# Patient Record
Sex: Female | Born: 1965 | Race: Black or African American | Hispanic: No | Marital: Married | Smoking: Current every day smoker
Health system: Southern US, Community
[De-identification: ages and names within clinical notes are randomized; demographics above are authoritative.]

## PROBLEM LIST (undated history)

## (undated) DIAGNOSIS — K760 Fatty (change of) liver, not elsewhere classified: Secondary | ICD-10-CM

## (undated) DIAGNOSIS — K429 Umbilical hernia without obstruction or gangrene: Secondary | ICD-10-CM

## (undated) DIAGNOSIS — J449 Chronic obstructive pulmonary disease, unspecified: Secondary | ICD-10-CM

## (undated) DIAGNOSIS — M1990 Unspecified osteoarthritis, unspecified site: Secondary | ICD-10-CM

## (undated) DIAGNOSIS — N179 Acute kidney failure, unspecified: Secondary | ICD-10-CM

## (undated) DIAGNOSIS — K219 Gastro-esophageal reflux disease without esophagitis: Secondary | ICD-10-CM

## (undated) DIAGNOSIS — E66813 Obesity, class 3: Secondary | ICD-10-CM

## (undated) DIAGNOSIS — K8050 Calculus of bile duct without cholangitis or cholecystitis without obstruction: Secondary | ICD-10-CM

## (undated) DIAGNOSIS — K5281 Eosinophilic gastritis or gastroenteritis: Secondary | ICD-10-CM

## (undated) DIAGNOSIS — D721 Eosinophilia: Principal | ICD-10-CM

## (undated) DIAGNOSIS — Z87440 Personal history of urinary (tract) infections: Secondary | ICD-10-CM

## (undated) DIAGNOSIS — D3500 Benign neoplasm of unspecified adrenal gland: Secondary | ICD-10-CM

## (undated) DIAGNOSIS — K2970 Gastritis, unspecified, without bleeding: Secondary | ICD-10-CM

## (undated) DIAGNOSIS — K5282 Eosinophilic colitis: Secondary | ICD-10-CM

## (undated) DIAGNOSIS — K269 Duodenal ulcer, unspecified as acute or chronic, without hemorrhage or perforation: Secondary | ICD-10-CM

## (undated) DIAGNOSIS — I10 Essential (primary) hypertension: Secondary | ICD-10-CM

## (undated) HISTORY — DX: Benign neoplasm of unspecified adrenal gland: D35.00

## (undated) HISTORY — DX: Duodenal ulcer, unspecified as acute or chronic, without hemorrhage or perforation: K26.9

## (undated) HISTORY — DX: Fatty (change of) liver, not elsewhere classified: K76.0

## (undated) HISTORY — DX: Gastro-esophageal reflux disease without esophagitis: K21.9

## (undated) HISTORY — DX: Eosinophilia: D72.1

## (undated) HISTORY — DX: Acute kidney failure, unspecified: N17.9

## (undated) HISTORY — DX: Gastritis, unspecified, without bleeding: K29.70

## (undated) HISTORY — DX: Personal history of urinary (tract) infections: Z87.440

## (undated) HISTORY — DX: Calculus of bile duct without cholangitis or cholecystitis without obstruction: K80.50

## (undated) HISTORY — DX: Eosinophilic colitis: K52.82

## (undated) HISTORY — DX: Eosinophilic gastritis or gastroenteritis: K52.81

## (undated) HISTORY — DX: Umbilical hernia without obstruction or gangrene: K42.9

## (undated) HISTORY — PX: ABDOMINAL HYSTERECTOMY: SHX81

---

## 1998-07-27 ENCOUNTER — Ambulatory Visit (HOSPITAL_COMMUNITY): Admission: RE | Admit: 1998-07-27 | Discharge: 1998-07-27 | Payer: Self-pay | Admitting: General Surgery

## 1998-07-27 ENCOUNTER — Encounter (HOSPITAL_BASED_OUTPATIENT_CLINIC_OR_DEPARTMENT_OTHER): Payer: Self-pay | Admitting: General Surgery

## 1998-08-24 ENCOUNTER — Other Ambulatory Visit: Admission: RE | Admit: 1998-08-24 | Discharge: 1998-08-24 | Payer: Self-pay | Admitting: Family Medicine

## 1998-09-01 ENCOUNTER — Ambulatory Visit (HOSPITAL_COMMUNITY): Admission: RE | Admit: 1998-09-01 | Discharge: 1998-09-01 | Payer: Self-pay | Admitting: Family Medicine

## 1998-09-02 ENCOUNTER — Encounter: Payer: Self-pay | Admitting: Family Medicine

## 1999-01-09 HISTORY — PX: OTHER SURGICAL HISTORY: SHX169

## 1999-01-09 HISTORY — PX: CHOLECYSTECTOMY: SHX55

## 1999-07-18 ENCOUNTER — Encounter: Admission: RE | Admit: 1999-07-18 | Discharge: 1999-07-18 | Payer: Self-pay | Admitting: Family Medicine

## 1999-07-18 ENCOUNTER — Encounter: Payer: Self-pay | Admitting: Family Medicine

## 1999-08-28 ENCOUNTER — Ambulatory Visit (HOSPITAL_COMMUNITY): Admission: RE | Admit: 1999-08-28 | Discharge: 1999-08-28 | Payer: Self-pay | Admitting: Family Medicine

## 1999-08-28 ENCOUNTER — Encounter: Payer: Self-pay | Admitting: Family Medicine

## 1999-09-05 ENCOUNTER — Observation Stay (HOSPITAL_COMMUNITY): Admission: RE | Admit: 1999-09-05 | Discharge: 1999-09-07 | Payer: Self-pay | Admitting: General Surgery

## 1999-09-05 ENCOUNTER — Encounter (INDEPENDENT_AMBULATORY_CARE_PROVIDER_SITE_OTHER): Payer: Self-pay

## 1999-09-05 ENCOUNTER — Encounter (HOSPITAL_BASED_OUTPATIENT_CLINIC_OR_DEPARTMENT_OTHER): Payer: Self-pay | Admitting: General Surgery

## 1999-09-06 ENCOUNTER — Encounter: Payer: Self-pay | Admitting: Gastroenterology

## 1999-09-06 ENCOUNTER — Encounter (HOSPITAL_BASED_OUTPATIENT_CLINIC_OR_DEPARTMENT_OTHER): Payer: Self-pay | Admitting: General Surgery

## 2000-05-10 ENCOUNTER — Emergency Department (HOSPITAL_COMMUNITY): Admission: EM | Admit: 2000-05-10 | Discharge: 2000-05-10 | Payer: Self-pay | Admitting: Emergency Medicine

## 2000-05-10 ENCOUNTER — Encounter: Payer: Self-pay | Admitting: Emergency Medicine

## 2003-05-16 ENCOUNTER — Emergency Department (HOSPITAL_COMMUNITY): Admission: EM | Admit: 2003-05-16 | Discharge: 2003-05-16 | Payer: Self-pay | Admitting: Emergency Medicine

## 2006-11-29 ENCOUNTER — Emergency Department (HOSPITAL_COMMUNITY): Admission: EM | Admit: 2006-11-29 | Discharge: 2006-11-29 | Payer: Self-pay | Admitting: Emergency Medicine

## 2009-01-04 ENCOUNTER — Encounter: Admission: RE | Admit: 2009-01-04 | Discharge: 2009-01-04 | Payer: Self-pay | Admitting: Internal Medicine

## 2009-01-06 ENCOUNTER — Encounter: Payer: Self-pay | Admitting: Emergency Medicine

## 2009-04-22 ENCOUNTER — Encounter: Payer: Self-pay | Admitting: Emergency Medicine

## 2009-05-08 HISTORY — PX: OTHER SURGICAL HISTORY: SHX169

## 2009-05-12 ENCOUNTER — Encounter: Payer: Self-pay | Admitting: Emergency Medicine

## 2009-05-18 ENCOUNTER — Ambulatory Visit: Payer: Self-pay | Admitting: Emergency Medicine

## 2009-05-18 DIAGNOSIS — I10 Essential (primary) hypertension: Secondary | ICD-10-CM | POA: Insufficient documentation

## 2009-05-18 DIAGNOSIS — R930 Abnormal findings on diagnostic imaging of skull and head, not elsewhere classified: Secondary | ICD-10-CM | POA: Insufficient documentation

## 2009-05-18 DIAGNOSIS — J45901 Unspecified asthma with (acute) exacerbation: Secondary | ICD-10-CM | POA: Insufficient documentation

## 2009-05-18 DIAGNOSIS — J45909 Unspecified asthma, uncomplicated: Secondary | ICD-10-CM | POA: Insufficient documentation

## 2009-05-18 HISTORY — DX: Unspecified asthma with (acute) exacerbation: J45.901

## 2009-05-20 ENCOUNTER — Ambulatory Visit: Payer: Self-pay | Admitting: Emergency Medicine

## 2009-05-20 ENCOUNTER — Ambulatory Visit: Admission: RE | Admit: 2009-05-20 | Discharge: 2009-05-20 | Payer: Self-pay | Admitting: Emergency Medicine

## 2009-05-26 ENCOUNTER — Ambulatory Visit
Admission: RE | Admit: 2009-05-26 | Discharge: 2009-05-26 | Payer: Self-pay | Source: Home / Self Care | Admitting: Emergency Medicine

## 2009-06-02 ENCOUNTER — Ambulatory Visit: Payer: Self-pay | Admitting: Emergency Medicine

## 2009-06-09 ENCOUNTER — Encounter: Payer: Self-pay | Admitting: Emergency Medicine

## 2009-06-20 ENCOUNTER — Telehealth: Payer: Self-pay | Admitting: Emergency Medicine

## 2009-07-04 ENCOUNTER — Ambulatory Visit: Payer: Self-pay | Admitting: Emergency Medicine

## 2009-07-14 ENCOUNTER — Ambulatory Visit: Payer: Self-pay | Admitting: Internal Medicine

## 2009-07-26 ENCOUNTER — Telehealth: Payer: Self-pay | Admitting: Emergency Medicine

## 2009-08-11 ENCOUNTER — Ambulatory Visit: Payer: Self-pay | Admitting: Emergency Medicine

## 2009-10-20 ENCOUNTER — Telehealth: Payer: Self-pay | Admitting: Emergency Medicine

## 2010-02-07 NOTE — Letter (Signed)
[facility]   Imported By: Sherian Rein 05/23/2009 13:54:50  _____________________________________________________________________  External Attachment:    Type:   Image     Comment:   External Document

## 2010-02-07 NOTE — Progress Notes (Signed)
Summary: returned call  Phone Note Call from Patient Call back at Home Phone [phone]   Caller: Patient Call For: rhonda Summary of Call: pt is returning call from rhonda. says that dr Lassie Demorest "should already have CT" at his office.  Initial call taken by: Tivis Ringer, CNA,  June 20, 2009 3:12 PM  Follow-up for Phone Call        pt states she brought CT Cd-rom to her consult appt on 05-18-09, RB does state he reviewed CT from 05-12-09. Pt states she never got the CD back. I ATC Iris in med records but did not get an answer unable to leave a message. WCB. If Iris does not have copy will have to request another copy of CT from [facility].   Still unable to get anybody in Med records to answer the phone so I called [facility] and requested a new copy be made of CD-ROm. Pt advised to go by and pick up copy before her CT appt on 07-14-09. Carron Curie CMA  June 20, 2009 3:51 PM

## 2010-02-07 NOTE — Progress Notes (Signed)
Summary: pt returniong call  Phone Note Outgoing Call Call back at Home Phone [phone]   Call placed by: Leslye Peer MD,  July 26, 2009 9:35 AM Call placed to: Patient Summary of Call: Called pt at home to discuss CT scan and plans for further eval, left message. Called at work - she was busy, will attempt to call her back. Leslye Peer MD  July 26, 2009 10:04 AM   call home number anytime but tomorrow if you call her at work, just let whoever answers know you are calling from doctors office and they will get her to the telephone. Initial call taken by: Eugene Gavia,  July 26, 2009 3:49 PM  Follow-up for Phone Call        Please insure that pt has f/u appt w me to discuss her CT and next steps in her care. Thanks. RSB Follow-up by: Leslye Peer MD,  August 05, 2009 12:04 PM  Additional Follow-up for Phone Call Additional follow up Details #1::        The patient is sch for f/u with you on Thurs., 08/11/09. Additional Follow-up by: Michel Bickers CMA,  August 05, 2009 12:29 PM

## 2010-02-07 NOTE — Assessment & Plan Note (Signed)
Summary: abnormal CT, asthma   Visit Type:  Initial Consult Copy to:  Dr. Fleet Contras Primary Provider/Referring Provider:  Dr. Fleet Contras  CC:  Pulmonary Consult for possible Sarcoidosis.Marland Kitchen  History of Present Illness: 45 yo woman, former smoker, Hx HTN, referred by Dr Concepcion Elk for dyspnea, frequent bronchitis and an abnormal CT scan of the chest (05/12/09).  Dx with asthma 2009 and treated with ProAir, also samples of Advair - not on this currently. Triggers have included fumes, sometimes activity.   She describes episodes of coughing, usually followed by dyspnea, wheezing.    CT scan from 5/5 reviewed: shows areas of scattered ground glass, mediastinal LAD, no discrete mass.   Preventive Screening-Counseling & Management  Alcohol-Tobacco     Smoking Status: quit  Current Medications (verified): 1)  Proair Hfa 108 (90 Base) Mcg/act Aers (Albuterol Sulfate) .... As Needed 2)  Naproxen 500 Mg Tabs (Naproxen) .Marland Kitchen.. 1 By Mouth Two Times A Day As Needed 3)  Diovan Hct 160-12.5 Mg Tabs (Valsartan-Hydrochlorothiazide) .... Once Daily 4)  Acetaminophen 650 Mg Supp (Acetaminophen) .... As Needed 5)  Vitamin B-12 500 Mcg Tabs (Cyanocobalamin) .... On Ocassion  Allergies (verified): No Known Drug Allergies  Past History:  Past Medical History: ASTHMA (ICD-493.90) HYPERTENSION (ICD-401.9)  Past Surgical History: Cholecystectomy Sept 2001 partial hysterectomy March 1997  Family History: asthma-mother  Social History: Married Patient states former smoker.  Quit March 2011.  1/2 -1 ppd x 53yrs 2 children Works in [employer]  Smoking Status:  quit  Review of Systems       The patient complains of shortness of breath with activity, shortness of breath at rest, productive cough, irregular heartbeats, sore throat, nasal congestion/difficulty breathing through nose, hand/feet swelling, joint stiffness or pain, and change in color of mucus.  The patient denies non-productive cough,  coughing up blood, chest pain, acid heartburn, indigestion, loss of appetite, weight change, abdominal pain, difficulty swallowing, tooth/dental problems, headaches, sneezing, itching, ear ache, anxiety, depression, rash, and fever.    Vital Signs:  Patient profile:   45 year old female Height:      66 inches Weight:      276 pounds BMI:     44.71 O2 Sat:      95 % on Room air Temp:     97.2 degrees F oral Pulse rate:   80 / minute BP sitting:   140 / 72  (right arm) Cuff size:   large  Vitals Entered By: Gweneth Dimitri RN (May 18, 2009 11:45 AM)  O2 Flow:  Room air CC: Pulmonary Consult for possible Sarcoidosis. Comments Medications reviewed with patient Daytime contact number verified with patient. Gweneth Dimitri RN  May 18, 2009 11:52 AM    Physical Exam  General:  Pleasant obese woman, NAD Head:  normocephalic and atraumatic Eyes:  conjunctiva and sclera clear Nose:  no deformity, discharge, inflammation, or lesions Mouth:  no deformity or lesions Neck:  no masses, thyromegaly, or abnormal cervical nodes Lungs:  clear bilaterally to auscultation and percussion Heart:  regular rhythm.  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  Ct scan appearance suggestive, as is recurrent asthma symptoms.  - discussed risks/benefits of FOB, elected to proceed on Friday 5/13. For TBBx's  Orders: Consultation Level IV (16109)  Problem # 2:  ASTHMA (ICD-493.90) - full PFTs  - continue ProAir for now, may decide to expand regimen depending on PFT's, Bx  results - ROV 2 weeks  Medications Added to Medication List This Visit: 1)  Proair Hfa 108 (90 Base) Mcg/act Aers (Albuterol sulfate) .... As needed 2)  Naproxen 500 Mg Tabs (Naproxen) .Marland Kitchen.. 1 by mouth two times a day as needed 3)  Diovan Hct 160-12.5 Mg Tabs (Valsartan-hydrochlorothiazide) .... Once daily 4)  Acetaminophen 650 Mg Supp (Acetaminophen) .... As needed 5)  Vitamin B-12 500 Mcg Tabs (Cyanocobalamin) .... On ocassion  Patient Instructions: 1)  We will perform full PFT's  2)  We will perform bronchoscopy on Friday May 13. Arrive at 11:45 to outpatient admitting at [facility]. Do not eat anything after a light breakfast at 6:00am 3)  Continue your ProAir 2 puffs as needed  4)  Follow up with Dr Delton Coombes in 2 weeks to review your studies.

## 2010-02-07 NOTE — Letter (Signed)
[facility]: [facility]   Imported By: Sherian Rein 05/23/2009 13:53:34  _____________________________________________________________________  External Attachment:    Type:   Image     Comment:   External Document

## 2010-02-07 NOTE — Progress Notes (Signed)
Summary: no show for CT Chest  Phone Note From Other Clinic   Caller: Rose with CT Dept [facility] Call For: Dr. Delton Coombes Summary of Call: [phone] Rose with our CT Dept called to inform you that pt was scheduled for repeat CT Chest today (10/20/09) and was a no show for this appt. Pt was given appt card with date, time and location at the time of her last ov. Rose also calls the day before to remind pt. Just wanted to let you know. Alfonso Ramus  October 20, 2009 12:14 PM   Follow-up for Phone Call        Needs to call her to try to reschedule, thanks Leslye Peer MD  October 21, 2009 10:45 AM  Follow-up by: Leslye Peer MD,  October 21, 2009 10:45 AM  Additional Follow-up for Phone Call Additional follow up Details #1::        lmtcb 10/31/09-11/01/09 and 11/02/09 never reached pt  Additional Follow-up by: Oneita Jolly,  November 01, 2009 12:42 PM

## 2010-02-07 NOTE — Assessment & Plan Note (Signed)
Summary: COPD, abnormal CT, ? sarcoidosis   Visit Type:  Follow-up Copy to:  Dr. Fleet Contras Primary Provider/Referring Provider:  Dr. Fleet Contras  CC:  Patient here for follow-up on bronchoscopy.  BP is elevated today at 180/70. Patient says she has not taken her BP med this morning.Alexandra Bell  History of Present Illness: 45 yo woman, former smoker, Hx HTN, referred by Dr Concepcion Elk for dyspnea, frequent bronchitis and an abnormal CT scan of the chest (05/12/09).  Dx with asthma 2009 and treated with ProAir, also samples of Advair - not on this currently. Triggers have included fumes, sometimes activity.   She describes episodes of coughing, usually followed by dyspnea, wheezing.    CT scan from 5/5 reviewed: shows areas of scattered ground glass, mediastinal LAD, no discrete mass.   ROV 06/02/09 -- returns for eval of asthma symptoms, abnormal CT scan chest. She is s/p FOB with wang needle and TBBx; shows normal epithelium, no sarcoidosis, no malignancy. Cx negative so far. had PFT's done at [facility] last week. She had some cough last week that responded to nebs, mucinex.   Preventive Screening-Counseling & Management  Alcohol-Tobacco     Smoking Status: current     Packs/Day: <0.25  Current Medications (verified): 1)  Proair Hfa 108 (90 Base) Mcg/act Aers (Albuterol Sulfate) .... As Needed 2)  Naproxen 500 Mg Tabs (Naproxen) .Alexandra Bell.. 1 By Mouth Two Times A Day As Needed 3)  Diovan Hct 160-12.5 Mg Tabs (Valsartan-Hydrochlorothiazide) .... Once Daily 4)  Acetaminophen 650 Mg Supp (Acetaminophen) .... As Needed 5)  Vitamin B-12 500 Mcg Tabs (Cyanocobalamin) .... On Ocassion  Allergies (verified): No Known Drug Allergies  Social History: Smoking Status:  current Packs/Day:  <0.25  Vital Signs:  Patient profile:   45 year old female Height:      66 inches (167.64 cm) Weight:      273 pounds (124.09 kg) BMI:     44.22 O2 Sat:      94 % on Room air Temp:     98.6 degrees F (37.00 degrees  C) oral Pulse rate:   88 / minute BP sitting:   180 / 70  (left arm) Cuff size:   large  Vitals Entered By: Michel Bickers CMA (Jun 02, 2009 11:11 AM)  O2 Sat at Rest %:  94 O2 Flow:  Room air CC: Patient here for follow-up on bronchoscopy.  BP is elevated today at 180/70. Patient says she has not taken her BP med this morning. Comments Medications reviewed. Daytime phone verified. Michel Bickers CMA  Jun 02, 2009 11:12 AM   Physical Exam  General:  Pleasant obese woman, NAD Head:  normocephalic and atraumatic Eyes:  conjunctiva and sclera clear Nose:  no deformity, discharge, inflammation, or lesions Mouth:  no deformity or lesions Neck:  no masses, thyromegaly, or abnormal cervical nodes Lungs:  clear bilaterally to auscultation and percussion Heart:  regular rhythm.  Early systolic M 3/6, intact S2 Abdomen:  not examined Msk:  no deformity or scoliosis noted with normal posture Extremities:  trace to 1+ ankle edema Neurologic:  non-focal Skin:  single dark, rasied papule on R hand Psych:  alert and cooperative; normal mood and affect; normal attention span and concentration   Impression & Recommendations:  Problem # 1:  COMPUTERIZED TOMOGRAPHY, CHEST, ABNORMAL (ICD-793.1) Still question sarcoidosis, even with negative TBBx. She has a R hand lesion and L facial lesions that could be sarcoid, may be appropriate to try to bx these  before going straight to surgical bx.  - referral to derm for eval of hand and face lesions.  - trial Symbicort; will need financial assist if we decide to order.  - as needed SABA - repeat Ct scan of the chest in July  - rov 3 weeks  Orders: Est. Patient Level IV (57846) Dermatology Referral [facility]) Radiology Referral (Radiology)  Problem # 2:  ASTHMA (ICD-493.90) PFT's done at [facility] show AFL without BD response, most consistent with COPD + restriction. - trial Symbicort  Patient Instructions: 1)  We will arrange for dermatology evaluation by  Dr Hoyle Sauer of your hand and facial lesions.  2)  Start a trial of Symbicort 2 puffs two times a day. We will order this medication if you find that it helps.  3)  Repeat your CT scan of the chest in the beginning of July 2011.  4)  Follow up with Dr Delton Coombes in 3 weeks to review.  5)  We will give you the pneumovax next visit.   Appended Document: COPD, abnormal CT, ? sarcoidosis Office note faxed to Dr. Hoyle Sauer at [phone].

## 2010-02-07 NOTE — Assessment & Plan Note (Signed)
Summary: abnormal CT   Visit Type:  Follow-up Copy to:  Dr. Fleet Contras Primary Provider/Referring Provider:  Dr. Fleet Contras  CC:  Patient here to discuss CT chest results and treatment plan. Alexandra Bell  History of Present Illness: 45 yo woman, former smoker, Hx HTN, referred by Dr Concepcion Elk for dyspnea, frequent bronchitis and an abnormal CT scan of the chest (05/12/09).  Dx with asthma 2009 and treated with ProAir, also samples of Advair - not on this currently. Triggers have included fumes, sometimes activity.   She describes episodes of coughing, usually followed by dyspnea, wheezing.    CT scan from 5/5 reviewed: shows areas of scattered ground glass, mediastinal LAD, no discrete mass.   ROV 06/02/09 -- returns for eval of asthma symptoms, abnormal CT scan chest. She is s/p FOB with wang needle and TBBx; shows normal epithelium, no sarcoidosis, no malignancy. Cx negative so far. had PFT's done at [facility] last week. She had some cough last week that responded to nebs, mucinex.   ROV 07/04/09 -- returns for eval of her AFL, abnormal CT scan. She underwent skin bx, didn't show sarcoidosis. She did a trial of Symbicort but it didn't seem to help her in any way. Still has ProAir available. Scheduled for repeat CT scan next week.   ROV 08/11/09 -- here to review CT scan from 07/14/09, as below. Still with some GGI, slightly different distribution. Feeling well, no complaints. Cx's from FOB all final and negative.   Current Medications (verified): 1)  Proair Hfa 108 (90 Base) Mcg/act Aers (Albuterol Sulfate) .... As Needed 2)  Tramadol Hcl 50 Mg Tabs (Tramadol Hcl) .Alexandra Bell.. 1 By Mouth Two Times A Day As Needed 3)  Acetaminophen 650 Mg Supp (Acetaminophen) .... As Needed 4)  Vitamin B-12 500 Mcg Tabs (Cyanocobalamin) .... On Ocassion  Allergies (verified): No Known Drug Allergies  Vital Signs:  Patient profile:   45 year old female Height:      66 inches (167.64 cm) Weight:      264 pounds (120.00  kg) BMI:     42.76 O2 Sat:      96 % on Room air Temp:     98.5 degrees F (36.94 degrees C) oral Pulse rate:   87 / minute BP sitting:   142 / 60  (left arm) Cuff size:   large  Vitals Entered By: Michel Bickers CMA (August 11, 2009 1:54 PM)  O2 Flow:  Room air CC: Patient here to discuss CT chest results and treatment plan.  Comments Medications reviewed. Daytime phone verified. Michel Bickers CMA  August 11, 2009 2:00 PM   Physical Exam  General:  Pleasant obese woman, NAD Head:  normocephalic and atraumatic Eyes:  conjunctiva and sclera clear Nose:  no deformity, discharge, inflammation, or lesions Mouth:  no deformity or lesions Neck:  no masses, thyromegaly, or abnormal cervical nodes Lungs:  clear bilaterally to auscultation and percussion Heart:  regular rhythm.  Early systolic M 3/6, intact S2 Abdomen:  not examined Msk:  no deformity or scoliosis noted with normal posture Extremities:  trace to 1+ ankle edema Neurologic:  non-focal Skin:  single dark, rasied papule on R hand Psych:  alert and cooperative; normal mood and affect; normal attention span and concentration   CT of Chest  Procedure date:  07/14/2009  Findings:      Comparison: Chest x-ray dated 05/20/2009   Findings: Diffuse ground glass opacities are seen throughout the lungs most confluent in  the right middle lobe.  No other airspace opacities or pulmonary nodules are seen. There are no pleural effusions. The heart is normal size.  A prominent 10 mm precarinal lymph node is seen.  No other enlarged lymph nodes are present. The visualized portion of thyroid is normal. The upper abdomen is within normal limits.  The patient has had a cholecystectomy. The osseous structures are unremarkable.   IMPRESSION: Multifocal ground-glass opacities throughout the lungs without other associated findings.  This is nonspecific but may relate to , atypical infection such as mycoplasma, hemorrhage,  hypersensitivity or other interstitial pneumonitis.  Impression & Recommendations:  Problem # 1:  COMPUTERIZED TOMOGRAPHY, CHEST, ABNORMAL (ICD-793.1) Ground glass remains present on 7/11 CT scan, different distribution. DDx still includes inflammatory processes esp sarcoidosis, ? infxn but no other symptoms.  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 Still raises question of sarcoidosis. No clinical response to Symbicort.  - continue ProAir as needed   Other Orders: Est. Patient Level IV (04540) Radiology Referral (Radiology)  Patient Instructions: 1)  We will repeat your CT scan of the chest in October 2011 2)  Continue to have your ProAir available to use as needed    Immunization History:  Influenza Immunization History:    Influenza:  historical (10/17/2008)

## 2010-02-07 NOTE — Assessment & Plan Note (Signed)
Summary: abnormal CT scan, AFL   Visit Type:  Follow-up Copy to:  Dr. Fleet Contras Primary Provider/Referring Provider:  Dr. Fleet Contras  CC:  Asthma.  The patient says her breathing has improved. she has been off Symbicort for 1 week and can tell no change.Marland Kitchen  History of Present Illness: 45 yo woman, former smoker, Hx HTN, referred by Dr Concepcion Elk for dyspnea, frequent bronchitis and an abnormal CT scan of the chest (05/12/09).  Dx with asthma 2009 and treated with ProAir, also samples of Advair - not on this currently. Triggers have included fumes, sometimes activity.   She describes episodes of coughing, usually followed by dyspnea, wheezing.    CT scan from 5/5 reviewed: shows areas of scattered ground glass, mediastinal LAD, no discrete mass.   ROV 06/02/09 -- returns for eval of asthma symptoms, abnormal CT scan chest. She is s/p FOB with wang needle and TBBx; shows normal epithelium, no sarcoidosis, no malignancy. Cx negative so far. had PFT's done at [facility] last week. She had some cough last week that responded to nebs, mucinex.   ROV 07/04/09 -- returns for eval of her AFL, abnormal CT scan. She underwent skin bx, didn't show sarcoidosis. She did a trial of Symbicort but it didn't seem to help her in any way. Still has ProAir available. Scheduled for repeat CT scan next week.   Current Medications (verified): 1)  Proair Hfa 108 (90 Base) Mcg/act Aers (Albuterol Sulfate) .... As Needed 2)  Tramadol Hcl 50 Mg Tabs (Tramadol Hcl) .Marland Kitchen.. 1 By Mouth Two Times A Day As Needed 3)  Diovan Hct 160-12.5 Mg Tabs (Valsartan-Hydrochlorothiazide) .... Once Daily 4)  Acetaminophen 650 Mg Supp (Acetaminophen) .... As Needed 5)  Vitamin B-12 500 Mcg Tabs (Cyanocobalamin) .... On Ocassion  Allergies (verified): No Known Drug Allergies  Past History:  Past Medical History: ASTHMA (ICD-493.90) HYPERTENSION (ICD-401.9) Abnormal CT scan of the chest 05/12/09:   - s/p FOB with TBBx's =  unrevealing  - s/p skin bx rash on R hand = unrevealing  Vital Signs:  Patient profile:   45 year old female Height:      66 inches (167.64 cm) Weight:      243 pounds (110.45 kg) BMI:     39.36 O2 Sat:      98 % on Room air Temp:     97.7 degrees F (36.50 degrees C) oral Pulse rate:   84 / minute BP sitting:   130 / 76  (right arm) Cuff size:   large  Vitals Entered By: Michel Bickers CMA (July 04, 2009 4:39 PM)  O2 Sat at Rest %:  98 O2 Flow:  Room air CC: Asthma.  The patient says her breathing has improved. she has been off Symbicort for 1 week and can tell no change. Comments Medications reviewed. Daytime phone verified. Michel Bickers CMA  July 04, 2009 4:46 PM   Physical Exam  General:  Pleasant obese woman, NAD Head:  normocephalic and atraumatic Eyes:  conjunctiva and sclera clear Nose:  no deformity, discharge, inflammation, or lesions Mouth:  no deformity or lesions Neck:  no masses, thyromegaly, or abnormal cervical nodes Lungs:  clear bilaterally to auscultation and percussion Heart:  regular rhythm.  Early systolic M 3/6, intact S2 Abdomen:  not examined Msk:  no deformity or scoliosis noted with normal posture Extremities:  trace to 1+ ankle edema Neurologic:  non-focal Psych:  alert and cooperative; normal mood and affect; normal attention span and concentration  Medications Added to Medication List This Visit: 1)  Tramadol Hcl 50 Mg Tabs (Tramadol hcl) .Marland Kitchen.. 1 by mouth two times a day as needed  Other Orders: Est. Patient Level IV (63149) Pneumococcal Vaccine (70263) Admin 1st Vaccine (78588) Admin 1st Vaccine [facility]) [phone])  Patient Instructions: 1)  Get your CT scan next week as planned.  2)  Dr Delton Coombes will call you with the results of your CT scan to decide our next steps. 3)  Stay off the Symbicort 4)  Keep ProAir available to use as needed  5)  Pneumovax today.    Pneumovax Vaccine    Vaccine Type: Pneumovax    Site: right deltoid    Mfr:  [employer]    Dose: 0.5 ml    Route: IM    Given by: Michel Bickers CMA    Exp. Date: 08/20/2010    Lot #: [id number]    VIS given: 08/06/95 version given July 04, 2009.

## 2010-02-07 NOTE — Consult Note (Signed)
Summary: Hoyle Sauer MD  Hoyle Sauer MD   Imported By: Sherian Rein 06/20/2009 10:21:52  _____________________________________________________________________  External Attachment:    Type:   Image     Comment:   External Document

## 2010-03-28 LAB — FUNGUS CULTURE W SMEAR: Fungal Smear: NONE SEEN

## 2010-03-28 LAB — CULTURE, RESPIRATORY W GRAM STAIN

## 2010-03-28 LAB — AFB CULTURE WITH SMEAR (NOT AT ARMC): Acid Fast Smear: NONE SEEN

## 2010-05-26 NOTE — Procedures (Signed)
[facility]  Patient:    Alexandra Bell, Alexandra Bell                     MRN: [id number] Proc. Date: 09/06/99 Adm. Date:  [id number] Disc. Date: [id number] Attending:  Sonda Primes                           Procedure Report  PROCEDURE:  Endoscopic retrograde cholangiopancreatography with endoscopic sphincterotomy and common bile duct stone removal.  ENDOSCOPIST:  Verlin Grills, M.D.  REFERRING PHYSICIAN:  Luisa Hart L. Lurene Shadow, M.D.  INDICATIONS FOR PROCEDURE:  The patient is a 45 year old female who underwent a laparoscopic cholecystectomy on September 05, 1999, to treat cholecystitis associated with gallstones.  The operative cholangiogram reveals a large common bile duct stone and slight intrahepatic ductal dilation.  I discussed ERCP, endoscopic sphincterotomy, and stone extraction risks with the patient and family including a 5.4% risk of pancreatitis, 2% risk of bleeding, 1% risk of infection, and 0.3% risk of intestinal rupture which would require emergency surgery.  The patient has signed the operative permit.  PREMEDICATION:  Fentanyl 100 mcg, Versed 14 mg, and Glucagon 0.5 mg.  ENDOSCOPE:  Olympus duodenoscope.  DESCRIPTION OF PROCEDURE:  After obtaining informed consent, the patient was placed in the left lateral decubitus position on the fluoroscopy table.  I administered intravenous fentanyl and intravenous Versed to attempt to achieve conscious sedation for the procedure.  Despite receiving 14 mg of Versed and 100 mcg of fentanyl, the patient was somewhat combative throughout the procedure.  The Olympus duodenoscope was passed through the posterior hypopharynx, down the esophagus into the proximal stomach without difficulty.  The pylorus was intubated and the endoscope advanced to the second portion of the duodenum.  Major papilla:  Endoscopically, the major papilla appears normal.  Pancreatic duct:  The pancreatic duct was  injected twice with intravenous contrast, but never completely filled.  The mid-distal pancreatic duct appeared normal.  Cholangiogram:  The sphincterotome was used to selectively cannulate the distal common bile duct.  The intrahepatic ducts and extrahepatic ducts were filled with contrast revealing multiple filling defects in the common bile duct compatible with stones.  An endoscopic sphincterotomy was performed. Multiple stones were removed using the 8 mm balloon catheter and the retrieval of basket.  The procedure was terminated before obtaining a final occlusion cholangiogram due to the patients combativeness and actually biting on the endoscope.  ASSESSMENT:  Multiple common bile duct stones removed after performing an endoscopic sphincterotomy.  The procedure was terminated before a final occlusion cholangiogram could be performed.  I cannot confirm clearance of the biliary tract of stones at this point.  RECOMMENDATIONS:  I will plan to repeat a cholangiogram tomorrow if necessary. I will need to observe the patient carefully for signs of pancreatitis and duodenal perforation secondary to the sphincterotomy. DD:  09/06/99 TD:  09/07/99 Job: [id number]

## 2010-05-26 NOTE — Op Note (Signed)
[facility]  Patient:    Alexandra Bell, Alexandra Bell                     MRN: [id number] Proc. Date: 09/05/99 Adm. Date:  [id number] Attending:  Sonda Primes                           Operative Report  PREOPERATIVE DIAGNOSES:  Chronic calculus cholecystitis.  POSTOPERATIVE DIAGNOSES:  Chronic calculus cholecystitis with choledocholithiasis.  PROCEDURE:  Laparoscopic cholecystectomy with intraoperative cholangiogram.  SURGEON:  Dr. Lurene Shadow.  ASSISTANT:  Dr. Wiliam Ke.  ANESTHESIA:  General.  HISTORY OF PRESENT ILLNESS:  This patient is a 45 year old woman who presented with persistent right upper quadrant pain involving fatty foods associated nausea and with vomiting. She comes to the operating room for laparoscopic cholecystectomy. It is noted that her bilirubin is within normal limits, so is her alkaline phosphatase. Her ALT and AST are moderately elevated. No leukocytosis or fever.  DESCRIPTION OF PROCEDURE:  Following the induction of satisfactory anesthesia with the patient positioned supinely, the abdomen was routinely prepped and draped to be included in the sterile operative field. Open laparoscopy was created at the umbilicus with insufflation of the peritoneal cavity to 14 mmHg pressure using carbon dioxide. Camera inserted and visual exploration of the abdomen carried out. The gallbladder was somewhat elongated and deeply intrahepatic with few adhesions to the gallbladder wall. The edge of the liver was smooth. The edge of the liver was sharp, the liver surface was smooth. The anterior gastric wall and duodenum appeared to be normal. There were multiple adhesions in the pelvis from previous surgery. There was no abnormality seen in any of the small or large intestine viewed. The pelvic organs were not visualized. Under direct vision, epigastric and lateral ports were placed and the gallbladder was grasped and retracted cephalad. Dissection  was carried down to the region of the ampulla of the gallbladder with isolation of a very large cystic duct and this was traced down to the cystic duct common duct junction and back up to the gallbladder cystic duct junction. We elected to take the gallbladder down from above so as to be sure that there was no unusual abnormality in the anatomy. This was carried out by then dissecting along the gallbladder and dissecting the gallbladder free from the liver bed, carrying the dissection down to the region of the cystic artery which was doubly clipped and transected. Again a very large cystic duct was noted. The cystic duct was opened and multiple large stones were mixed out of the cystic duct and removed from the operative field. We then inserted a Reddick catheter into the cystic duct through a 14 gauge Angiocath injecting 1/2 strength Hypaque into the biliary system. The system filled promptly with contrast. The right and left hepatic ducts appeared normal. The common bile duct appeared to be quite dilated and there were multiple filling defects in the common bile duct consistent with stones. We elected to not do a common duct exploration but to approach this through ERCP tomorrow.  The cystic duct catheter was then removed. The cystic duct was first closed with an endoloop and then with a clip. The cystic duct was then transected. The right upper quadrant was thoroughly irrigated with normal saline, all areas of dissection checked for hemostasis and noted to be dry. Sponge, instrument and sharp counts were verified and pneumoperitoneum allowed to deflate. The wounds were closed  in layers as follows: The umbilical wound closed in 2 layers with #0 Dexon and 4-0 Dexon, epigastric and lateral layers closed with 4-0 Dexon and all other wounds reinforced with Steri-Strips. A sterile dressing was applied. Anesthetic reversed, patient removed from the operating room to the recovery room in stable  condition. The patient tolerated the procedure well. DD:  09/05/99 TD:  09/06/99 Job: [id number]

## 2010-11-09 DIAGNOSIS — K5281 Eosinophilic gastritis or gastroenteritis: Secondary | ICD-10-CM

## 2010-11-09 DIAGNOSIS — K760 Fatty (change of) liver, not elsewhere classified: Secondary | ICD-10-CM

## 2010-11-09 HISTORY — DX: Eosinophilic gastritis or gastroenteritis: K52.81

## 2010-11-09 HISTORY — DX: Fatty (change of) liver, not elsewhere classified: K76.0

## 2010-11-27 ENCOUNTER — Inpatient Hospital Stay (HOSPITAL_COMMUNITY)
Admission: EM | Admit: 2010-11-27 | Discharge: 2010-12-01 | DRG: 683 | Disposition: A | Discharge status: Home / Self Care | Payer: PRIVATE HEALTH INSURANCE | Attending: Internal Medicine | Admitting: Internal Medicine

## 2010-11-27 ENCOUNTER — Emergency Department (HOSPITAL_COMMUNITY): Payer: PRIVATE HEALTH INSURANCE

## 2010-11-27 DIAGNOSIS — K859 Acute pancreatitis without necrosis or infection, unspecified: Secondary | ICD-10-CM

## 2010-11-27 DIAGNOSIS — I959 Hypotension, unspecified: Secondary | ICD-10-CM | POA: Diagnosis present

## 2010-11-27 DIAGNOSIS — J45901 Unspecified asthma with (acute) exacerbation: Secondary | ICD-10-CM | POA: Diagnosis not present

## 2010-11-27 DIAGNOSIS — E871 Hypo-osmolality and hyponatremia: Secondary | ICD-10-CM | POA: Diagnosis not present

## 2010-11-27 DIAGNOSIS — I10 Essential (primary) hypertension: Secondary | ICD-10-CM | POA: Diagnosis present

## 2010-11-27 DIAGNOSIS — R6510 Systemic inflammatory response syndrome (SIRS) of non-infectious origin without acute organ dysfunction: Secondary | ICD-10-CM | POA: Diagnosis present

## 2010-11-27 DIAGNOSIS — N390 Urinary tract infection, site not specified: Secondary | ICD-10-CM | POA: Diagnosis present

## 2010-11-27 DIAGNOSIS — N179 Acute kidney failure, unspecified: Principal | ICD-10-CM | POA: Diagnosis present

## 2010-11-27 DIAGNOSIS — D72829 Elevated white blood cell count, unspecified: Secondary | ICD-10-CM | POA: Diagnosis present

## 2010-11-27 DIAGNOSIS — K5281 Eosinophilic gastritis or gastroenteritis: Secondary | ICD-10-CM | POA: Diagnosis present

## 2010-11-27 DIAGNOSIS — R112 Nausea with vomiting, unspecified: Secondary | ICD-10-CM | POA: Diagnosis present

## 2010-11-27 DIAGNOSIS — N289 Disorder of kidney and ureter, unspecified: Secondary | ICD-10-CM

## 2010-11-27 DIAGNOSIS — E860 Dehydration: Secondary | ICD-10-CM | POA: Diagnosis present

## 2010-11-27 DIAGNOSIS — R197 Diarrhea, unspecified: Secondary | ICD-10-CM

## 2010-11-27 DIAGNOSIS — Z7982 Long term (current) use of aspirin: Secondary | ICD-10-CM

## 2010-11-27 DIAGNOSIS — E861 Hypovolemia: Secondary | ICD-10-CM

## 2010-11-27 DIAGNOSIS — J45909 Unspecified asthma, uncomplicated: Secondary | ICD-10-CM | POA: Diagnosis present

## 2010-11-27 DIAGNOSIS — E876 Hypokalemia: Secondary | ICD-10-CM | POA: Diagnosis not present

## 2010-11-27 HISTORY — DX: Essential (primary) hypertension: I10

## 2010-11-27 HISTORY — DX: Acute kidney failure, unspecified: N17.9

## 2010-11-27 HISTORY — DX: Morbid (severe) obesity due to excess calories: E66.01

## 2010-11-27 HISTORY — DX: Obesity, class 3: E66.813

## 2010-11-27 LAB — DIFFERENTIAL
Basophils Absolute: 0 10*3/uL (ref 0.0–0.1)
Basophils Relative: 0 % (ref 0–1)
Eosinophils Absolute: 1 10*3/uL — ABNORMAL HIGH (ref 0.0–0.7)
Eosinophils Relative: 5 % (ref 0–5)
Lymphocytes Relative: 9 % — ABNORMAL LOW (ref 12–46)
Lymphs Abs: 1.8 10*3/uL (ref 0.7–4.0)
Monocytes Absolute: 1.2 10*3/uL — ABNORMAL HIGH (ref 0.1–1.0)
Monocytes Relative: 6 % (ref 3–12)
Neutro Abs: 16.2 10*3/uL — ABNORMAL HIGH (ref 1.7–7.7)
Neutrophils Relative %: 80 % — ABNORMAL HIGH (ref 43–77)

## 2010-11-27 LAB — CBC
HCT: 48.4 % — ABNORMAL HIGH (ref 36.0–46.0)
HCT: 46.3 % — ABNORMAL HIGH (ref 36.0–46.0)
Hemoglobin: 17.1 g/dL — ABNORMAL HIGH (ref 12.0–15.0)
Hemoglobin: 16.7 g/dL — ABNORMAL HIGH (ref 12.0–15.0)
MCH: 31.1 pg (ref 26.0–34.0)
MCH: 31.3 pg (ref 26.0–34.0)
MCHC: 35.3 g/dL (ref 30.0–36.0)
MCHC: 36.1 g/dL — ABNORMAL HIGH (ref 30.0–36.0)
MCV: 88.2 fL (ref 78.0–100.0)
MCV: 86.7 fL (ref 78.0–100.0)
Platelets: 286 10*3/uL (ref 150–400)
Platelets: 325 10*3/uL (ref 150–400)
RBC: 5.49 MIL/uL — ABNORMAL HIGH (ref 3.87–5.11)
RBC: 5.34 MIL/uL — ABNORMAL HIGH (ref 3.87–5.11)
RDW: 13 % (ref 11.5–15.5)
RDW: 13.1 % (ref 11.5–15.5)
WBC: 20.2 10*3/uL — ABNORMAL HIGH (ref 4.0–10.5)
WBC: 21.4 10*3/uL — ABNORMAL HIGH (ref 4.0–10.5)

## 2010-11-27 LAB — COMPREHENSIVE METABOLIC PANEL
ALT: 27 U/L (ref 0–35)
AST: 23 U/L (ref 0–37)
Albumin: 3.6 g/dL (ref 3.5–5.2)
Alkaline Phosphatase: 97 U/L (ref 39–117)
BUN: 34 mg/dL — ABNORMAL HIGH (ref 6–23)
CO2: 21 mEq/L (ref 19–32)
Calcium: 10.1 mg/dL (ref 8.4–10.5)
Chloride: 95 mEq/L — ABNORMAL LOW (ref 96–112)
Creatinine, Ser: 2.92 mg/dL — ABNORMAL HIGH (ref 0.50–1.10)
GFR calc Af Amer: 21 mL/min — ABNORMAL LOW (ref >90)
GFR calc non Af Amer: 18 mL/min — ABNORMAL LOW (ref >90)
Glucose, Bld: 97 mg/dL (ref 70–99)
Potassium: 3.6 mEq/L (ref 3.5–5.1)
Sodium: 136 mEq/L (ref 135–145)
Total Bilirubin: 0.4 mg/dL (ref 0.3–1.2)
Total Protein: 8.5 g/dL — ABNORMAL HIGH (ref 6.0–8.3)

## 2010-11-27 LAB — CLOSTRIDIUM DIFFICILE BY PCR: Toxigenic C. Difficile by PCR: NEGATIVE

## 2010-11-27 LAB — LIPASE, BLOOD: Lipase: 81 U/L — ABNORMAL HIGH (ref 11–59)

## 2010-11-27 LAB — CREATININE, SERUM
Creatinine, Ser: 3.2 mg/dL — ABNORMAL HIGH (ref 0.50–1.10)
GFR calc Af Amer: 19 mL/min — ABNORMAL LOW (ref >90)
GFR calc non Af Amer: 16 mL/min — ABNORMAL LOW (ref >90)

## 2010-11-27 MED ORDER — FENTANYL CITRATE 0.05 MG/ML IJ SOLN
50.0000 ug | Freq: Once | INTRAMUSCULAR | Status: AC
  Administered 2010-11-27: 50 ug via INTRAVENOUS
  Filled 2010-11-27: qty 2

## 2010-11-27 MED ORDER — SODIUM CHLORIDE 0.9 % IV BOLUS (SEPSIS)
1000.0000 mL | Freq: Once | INTRAVENOUS | Status: AC
  Administered 2010-11-27: 1000 mL via INTRAVENOUS

## 2010-11-27 MED ORDER — ENOXAPARIN SODIUM 40 MG/0.4ML ~~LOC~~ SOLN
40.0000 mg | SUBCUTANEOUS | Status: DC
  Administered 2010-11-27 – 2010-11-30 (×4): 40 mg via SUBCUTANEOUS
  Filled 2010-11-27 (×5): qty 0.4

## 2010-11-27 MED ORDER — ONDANSETRON HCL 4 MG PO TABS: 4.0000 mg | ORAL_TABLET | Freq: Four times a day (QID) | ORAL | Status: DC | PRN

## 2010-11-27 MED ORDER — CIPROFLOXACIN HCL 500 MG PO TABS
500.0000 mg | ORAL_TABLET | Freq: Two times a day (BID) | ORAL | Status: DC
  Administered 2010-11-27: 500 mg via ORAL
  Filled 2010-11-27 (×5): qty 1

## 2010-11-27 MED ORDER — SODIUM CHLORIDE 0.9 % IV SOLN
INTRAVENOUS | Status: DC
  Administered 2010-11-27: 17:00:00 via INTRAVENOUS

## 2010-11-27 MED ORDER — ACETAMINOPHEN 325 MG PO TABS
325.0000 mg | ORAL_TABLET | Freq: Four times a day (QID) | ORAL | Status: DC | PRN
  Administered 2010-11-27 – 2010-11-29 (×4): 325 mg via ORAL
  Filled 2010-11-27 (×2): qty 1
  Filled 2010-11-27: qty 2
  Filled 2010-11-27: qty 1

## 2010-11-27 MED ORDER — SODIUM CHLORIDE 0.9 % IV SOLN
INTRAVENOUS | Status: DC
  Administered 2010-11-28 (×3): via INTRAVENOUS

## 2010-11-27 MED ORDER — ONDANSETRON HCL 4 MG/2ML IJ SOLN: 4.0000 mg | Freq: Four times a day (QID) | INTRAMUSCULAR | Status: DC | PRN

## 2010-11-27 MED ORDER — ONDANSETRON HCL 4 MG/2ML IJ SOLN
4.0000 mg | Freq: Once | INTRAMUSCULAR | Status: AC
  Administered 2010-11-27: 4 mg via INTRAVENOUS
  Filled 2010-11-27: qty 2

## 2010-11-27 MED ORDER — METRONIDAZOLE IN NACL 5-0.79 MG/ML-% IV SOLN
500.0000 mg | Freq: Three times a day (TID) | INTRAVENOUS | Status: DC
  Administered 2010-11-27: 500 mg via INTRAVENOUS
  Filled 2010-11-27 (×2): qty 100

## 2010-11-27 MED ORDER — ALBUTEROL SULFATE HFA 108 (90 BASE) MCG/ACT IN AERS
2.0000 | INHALATION_SPRAY | Freq: Four times a day (QID) | RESPIRATORY_TRACT | Status: DC | PRN
  Administered 2010-11-29: 2 via RESPIRATORY_TRACT
  Filled 2010-11-27: qty 6.7

## 2010-11-27 NOTE — ED Notes (Signed)
This patient is unable to urinate at this time.

## 2010-11-27 NOTE — ED Provider Notes (Signed)
History     CSN: [id number] Arrival date & time: 11/27/2010 10:08 AM   First MD Initiated Contact with Patient 11/27/10 1012      Chief Complaint  Patient presents with  . Nausea  . Emesis  . Diarrhea    (Consider location/radiation/quality/duration/timing/severity/associated sxs/prior treatment) Patient is a 45 y.o. female presenting with vomiting and diarrhea. The history is provided by the patient.  Emesis  Associated symptoms include abdominal pain, chills and diarrhea. Pertinent negatives include no headaches.  Diarrhea The primary symptoms include fatigue, abdominal pain, nausea, vomiting and diarrhea. Primary symptoms do not include rash.  The illness is also significant for chills. The illness does not include back pain.   patient has had nausea vomiting Friday. She saw her primary care doctor who started her on Cipro for urinary tract infection and Lomotil for the diarrhea. She states it has not gotten any better. She now has diarrhea also. She states that she's been a little dizzy when she tried to stand. She has diffuse abdominal pain. No fevers, but she has had some chills. She states that she does not have pain with urination. She's been having green diarrhea. She's not been on antibiotics recently before the Cipro. No recent travel.  Past Medical History  Diagnosis Date  . Hypertension   . Asthma     No past surgical history on file.  No family history on file.  History  Substance Use Topics  . Smoking status: Not on file  . Smokeless tobacco: Not on file  . Alcohol Use:     OB History    Grav Para Term Preterm Abortions TAB SAB Ect Mult Living                  Review of Systems  Constitutional: Positive for chills, appetite change and fatigue. Negative for activity change.  HENT: Negative for neck stiffness.   Eyes: Negative for pain.  Respiratory: Negative for chest tightness and shortness of breath.   Cardiovascular: Negative for chest pain and  leg swelling.  Gastrointestinal: Positive for nausea, vomiting, abdominal pain and diarrhea. Negative for blood in stool.  Genitourinary: Negative for urgency and flank pain.  Musculoskeletal: Negative for back pain.  Skin: Negative for rash.  Neurological: Positive for light-headedness. Negative for weakness, numbness and headaches.  Psychiatric/Behavioral: Negative for behavioral problems.    Allergies  Review of patient's allergies indicates no known allergies.  Home Medications   Current Outpatient Rx  Name Route Sig Dispense Refill  . ACETAMINOPHEN 500 MG PO TABS Oral Take 1,500 mg by mouth every 6 (six) hours as needed. For pain     . ALBUTEROL SULFATE HFA 108 (90 BASE) MCG/ACT IN AERS Inhalation Inhale 2 puffs into the lungs every 6 (six) hours as needed. For asthma     . ASPIRIN EC 81 MG PO TBEC Oral Take 81 mg by mouth daily as needed. For pain     . CIPROFLOXACIN HCL 250 MG PO TABS Oral Take 250 mg by mouth 2 (two) times daily.      Marland Kitchen DIPHENOXYLATE-ATROPINE 2.5-0.025 MG PO TABS Oral Take 1 tablet by mouth 4 (four) times daily as needed. For diarrhea    . OMEGA-3 FATTY ACIDS 1000 MG PO CAPS Oral Take 2 g by mouth daily.      Marland Kitchen LISINOPRIL-HYDROCHLOROTHIAZIDE 20-25 MG PO TABS Oral Take 1 tablet by mouth daily.      Marland Kitchen POTASSIUM PO Oral Take 1 tablet by mouth every evening.  BP 99/60  Pulse 78  Temp(Src) 97.5 F (36.4 C) (Oral)  Resp 18  SpO2 96%  Physical Exam  Nursing note and vitals reviewed. Constitutional: She is oriented to person, place, and time. She appears well-developed and well-nourished.       Patient is obese  HENT:  Head: Normocephalic and atraumatic.  Eyes: EOM are normal. Pupils are equal, round, and reactive to light.  Neck: Normal range of motion. Neck supple.  Cardiovascular: Normal rate, regular rhythm and normal heart sounds.   No murmur heard. Pulmonary/Chest: Effort normal and breath sounds normal. No respiratory distress. She has no  wheezes. She has no rales.  Abdominal: Soft. Bowel sounds are normal. She exhibits no distension. There is no tenderness. There is no rebound and no guarding.  Musculoskeletal: Normal range of motion.  Neurological: She is alert and oriented to person, place, and time. No cranial nerve deficit.  Skin: Skin is warm and dry.  Psychiatric: She has a normal mood and affect. Her speech is normal.    ED Course  Procedures (including critical care time)  Labs Reviewed  CBC - Abnormal; Notable for the following:    WBC 20.2 (*)    RBC 5.49 (*)    Hemoglobin 17.1 (*)    HCT 48.4 (*)    All other components within normal limits  DIFFERENTIAL - Abnormal; Notable for the following:    Neutrophils Relative 80 (*)    Lymphocytes Relative 9 (*)    Neutro Abs 16.2 (*)    Monocytes Absolute 1.2 (*)    Eosinophils Absolute 1.0 (*)    All other components within normal limits  COMPREHENSIVE METABOLIC PANEL - Abnormal; Notable for the following:    Chloride 95 (*)    BUN 34 (*)    Creatinine, Ser 2.92 (*)    Total Protein 8.5 (*)    GFR calc non Af Amer 18 (*)    GFR calc Af Amer 21 (*)    All other components within normal limits  LIPASE, BLOOD - Abnormal; Notable for the following:    Lipase 81 (*)    All other components within normal limits  URINALYSIS, ROUTINE W REFLEX MICROSCOPIC  PREGNANCY, URINE   Dg Abd Acute W/chest  11/27/2010  *RADIOLOGY REPORT*  Clinical Data: Abdominal pain with nausea and vomiting.  ACUTE ABDOMEN SERIES (ABDOMEN 2 VIEW & CHEST 1 VIEW)  Comparison: Chest x-ray dated 05/20/2009  Findings: Heart and lungs are normal.  No free air or free fluid in the abdomen.  Air scattered throughout nondistended loops of large and small bowel with fluid levels in the large and small bowel suggesting gastroenteritis.  Evidence of previous cholecystectomy.  No acute osseous abnormality.  IMPRESSION: There are fluid levels scattered throughout nondistended large and small bowel  suggestive of gastroenteritis.  Original Report Authenticated By: Gwynn Burly, M.D.     1. Nausea vomiting and diarrhea   2. Renal insufficiency   3. Pancreatitis       MDM  Nausea vomiting diarrhea the last few days. Patient has elevated lipase. Her white count is elevated and she has renal insufficiency. She was given antibiotics by her primary care doctor. She now has renal insufficiency so I will not get a contrast CT at this time. She'll be admitted to [facility].        Juliet Rude. Rubin Payor, MD 11/27/10 1406

## 2010-11-27 NOTE — Progress Notes (Signed)
Pt's blood pressure 76/42 and heart 93 on telemetry, MD aware, orders received, pt complaining of "not feeling well" and short of breath.  Await evaluation of critical care.  Laney Potash Terral 11/27/2010

## 2010-11-27 NOTE — ED Notes (Signed)
Received patient from [facility] with c/o N/D, received with 20g to RAC,Hep lock, pt in stable condition, pending admission

## 2010-11-27 NOTE — Consult Note (Signed)
Name: Alexandra Bell MRN: [id number] DOB: [birth year]  DOS:   11/27/2010    CRITICAL CARE MEDICINE ADMISSION / CONSULTATION NOTE  Referring Physician  Lama ([facility])  Reason For Consult  Hypotension    Patient Description  45 y/o female presented to the ED with 4-5 days of nearly continuous watery, non bloody diarrhea, subjective fever and leukocytosis.  We were consulted for hypotension on the floor.    Location Start Stop        Culture Date Result  Blood cx x2 11/19    Antibiotic Indication Start Stop  cipro Infectious diarrhea/uti 11/19    GI Prophylaxis DVT Prophylaxis  Not indicated none   Protocols     Consultants       Date Imaging Studies  11/19 KUB: dilated loops of bowel consistent with gastroenteritis   Date Events        History Of Present Illness   45 y/o female with hypertension and asthma presented to the [facility] ED on 11/19 with 4-5 days of diarrhea.  The diarrhea was watery, non bloody, and nearly continuous for 4-5 days.  She noted nausea and vomiting today.  She had subjective fevers at home.  She saw her pcp on 11/16 and was given Cipro for a possible UTI.  She denies urinary complaints now.   In the ED she was found to be hypotensive and in acute renal failure so she was admitted to [facility].  Throughout the day she has only received about 2 liters of fluid documented.  She was receiving a third liter upon my arrival.  She was comfortable, laughing with her family and starting to make some urine. She did note a diffuse cramping abdominal pain.  Past Medical History  Diagnosis Date  . Hypertension   . Asthma     Past Surgical History  Procedure Date  . Abdominal hysterectomy     Prior to Admission medications   Medication Sig Start Date End Date Taking? Authorizing Provider  acetaminophen (TYLENOL) 500 MG tablet Take 1,500 mg by mouth every 6 (six) hours as needed. For pain   Yes Historical Provider, MD  albuterol (PROVENTIL HFA;VENTOLIN HFA) 108  (90 BASE) MCG/ACT inhaler Inhale 2 puffs into the lungs every 6 (six) hours as needed. For asthma    Yes Historical Provider, MD  aspirin EC 81 MG tablet Take 81 mg by mouth daily as needed. For pain    Yes Historical Provider, MD  ciprofloxacin (CIPRO) 250 MG tablet Take 250 mg by mouth 2 (two) times daily.     Yes Historical Provider, MD  diphenoxylate-atropine (LOMOTIL) 2.5-0.025 MG per tablet Take 1 tablet by mouth 4 (four) times daily as needed. For diarrhea   Yes Historical Provider, MD  fish oil-omega-3 fatty acids 1000 MG capsule Take 2 g by mouth daily.     Yes Historical Provider, MD  lisinopril-hydrochlorothiazide (PRINZIDE,ZESTORETIC) 20-25 MG per tablet Take 1 tablet by mouth daily.     Yes Historical Provider, MD  POTASSIUM PO Take 1 tablet by mouth every evening.     Yes Historical Provider, MD    No Known Allergies  No family history on file.  Social History  reports that she has been smoking Cigarettes.  She has a 45 pack-year smoking history. She does not have any smokeless tobacco history on file. She reports that she does not drink alcohol or use illicit drugs.  Review Of Systems    Gen: She notes subjective fevers and chills, denies weight  change, fatigue, night sweats HEENT: Denies blurred vision, double vision, hearing loss, tinnitus, sinus congestion, rhinorrhea, sore throat, neck stiffness, dysphagia PULM: Denies shortness of breath, cough, sputum production, hemoptysis, wheezing CV: Denies chest pain, edema, orthopnea, paroxysmal nocturnal dyspnea, palpitations GI: She notes diffuse cramping abdominal pain, nausea, vomiting, diarrhea, but denies hematochezia, melena, constipation, or other change in bowel habits GU: Denies dysuria, hematuria, polyuria, oliguria, urethral discharge Endocrine: Denies hot or cold intolerance, polyuria, polyphagia or appetite change Derm: Denies rash, dry skin, scaling or peeling skin change Heme: Denies easy bruising, bleeding,  bleeding gums Neuro: Denies headache, numbness, weakness, slurred speech, loss of memory or conciousness  Physical Examination  BP 90/43  Pulse 95  Temp(Src) 98.3 F (36.8 C) (Oral)  Resp 18  Ht 5\' 6"  (1.676 m)  Wt 116.8 kg (257 lb 8 oz)  BMI 41.56 kg/m2  SpO2 97%  Gen: well appearing, no acute distress HEENT: NCAT, PERRL, EOMi, OP clear, neck supple without masses, MM dry PULM: CTA B CV: RRR, no mgr, no JVD AB: BS hyperactive, soft, mildly tender throughout, no guarding or rebound, no hsm Ext: warm, no edema, no clubbing, no cyanosis Derm: no rash or skin breakdown Neuro: A&Ox4, CN II-XII intact, strength 5/5 in all 4 extremeties  Labs  Results for orders placed during the hospital encounter of 11/27/10 (from the past 24 hour(s))  CBC     Status: Abnormal   Collection Time   11/27/10 11:44 AM      Component Value Range   WBC 20.2 (*) 4.0 - 10.5 (K/uL)   RBC 5.49 (*) 3.87 - 5.11 (MIL/uL)   Hemoglobin 17.1 (*) 12.0 - 15.0 (g/dL)   HCT 56.2 (*) 13.0 - 46.0 (%)   MCV 88.2  78.0 - 100.0 (fL)   MCH 31.1  26.0 - 34.0 (pg)   MCHC 35.3  30.0 - 36.0 (g/dL)   RDW 86.5  78.4 - 69.6 (%)   Platelets 286  150 - 400 (K/uL)  DIFFERENTIAL     Status: Abnormal   Collection Time   11/27/10 11:44 AM      Component Value Range   Neutrophils Relative 80 (*) 43 - 77 (%)   Lymphocytes Relative 9 (*) 12 - 46 (%)   Monocytes Relative 6  3 - 12 (%)   Eosinophils Relative 5  0 - 5 (%)   Basophils Relative 0  0 - 1 (%)   Neutro Abs 16.2 (*) 1.7 - 7.7 (K/uL)   Lymphs Abs 1.8  0.7 - 4.0 (K/uL)   Monocytes Absolute 1.2 (*) 0.1 - 1.0 (K/uL)   Eosinophils Absolute 1.0 (*) 0.0 - 0.7 (K/uL)   Basophils Absolute 0.0  0.0 - 0.1 (K/uL)  COMPREHENSIVE METABOLIC PANEL     Status: Abnormal   Collection Time   11/27/10 11:44 AM      Component Value Range   Sodium 136  135 - 145 (mEq/L)   Potassium 3.6  3.5 - 5.1 (mEq/L)   Chloride 95 (*) 96 - 112 (mEq/L)   CO2 21  19 - 32 (mEq/L)   Glucose, Bld 97   70 - 99 (mg/dL)   BUN 34 (*) 6 - 23 (mg/dL)   Creatinine, Ser 2.95 (*) 0.50 - 1.10 (mg/dL)   Calcium 28.4  8.4 - 10.5 (mg/dL)   Total Protein 8.5 (*) 6.0 - 8.3 (g/dL)   Albumin 3.6  3.5 - 5.2 (g/dL)   AST 23  0 - 37 (U/L)   ALT 27  0 - 35 (U/L)   Alkaline Phosphatase 97  39 - 117 (U/L)   Total Bilirubin 0.4  0.3 - 1.2 (mg/dL)   GFR calc non Af Amer 18 (*) >90 (mL/min)   GFR calc Af Amer 21 (*) >90 (mL/min)  LIPASE, BLOOD     Status: Abnormal   Collection Time   11/27/10 11:44 AM      Component Value Range   Lipase 81 (*) 11 - 59 (U/L)  CBC     Status: Abnormal   Collection Time   11/27/10  4:54 PM      Component Value Range   WBC 21.4 (*) 4.0 - 10.5 (K/uL)   RBC 5.34 (*) 3.87 - 5.11 (MIL/uL)   Hemoglobin 16.7 (*) 12.0 - 15.0 (g/dL)   HCT 13.0 (*) 86.5 - 46.0 (%)   MCV 86.7  78.0 - 100.0 (fL)   MCH 31.3  26.0 - 34.0 (pg)   MCHC 36.1 (*) 30.0 - 36.0 (g/dL)   RDW 78.4  69.6 - 29.5 (%)   Platelets 325  150 - 400 (K/uL)  CREATININE, SERUM     Status: Abnormal   Collection Time   11/27/10  4:54 PM      Component Value Range   Creatinine, Ser 3.20 (*) 0.50 - 1.10 (mg/dL)   GFR calc non Af Amer 16 (*) >90 (mL/min)   GFR calc Af Amer 19 (*) >90 (mL/min)  CLOSTRIDIUM DIFFICILE BY PCR     Status: Normal   Collection Time   11/27/10  5:26 PM      Component Value Range   C difficile by pcr NEGATIVE  NEGATIVE     Imaging  Dg Abd Acute W/chest  11/27/2010  *RADIOLOGY REPORT*  Clinical Data: Abdominal pain with nausea and vomiting.  ACUTE ABDOMEN SERIES (ABDOMEN 2 VIEW & CHEST 1 VIEW)  Comparison: Chest x-ray dated 05/20/2009  Findings: Heart and lungs are normal.  No free air or free fluid in the abdomen.  Air scattered throughout nondistended loops of large and small bowel with fluid levels in the large and small bowel suggesting gastroenteritis.  Evidence of previous cholecystectomy.  No acute osseous abnormality.  IMPRESSION: There are fluid levels scattered throughout  nondistended large and small bowel suggestive of gastroenteritis.  Original Report Authenticated By: Gwynn Burly, M.D.    Assessment Patient Active Hospital Problem List: Hypotension (11/27/2010)   Assessment: Due to hypovolemia from days of diarrhea, nausea, vomiting, and poor po intake.  I do not think that she is septic as she is not febrile, and not tachycardic or tachypnic.  Her hypotension and elevated WBC are consistent either with viral gastroenteritis or infectious diarrhea.   Plan:  -continue aggressive fluid resuscitation, bolusing normal saline one liter at a time -Does not need pressors considering good mentation, starting to make urine -step down unit appropriate  Acute renal failure (11/27/2010)   Assessment: due to hypotension/volume depletion   Plan:  -follow uop -IVF resuscitation  Diarrhea (11/27/2010)   Assessment: likely viral in origin, but considering duration, severity would treat for infectious diarrhea with cipro   Plan:  -stop flagyl (c.diff negative) -start cipro 500 mg po bid  UTI (lower urinary tract infection) (11/27/2010)   Assessment: questionable, currently asymptomatic   Plan:  -monitor for symptoms of UTI   Best Practice Feeding: npo Analgesia: n/a Sedation: n/a  Thromboprophylaxis: lovenox HOB >30 degrees:  Ulcer prophylaxis: n/a Glucose control: Cari Caraway, MD 11/27/2010, 8:59 PM

## 2010-11-27 NOTE — ED Notes (Signed)
Patient presents with n/v/d since this past Wednesday, saw PCP given antibiotics.  Patient now has ST elevation in V1 lead.  Denies chest pain and shortness of breath. Patient also reporting LLQ abdominal pain.

## 2010-11-27 NOTE — ED Notes (Signed)
Patient states headache throbbing Doctor notified.

## 2010-11-27 NOTE — ED Notes (Signed)
Onset 5 days ago diarrhea and today nausea and emesis.  Abdomen distended soft with general abdominal pain 1/10 achy.  Airway intact bilateral equal chest rise and fall.  Patient had bowel movement with bedpan liquid green no odor.

## 2010-11-27 NOTE — ED Notes (Signed)
Upon arrival to ED stated need to have a bowel movement denies chest pain or shortness of breath. EKG shown to Doctor able to use bathroom in facility.  Patient notified and added vision is blurry is that normal? Asked if she normally has blurry vision stated no.  Notified Doctor and placed patient on bedpan with nurse tech. Family member at bedside

## 2010-11-27 NOTE — Progress Notes (Signed)
Pt. Transferred to [address] [facility] 11/27/2010

## 2010-11-27 NOTE — H&P (Addendum)
PCP:  Dr Ginette Otto  Chief Complaint:  Diarrhea  HPI: 45 y/o female who came to the hospital with worsening diarrhea, she started having diarrhea last week on Wednesday, patient saw her pcp on Friday, she was prescribed cipro for the UTI. Patient says she continued to have diarrhea, and also started having nausea, vomiting this morning. She also had shortness of breath , which improved after taking albuterol inhaler. Patient denies any chest pain she admits to having abdominal pain and and she has a history of cholecystectomy in the past. She denies any blood in the vomitus denies blood in the stool.  Allergies:  No Known Allergies    Past Medical History  Diagnosis Date  . Hypertension   . Asthma     No past surgical history on file.  Prior to Admission medications   Medication Sig Start Date End Date Taking? Authorizing Provider  acetaminophen (TYLENOL) 500 MG tablet Take 1,500 mg by mouth every 6 (six) hours as needed. For pain    Yes Historical Provider, MD  albuterol (PROVENTIL HFA;VENTOLIN HFA) 108 (90 BASE) MCG/ACT inhaler Inhale 2 puffs into the lungs every 6 (six) hours as needed. For asthma    Yes Historical Provider, MD  aspirin EC 81 MG tablet Take 81 mg by mouth daily as needed. For pain    Yes Historical Provider, MD  ciprofloxacin (CIPRO) 250 MG tablet Take 250 mg by mouth 2 (two) times daily.     Yes Historical Provider, MD  diphenoxylate-atropine (LOMOTIL) 2.5-0.025 MG per tablet Take 1 tablet by mouth 4 (four) times daily as needed. For diarrhea   Yes Historical Provider, MD  fish oil-omega-3 fatty acids 1000 MG capsule Take 2 g by mouth daily.     Yes Historical Provider, MD  lisinopril-hydrochlorothiazide (PRINZIDE,ZESTORETIC) 20-25 MG per tablet Take 1 tablet by mouth daily.     Yes Historical Provider, MD  POTASSIUM PO Take 1 tablet by mouth every evening.     Yes Historical Provider, MD    Social History: She smokes half pack per day She does not drink  alcohol No h/o illicit drug abuse   Review of Systems:  Constitutional: Denies fever, chills, diaphoresis, appetite change and fatigue.  HEENT: Denies photophobia, eye pain, redness, hearing loss, ear pain, congestion,  Respiratory:as in HPI   Cardiovascular: Denies chest pain, palpitations and leg swelling.  Gastrointestinal: See HPI Genitourinary: Denies dysuria, urgency, frequency,  hematuria, flank pain and difficulty urinating.  Musculoskeletal: Denies myalgias, back pain, joint swelling, arthralgias and gait problem.  Neurological: Denies dizziness, seizures, syncope, weakness, light-headedness, numbness and headaches.     Physical Exam: Blood pressure 102/43, pulse 92, temperature 97.5 F (36.4 C), temperature source Oral, resp. rate 18, SpO2 97.00%. Constitutional: Vital signs reviewed.  Patient is a well-developed and well-nourished in no acute distress and cooperative with exam. Alert and oriented x3.  Head: Normocephalic and atraumatic Mouth: no erythema or exudates, MM dry Eyes: PERRL, EOMI, conjunctivae normal, No scleral icterus.  Neck: Supple, Trachea midline normal ROM Cardiovascular: RRR, S1 normal, S2 normal, no MRG, pulses symmetric and intact bilaterally Pulmonary/Chest: CTAB, no wheezes, rales, or rhonchi Abdominal: Soft, generalized tenderness , no rigidity, no guarding Musculoskeletal: No joint deformities, erythema, or stiffness, ROM full and no nontender  Neurological: A&O x3, Strenght is normal and symmetric bilaterally, cranial nerve II-XII are grossly intact, no focal motor deficit, sensory intact to light touch bilaterally.  Skin: Warm, dry and intact. No rash, cyanosis, or clubbing.  Ext: No  cyanosis, no clubbing, no edema   Labs on Admission:  Results for orders placed during the hospital encounter of 11/27/10 (from the past 48 hour(s))  CBC     Status: Abnormal   Collection Time   11/27/10 11:44 AM      Component Value Range Comment   WBC 20.2  (*) 4.0 - 10.5 (K/uL)    RBC 5.49 (*) 3.87 - 5.11 (MIL/uL)    Hemoglobin 17.1 (*) 12.0 - 15.0 (g/dL)    HCT 40.9 (*) 81.1 - 46.0 (%)    MCV 88.2  78.0 - 100.0 (fL)    MCH 31.1  26.0 - 34.0 (pg)    MCHC 35.3  30.0 - 36.0 (g/dL)    RDW 91.4  78.2 - 95.6 (%)    Platelets 286  150 - 400 (K/uL)   DIFFERENTIAL     Status: Abnormal   Collection Time   11/27/10 11:44 AM      Component Value Range Comment   Neutrophils Relative 80 (*) 43 - 77 (%)    Lymphocytes Relative 9 (*) 12 - 46 (%)    Monocytes Relative 6  3 - 12 (%)    Eosinophils Relative 5  0 - 5 (%)    Basophils Relative 0  0 - 1 (%)    Neutro Abs 16.2 (*) 1.7 - 7.7 (K/uL)    Lymphs Abs 1.8  0.7 - 4.0 (K/uL)    Monocytes Absolute 1.2 (*) 0.1 - 1.0 (K/uL)    Eosinophils Absolute 1.0 (*) 0.0 - 0.7 (K/uL)    Basophils Absolute 0.0  0.0 - 0.1 (K/uL)   COMPREHENSIVE METABOLIC PANEL     Status: Abnormal   Collection Time   11/27/10 11:44 AM      Component Value Range Comment   Sodium 136  135 - 145 (mEq/L)    Potassium 3.6  3.5 - 5.1 (mEq/L)    Chloride 95 (*) 96 - 112 (mEq/L)    CO2 21  19 - 32 (mEq/L)    Glucose, Bld 97  70 - 99 (mg/dL)    BUN 34 (*) 6 - 23 (mg/dL)    Creatinine, Ser 2.13 (*) 0.50 - 1.10 (mg/dL)    Calcium 08.6  8.4 - 10.5 (mg/dL)    Total Protein 8.5 (*) 6.0 - 8.3 (g/dL)    Albumin 3.6  3.5 - 5.2 (g/dL)    AST 23  0 - 37 (U/L)    ALT 27  0 - 35 (U/L)    Alkaline Phosphatase 97  39 - 117 (U/L)    Total Bilirubin 0.4  0.3 - 1.2 (mg/dL)    GFR calc non Af Amer 18 (*) >90 (mL/min)    GFR calc Af Amer 21 (*) >90 (mL/min)   LIPASE, BLOOD     Status: Abnormal   Collection Time   11/27/10 11:44 AM      Component Value Range Comment   Lipase 81 (*) 11 - 59 (U/L)     Radiological Exams on Admission: .    *RADIOLOGY REPORT*  Clinical Data: Abdominal pain with nausea and vomiting.  ACUTE ABDOMEN SERIES (ABDOMEN 2 VIEW & CHEST 1 VIEW)  Comparison: Chest x-ray dated 05/20/2009  Findings: Heart and lungs  are normal. No free air or free fluid in the abdomen. Air scattered throughout nondistended loops of large and small bowel with fluid levels in the large and small bowel suggesting gastroenteritis.  Evidence of previous cholecystectomy. No acute osseous abnormality.  IMPRESSION:  There are fluid levels scattered throughout nondistended large and small bowel suggestive of gastroenteritis.  Original Report Authenticated By: Gwynn Burly, M.D.        Assessment/Plan Active Problems:  Diarrhea Patient has been taking Lomotil for diarrhea which has not helped. The patient's diarrhea started on Wednesday and she has been on Cipro I will hold the Cipro at this time. We'll obtain stool for C. difficile PCR, also will start the patient empirically on Flagyl. Acute Renal failure Patient has been taking hydrochlorothiazide, lisinopril and along with diarrhea which could have contributed to the patient's acute renal failure. Will continue on IV fluids. Will hold the patient's hydrochlorothiazide and lisinopril at this time. Mild Acute pancreatitis: Patient has mild elevation of lipase, will start on clear liquid diet and repeat lipase level in the morning. Leukocytosis Patient is like a white count of 20,000, will obtain urine culture, blood cultures, and start patient empirically on IV Flagyl and we'll wait for the stool studies. We'll also obtain stool culture and stool for C. difficile PCR. ? UTI Patient was told by the PCP that she has UTI and has received antibiotics since Friday for total 4 days. We'll obtain the urine culture and treat if culture grows any bacteria. DVT prophylaxis  Lovenox    Time Spent on Admission: 65 min  Laquanna Veazey S 11/27/2010, 3:39 PM    8:10 PM Called for Patient's BP now in low SBP in 80's. Started IV Fluid Normal saline bolus x1, Alsp paged CCM for transfering the patient to ICU. CCM will come and assess the patient. Patient is alert oriented x 3.  Will continue with fluid bolus.

## 2010-11-28 ENCOUNTER — Inpatient Hospital Stay (HOSPITAL_COMMUNITY): Payer: PRIVATE HEALTH INSURANCE

## 2010-11-28 ENCOUNTER — Encounter (HOSPITAL_COMMUNITY): Payer: Self-pay | Admitting: Physician Assistant

## 2010-11-28 DIAGNOSIS — K625 Hemorrhage of anus and rectum: Secondary | ICD-10-CM

## 2010-11-28 DIAGNOSIS — I959 Hypotension, unspecified: Secondary | ICD-10-CM

## 2010-11-28 DIAGNOSIS — D72829 Elevated white blood cell count, unspecified: Secondary | ICD-10-CM | POA: Diagnosis present

## 2010-11-28 DIAGNOSIS — R197 Diarrhea, unspecified: Secondary | ICD-10-CM

## 2010-11-28 DIAGNOSIS — N179 Acute kidney failure, unspecified: Secondary | ICD-10-CM

## 2010-11-28 DIAGNOSIS — E876 Hypokalemia: Secondary | ICD-10-CM | POA: Diagnosis not present

## 2010-11-28 DIAGNOSIS — R6510 Systemic inflammatory response syndrome (SIRS) of non-infectious origin without acute organ dysfunction: Secondary | ICD-10-CM | POA: Diagnosis present

## 2010-11-28 DIAGNOSIS — A09 Infectious gastroenteritis and colitis, unspecified: Secondary | ICD-10-CM

## 2010-11-28 DIAGNOSIS — E860 Dehydration: Secondary | ICD-10-CM | POA: Diagnosis present

## 2010-11-28 LAB — URINE MICROSCOPIC-ADD ON

## 2010-11-28 LAB — CBC
HCT: 42.4 % (ref 36.0–46.0)
Hemoglobin: 14.6 g/dL (ref 12.0–15.0)
MCH: 30.5 pg (ref 26.0–34.0)
MCHC: 34.4 g/dL (ref 30.0–36.0)
MCV: 88.5 fL (ref 78.0–100.0)
Platelets: 253 10*3/uL (ref 150–400)
RBC: 4.79 MIL/uL (ref 3.87–5.11)
RDW: 13.2 % (ref 11.5–15.5)
WBC: 17 10*3/uL — ABNORMAL HIGH (ref 4.0–10.5)

## 2010-11-28 LAB — URINALYSIS, ROUTINE W REFLEX MICROSCOPIC
Bilirubin Urine: NEGATIVE
Glucose, UA: NEGATIVE mg/dL
Ketones, ur: NEGATIVE mg/dL
Leukocytes, UA: NEGATIVE
Nitrite: NEGATIVE
Protein, ur: 30 mg/dL — AB
Specific Gravity, Urine: 1.021 (ref 1.005–1.030)
Urobilinogen, UA: 0.2 mg/dL (ref 0.0–1.0)
pH: 5.5 (ref 5.0–8.0)

## 2010-11-28 LAB — LIPASE, BLOOD: Lipase: 20 U/L (ref 11–59)

## 2010-11-28 LAB — BASIC METABOLIC PANEL
BUN: 34 mg/dL — ABNORMAL HIGH (ref 6–23)
CO2: 23 mEq/L (ref 19–32)
Calcium: 8.2 mg/dL — ABNORMAL LOW (ref 8.4–10.5)
Chloride: 98 mEq/L (ref 96–112)
Creatinine, Ser: 1.87 mg/dL — ABNORMAL HIGH (ref 0.50–1.10)
GFR calc Af Amer: 36 mL/min — ABNORMAL LOW (ref >90)
GFR calc non Af Amer: 31 mL/min — ABNORMAL LOW (ref >90)
Glucose, Bld: 139 mg/dL — ABNORMAL HIGH (ref 70–99)
Potassium: 3.1 mEq/L — ABNORMAL LOW (ref 3.5–5.1)
Sodium: 133 mEq/L — ABNORMAL LOW (ref 135–145)

## 2010-11-28 LAB — PREGNANCY, URINE: Preg Test, Ur: NEGATIVE

## 2010-11-28 LAB — LACTIC ACID, PLASMA: Lactic Acid, Venous: 1.8 mmol/L (ref 0.5–2.2)

## 2010-11-28 LAB — MRSA PCR SCREENING: MRSA by PCR: NEGATIVE

## 2010-11-28 LAB — PROCALCITONIN: Procalcitonin: 0.4 ng/mL

## 2010-11-28 MED ORDER — SODIUM CHLORIDE 0.9 % IV BOLUS (SEPSIS)
500.0000 mL | Freq: Once | INTRAVENOUS | Status: AC
  Administered 2010-11-28: 09:00:00 via INTRAVENOUS

## 2010-11-28 MED ORDER — LOPERAMIDE HCL 2 MG PO CAPS
2.0000 mg | ORAL_CAPSULE | Freq: Once | ORAL | Status: AC
  Administered 2010-11-28: 2 mg via ORAL
  Filled 2010-11-28 (×2): qty 1

## 2010-11-28 MED ORDER — DEXTROSE 5 % IV SOLN
1.0000 g | INTRAVENOUS | Status: DC
  Filled 2010-11-28: qty 10

## 2010-11-28 MED ORDER — LOPERAMIDE HCL 2 MG PO CAPS
2.0000 mg | ORAL_CAPSULE | Freq: Once | ORAL | Status: AC
  Administered 2010-11-28: 2 mg via ORAL
  Filled 2010-11-28: qty 1

## 2010-11-28 MED ORDER — POTASSIUM CHLORIDE IN NACL 20-0.9 MEQ/L-% IV SOLN
INTRAVENOUS | Status: DC
  Administered 2010-11-28 – 2010-11-29 (×5): via INTRAVENOUS
  Filled 2010-11-28 (×9): qty 1000

## 2010-11-28 MED ORDER — POTASSIUM CHLORIDE 10 MEQ/100ML IV SOLN
10.0000 meq | INTRAVENOUS | Status: AC
  Administered 2010-11-28 (×3): 10 meq via INTRAVENOUS
  Filled 2010-11-28: qty 300

## 2010-11-28 MED ORDER — CIPROFLOXACIN IN D5W 400 MG/200ML IV SOLN
400.0000 mg | INTRAVENOUS | Status: DC
  Administered 2010-11-28 – 2010-11-30 (×3): 400 mg via INTRAVENOUS
  Filled 2010-11-28 (×4): qty 200

## 2010-11-28 MED ORDER — METRONIDAZOLE IN NACL 5-0.79 MG/ML-% IV SOLN
500.0000 mg | Freq: Three times a day (TID) | INTRAVENOUS | Status: DC
  Administered 2010-11-28 – 2010-12-01 (×9): 500 mg via INTRAVENOUS
  Filled 2010-11-28 (×12): qty 100

## 2010-11-28 NOTE — Progress Notes (Signed)
Name: Alexandra Bell  MRN: [id number]  DOB: 1965/02/06  DOS: 11/27/2010  CRITICAL CARE MEDICINE ADMISSION / CONSULTATION NOTE  Referring Physician Lama ([facility])  Reason For Consult Hypotension  Patient Description   45 y/o female presented to the ED with 4-5 days of nearly continuous watery, non bloody diarrhea, subjective fever and leukocytosis. We were consulted for hypotension on the floor.     Location  Start  Stop         Culture  Date  Result   Blood cx x2  11/19     Antibiotic  Indication  Start  Stop   cipro  Infectious diarrhea/uti  11/19     GI Prophylaxis  DVT Prophylaxis   Not indicated  none    Protocols  Consultants       Date  Imaging Studies   11/19  KUB: dilated loops of bowel consistent with gastroenteritis    Date  Events      History Of Present Illness 45 y/o female with hypertension and asthma presented to the [facility] ED on 11/19 with 4-5 days of diarrhea. The diarrhea was watery, non bloody, and nearly continuous for 4-5 days. She noted nausea and vomiting today. She had subjective fevers at home. She saw her pcp on 11/16 and was given Cipro for a possible UTI. She denies urinary complaints now.  In the ED she was found to be hypotensive and in acute renal failure so she was admitted to [facility].   11/20 denies abd pain, watery diarrhea continues, making urine, no dizziness/ Physical Examination  BP 90/43  Pulse 95  Temp(Src) 98.3 F (36.8 C) (Oral)  Resp 18  Ht 5\' 6"  (1.676 m)  Wt 116.8 kg (257 lb 8 oz)  BMI 41.56 kg/m2  SpO2 97%  Gen: well appearing, no acute distress  HEENT: NCAT, PERRL, EOMi, OP clear, neck supple without masses, MM dry  PULM: CTA B  CV: RRR, no mgr, no JVD  AB: BS hyperactive, soft, non- tender , no guarding or rebound, no hsm  Ext: warm, no edema, no clubbing, no cyanosis  Derm: no rash or skin breakdown  Neuro: A&Ox4, CN II-XII intact, strength 5/5 in all 4 extremeties  Labs CBC    Component Value Date/Time   WBC 17.0*  11/28/2010 0900   RBC 4.79 11/28/2010 0900   HGB 14.6 11/28/2010 0900   HCT 42.4 11/28/2010 0900   PLT 253 11/28/2010 0900   MCV 88.5 11/28/2010 0900   MCH 30.5 11/28/2010 0900   MCHC 34.4 11/28/2010 0900   RDW 13.2 11/28/2010 0900   LYMPHSABS 1.8 11/27/2010 1144   MONOABS 1.2* 11/27/2010 1144   EOSABS 1.0* 11/27/2010 1144   BASOSABS 0.0 11/27/2010 1144    BMET    Component Value Date/Time   NA 133* 11/28/2010 0900   K 3.1* 11/28/2010 0900   CL 98 11/28/2010 0900   CO2 23 11/28/2010 0900   GLUCOSE 139* 11/28/2010 0900   BUN 34* 11/28/2010 0900   CREATININE 1.87* 11/28/2010 0900   CALCIUM 8.2* 11/28/2010 0900   GFRNONAA 31* 11/28/2010 0900   GFRAA 36* 11/28/2010 0900      Assessment  Patient Active Hospital Problem List: Hypotension (11/27/2010) Assessment: Due to hypovolemia from days of diarrhea, nausea, vomiting, and poor po intake. Doubt septis as she is not febrile, and not tachycardic or tachypnic. Her hypotension and elevated WBC are consistent either with viral gastroenteritis or infectious diarrhea.  Plan:  -continue aggressive fluid resuscitation PO &  IV -Does not need pressors considering good mentation, starting to make urine    Acute renal failure (11/27/2010) Assessment: due to hypotension/volume depletion , improving  Plan:  -follow uop  -IVF resuscitation  -replete K  Diarrhea (11/27/2010) Assessment: likely viral in origin, but considering duration, severity would treat for infectious diarrhea with cipro Plan:  -stop flagyl (c.diff negative -On cipro 500 mg po bid  -CT abdomen   UTI (lower urinary tract infection) (11/27/2010) Assessment: questionable, currently asymptomatic Plan:  -monitor for symptoms of UTI   Best Practice   Thromboprophylaxis: lovenox   Ulcer prophylaxis: n/a   PCCM to sign off, pl call as needed  Melina Mosteller V.

## 2010-11-28 NOTE — Progress Notes (Signed)
UTILIZATION REVIEW COMPLETE Jerelyn Trimarco Jones 11/28/2010 [phone] OR [phone]  

## 2010-11-28 NOTE — Progress Notes (Signed)
Subjective: Endorses continued painless diarrhea onset last Wednesday. Saw MD Friday and was started on Cipro for ? Of UTI. Today states having large volume of bilious thin diarrhea, no blood. Voids are mixed in with the stools so i/o inaccurate.  Objective: Vital signs in last 24 hours: Temp:  [97.5 F (36.4 C)-98.5 F (36.9 C)] 97.6 F (36.4 C) 12/18/2022 0811) Pulse Rate:  [78-99] 99  12/18/22 0725) Resp:  [16-24] 19  2022-12-18 0725) BP: (62-116)/(29-61) 95/52 mmHg 2022/12/18 0725) SpO2:  [94 %-100 %] 96 % 18-Dec-2022 0725) Weight:  [116.8 kg (257 lb 8 oz)-119.9 kg (264 lb 5.3 oz)] 264 lb 5.3 oz (119.9 kg) 2022/12/18 0500) Weight change:  Last BM Date: 18-Dec-2010  Intake/Output from previous day: 11/19 0701 - 12-18-2022 0700 In: 1200 [I.V.:1200] Out: 510 [Urine:500; Stool:10] Intake/Output this shift: Total I/O In: 240 [P.O.:240] Out: 302 [Urine:300; Stool:2]  General appearance: alert, cooperative, appears stated age and no distress Resp: clear to auscultation bilaterally, room air, no tachypnea Cardio: regular rate and rhythm, S1, S2 normal, no murmur, click, rub or gallop, IVF at 150/hr. NSR GI: soft, non-tender; bowel sounds normal; no masses,  no organomegaly, large volume bilious diarrhea persists Extremities: extremities normal, atraumatic, no cyanosis or edema but hands and feet are cool to the touch. Neurologic: Alert and oriented X 3, normal strength and tone. Normal symmetric reflexes. Normal coordination and gait  Lab Results:  Basename 11/27/10 1654 11/27/10 1144  WBC 21.4* 20.2*  HGB 16.7* 17.1*  HCT 46.3* 48.4*  PLT 325 286   BMET  Basename 11/27/10 1654 11/27/10 1144  NA -- 136  K -- 3.6  CL -- 95*  CO2 -- 21  GLUCOSE -- 97  BUN -- 34*  CREATININE 3.20* 2.92*  CALCIUM -- 10.1    Studies/Results: Dg Abd 2 Views  Dec 18, 2010  *RADIOLOGY REPORT*  Clinical Data: Diarrhea, abdominal pain.  ABDOMEN - 2 VIEW  Comparison: 11/27/2010  Findings: Clear lung bases.  Nonobstructive  bowel gas pattern.  No free intraperitoneal air identified.  Surgical clips right upper quadrant.  Organ outlines normal where seen.  No acute osseous abnormality.  IMPRESSION: Nonobstructive bowel gas pattern.  Original Report Authenticated By: Waneta Martins, M.D.   Dg Abd Acute W/chest  11/27/2010  *RADIOLOGY REPORT*  Clinical Data: Abdominal pain with nausea and vomiting.  ACUTE ABDOMEN SERIES (ABDOMEN 2 VIEW & CHEST 1 VIEW)  Comparison: Chest x-ray dated 05/20/2009  Findings: Heart and lungs are normal.  No free air or free fluid in the abdomen.  Air scattered throughout nondistended loops of large and small bowel with fluid levels in the large and small bowel suggesting gastroenteritis.  Evidence of previous cholecystectomy.  No acute osseous abnormality.  IMPRESSION: There are fluid levels scattered throughout nondistended large and small bowel suggestive of gastroenteritis.  Original Report Authenticated By: Gwynn Burly, M.D.    Medications: I have reviewed the patient's current medications.  Assessment/Plan:  Active Problems:  Diarrhea Remains persistent. C. Difficile PCR negative so will give 2 doses of imodium to try to slow diarrhea volume. Possible having AGE made worse by PO Cipro. In the event she has non-specific colitis will change Cipro to IV route and add Flagyl. May need GI consult if not improved in the next 24 hours. No blood so not c/w IBD.  1155am- received call from RN- patient now experiencing bloody diarrhea. Will ask GI to see in consultation. Also note Flagyl ordered earlier has been dc'd by PCCM since  c.diff is negative. Will d/w GI and Dr. Isidoro Donning to see if other indicators are present to warrant resumption of this medicine especially in the setting of bloody diarrhea.   Hypotension/SIRS BP still soft but >90 systolic. Likely due to dehydration. Will continue to treat underlying causes and provide supportive care. Appreciate PCCM assistance. Will check PCT and  lactic acid as a precaution. Blood cultures have been ordered but do not look like they have been obtained. Will give 500cc NS bolus now.   Acute renal failure Multi-factorial due to use of ACE I with HCTZ pre admit, dehydration and associated hypotension. Unable to obtain labs so far this am due to patient still peripherally vasoconstricted. Creatinine had actually risen several hours after admit. Hopefully trending downward as hydration improves.  1230 PM: Creatinine now down to 1.87  Hypokalemia 1230 pm-New problem- will add KCL to maintenance IVF  Leukocytosis Did trend upward after admit. Likely due to dehydration and low perfusion. Labs pending this am. As noted will change anti-microbial coverage and follow.  1230 pm- WBC has trended down to 17,000. UA negative but this obtained on antibiotics so will continue antibiotics until cultures are back.   Dehydration, severe Still peripherally vasoconstricted so will increase IVF from 150 to 200/hr and follow response. Will place foley to follow accurate UOP.  1230 pm- less hemo concentrated. Hgb down from 17.1 to 14.6.  Hypertension Now with relative hypotension   UTI (lower urinary tract infection) Reportedly treated as this pre admit. Unable to locate UA this admit despite order so will make sure has been obtained-can certainly obtain after foley placed   ASTHMA Currently stable without wheezing or SOB.  Disposition Remain in SDU.   LOS: 1 day   ELLIS,ALLISON L. 11/28/2010, 8:39 AM  I have seen and examined the patient, database reviewed. Agree with the note and plan as outlined by Ms. Rennis Harding, NP. Ordered CT abdomen and pelvis to rule out any colitis. Patient will likely need a GI consultation for possible flexible sigmoidoscopy or colonoscopy. Continue ciprofloxacin for now C. difficile negative. Continue to hydrate, appreciate critical care recommendations.   Jeremie Giangrande 11/28/2010 12:39 PM

## 2010-11-28 NOTE — Consult Note (Signed)
I have reviewed the above note, examined the patient and agree with plan of treatment Acute gastroenteritis, with high volume diarrhea and hematochezia, r/o bacterial etiology. CT scan is essentially normal , KUB- non obstructive pattern. Will await stool studies and continue antibiotic coverage. Consider flex sigmoidoscopy for definite diagnosis if no improvement in next 24-48 hrs.

## 2010-11-28 NOTE — Consult Note (Signed)
[facility] Consult: 1:56 PM 11/28/2010   Referring Provider: Isidoro Donning  Primary Care Physician:  Dorrene German, MD Primary Gastroenterologist: None,    Reason for Consultation:  Diarrhea, now bloody  HPI: Alexandra Bell is a 45 y.o. female.  Last Wed, 11/14, developed diarrhea and crampy mid abdominal pain.  Claims stools "every 5 minutes", worse after POs.  Nausea at first which subsided and recurred with vomitting yesterday. Some cold sweats.  Primary MD treated her with Cipro beginning the 16th apparently for a UTI.  Treated with Lomotil,  GI sxs did not improve.  Admitted with dehdration,  Hypotension, ARF,  WBCs of 20K, left shift.  Lipase incidentally 81.  Empiric treatment with Flagyl PO added, but now dcd as CDiff toxin is negative.  She continue to have multiple small stools "330" today.  Stools turned bloody mid AM today, returned to nonbloody in past 5 minutes.     Past Medical History  Diagnosis Date  . Hypertension   . Asthma   . Obesity, Class III, BMI 40-49.9 (morbid obesity)     Past Surgical History  Procedure Date  . Abdominal hysterectomy   . Cholecystectomy 2001  . Ercp/sphincterotomy/stone extraction 2001  . Bronchoscopy, lymph node bx MAY 2011    Prior to Admission medications   Medication Sig Start Date End Date Taking? Authorizing Provider  acetaminophen (TYLENOL) 500 MG tablet Take 1,500 mg by mouth every 6 (six) hours as needed. For pain   Yes Historical Provider, MD  albuterol (PROVENTIL HFA;VENTOLIN HFA) 108 (90 BASE) MCG/ACT inhaler Inhale 2 puffs into the lungs every 6 (six) hours as needed. For asthma    Yes Historical Provider, MD  aspirin EC 81 MG tablet Take 81 mg by mouth daily as needed. For pain    Yes Historical Provider, MD  ciprofloxacin (CIPRO) 250 MG tablet Take 250 mg by mouth 2 (two) times daily.     Yes Historical Provider, MD  diphenoxylate-atropine (LOMOTIL) 2.5-0.025 MG per tablet Take 1 tablet  by mouth 4 (four) times daily as needed. For diarrhea   Yes Historical Provider, MD  fish oil-omega-3 fatty acids 1000 MG capsule Take 2 g by mouth daily.     Yes Historical Provider, MD  lisinopril-hydrochlorothiazide (PRINZIDE,ZESTORETIC) 20-25 MG per tablet Take 1 tablet by mouth daily.     Yes Historical Provider, MD  POTASSIUM PO Take 1 tablet by mouth every evening.     Yes Historical Provider, MD    Scheduled Meds:    . ciprofloxacin  400 mg Intravenous Q24H  . enoxaparin  40 mg Subcutaneous Q24H  . loperamide  2 mg Oral Once  . loperamide  2 mg Oral Once  . metronidazole  500 mg Intravenous Q8H  . potassium chloride  10 mEq Intravenous Q1 Hr x 3  . sodium chloride  1,000 mL Intravenous Once  . sodium chloride  500 mL Intravenous Once  . DISCONTD: cefTRIAXone (ROCEPHIN) IV  1 g Intravenous Q24H  . DISCONTD: ciprofloxacin  500 mg Oral BID  . DISCONTD: metronidazole  500 mg Intravenous Q8H   Infusions:    . 0.9 % NaCl with KCl 20 mEq / L    . DISCONTD: sodium chloride 100 mL/hr at 11/27/10 1659  . DISCONTD: sodium chloride 200 mL/hr at 11/28/10 1222   PRN Meds: acetaminophen, albuterol, ondansetron (ZOFRAN) IV, ondansetron   Allergies as of 11/27/2010  . (No Known Allergies)    No family history on file.  History  Social History  . Marital Status: Married    Spouse Name: N/A    Number of Children: N/A  . Years of Education: N/A   Occupational History  . Food prep, server [employer]   Social History Main Topics  . Smoking status: Current Everyday Smoker -- 1.5 packs/day for 30 years    Types: Cigarettes  . Smokeless tobacco: Not on file  . Alcohol Use: No  . Drug Use: No  . Sexually Active:    Other Topics Concern  . Not on file   Social History Narrative  . No narrative on file    REVIEW OF SYSTEMS: Constitutional:  No weight loss, feels weak.  City water supply.  No sick contacts. ENT:  No oral ulcers, no nose bleeds Pulm:  Chronic asthma CV:   No CP, no palpitations, LE edema when she stands for a long time GU:  No dysuria, no hematuria GI:  Above.  No heart burn, no dysphagia Heme:  No unusual bleeding except .    Transfusions:  none Neuro:  No HA, no seizures Derm:  Itching on palms but no rash Endocrine:  No increased thirst or urination Immunization:  Flu shot in October 2012 Travel:  No non domestic travel   PHYSICAL EXAM: Vital signs in last 24 hours: Temp:  [97.5 F (36.4 C)-98.5 F (36.9 C)] 98.5 F (36.9 C) (11/20 1221) Pulse Rate:  [84-99] 99  (11/20 0725) Resp:  [16-24] 19  (11/20 0725) BP: (62-106)/(29-61) 95/52 mmHg (11/20 0725) SpO2:  [94 %-100 %] 96 % (11/20 0725) Weight:  [116.8 kg (257 lb 8 oz)-119.9 kg (264 lb 5.3 oz)] 264 lb 5.3 oz (119.9 kg) (11/20 0500)  Last BM Date: 11/28/10  General: Morbidly obese, seems frustrated with interview questions.  Does not look ikll  Head:  No trauma  Eyes:  No pallor Ears:  No hearing deficits  Nose:  No diascharge Mouth:  Clear, moist Neck:  Obese, no JVD, no mass Lungs:  Clear B but BS reduced Heart: RRR, 1/6 SEM Abdomen:  Soft, obese, active BS, NT, no mass.   Rectal: Deferred   Musc/Skeltl: no joint deformity Extremities:  No edema  Neurologic:  A & O x three.  No tremor.  Moves all 4s Skin:  No visible rash. Tattoos:  None seen Nodes:  No cervical adenopathy   Psych:  Not depressed.  Intake/Output from previous day: 11/19 0701 - 11/20 0700 In: 1200 [I.V.:1200] Out: 510 [Urine:500; Stool:10] Intake/Output this shift: Total I/O In: 1645 [P.O.:1645] Out: 407 [Urine:400; Stool:7]  LAB RESULTS:  Basename 11/28/10 0900 11/27/10 1654 11/27/10 1144  WBC 17.0* 21.4* 20.2*  HGB 14.6 16.7* 17.1*  HCT 42.4 46.3* 48.4*  PLT 253 325 286   BMET Lab Results  Component Value Date   NA 133* 11/28/2010   NA 136 11/27/2010   K 3.1* 11/28/2010   K 3.6 11/27/2010   CL 98 11/28/2010   CL 95* 11/27/2010   CO2 23 11/28/2010   CO2 21 11/27/2010    GLUCOSE 139* 11/28/2010   GLUCOSE 97 11/27/2010   BUN 34* 11/28/2010   BUN 34* 11/27/2010   CREATININE 1.87* 11/28/2010   CREATININE 3.20* 11/27/2010   CREATININE 2.92* 11/27/2010   CALCIUM 8.2* 11/28/2010   CALCIUM 10.1 11/27/2010   LFT Lab Results  Component Value Date   PROT 8.5* 11/27/2010   ALBUMIN 3.6 11/27/2010   AST 23 11/27/2010   ALT 27 11/27/2010   ALKPHOS 97 11/27/2010  BILITOT 0.4 11/27/2010      Ref. Range 11/28/2010 10:33  Color, Urine Latest Range: YELLOW  YELLOW  Appearance Latest Range: CLEAR  HAZY (A)  Specific Gravity, Urine Latest Range: 1.005-1.030  1.021  pH Latest Range: 5.0-8.0  5.5  Glucose, UA Latest Range: NEGATIVE mg/dL NEGATIVE  Bilirubin Urine Latest Range: NEGATIVE  NEGATIVE  Ketones, ur Latest Range: NEGATIVE mg/dL NEGATIVE  Protein Latest Range: NEGATIVE mg/dL 30 (A)  Urobilinogen, UA Latest Range: 0.0-1.0 mg/dL 0.2  Nitrite Latest Range: NEGATIVE  NEGATIVE  Leukocytes, UA Latest Range: NEGATIVE  NEGATIVE  WBC, UA Latest Range: <3 WBC/hpf 0-2  RBC / HPF Latest Range: <3 RBC/hpf 7-10  Squamous Epithelial / LPF Latest Range: RARE  FEW (A)  Bacteria, UA Latest Range: RARE  FEW (A)   C-Diff Negative on 11/27/2010  Drugs of Abuse  No results found for this basename: labopia,  cocainscrnur,  labbenz,  amphetmu,  thcu,  labbarb    RADIOLOGY STUDIES: Dg Abd 2 Views  11/28/2010  *RADIOLOGY REPORT*  Clinical Data: Diarrhea, abdominal pain.  ABDOMEN - 2 VIEW  Comparison: 11/27/2010  Findings: Clear lung bases.  Nonobstructive bowel gas pattern.  No free intraperitoneal air identified.  Surgical clips right upper quadrant.  Organ outlines normal where seen.  No acute osseous abnormality.  IMPRESSION: Nonobstructive bowel gas pattern.  Original Report Authenticated By: Waneta Martins, M.D.   Dg Abd Acute W/chest  11/27/2010  *RADIOLOGY REPORT*  Clinical Data: Abdominal pain with nausea and vomiting.  ACUTE ABDOMEN SERIES (ABDOMEN 2 VIEW &  CHEST 1 VIEW)  Comparison: Chest x-ray dated 05/20/2009  Findings: Heart and lungs are normal.  No free air or free fluid in the abdomen.  Air scattered throughout nondistended loops of large and small bowel with fluid levels in the large and small bowel suggesting gastroenteritis.  Evidence of previous cholecystectomy.  No acute osseous abnormality.  IMPRESSION: There are fluid levels scattered throughout nondistended large and small bowel suggestive of gastroenteritis.  Original Report Authenticated By: Gwynn Burly, M.D.   CT Scan ABD/Pelvis ordered:  Not yet completed.  ENDOSCOPIC STUDIES:  September 06, 1999 PROCEDURE:  Endoscopic retrograde cholangiopancreatography with endoscopic sphincterotomy and common bile duct stone removal. By: Charolett Bumpers III, M.D.  EGD more than 20 years ago in [place].  IMPRESSION: 1.  Acute diarrheal/GI illness, now with bloody stools. sxs most c/w gastroenteritis. C Diff negative, r/o other infectious cause, r/o ischemic colitis.  Given acute onset of sxs, doubt IBD. 2. Dx of UTI treated with Cipro last week and continued on this through inpt stay. UA of today does not confirm UTI. 3.  Asthma 4. Morbid Obesity. 5. ARF, creatinine improving.  PLAN: 1.  Await CT scan abdomen.  2.  May need flex sig.    LOS: 1 day   Jennye Moccasin  11/28/2010, 1:56 PM

## 2010-11-29 ENCOUNTER — Other Ambulatory Visit: Payer: Self-pay | Admitting: Internal Medicine

## 2010-11-29 ENCOUNTER — Encounter (HOSPITAL_COMMUNITY)
Admission: EM | Disposition: A | Discharge status: Home / Self Care | Payer: Self-pay | Source: Home / Self Care | Attending: Internal Medicine

## 2010-11-29 ENCOUNTER — Encounter (HOSPITAL_COMMUNITY): Payer: Self-pay | Admitting: *Deleted

## 2010-11-29 DIAGNOSIS — K5289 Other specified noninfective gastroenteritis and colitis: Secondary | ICD-10-CM

## 2010-11-29 HISTORY — PX: FLEXIBLE SIGMOIDOSCOPY: SHX5431

## 2010-11-29 LAB — BASIC METABOLIC PANEL
BUN: 17 mg/dL (ref 6–23)
CO2: 21 mEq/L (ref 19–32)
Calcium: 8.3 mg/dL — ABNORMAL LOW (ref 8.4–10.5)
Chloride: 98 mEq/L (ref 96–112)
Creatinine, Ser: 0.82 mg/dL (ref 0.50–1.10)
GFR calc Af Amer: >90 mL/min (ref >90)
GFR calc non Af Amer: 85 mL/min — ABNORMAL LOW (ref >90)
Glucose, Bld: 108 mg/dL — ABNORMAL HIGH (ref 70–99)
Potassium: 4.1 mEq/L (ref 3.5–5.1)
Sodium: 131 mEq/L — ABNORMAL LOW (ref 135–145)

## 2010-11-29 LAB — FECAL LACTOFERRIN, QUANT: Fecal Lactoferrin: POSITIVE

## 2010-11-29 LAB — URINE CULTURE
Colony Count: NO GROWTH
Culture  Setup Time: 201211201120
Culture: NO GROWTH

## 2010-11-29 LAB — CBC
HCT: 42.1 % (ref 36.0–46.0)
Hemoglobin: 14.8 g/dL (ref 12.0–15.0)
MCH: 31 pg (ref 26.0–34.0)
MCHC: 35.2 g/dL (ref 30.0–36.0)
MCV: 88.1 fL (ref 78.0–100.0)
Platelets: 207 10*3/uL (ref 150–400)
RBC: 4.78 MIL/uL (ref 3.87–5.11)
RDW: 13 % (ref 11.5–15.5)
WBC: 20.6 10*3/uL — ABNORMAL HIGH (ref 4.0–10.5)

## 2010-11-29 LAB — DIFFERENTIAL
Basophils Absolute: 0 10*3/uL (ref 0.0–0.1)
Basophils Relative: 0 % (ref 0–1)
Eosinophils Absolute: 1.2 10*3/uL — ABNORMAL HIGH (ref 0.0–0.7)
Eosinophils Relative: 6 % — ABNORMAL HIGH (ref 0–5)
Lymphocytes Relative: 7 % — ABNORMAL LOW (ref 12–46)
Lymphs Abs: 1.4 10*3/uL (ref 0.7–4.0)
Monocytes Absolute: 0.8 10*3/uL (ref 0.1–1.0)
Monocytes Relative: 4 % (ref 3–12)
Neutro Abs: 17.2 10*3/uL — ABNORMAL HIGH (ref 1.7–7.7)
Neutrophils Relative %: 83 % — ABNORMAL HIGH (ref 43–77)
WBC Morphology: INCREASED

## 2010-11-29 SURGERY — SIGMOIDOSCOPY, FLEXIBLE
Anesthesia: Moderate Sedation

## 2010-11-29 MED ORDER — LOPERAMIDE HCL 2 MG PO CAPS
4.0000 mg | ORAL_CAPSULE | Freq: Three times a day (TID) | ORAL | Status: AC
  Administered 2010-11-29 (×3): 4 mg via ORAL
  Filled 2010-11-29 (×4): qty 2

## 2010-11-29 MED ORDER — MORPHINE SULFATE 2 MG/ML IJ SOLN
1.0000 mg | INTRAMUSCULAR | Status: DC | PRN
  Administered 2010-11-29 – 2010-11-30 (×4): 2 mg via INTRAVENOUS
  Filled 2010-11-29 (×4): qty 1

## 2010-11-29 MED ORDER — FENTANYL CITRATE 0.05 MG/ML IJ SOLN
INTRAMUSCULAR | Status: AC
  Filled 2010-11-29: qty 4

## 2010-11-29 MED ORDER — ALBUTEROL SULFATE HFA 108 (90 BASE) MCG/ACT IN AERS
2.0000 | INHALATION_SPRAY | RESPIRATORY_TRACT | Status: DC | PRN
  Administered 2010-11-30 (×2): 2 via RESPIRATORY_TRACT
  Filled 2010-11-29: qty 6.7

## 2010-11-29 MED ORDER — OCTREOTIDE ACETATE 100 MCG/ML IJ SOLN
100.0000 ug | Freq: Three times a day (TID) | INTRAMUSCULAR | Status: DC
  Administered 2010-11-29 – 2010-11-30 (×5): 100 ug via SUBCUTANEOUS
  Filled 2010-11-29 (×11): qty 1

## 2010-11-29 MED ORDER — MIDAZOLAM HCL 10 MG/2ML IJ SOLN
INTRAMUSCULAR | Status: AC
  Filled 2010-11-29: qty 4

## 2010-11-29 MED ORDER — MIDAZOLAM HCL 10 MG/2ML IJ SOLN
INTRAMUSCULAR | Status: DC | PRN
  Administered 2010-11-29 (×2): 2 mg via INTRAVENOUS

## 2010-11-29 MED ORDER — FENTANYL NICU IV SYRINGE 50 MCG/ML
INJECTION | INTRAMUSCULAR | Status: DC | PRN
  Administered 2010-11-29 (×2): 25 ug via INTRAVENOUS

## 2010-11-29 MED ORDER — LOPERAMIDE HCL 2 MG PO CAPS
2.0000 mg | ORAL_CAPSULE | Freq: Three times a day (TID) | ORAL | Status: DC | PRN
  Administered 2010-11-30 – 2010-12-01 (×2): 2 mg via ORAL
  Filled 2010-11-29 (×3): qty 1

## 2010-11-29 NOTE — Progress Notes (Signed)
[facility] Daily Rounding Note 11/29/2010, 8:35 AM  SUBJECTIVE: Diarrhea unchecked.  Stil small frequent stools.  No gross blood today.  Abd cramps continue.  No n/v.  On clears. OBJECTIVE: Looks tired, not toxic or acutely ill.  Vital signs in last 24 hours: Temp:  [97.4 F (36.3 C)-98.5 F (36.9 C)] 98.1 F (36.7 C) (11/21 0817) Pulse Rate:  [89-109] 109  (11/21 0500) Resp:  [15-28] 15  (11/21 0500) BP: (111-129)/(52-93) 123/63 mmHg (11/21 0500) SpO2:  [96 %-100 %] 99 % (11/21 0500) Weight:  [120.1 kg (264 lb 12.4 oz)] 264 lb 12.4 oz (120.1 kg) (11/21 0033) Last BM Date: 11/28/10  Heart: Slightly tachy, regular. Chest: Clear Abdomen: Active BS, minor tenderness, ND  Extremities: No edema Neuro/Psych:  Cooperative, alert.  Intake/Output from previous day: 11/20 0701 - 11/21 0700 In: 7465 [P.O.:2485; I.V.:4280; IV Piggyback:700] Out: 9000 [Urine:1300; Stool:7700]  Intake/Output this shift: Total I/O In: -  Out: 650 [Urine:200; Stool:450]  Lab Results:  [facility] 11/29/10 0520 11/28/10 0900 11/27/10 1654  WBC 20.6* 17.0* 21.4*  HGB 14.8 14.6 16.7*  HCT 42.1 42.4 46.3*  PLT 207 253 325   BMET  Basename 11/29/10 0520 11/28/10 0900 11/27/10 1654 11/27/10 1144  NA 131* 133* -- 136  K 4.1 3.1* -- 3.6  CL 98 98 -- 95*  CO2 21 23 -- 21  GLUCOSE 108* 139* -- 97  BUN 17 34* -- 34*  CREATININE 0.82 1.87* 3.20* --  CALCIUM 8.3* 8.2* -- 10.1   LFT  Basename 11/27/10 1144  PROT 8.5*  ALBUMIN 3.6  AST 23  ALT 27  ALKPHOS 97  BILITOT 0.4  BILIDIR --  IBILI --    Studies/Results: Ct Abdomen Pelvis Wo Contrast  11/28/2010  *RADIOLOGY REPORT*  Clinical Data: Abdominal pain.  Diarrhea and distension.  CT ABDOMEN AND PELVIS WITHOUT CONTRAST  Technique:  Multidetector CT imaging of the abdomen and pelvis was performed following the standard protocol without intravenous contrast.  Comparison: No comparison exams available for direct review. Report from 09/06/1999  exam is available.  Findings: Within the limitation of unenhanced imaging, no focal hepatic, splenic, pancreatic, renal or right adrenal lesion is noted.  Hyperplasia of the left adrenal gland may be related to adenomatous changes.  The mild lobulated contour of the anterior aspect of the liver is of questionable significance as there are no secondary findings to otherwise suggest cirrhosis.  Clinical and laboratory correlation recommended.  Mild fatty infiltration liver. Post cholecystectomy.  No extraluminal bowel inflammatory process or free air.  Post hysterectomy.  Decompressed urinary bladder with Foley catheter in place.  Fatty containing right inguinal and periumbilical hernia.  Advanced atherosclerotic type changes for the patient's age with calcification of the aorta and branch vessels.  No abdominal aortic aneurysm.  Increased number of normal size to slightly enlarged retroperitoneal lymph nodes.  Slight haziness lung bases may represent chronic changes versus mild pneumonitis type changes without focal nodularity.  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 Degenerative change L4-5 facet joint.  IMPRESSION: No extraluminal bowel inflammatory process noted.  Enlarged adrenal gland suggestive of hyperplasia.  Mild fatty infiltration of the liver with slightly lobulated anterior contour without other findings of cirrhosis.  Clinical and laboratory correlation recommended.  Increased number of normal size to slightly enlarged retroperitoneal lymph nodes.  Etiology indeterminate.  Degenerative changes L4-5 and L5-S1 as detailed above.  Advanced atherosclerotic type changes aorta and iliac arteries.  Original Report  Authenticated By: Fuller Canada, M.D.     ASSESMENT: 1.  Prolonged acute diarrheal illness.  CT and plain xrays non diagnostic.  Stool studies unrevealing.  Now back on Flagyl IV along with ongoing IV Cipro.  WBCs rising 2.  Tachycardia and rising BUN c/w  dehydration 3. Hyponatremia   PLAN: 1. Will give Immodium, see if it helps and she is asking for it. 2.  Flex Sig?  Will d/w Dr. Juanda Chance.   LOS: 2 days   Alexandra Bell  11/29/2010, 8:35 AM

## 2010-11-29 NOTE — Progress Notes (Signed)
11:42 PM [facility] "X" Cover:-  Reviewed pt's note/h/o and POC  Called by RN re: patient who has been experincing some SOB-states that she has had high-output diarrhoea since Monday.  Has a h/o asthma, adult onset, last time she has had SOB was a"while" ago.  No cp/sob/n/v  States she juts "cannot breathe"  NOted pt on !VF 200 cc/hr for copious diarrhoea  O/E Alert, looks SOb, RR about 20 x per min Wheeze bilat, R>>L no TVR,TVF, Egophony  Percussion note sonrous   DDx-Fluid overload vs Ashtma exacerb--favour Asthma exacerb as she felt better earlier today with Neb  P-Get stat CXR, Neb, and peak flow  If any worse, would consider ABG and reassesment     [name], MD [facility] [phone]

## 2010-11-29 NOTE — Progress Notes (Signed)
Patient returned from endo, resting in bed O2 sat reading  50%, patient is alert and oriented no s/o respiratory distress, O2 applied at 2L patient repositioned to high fowlers and instructed to take deep  Breaths. O2 sat back up to 96%. Patient is resting with no s/o distress. Will continue to monitor patient.

## 2010-11-29 NOTE — Op Note (Signed)
Flexible sigmoidoscopy without prep shows high volume watery diarrhea, diffuse mild inflammation of the colon mucosa with scattered superficial erosions which could be secondary to diarrhea. Bx's taken. Suspect small bowl secretory diarrhea. Consider Octreotide 100 mg sq q 8 hrs, await stool studies, may need a small bowl biopsy if no improvement, obtain Celiac profile.

## 2010-11-29 NOTE — Progress Notes (Addendum)
Subjective: Continues with large volume diarrheal stools and now endorses midline crampy abdominal pain. Still having streaks of blood with stools and perirectal area quite raw.  Objective: Vital signs in last 24 hours: Temp:  [97.4 F (36.3 C)-98.5 F (36.9 C)] 98.1 F (36.7 C) (11/21 0817) Pulse Rate:  [89-109] 109  (11/21 0500) Resp:  [15-28] 15  (11/21 0500) BP: (111-129)/(52-93) 123/63 mmHg (11/21 0500) SpO2:  [96 %-100 %] 99 % (11/21 0500) Weight:  [120.1 kg (264 lb 12.4 oz)] 264 lb 12.4 oz (120.1 kg) (11/21 0033) Weight change: 3.3 kg (7 lb 4.4 oz) Last BM Date: 11/28/10  Intake/Output from previous day: 11/20 0701 - 11/21 0700 In: 7465 [P.O.:2485; I.V.:4280; IV Piggyback:700] Out: 9000 [Urine:1300; Stool:7700] Intake/Output this shift: Total I/O In: -  Out: 650 [Urine:200; Stool:450]  General appearance: alert, cooperative, appears stated age and no distress Resp: clear to auscultation bilaterally, room air, no tachypnea Cardio: regular rate and rhythm, S1, S2 normal, no murmur, click, rub or gallop, IVF at 150/hr. NSR GI: soft, non-tender; bowel sounds normal; no masses,  no organomegaly, large volume bilious diarrhea persists Extremities: extremities normal, atraumatic, no cyanosis or edema but hands and feet are cool to the touch. Neurologic: Alert and oriented X 3, normal strength and tone. Normal symmetric reflexes. Normal coordination and gait  Lab Results:  Basename 11/29/10 0520 11/28/10 0900  WBC 20.6* 17.0*  HGB 14.8 14.6  HCT 42.1 42.4  PLT 207 253   BMET  Basename 11/29/10 0520 11/28/10 0900  NA 131* 133*  K 4.1 3.1*  CL 98 98  CO2 21 23  GLUCOSE 108* 139*  BUN 17 34*  CREATININE 0.82 1.87*  CALCIUM 8.3* 8.2*    Studies/Results: Ct Abdomen Pelvis Wo Contrast  11/28/2010  *RADIOLOGY REPORT*  Clinical Data: Abdominal pain.  Diarrhea and distension.  CT ABDOMEN AND PELVIS WITHOUT CONTRAST  Technique:  Multidetector CT imaging of the abdomen  and pelvis was performed following the standard protocol without intravenous contrast.  Comparison: No comparison exams available for direct review. Report from 09/06/1999 exam is available.  Findings: Within the limitation of unenhanced imaging, no focal hepatic, splenic, pancreatic, renal or right adrenal lesion is noted.  Hyperplasia of the left adrenal gland may be related to adenomatous changes.  The mild lobulated contour of the anterior aspect of the liver is of questionable significance as there are no secondary findings to otherwise suggest cirrhosis.  Clinical and laboratory correlation recommended.  Mild fatty infiltration liver. Post cholecystectomy.  No extraluminal bowel inflammatory process or free air.  Post hysterectomy.  Decompressed urinary bladder with Foley catheter in place.  Fatty containing right inguinal and periumbilical hernia.  Advanced atherosclerotic type changes for the patient's age with calcification of the aorta and branch vessels.  No abdominal aortic aneurysm.  Increased number of normal size to slightly enlarged retroperitoneal lymph nodes.  Slight haziness lung bases may represent chronic changes versus mild pneumonitis type changes without focal nodularity.  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 Degenerative change L4-5 facet joint.  IMPRESSION: No extraluminal bowel inflammatory process noted.  Enlarged adrenal gland suggestive of hyperplasia.  Mild fatty infiltration of the liver with slightly lobulated anterior contour without other findings of cirrhosis.  Clinical and laboratory correlation recommended.  Increased number of normal size to slightly enlarged retroperitoneal lymph nodes.  Etiology indeterminate.  Degenerative changes L4-5 and L5-S1 as detailed above.  Advanced atherosclerotic type changes aorta and iliac arteries.  Original  Report Authenticated By: Fuller Canada, M.D.   Dg Abd 2 Views  11/28/2010  *RADIOLOGY REPORT*   Clinical Data: Diarrhea, abdominal pain.  ABDOMEN - 2 VIEW  Comparison: 11/27/2010  Findings: Clear lung bases.  Nonobstructive bowel gas pattern.  No free intraperitoneal air identified.  Surgical clips right upper quadrant.  Organ outlines normal where seen.  No acute osseous abnormality.  IMPRESSION: Nonobstructive bowel gas pattern.  Original Report Authenticated By: Waneta Martins, M.D.   Dg Abd Acute W/chest  11/27/2010  *RADIOLOGY REPORT*  Clinical Data: Abdominal pain with nausea and vomiting.  ACUTE ABDOMEN SERIES (ABDOMEN 2 VIEW & CHEST 1 VIEW)  Comparison: Chest x-ray dated 05/20/2009  Findings: Heart and lungs are normal.  No free air or free fluid in the abdomen.  Air scattered throughout nondistended loops of large and small bowel with fluid levels in the large and small bowel suggesting gastroenteritis.  Evidence of previous cholecystectomy.  No acute osseous abnormality.  IMPRESSION: There are fluid levels scattered throughout nondistended large and small bowel suggestive of gastroenteritis.  Original Report Authenticated By: Gwynn Burly, M.D.    Medications: I have reviewed the patient's current medications.  Assessment/Plan:  Active Problems:  Diarrhea Remains persistent- 7700 cc out yesterday despite 2 doses of Imodium which pt endorses slowed but did not stop the diarrhea. C. Difficile PCR negative and stool culture pending. Fecal lactoferrin is positive. Possible AGE made worse by PO Cipro. In the event she has non-specific colitis -continue Cipro to IV route and IV Flagyl. Appreciate GI assistance. Still with some blood in stools since admission. Will give 3 doses of Imodium today. ? add probiotic med- defer to GI. CT abdomen/pelvis negative and unrevealing.   Hypotension/SIRS BP still soft and >110 systolic. Likely due to dehydration. Will continue to treat underlying causes and provide supportive care.Since still with high volume diarrhea will continue IVF at  200cc/hr. Appreciate PCCM assistance.  PCT and lactic acid are normal so doubtful this has a bacterial origin. Blood cultures pending.   Acute renal failure Multi-factorial due to use of ACE I with HCTZ pre admit, dehydration and associated hypotension. With hold of offending meds, restoration of blood volume and BP creatinine now down to 0.89.   Hypokalemia 1230 pm-New problem- will add KCL to maintenance IVF. RESOLVED but watch with continued diarrhea/GI losses  Leukocytosis Trend up slightly- likely reactive but will follow- no fever. UA negative except for microscopic hematuria.   Dehydration, severe Will place foley to follow accurate UOP.  Hypertension Now with relative hypotension- continue to hold home medications.   UTI (lower urinary tract infection) Reportedly treated as this pre admit. UA unrevealing but was on antibiotics pre admit so need to follow up on culture.    ASTHMA Currently stable without wheezing or SOB.  Disposition Remain in SDU.   LOS: 2 days   ELLIS,ALLISON L. 11/29/2010, 9:15 AM    I have personally examined the patient and reviewed the entire database. Agree with the above note and plan as outlined by Ms. Rennis Harding, NP.   Thad Ranger 11/29/2010 11:55 AM  1420 Clarification: IVF is at 200 cc/hr not 150 cc/hr  Ahlijah Raia 11/29/2010, 3:28 PM

## 2010-11-30 ENCOUNTER — Inpatient Hospital Stay (HOSPITAL_COMMUNITY): Payer: PRIVATE HEALTH INSURANCE

## 2010-11-30 DIAGNOSIS — D869 Sarcoidosis, unspecified: Secondary | ICD-10-CM

## 2010-11-30 DIAGNOSIS — J449 Chronic obstructive pulmonary disease, unspecified: Secondary | ICD-10-CM

## 2010-11-30 DIAGNOSIS — J45901 Unspecified asthma with (acute) exacerbation: Secondary | ICD-10-CM

## 2010-11-30 LAB — CBC
HCT: 41.2 % (ref 36.0–46.0)
Hemoglobin: 15.1 g/dL — ABNORMAL HIGH (ref 12.0–15.0)
MCH: 32 pg (ref 26.0–34.0)
MCHC: 36.7 g/dL — ABNORMAL HIGH (ref 30.0–36.0)
MCV: 87.3 fL (ref 78.0–100.0)
Platelets: 193 10*3/uL (ref 150–400)
RBC: 4.72 MIL/uL (ref 3.87–5.11)
RDW: 12.9 % (ref 11.5–15.5)
WBC: 22.4 10*3/uL — ABNORMAL HIGH (ref 4.0–10.5)

## 2010-11-30 LAB — DIFFERENTIAL
Basophils Absolute: 0 10*3/uL (ref 0.0–0.1)
Basophils Relative: 0 % (ref 0–1)
Eosinophils Absolute: 1.8 10*3/uL — ABNORMAL HIGH (ref 0.0–0.7)
Eosinophils Relative: 8 % — ABNORMAL HIGH (ref 0–5)
Lymphocytes Relative: 7 % — ABNORMAL LOW (ref 12–46)
Lymphs Abs: 1.6 10*3/uL (ref 0.7–4.0)
Monocytes Absolute: 0.7 10*3/uL (ref 0.1–1.0)
Monocytes Relative: 3 % (ref 3–12)
Neutro Abs: 18.3 10*3/uL — ABNORMAL HIGH (ref 1.7–7.7)
Neutrophils Relative %: 82 % — ABNORMAL HIGH (ref 43–77)

## 2010-11-30 LAB — PRO B NATRIURETIC PEPTIDE: Pro B Natriuretic peptide (BNP): 7275 pg/mL — ABNORMAL HIGH (ref 0–125)

## 2010-11-30 LAB — BASIC METABOLIC PANEL
BUN: 12 mg/dL (ref 6–23)
CO2: 22 mEq/L (ref 19–32)
Calcium: 8.3 mg/dL — ABNORMAL LOW (ref 8.4–10.5)
Chloride: 100 mEq/L (ref 96–112)
Creatinine, Ser: 0.76 mg/dL (ref 0.50–1.10)
GFR calc Af Amer: >90 mL/min (ref >90)
GFR calc non Af Amer: >90 mL/min (ref >90)
Glucose, Bld: 163 mg/dL — ABNORMAL HIGH (ref 70–99)
Potassium: 3.8 mEq/L (ref 3.5–5.1)
Sodium: 133 mEq/L — ABNORMAL LOW (ref 135–145)

## 2010-11-30 LAB — STOOL CULTURE

## 2010-11-30 MED ORDER — BECLOMETHASONE DIPROPIONATE 80 MCG/ACT IN AERS
2.0000 | INHALATION_SPRAY | Freq: Two times a day (BID) | RESPIRATORY_TRACT | Status: DC
  Filled 2010-11-30: qty 8.7

## 2010-11-30 MED ORDER — PANTOPRAZOLE SODIUM 40 MG PO TBEC
40.0000 mg | DELAYED_RELEASE_TABLET | Freq: Every day | ORAL | Status: DC
  Administered 2010-12-01: 40 mg via ORAL
  Filled 2010-11-30: qty 1

## 2010-11-30 MED ORDER — ALBUTEROL (5 MG/ML) CONTINUOUS INHALATION SOLN
7.5000 mg/h | INHALATION_SOLUTION | Freq: Once | RESPIRATORY_TRACT | Status: AC
  Administered 2010-11-30: 7.5 mg/h via RESPIRATORY_TRACT
  Filled 2010-11-30: qty 20

## 2010-11-30 MED ORDER — FUROSEMIDE 40 MG PO TABS: 40.0000 mg | ORAL_TABLET | Freq: Once | ORAL | Status: DC

## 2010-11-30 MED ORDER — ALBUTEROL (5 MG/ML) CONTINUOUS INHALATION SOLN
7.5000 mg/h | INHALATION_SOLUTION | RESPIRATORY_TRACT | Status: DC
  Filled 2010-11-30: qty 20

## 2010-11-30 MED ORDER — METHYLPREDNISOLONE SODIUM SUCC 125 MG IJ SOLR: 80.0000 mg | Freq: Four times a day (QID) | INTRAMUSCULAR | Status: DC

## 2010-11-30 MED ORDER — IPRATROPIUM BROMIDE 0.02 % IN SOLN
0.5000 mg | RESPIRATORY_TRACT | Status: DC
  Administered 2010-11-30 (×2): 0.5 mg via RESPIRATORY_TRACT
  Filled 2010-11-30 (×2): qty 2.5

## 2010-11-30 MED ORDER — FUROSEMIDE 10 MG/ML IJ SOLN
INTRAMUSCULAR | Status: AC
  Administered 2010-11-30: 40 mg via INTRAMUSCULAR
  Filled 2010-11-30: qty 4

## 2010-11-30 MED ORDER — FUROSEMIDE 10 MG/ML IJ SOLN
40.0000 mg | Freq: Once | INTRAMUSCULAR | Status: AC
  Administered 2010-11-30: 40 mg via INTRAMUSCULAR

## 2010-11-30 MED ORDER — ALBUTEROL SULFATE (5 MG/ML) 0.5% IN NEBU
2.5000 mg | INHALATION_SOLUTION | RESPIRATORY_TRACT | Status: DC
  Administered 2010-11-30: 2.5 mg via RESPIRATORY_TRACT
  Filled 2010-11-30: qty 0.5
  Filled 2010-11-30: qty 1.5

## 2010-11-30 MED ORDER — METHYLPREDNISOLONE SODIUM SUCC 125 MG IJ SOLR
60.0000 mg | Freq: Four times a day (QID) | INTRAMUSCULAR | Status: DC
  Administered 2010-11-30: 60 mg via INTRAVENOUS
  Administered 2010-11-30: 17:00:00 via INTRAVENOUS
  Administered 2010-12-01 (×2): 60 mg via INTRAVENOUS
  Filled 2010-11-30 (×5): qty 0.96

## 2010-11-30 MED ORDER — IPRATROPIUM BROMIDE 0.02 % IN SOLN
0.5000 mg | Freq: Four times a day (QID) | RESPIRATORY_TRACT | Status: DC
  Administered 2010-11-30 – 2010-12-01 (×3): 0.5 mg via RESPIRATORY_TRACT
  Filled 2010-11-30 (×4): qty 2.5

## 2010-11-30 MED ORDER — LEVALBUTEROL HCL 0.63 MG/3ML IN NEBU
0.6300 mg | INHALATION_SOLUTION | Freq: Four times a day (QID) | RESPIRATORY_TRACT | Status: DC
  Administered 2010-11-30 – 2010-12-01 (×3): 0.63 mg via RESPIRATORY_TRACT
  Filled 2010-11-30 (×10): qty 3

## 2010-11-30 MED ORDER — DICYCLOMINE HCL 20 MG PO TABS
20.0000 mg | ORAL_TABLET | Freq: Three times a day (TID) | ORAL | Status: DC
  Administered 2010-11-30 – 2010-12-01 (×3): 20 mg via ORAL
  Filled 2010-11-30 (×6): qty 1

## 2010-11-30 MED ORDER — ALBUTEROL (5 MG/ML) CONTINUOUS INHALATION SOLN: 7.5000 mg/h | INHALATION_SOLUTION | RESPIRATORY_TRACT | Status: DC

## 2010-11-30 MED ORDER — METHYLPREDNISOLONE SODIUM SUCC 125 MG IJ SOLR
INTRAMUSCULAR | Status: AC
  Administered 2010-11-30: 80 mg via INTRAVENOUS
  Filled 2010-11-30: qty 2

## 2010-11-30 MED ORDER — METHYLPREDNISOLONE SODIUM SUCC 125 MG IJ SOLR
80.0000 mg | Freq: Four times a day (QID) | INTRAMUSCULAR | Status: DC
  Administered 2010-11-30 (×2): 80 mg via INTRAVENOUS
  Filled 2010-11-30 (×5): qty 1.28

## 2010-11-30 NOTE — Progress Notes (Signed)
Subjective: We have been asked to come see the patient again today because of a questionable issue with "asthma".  The patient tells me she was diagnosed with asthma approximately 5 years ago, but has only been on as needed beta agonist.  She has never been on maintenance inhaled corticosteroids.  She had an episode overnight where there was audible wheezing, and the patient felt that she was more short of breath than usual.  It should be noted the patient was on an ACE inhibitor prior to admission.  Currently she is in no respiratory distress, has excellent sats on room air, and feels that she is back to her usual baseline.  Objective: Vital signs in last 24 hours: Blood pressure 114/53, pulse 100, temperature 97.8 F (36.6 C), temperature source Oral, resp. rate 23, height 5\' 6"  (1.676 m), weight 120.5 kg (265 lb 10.5 oz), SpO2 95.00%.  Intake/Output from previous day: 11/21 0701 - 11/22 0700 In: 2740 [P.O.:840; I.V.:1400; IV Piggyback:500] Out: 1610 [RUEAV:4098; Stool:4400]   Physical Exam:   morbidly obese female in no acute distress Chest is totally clear to auscultation, no wheezes or rhonchi Cardiac exam with regular rate and rhythm Lower extremities with minimal edema, no cyanosis noted. Alert and oriented, moves all 4 extremities.   Lab Results:  Basename 11/30/10 0314 11/29/10 0520 11/28/10 0900  WBC 22.4* 20.6* 17.0*  HGB 15.1* 14.8 14.6  HCT 41.2 42.1 42.4  PLT 193 207 253   BMET  Basename 11/30/10 0314 11/29/10 0520 11/28/10 0900  NA 133* 131* 133*  K 3.8 4.1 3.1*  CL 100 98 98  CO2 22 21 23   GLUCOSE 163* 108* 139*  BUN 12 17 34*  CREATININE 0.76 0.82 1.87*  CALCIUM 8.3* 8.3* 8.2*    Studies/Results: Ct Abdomen Pelvis Wo Contrast  11/28/2010  *RADIOLOGY REPORT*  Clinical Data: Abdominal pain.  Diarrhea and distension.  CT ABDOMEN AND PELVIS WITHOUT CONTRAST  Technique:  Multidetector CT imaging of the abdomen and pelvis was performed following the standard  protocol without intravenous contrast.  Comparison: No comparison exams available for direct review. Report from 09/06/1999 exam is available.  Findings: Within the limitation of unenhanced imaging, no focal hepatic, splenic, pancreatic, renal or right adrenal lesion is noted.  Hyperplasia of the left adrenal gland may be related to adenomatous changes.  The mild lobulated contour of the anterior aspect of the liver is of questionable significance as there are no secondary findings to otherwise suggest cirrhosis.  Clinical and laboratory correlation recommended.  Mild fatty infiltration liver. Post cholecystectomy.  No extraluminal bowel inflammatory process or free air.  Post hysterectomy.  Decompressed urinary bladder with Foley catheter in place.  Fatty containing right inguinal and periumbilical hernia.  Advanced atherosclerotic type changes for the patient's age with calcification of the aorta and branch vessels.  No abdominal aortic aneurysm.  Increased number of normal size to slightly enlarged retroperitoneal lymph nodes.  Slight haziness lung bases may represent chronic changes versus mild pneumonitis type changes without focal nodularity.  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 Degenerative change L4-5 facet joint.  IMPRESSION: No extraluminal bowel inflammatory process noted.  Enlarged adrenal gland suggestive of hyperplasia.  Mild fatty infiltration of the liver with slightly lobulated anterior contour without other findings of cirrhosis.  Clinical and laboratory correlation recommended.  Increased number of normal size to slightly enlarged retroperitoneal lymph nodes.  Etiology indeterminate.  Degenerative changes L4-5 and L5-S1 as detailed above.  Advanced atherosclerotic type  changes aorta and iliac arteries.  Original Report Authenticated By: Fuller Canada, M.D.   Dg Chest Port 1 View  11/30/2010  *RADIOLOGY REPORT*  Clinical Data: Shortness of breath, swelling.   PORTABLE CHEST - 1 VIEW  Comparison: 07/14/2009 CT  Findings: Hazy bibasilar opacities.  Heart size within the upper normal limits.  Central vascular congestion.  No pleural effusion or pneumothorax.  No acute osseous abnormality.  IMPRESSION: Hazy bibasilar opacities are nonspecific, may represent atelectasis, edema, or infiltrate.  Original Report Authenticated By: Waneta Martins, M.D.    Assessment/Plan: Patient Active Hospital Problem List:  ASTHMA (05/18/2009)    Assessment: the patient has a history of COPD, and has been seen by Dr. Delton Coombes in the pulmonary clinic.  Her description of her event overnight, this sounds much more upper airway and the origin with pseudo-wheezing than true bronchospasm.  The patient has been on an ACE inhibitor that takes up to 8 weeks to get out of her system, and is at high risk for reflux disease as well.  She also has a history that is suggestive of sarcoidosis.  At this point, I would treat her for the upper airway causes of pseudo wheezing, and also get her off IV steroids within 24 hours and start inhaled corticosteroids.    Plan:  Would recommend getting the patient completely off the IV steroids within 24 hours given that her lungs were totally clear today with no distress. Would start her on Qvar 80 mcg 2 puffs b.i.d. While in the hospital and send her home on this medication. Would not send her home on an ACE inhibitor at this point Consider empirically treating with a proton pump inhibitor for possible LPR. She will need followup with Dr. Delton Coombes as an outpatient.  Please call if we can be of further assistance.     Barbaraann Share, M.D. 11/30/2010, 12:03 PM

## 2010-11-30 NOTE — Progress Notes (Signed)
Subjective: Diarrhea improving, x3 last night, x1 this morning per patient, still watery. No abdominal pain, fevers, hematochezia or melena. Appetite returning. Overnight issues noted with asthma exacerbation.  Objective: Vital signs in last 24 hours: Temp:  [97.8 F (36.6 C)-99.2 F (37.3 C)] 97.8 F (36.6 C) (11/22 0820) Pulse Rate:  [96-111] 100  (11/22 0600) Resp:  [14-40] 23  (11/22 0600) BP: (93-171)/(28-72) 114/53 mmHg (11/22 0820) SpO2:  [91 %-100 %] 95 % (11/22 0600) Weight:  [120.5 kg (265 lb 10.5 oz)-120.9 kg (266 lb 8.6 oz)] 265 lb 10.5 oz (120.5 kg) (11/22 0500) Weight change: 0.8 kg (1 lb 12.2 oz) Last BM Date: 11/29/10  Intake/Output from previous day: 11/21 0701 - 11/22 0700 In: 2740 [P.O.:840; I.V.:1400; IV Piggyback:500] Out: 6375 [Urine:1975; Stool:4400] Intake/Output this shift: Total I/O In: -  Out: 450 [Urine:450]  General appearance: alert, cooperative, appears stated age and no distress Resp: Decreased breath sound at the bases, scattered wheezing Cardio: regular rate and rhythm, S1, S2 normal, no murmur, click, rub or gallop, IVF at 150/hr. NSR GI: soft, non-tender; bowel sounds normal; no masses,  no organomegaly, large volume bilious diarrhea persists Extremities: extremities normal, atraumatic, no cyanosis or edema but hands and feet are cool to the touch. Neurologic: Alert and oriented X 3, normal strength and tone. Normal symmetric reflexes. Normal coordination and gait  Lab Results:  [facility] 11/30/10 0314 11/29/10 0520  WBC 22.4* 20.6*  HGB 15.1* 14.8  HCT 41.2 42.1  PLT 193 207   BMET  Basename 11/30/10 0314 11/29/10 0520  NA 133* 131*  K 3.8 4.1  CL 100 98  CO2 22 21  GLUCOSE 163* 108*  BUN 12 17  CREATININE 0.76 0.82  CALCIUM 8.3* 8.3*    Studies/Results: Ct Abdomen Pelvis Wo Contrast  11/28/2010  *RADIOLOGY REPORT*  Clinical Data: Abdominal pain.  Diarrhea and distension.  CT ABDOMEN AND PELVIS WITHOUT CONTRAST  Technique:   Multidetector CT imaging of the abdomen and pelvis was performed following the standard protocol without intravenous contrast.  Comparison: No comparison exams available for direct review. Report from 09/06/1999 exam is available.  Findings: Within the limitation of unenhanced imaging, no focal hepatic, splenic, pancreatic, renal or right adrenal lesion is noted.  Hyperplasia of the left adrenal gland may be related to adenomatous changes.  The mild lobulated contour of the anterior aspect of the liver is of questionable significance as there are no secondary findings to otherwise suggest cirrhosis.  Clinical and laboratory correlation recommended.  Mild fatty infiltration liver. Post cholecystectomy.  No extraluminal bowel inflammatory process or free air.  Post hysterectomy.  Decompressed urinary bladder with Foley catheter in place.  Fatty containing right inguinal and periumbilical hernia.  Advanced atherosclerotic type changes for the patient's age with calcification of the aorta and branch vessels.  No abdominal aortic aneurysm.  Increased number of normal size to slightly enlarged retroperitoneal lymph nodes.  Slight haziness lung bases may represent chronic changes versus mild pneumonitis type changes without focal nodularity.  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 Degenerative change L4-5 facet joint.  IMPRESSION: No extraluminal bowel inflammatory process noted.  Enlarged adrenal gland suggestive of hyperplasia.  Mild fatty infiltration of the liver with slightly lobulated anterior contour without other findings of cirrhosis.  Clinical and laboratory correlation recommended.  Increased number of normal size to slightly enlarged retroperitoneal lymph nodes.  Etiology indeterminate.  Degenerative changes L4-5 and L5-S1 as detailed above.  Advanced atherosclerotic type changes  aorta and iliac arteries.  Original Report Authenticated By: Fuller Canada, M.D.   Dg Chest Port 1  View  11/30/2010  *RADIOLOGY REPORT*  Clinical Data: Shortness of breath, swelling.  PORTABLE CHEST - 1 VIEW  Comparison: 07/14/2009 CT  Findings: Hazy bibasilar opacities.  Heart size within the upper normal limits.  Central vascular congestion.  No pleural effusion or pneumothorax.  No acute osseous abnormality.  IMPRESSION: Hazy bibasilar opacities are nonspecific, may represent atelectasis, edema, or infiltrate.  Original Report Authenticated By: Waneta Martins, M.D.    Medications: I have reviewed the patient's current medications.  Flexible sigmoidoscopy 11/29/2010: (Dr. Juanda Chance) Flexible sigmoidoscopy without prep shows high volume watery diarrhea, diffuse mild inflammation of the colon mucosa with scattered superficial erosions which could be secondary to diarrhea. Bx's taken. Suspect small bowl secretory diarrhea   Assessment/Plan:  Active Problems:  Diarrhea: Improving - Flexible sigmoidoscopy on 11/29/2010 showed diffuse mild inflammation of colon mucosa with scattered superficial erosions, on ciprofloxacin and Flagyl. GI following., will need recommendation regarding Sandostatin for disposition. - C. difficile PCR negative, fecal lactoferrin is positive. - Symptoms improved with Sandostatin and Imodium when necessary. - Advance diet to lactose-free diet today   Hypotension/SIRS/ dehydration: Improved - DC'd IV fluids due to fluid overload, asthma exacerbation   Acute renal failure: Resolved Multi-factorial due to use of ACE I with HCTZ pre admit, dehydration and associated hypotension.   Hypokalemia: Resolved  Leukocytosis Trend up slightly- likely reactive and now on Solu-Medrol due to acute asthma exacerbation.   Presumed UTI (lower urinary tract infection) Reportedly treated as this pre admit. UA unrevealing but was on antibiotics pre admit, urine culture showed no growth    Acute ASTHMA exacerbation and volume overload - Continue Xopenex and Atrovent nebulizers,  Solu-Medrol tapered, symptoms significantly improved after one dose of Lasix and discontinuing the IV fluids.  Disposition Transfer to telemetry monitored floor, hopefully DC in a.m. if remains stable.   LOS: 3 days   Alexandra Bell 11/30/2010, 10:28 AM

## 2010-11-30 NOTE — Progress Notes (Signed)
Peak flow on average 150 at this time. Peak flow done times 4. Pt was still slightly sleepy when peak flow was done. RT will continue to monitor.

## 2010-11-30 NOTE — Progress Notes (Signed)
Patient exhibited wheezing bilaterally in upper and lower lobes at beginning of shift.  PRN albuterol inhaler treatment given and patient reported relief.  At 2320 MD notified of increase in dyspnea and wheezing. Stat PCXR ordered and fluids discontinued.  Lasix and additional albuterol treatment given per MD order. Patient closely monitored. Continued to have a respiratory rate in the upper 20's-30. Solu-Medrol ordered. The first dose was given at 0247.  Peak flow measured.  MD ordered continuous Albuterol/Atrovent nebulizer treatment.   RT and nursing staff will continue to monitor patient.

## 2010-11-30 NOTE — Progress Notes (Signed)
6:21 AM [facility] "X" Cover:- (Chart reviewed + today's progress notes reviewed)    Patient significantly better with continuous nebulization.  Although BNP elevated, Specificity is very poor for this test. Clinically had no JVD initially Spoke earlier with Dr. Bard Herbert, CCM, and asked for some recommendations-patient likely will need scheduled steroids Iv for today and review with CCM to streamline her care She is much much better now, her RR has dropped to the mid 20's and she appears to be fine Will report off to the Am team concerns.  [name], MD [facility] [phone]

## 2010-11-30 NOTE — Progress Notes (Addendum)
1:23 AM [facility] "X" Cover:- (Chart reviewed + today's progress notes reviewed)  Patient reviewed at bedside She feels "much better" and feels that she can sleep now after an inhaler Rx, however while asleep, still breathing about 28-30 times @ rest CXR reviewed shows possible atelectasis, edema, or infiltrate.  Will give Iv lasix 1 dose and get Peak FLow Will review  [name], MD [facility] [phone]

## 2010-11-30 NOTE — Progress Notes (Signed)
     [facility] Daily Rounding Note 11/30/2010, 9:07 AM  SUBJECTIVE: Feeling better.  Much less diarrhea.  On Sandostatin SQ and immodium PRN.  Appetite returning. OBJECTIVE: Much better spirits.  Looks well. Vital signs in last 24 hours: Temp:  [97.8 F (36.6 C)-99.2 F (37.3 C)] 97.8 F (36.6 C) (11/22 0820) Pulse Rate:  [96-111] 100  (11/22 0600) Resp:  [14-40] 23  (11/22 0600) BP: (93-171)/(28-72) 114/53 mmHg (11/22 0820) SpO2:  [91 %-100 %] 95 % (11/22 0600) Weight:  [120.5 kg (265 lb 10.5 oz)-120.9 kg (266 lb 8.6 oz)] 265 lb 10.5 oz (120.5 kg) (11/22 0500) Last BM Date: 11/29/10  Heart: RRR 1/6 SEM.   Chest: Clear B Abdomen: Soft, NT, ND.  Active BS  Extremities: No edema Neuro/Psych:  Pleasant, relaxed.  Not confused.  Intake/Output from previous day: 11/21 0701 - 11/22 0700 In: 2740 [P.O.:840; I.V.:1400; IV Piggyback:500] Out: 0454 [UJWJX:9147; Stool:4400]  Intake/Output this shift: Total I/O In: -  Out: 450 [Urine:450]  Lab Results:  [facility] 11/30/10 0314 11/29/10 0520 11/28/10 0900  WBC 22.4* 20.6* 17.0*  HGB 15.1* 14.8 14.6  HCT 41.2 42.1 42.4  PLT 193 207 253   BMET  Basename 11/30/10 0314 11/29/10 0520 11/28/10 0900  NA 133* 131* 133*  K 3.8 4.1 3.1*  CL 100 98 98  CO2 22 21 23   GLUCOSE 163* 108* 139*  BUN 12 17 34*  CREATININE 0.76 0.82 1.87*  CALCIUM 8.3* 8.3* 8.2*   Dg Chest Port 1 View  11/30/2010  *RADIOLOGY REPORT*  Clinical Data: Shortness of breath, swelling.  PORTABLE CHEST - 1 VIEW  Comparison: 07/14/2009 CT  Findings: Hazy bibasilar opacities.  Heart size within the upper normal limits.  Central vascular congestion.  No pleural effusion or pneumothorax.  No acute osseous abnormality.  IMPRESSION: Hazy bibasilar opacities are nonspecific, may represent atelectasis, edema, or infiltrate.  Original Report Authenticated By: Waneta Martins, M.D.    ASSESMENT: 1.   Diarrhea, etiology unclear but suspect SB source/enteritis.  Mild  colonic inflammation felt to be secondary to the diarrhea and not the primary cause.  Bxs pending ( may be late Monday before we have Path) . Sxs improved with Sandostatin and Immodium prn.  Celiac profile Pending2.   2. Glucose intolerance. 3.  Hyponatremia, mild. 4.  Asthma, started Solumedrol yesterday and it is likely causing rise in sugars. No problems breathing.  PLAN: 1.  Lactose free diet.  2.  When to wean the Octreotide? 3.  When to D/C the Cipro and Flagyl and Solumedrol.  I am inclined to D/C all but defer to MDs.   LOS: 3 days   Jennye Moccasin  11/30/2010, 9:07 AM

## 2010-11-30 NOTE — Progress Notes (Signed)
I have personally reviewed the above note and agree with the plan of care. Iyona Pehrson, MD 

## 2010-12-01 ENCOUNTER — Encounter: Payer: Self-pay | Admitting: Internal Medicine

## 2010-12-01 LAB — BASIC METABOLIC PANEL
BUN: 36 mg/dL — ABNORMAL HIGH (ref 6–23)
CO2: 25 mEq/L (ref 19–32)
Calcium: 9.2 mg/dL (ref 8.4–10.5)
Chloride: 97 mEq/L (ref 96–112)
Creatinine, Ser: 1.32 mg/dL — ABNORMAL HIGH (ref 0.50–1.10)
GFR calc Af Amer: 55 mL/min — ABNORMAL LOW (ref >90)
GFR calc non Af Amer: 48 mL/min — ABNORMAL LOW (ref >90)
Glucose, Bld: 149 mg/dL — ABNORMAL HIGH (ref 70–99)
Potassium: 3.5 mEq/L (ref 3.5–5.1)
Sodium: 135 mEq/L (ref 135–145)

## 2010-12-01 LAB — CBC
HCT: 42.7 % (ref 36.0–46.0)
Hemoglobin: 14.6 g/dL (ref 12.0–15.0)
MCH: 30.1 pg (ref 26.0–34.0)
MCHC: 34.2 g/dL (ref 30.0–36.0)
MCV: 88 fL (ref 78.0–100.0)
Platelets: 201 10*3/uL (ref 150–400)
RBC: 4.85 MIL/uL (ref 3.87–5.11)
RDW: 13 % (ref 11.5–15.5)
WBC: 25.5 10*3/uL — ABNORMAL HIGH (ref 4.0–10.5)

## 2010-12-01 LAB — GLUCOSE, CAPILLARY: Glucose-Capillary: 158 mg/dL — ABNORMAL HIGH (ref 70–99)

## 2010-12-01 MED ORDER — FLUTICASONE PROPIONATE HFA 44 MCG/ACT IN AERO
2.0000 | INHALATION_SPRAY | Freq: Two times a day (BID) | RESPIRATORY_TRACT | Status: DC
  Filled 2010-12-01: qty 10.6

## 2010-12-01 MED ORDER — PANTOPRAZOLE SODIUM 40 MG PO TBEC: 40.0000 mg | DELAYED_RELEASE_TABLET | Freq: Every day | ORAL | Status: DC

## 2010-12-01 MED ORDER — FLUTICASONE PROPIONATE HFA 44 MCG/ACT IN AERO: 2.0000 | INHALATION_SPRAY | Freq: Two times a day (BID) | RESPIRATORY_TRACT | Status: DC

## 2010-12-01 MED ORDER — PREDNISONE 5 MG PO TABS: 5.0000 mg | ORAL_TABLET | Freq: Every day | ORAL | Status: AC

## 2010-12-01 MED ORDER — ALBUTEROL SULFATE HFA 108 (90 BASE) MCG/ACT IN AERS: 2.0000 | INHALATION_SPRAY | Freq: Four times a day (QID) | RESPIRATORY_TRACT | Status: DC | PRN

## 2010-12-01 NOTE — Progress Notes (Signed)
Acute secretory diarrhea much improved, 1 watery stool today. 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 eosinophilic enteritis, parasitic infection ( unlikely) or allergic reaction.She is clearly better on IV steroids which were given for asthmatic bronchitis. I have discussed it with the pt and Dr Isidoro Donning and suggest to switch her to oral steroids Prednisone 30mg /day, tapered by 5 mg every 3-5 days. Follow up with [facility] [phone] Dr Juanda Chance. Pt may need a small bowl biopsy to assess small bowl eosinophilia. Her peripheral eos count has been elevated.

## 2010-12-01 NOTE — Progress Notes (Signed)
     [facility] Daily Rounding Note 12/01/2010, 9:20 AM  SUBJECTIVE: 3 or so Loose , small BMs yesterday.  None between 10 PM and 7 AM.  One this AM.  Feels better.  Wondering about when she might go home.  Eating solids. Occasional mid abdominal cramps. Preliminary pathology showing eosinophillic infiltrates in colon bx.  OBJECTIVE: Looks well.  Currently receiving Nebulizer. Vital signs in last 24 hours: Temp:  [97.4 F (36.3 C)-98.2 F (36.8 C)] 97.5 F (36.4 C) (11/23 0504) Pulse Rate:  [96-103] 96  (11/23 0504) Resp:  [18-22] 18  (11/23 0504) BP: (99-109)/(47-68) 99/64 mmHg (11/23 0504) SpO2:  [92 %-96 %] 96 % (11/23 0904) Weight:  [117.4 kg (258 lb 13.1 oz)] 258 lb 13.1 oz (117.4 kg) (11/23 0510) Last BM Date: 12/01/10  Heart: RRR Chest: Clear in front Abdomen: Soft, NT, ND, Active BS  Extremities: No edema Neuro/Psych:  Pleasant, alert, sooperativ.  No gross deficits  Intake/Output from previous day: 11/22 0701 - 11/23 0700 In: 780 [P.O.:480; IV Piggyback:300] Out: 800 [Urine:800]  Intake/Output this shift:    Lab Results:  Basename 12/01/10 0600 11/30/10 0314 11/29/10 0520  WBC 25.5* 22.4* 20.6*  HGB 14.6 15.1* 14.8  HCT 42.7 41.2 42.1  PLT 201 193 207    Basename 12/01/10 0600 11/30/10 0314 11/29/10 0520  NA 135 133* 131*  K 3.5 3.8 4.1  CL 97 100 98  CO2 25 22 21   GLUCOSE 149* 163* 108*  BUN 36* 12 17  CREATININE 1.32* 0.76 0.82  CALCIUM 9.2 8.3* 8.3*    Studies/Results: Dg Chest Port 1 View  11/30/2010  *RADIOLOGY REPORT*  Clinical Data: Shortness of breath, swelling.  PORTABLE CHEST - 1 VIEW  Comparison: 07/14/2009 CT  Findings: Hazy bibasilar opacities.  Heart size within the upper normal limits.  Central vascular congestion.  No pleural effusion or pneumothorax.  No acute osseous abnormality.  IMPRESSION: Hazy bibasilar opacities are nonspecific, may represent atelectasis, edema, or infiltrate.  Original Report Authenticated By: Waneta Martins, M.D.    ASSESMENT: 1. Diarrhea. Bxs reveal possible allergic enteritis. Sxs improved on empiric Sandostatin, solumedrol (started for asthma) and prn Immodium. Celiac profile Pending.  2. Glucose intolerance.  3. Hyponatremia, mild.  4. Asthma, started Solumedrol yesterday and it is likely causing rise in sugars    PLAN: 1. Stop abx and sandostatin.  Continue steroids but transition to oral prednisone and begin taper.  Ok to use PRN Immodium.  Could d/c home.  Should fup with Dr. Juanda Chance in 10 to 20 days.   LOS: 4 days   Jennye Moccasin  12/01/2010, 9:20 AM

## 2010-12-01 NOTE — Discharge Summary (Signed)
Physician Discharge Summary  Patient ID: Alexandra Bell MRN: [id number] DOB/AGE: 1965/02/21 45 y.o.  Admit date: 11/27/2010 Discharge date: 12/01/2010  Primary Care Physician:  Dorrene German, MD  Discharge Diagnoses:    .Diarrhea/ acute eosinophilic gastroenteritis: Improving  .Hypotension: Resolved  .Acute renal failure: Creatinine 1.87 at the time of admission, improved to 1.3 at the time of discharge,Improving  . acute asthma exacerbation: Resolved  .Dehydration, severe: Resolved  .Leukocytosis: Secondary to acute eosinophilic gastroenteritis  .SIRS (systemic inflammatory response syndrome): Resolved  Consults: Gastroenterology (Dr. Lina Sar)                   Pulmonology (Dr. Marcelyn Bruins)   Discharge Medications: Discharge Medication List as of 12/01/2010 11:52 AM    START taking these medications   Details  fluticasone (FLOVENT HFA) 44 MCG/ACT inhaler Inhale 2 puffs into the lungs 2 (two) times daily., Starting 12/01/2010, Until Sat 12/01/11, Print    pantoprazole (PROTONIX) 40 MG tablet Take 1 tablet (40 mg total) by mouth daily at 6 (six) AM., Starting 12/01/2010, Until Sat 12/01/11, Print    predniSONE (DELTASONE) 5 MG tablet Take 1 tablet (5 mg total) by mouth daily., Starting 12/01/2010, Until Mon 12/11/10, Print      CONTINUE these medications which have CHANGED   Details  albuterol (PROVENTIL HFA;VENTOLIN HFA) 108 (90 BASE) MCG/ACT inhaler Inhale 2 puffs into the lungs every 6 (six) hours as needed. For asthma, Starting 12/01/2010, Until Discontinued, Print      CONTINUE these medications which have NOT CHANGED   Details  acetaminophen (TYLENOL) 500 MG tablet Take 1,500 mg by mouth every 6 (six) hours as needed. For pain, Until Discontinued, Historical Med    aspirin EC 81 MG tablet Take 81 mg by mouth daily as needed. For pain , Until Discontinued, Historical Med    diphenoxylate-atropine (LOMOTIL) 2.5-0.025 MG per tablet Take 1 tablet by mouth 4  (four) times daily as needed. For diarrhea, Until Discontinued, Historical Med      STOP taking these medications     ciprofloxacin (CIPRO) 250 MG tablet      fish oil-omega-3 fatty acids 1000 MG capsule      lisinopril-hydrochlorothiazide (PRINZIDE,ZESTORETIC) 20-25 MG per tablet      POTASSIUM PO          Significant Diagnostic Studies:  Dg Abd 2 Views  11/28/2010  *RADIOLOGY REPORT*  Clinical Data: Diarrhea, abdominal pain.  ABDOMEN - 2 VIEW  Comparison: 11/27/2010  Findings: Clear lung bases.  Nonobstructive bowel gas pattern.  No free intraperitoneal air identified.  Surgical clips right upper quadrant.  Organ outlines normal where seen.  No acute osseous abnormality.  IMPRESSION: Nonobstructive bowel gas pattern.  Original Report Authenticated By: Waneta Martins, M.D.   Dg Abd Acute W/chest  11/27/2010  *RADIOLOGY REPORT*  Clinical Data: Abdominal pain with nausea and vomiting.  ACUTE ABDOMEN SERIES (ABDOMEN 2 VIEW & CHEST 1 VIEW)  Comparison: Chest x-ray dated 05/20/2009  Findings: Heart and lungs are normal.  No free air or free fluid in the abdomen.  Air scattered throughout nondistended loops of large and small bowel with fluid levels in the large and small bowel suggesting gastroenteritis.  Evidence of previous cholecystectomy.  No acute osseous abnormality.  IMPRESSION: There are fluid levels scattered throughout nondistended large and small bowel suggestive of gastroenteritis.  Original Report Authenticated By: Gwynn Burly, M.D.    Brief H and P: For complete details please refer to admission H and  P, but in brief patient is 44 year old female who presented to the hospital with worsening diarrhea for a week. Patient went to see her primary care physician who prescribed ciprofloxacin for presumed UTI. Patient stated that she continued to have diarrhea, nausea vomiting that started on the morning of admission. She also had shortness of breath which improved after  taking albuterol inhaler. She otherwise denied any chest pain but had abdominal pain. Patient was admitted for further workup.   Hospital Course:   Diarrhea: Significantly improved do to acute eosinophilic gastroenteritis. - Patient was admitted to the step down unit secondary to dehydration and associated hypotension, acute renal failure. Patient was aggressively hydrated with IV fluids. Stool studies were sent, fecal lactoferrin was positive however C. difficile was negative. Patient was started on Imodium with no significant improvement. Patient was also continued on ciprofloxacin and Flagyl which have been discontinued at the time of discharge per GI recommendations. Gastroenterology was consulted and patient underwent flexible sigmoidoscopy on 11/29/2010. Patient was started on octreotide and when necessary Imodium. Flexible sigmoidoscopy showed    diffuse mild inflammation of colon mucosa with scattered superficial erosions. Diet was advanced to lactose-free diet. At the time of discharge patient's diarrhea had improved and she was tolerating diet. Colon biopsies showed marked eosinophilic infiltration of the colon mucosa consistent with eosinophilic enteritis, question parasitic infection (which appears to be unlikely) or allergic reaction. Patient during the interim was placed on IV solumedrol for acute asthma exacerbation which also improved her symptoms of diarrhea. Per GI recommendation, continue patient on by mouth prednisone with taper which should help with the acute eosinophilic enteritis as well. She will followup with Dr. Lina Sar in 2 weeks. Patient may need a small bowel biopsy to assess for small bowel eosinophilia.  Hypotension/SIRS/ dehydration: Resolved patient was placed on aggressive IV fluid hydration   Acute renal failure: Resolved, multi-factorial due to use of ACE I with HCTZ pre admit, dehydration and associated hypotension.  Creatinine 1.87 at the time of admission which  has improved to 1.3 at the time of discharge. Patient was instructed to hold lisinopril and hydrochlorothiazide at the time of discharge for next few days until she has seen her PCP.  Acute asthma exacerbation: Resolved, on the night of 11/30/2010 patient had triggered an asthma exacerbation which required albuterol, Atrovent nebulizers, IV Solu-Medrol. Pulmonology was consulted, IV fluids were discontinued given the elevated BNP (likely scheduled to fluid overload). Patient was started on beclomethasone inhaled steroids per pulmonology recommendation and patient will follow up with Dr. Corliss Blacker in 2 weeks out-pt.   Hypokalemia: Resolved, secondary to diarrhea   Presumed UTI (lower urinary tract infection)  Reportedly treated as this pre admit. UA unrevealing but was on antibiotics pre admit, urine culture showed no growth     Day of Discharge BP 99/64  Pulse 96  Temp(Src) 97.5 F (36.4 C) (Oral)  Resp 18  Ht 5\' 6"  (1.676 m)  Wt 117.4 kg (258 lb 13.1 oz)  BMI 41.77 kg/m2  SpO2 96%  LAB RESULTS: Basic Metabolic Panel:  Lab 12/01/10 2956 11/30/10 0314  NA 135 133*  K 3.5 3.8  CL 97 100  CO2 25 22  GLUCOSE 149* 163*  BUN 36* 12  CREATININE 1.32* 0.76  CALCIUM 9.2 8.3*  MG -- --  PHOS -- --   Liver Function Tests:  Lab 11/27/10 1144  AST 23  ALT 27  ALKPHOS 97  BILITOT 0.4  PROT 8.5*  ALBUMIN 3.6  Lab 11/28/10 0900 11/27/10 1144  LIPASE 20 81*  AMYLASE -- --   CBC:  Lab 12/01/10 0600 11/30/10 0314  WBC 25.5* 22.4*  NEUTROABS -- 18.3*  HGB 14.6 15.1*  HCT 42.7 41.2  MCV 88.0 --  PLT 201 193   CBG:  Lab 12/01/10 0611  GLUCAP 158*     Physical Exam: General: Alert and awake oriented x3 not in any acute distress. HEENT: anicteric sclera, pupils reactive to light and accommodation CVS: S1-S2 clear no murmur rubs or gallops Chest: clear to auscultation bilaterally, no wheezing rales or rhonchi Abdomen: soft nontender, nondistended, normal bowel sounds,  no organomegaly Extremities: no cyanosis, clubbing or edema noted bilaterally Neuro: Cranial nerves II-XII intact, no focal neurological deficits   Disposition and Follow-up: Discharge Orders    Future Orders Please Complete By Expires   Diet - low sodium heart healthy      Increase activity slowly      Discharge instructions      Comments:   -Please HOLD BP meds (lisinopril- HCTZ) for 5 days, then f/u with PCP to resume -STOP taking OMEGA-3 fish oil caps -LACTOSE FREE DIET       DISPOSITION: Home  DIET: Lactose-free  ACTIVITY: As tolerated  DISCHARGE FOLLOW-UP Follow-up Information    Follow up with AVBUERE,EDWIN A. Make an appointment in 7 days.   Contact information:   [address] [place] [address] [phone]       Follow up with Lina Sar, MD. Make an appointment in 2 weeks. (  If symptoms worsen, please call for earlier appt)    Contact information:   [address] [facility] [address] [place] [address] [place] [address] [phone]       Follow up with Leslye Peer., MD. Make an appointment in 2 weeks.   Contact information:   [address] [place] [address] [phone]          Time spent on Discharge: 45 minutes  Signed: RAI,RIPUDEEP 12/01/2010, 12:59 PM

## 2010-12-04 ENCOUNTER — Encounter: Payer: Self-pay | Admitting: Internal Medicine

## 2010-12-04 LAB — CULTURE, BLOOD (ROUTINE X 2)
Culture  Setup Time: 201211200014
Culture  Setup Time: 201211200014
Culture: NO GROWTH
Culture: NO GROWTH

## 2010-12-04 LAB — TISSUE TRANSGLUTAMINASE, IGA: Tissue Transglutaminase Ab, IgA: 7.7 U/mL (ref <20)

## 2010-12-04 LAB — GLIADIN ANTIBODIES, SERUM
Gliadin IgA: 7 U/mL (ref <20)
Gliadin IgG: 6.6 U/mL (ref <20)

## 2010-12-04 NOTE — Progress Notes (Signed)
I have spoken with patient and have scheduled her for an office visit on 12/13/10 @ 8:45 am for follow up of colitis. Patient verbalizes understanding to appointment time, date and location.

## 2010-12-05 ENCOUNTER — Encounter (HOSPITAL_COMMUNITY): Payer: Self-pay | Admitting: Internal Medicine

## 2010-12-05 LAB — RETICULIN ANTIBODIES, IGA W TITER: Reticulin Ab, IgA: NEGATIVE

## 2010-12-13 ENCOUNTER — Ambulatory Visit (INDEPENDENT_AMBULATORY_CARE_PROVIDER_SITE_OTHER): Payer: PRIVATE HEALTH INSURANCE | Admitting: Internal Medicine

## 2010-12-13 ENCOUNTER — Other Ambulatory Visit (INDEPENDENT_AMBULATORY_CARE_PROVIDER_SITE_OTHER): Payer: PRIVATE HEALTH INSURANCE

## 2010-12-13 ENCOUNTER — Encounter: Payer: Self-pay | Admitting: Internal Medicine

## 2010-12-13 ENCOUNTER — Ambulatory Visit: Payer: PRIVATE HEALTH INSURANCE

## 2010-12-13 VITALS — BP 138/72 | HR 80 | Ht 66.0 in | Wt 273.0 lb

## 2010-12-13 DIAGNOSIS — K5282 Eosinophilic colitis: Secondary | ICD-10-CM

## 2010-12-13 DIAGNOSIS — K5289 Other specified noninfective gastroenteritis and colitis: Secondary | ICD-10-CM

## 2010-12-13 DIAGNOSIS — K529 Noninfective gastroenteritis and colitis, unspecified: Secondary | ICD-10-CM

## 2010-12-13 LAB — COMPREHENSIVE METABOLIC PANEL
ALT: 20 U/L (ref 0–35)
AST: 21 U/L (ref 0–37)
Albumin: 2.8 g/dL — ABNORMAL LOW (ref 3.5–5.2)
Alkaline Phosphatase: 116 U/L (ref 39–117)
BUN: 14 mg/dL (ref 6–23)
CO2: 27 mEq/L (ref 19–32)
Calcium: 9.2 mg/dL (ref 8.4–10.5)
Chloride: 105 mEq/L (ref 96–112)
Creatinine, Ser: 0.9 mg/dL (ref 0.4–1.2)
GFR: 91 mL/min (ref >60.00)
Glucose, Bld: 76 mg/dL (ref 70–99)
Potassium: 4.8 mEq/L (ref 3.5–5.1)
Sodium: 142 mEq/L (ref 135–145)
Total Bilirubin: 0.3 mg/dL (ref 0.3–1.2)
Total Protein: 7.5 g/dL (ref 6.0–8.3)

## 2010-12-13 LAB — CBC WITH DIFFERENTIAL/PLATELET
Basophils Absolute: 0.1 10*3/uL (ref 0.0–0.1)
Basophils Relative: 0.3 % (ref 0.0–3.0)
Eosinophils Absolute: 12.8 10*3/uL — ABNORMAL HIGH (ref 0.0–0.7)
Eosinophils Relative: 51 % — ABNORMAL HIGH (ref 0.0–5.0)
HCT: 35.2 % — ABNORMAL LOW (ref 36.0–46.0)
Hemoglobin: 11.6 g/dL — ABNORMAL LOW (ref 12.0–15.0)
Lymphocytes Relative: 14 % (ref 12.0–46.0)
Lymphs Abs: 3.5 10*3/uL (ref 0.7–4.0)
MCHC: 33.1 g/dL (ref 30.0–36.0)
MCV: 93.1 fl (ref 78.0–100.0)
Monocytes Absolute: 0.6 10*3/uL (ref 0.1–1.0)
Monocytes Relative: 2.4 % — ABNORMAL LOW (ref 3.0–12.0)
Neutro Abs: 8.1 10*3/uL — ABNORMAL HIGH (ref 1.4–7.7)
Neutrophils Relative %: 32.3 % — ABNORMAL LOW (ref 43.0–77.0)
Platelets: 294 10*3/uL (ref 150.0–400.0)
RBC: 3.78 Mil/uL — ABNORMAL LOW (ref 3.87–5.11)
RDW: 14.7 % — ABNORMAL HIGH (ref 11.5–14.6)
WBC: 25.1 10*3/uL (ref 4.5–10.5)

## 2010-12-13 MED ORDER — HYDROCORTISONE 1 % EX CREA: TOPICAL_CREAM | CUTANEOUS | Status: AC

## 2010-12-13 NOTE — Patient Instructions (Addendum)
We have sent the following medications to your pharmacy for you to pick up at your convenience: CBC, CMET, TtG We have sent the following medications to your pharmacy for you to pick up at your convenience: Hydrocortisone 1% cream. Apply to rash twice daily. We have given you a note for work until 12/25/10. Please follow up with Dr Juanda Chance in 4 weeks. CC: Dr Concepcion Elk

## 2010-12-13 NOTE — Progress Notes (Signed)
Alexandra Bell March 20, 1965 MRN [id number]   History of Present Illness:  This is a 45 year old African American female who is post hospitalization for acute illness, dehydration, nausea, vomiting and severe high-volume diarrhea. She was febrile and had asthmatic bronchitis. Her renal function has improved with hydration. Her studies show eosinophilic colitis. Biopsies on the flexible sigmoidoscopy showed numerous eosinophilic infiltrate consistent with acute colitis. She was treated with intravenous steroids and was discharged on prednisone 5 mg a day. She has been only marginally improved although the diarrhea has resolved. She has been able to eat and there has been no fever or abdominal pain. She has developed a pruritic rash over her extremities and torso. Her husband says that the patient stops breathing at night and snores. She has not been able to return to work.   Past Medical History  Diagnosis Date  . Hypertension   . Asthma   . Obesity, Class III, BMI 40-49.9 (morbid obesity)   . Acute renal failure   . GERD (gastroesophageal reflux disease)   . Inguinal hernia   . Periumbilical hernia   . Fatty liver 11/2010  . 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 Common bile duct stone   . Hx: UTI (urinary tract infection)    Past Surgical History  Procedure Date  . Abdominal hysterectomy   . Cholecystectomy 2001  . Ercp/sphincterotomy/stone extraction 2001  . Bronchoscopy, lymph node bx MAY 2011  . Flexible sigmoidoscopy 11/29/2010    Procedure: FLEXIBLE SIGMOIDOSCOPY;  Surgeon: Hart Carwin, MD;  Location: [facility] ENDOSCOPY;  Service: Endoscopy;  Laterality: N/A;    reports that she has quit smoking. Her smoking use included Cigarettes. She has a 45 pack-year smoking history. She has never used smokeless tobacco. She reports that she does not drink alcohol or use illicit drugs. family history includes Colon cancer in her father and Diabetes in her mother and sister. No Known Allergies       Review of Systems: denies dysphagia, odynophagia, positive for shortness of breath  The remainder of the 10 point ROS is negative except as outlined in H&P   Physical Exam: General appearance  Well developed, in no distress. overweight Eyes- non icteric. HEENT nontraumatic, normocephalic. Mouth no lesions, tongue papillated, no cheilosis. Neck supple without adenopathy, thyroid not enlarged, no carotid bruits, no JVD. Lungs Clear to auscultation bilaterally. Inspiratory and expiratory wheezes. Cor normal S1, normal S2, regular rhythm, no murmur,  quiet precordium. Abdomen:  Obese protuberant with but soft. Normoactive bowel sounds. No tenderness. Rectal: not done. Extremities no pedal edema. Skin  Papular clusters of lesions 1-2 mm in diameter over arms and chest as well as over the abdomen Neurological alert and oriented x 3. Psychological normal mood and affect.  Assessment and Plan:  Problem #1 Patient is status post acute illness resulting in acute renal failure and asthmatic bronchitis. She has recovered partially from the acute phase of the illness but is suspected to have eosinophilic colitis. The diarrhea has subsided on low-dose steroids. We will discontinue her prednisone at this point and see how she does without it. She will also discontinue her pantoprazole. We will get a CBC and metabolic panel as well as eosinophil count today. If the symptoms continue, I will have to do an upper endoscopy and small bowel biopsies to rule out eosinophils in her small bowel. She will use hydrocortisone 1% for the pruritic lesions.  number #2 possible obstructive sleep apnea. According to husband's description patient stops brief and at night, she  wakes up in the morning very tired. She may need to be evaluated for obstructive sleep apnea the sleep study   12/13/2010 Lina Sar

## 2010-12-14 ENCOUNTER — Telehealth: Payer: Self-pay | Admitting: Internal Medicine

## 2010-12-14 DIAGNOSIS — R799 Abnormal finding of blood chemistry, unspecified: Secondary | ICD-10-CM

## 2010-12-14 LAB — PATHOLOGIST SMEAR REVIEW

## 2010-12-14 LAB — TISSUE TRANSGLUTAMINASE, IGA: Tissue Transglutaminase Ab, IgA: 10.2 U/mL (ref <20)

## 2010-12-14 NOTE — Telephone Encounter (Signed)
Left voicemail for patient to call back. 

## 2010-12-14 NOTE — Telephone Encounter (Signed)
Left message for patient to call back  

## 2010-12-14 NOTE — Telephone Encounter (Signed)
I have spoken to the patient and she has scheduled an appointment for endoscopy tomorrow at 8 am. I have given her specific instructions regarding prep for the procedure. I have also asked that she have someone with her that is 13 or older for the entire procedure since she will be sedated. Patient verbalizes understanding that she should be on the 4th floor at 7:30 am for registration. She also verbalizes understanding of all other instructions.

## 2010-12-14 NOTE — Progress Notes (Signed)
Left voicemail for patient to call back. 

## 2010-12-14 NOTE — Telephone Encounter (Signed)
Message copied by Richardson Chiquito on Thu Dec 14, 2010  2:55 PM ------      Message from: Hart Carwin      Created: Wed Dec 13, 2010  8:33 PM       Pt notified of the results, as per our conversation, please. Schedule EGD with gastric and small bowl biopsies

## 2010-12-15 ENCOUNTER — Encounter: Payer: Self-pay | Admitting: Internal Medicine

## 2010-12-15 ENCOUNTER — Ambulatory Visit (AMBULATORY_SURGERY_CENTER): Payer: PRIVATE HEALTH INSURANCE | Admitting: Internal Medicine

## 2010-12-15 VITALS — BP 128/92 | HR 74 | Temp 97.7°F | Resp 15 | Ht 66.0 in | Wt 273.0 lb

## 2010-12-15 DIAGNOSIS — R7989 Other specified abnormal findings of blood chemistry: Secondary | ICD-10-CM

## 2010-12-15 DIAGNOSIS — K5281 Eosinophilic gastritis or gastroenteritis: Secondary | ICD-10-CM

## 2010-12-15 DIAGNOSIS — B3781 Candidal esophagitis: Secondary | ICD-10-CM

## 2010-12-15 DIAGNOSIS — K5282 Eosinophilic colitis: Secondary | ICD-10-CM

## 2010-12-15 DIAGNOSIS — K2940 Chronic atrophic gastritis without bleeding: Secondary | ICD-10-CM

## 2010-12-15 DIAGNOSIS — R197 Diarrhea, unspecified: Secondary | ICD-10-CM

## 2010-12-15 DIAGNOSIS — K5289 Other specified noninfective gastroenteritis and colitis: Secondary | ICD-10-CM

## 2010-12-15 HISTORY — DX: Eosinophilic colitis: K52.82

## 2010-12-15 MED ORDER — SODIUM CHLORIDE 0.9 % IV SOLN: 500.0000 mL | INTRAVENOUS | Status: DC

## 2010-12-15 MED ORDER — PREDNISONE (PAK) 10 MG PO TABS: ORAL_TABLET | ORAL | Status: AC

## 2010-12-15 MED ORDER — FLUCONAZOLE 100 MG PO TABS: 100.0000 mg | ORAL_TABLET | Freq: Every day | ORAL | Status: AC

## 2010-12-15 NOTE — Op Note (Signed)
[facility] [address] [place], [place]  [address]  ENDOSCOPY PROCEDURE REPORT  PATIENT:  Alexandra Bell, Alexandra Bell  MR#:  [id number] BIRTHDATE:  [birth year], 45 yrs. old  GENDER:  female  ENDOSCOPIST:  Hedwig Morton. Juanda Chance, MD Referred by:  Fleet Contras, M.D.  PROCEDURE DATE:  12/15/2010 PROCEDURE:  EGD with biopsy, 43239 ASA CLASS:  Class II INDICATIONS:  abdominal pain despite treatment, abnormal imaging eosinophylic colitis on recent sigmoidoscopy, improved on low dose Prednisone, WBC 25.000 with 50% eosinophils  MEDICATIONS:   These medications were titrated to patient response per physician's verbal order, Versed 5 mg, Fentanyl 50 mcg TOPICAL ANESTHETIC:  Cetacaine Spray  DESCRIPTION OF PROCEDURE:   After the risks benefits and alternatives of the procedure were thoroughly explained, informed consent was obtained.  The LB [id number]-[id number] endoscope was introduced through the mouth and advanced to the second portion of the duodenum, without limitations.  The instrument was slowly withdrawn as the mucosa was fully examined. <<PROCEDUREIMAGES>>  Moderate gastritis was found. diffuse fine nodularity and erythema of the gastric mucosa With standard forceps, a biopsy was obtained and sent to pathology (see image3, image4, image5, image6, and image7).  Esophagitis was found in the total esophagus. Candida esophagitis With standard forceps, a biopsy was obtained and sent to pathology (see image10, image9, image8, and image1).  The duodenal bulb was normal in appearance, as was the postbulbar duodenum. With standard forceps, a biopsy was obtained and sent to pathology. r/o eos (see image2). enteritis    Retroflexed views revealed no abnormalities.    The scope was then withdrawn from the patient and the procedure completed.  COMPLICATIONS:  None  ENDOSCOPIC IMPRESSION: 1) Moderate gastritis 2) Esophagitis in the total esophagus 3) Normal duodenum suspect eos.  gastroenteritis RECOMMENDATIONS: 1) Await biopsy results Diflucan 100 mg po qd x 10 days, Prednisone 20 mg po bid x 2 weeks, then start to taper as per prescription directions OV 2 weeks  REPEAT EXAM:  In 0 year(s) for.  ______________________________ Hedwig Morton. Juanda Chance, MD  CC:  n. eSIGNED:   Hedwig Morton. Brodie at 12/15/2010 08:38 AM  Sula Soda, [id number]

## 2010-12-15 NOTE — Progress Notes (Signed)
Patient did not experience any of the following events: a burn prior to discharge; a fall within the facility; wrong site/side/patient/procedure/implant event; or a hospital transfer or hospital admission upon discharge from the facility. (G8907) Patient did not have preoperative order for IV antibiotic SSI prophylaxis. (G8918)  

## 2010-12-15 NOTE — Progress Notes (Signed)
Pt has sores to bil arms.  States that they are all over her body.  She says that they came from medications in the hospital and constant itching and scratching.

## 2010-12-18 ENCOUNTER — Telehealth: Payer: Self-pay | Admitting: *Deleted

## 2010-12-18 NOTE — Telephone Encounter (Signed)
No answer, message left on answering machine. 

## 2010-12-19 ENCOUNTER — Telehealth: Payer: Self-pay | Admitting: *Deleted

## 2010-12-19 ENCOUNTER — Encounter: Payer: Self-pay | Admitting: Internal Medicine

## 2010-12-19 MED ORDER — FUROSEMIDE 20 MG PO TABS: ORAL_TABLET | ORAL | Status: DC

## 2010-12-19 NOTE — Telephone Encounter (Signed)
Message copied by Daphine Deutscher on Tue Dec 19, 2010  4:58 PM ------      Message from: Hart Carwin      Created: Tue Dec 19, 2010  4:51 PM       I have spoken to the pt. Please send her Lasix 20mg , #30, 1 po qd for fluid retention., 1 refill

## 2010-12-19 NOTE — Telephone Encounter (Signed)
Rx sent to pharmacy   

## 2010-12-22 ENCOUNTER — Telehealth: Payer: Self-pay | Admitting: Internal Medicine

## 2010-12-22 NOTE — Telephone Encounter (Signed)
Patient called to let Dr. Juanda Chance know her PCP called in an rx for Vitamin D for her. Told patient it will be okay for her to take this.

## 2011-01-10 ENCOUNTER — Ambulatory Visit: Payer: PRIVATE HEALTH INSURANCE | Admitting: Internal Medicine

## 2011-01-11 ENCOUNTER — Telehealth: Payer: Self-pay | Admitting: Internal Medicine

## 2011-01-11 NOTE — Telephone Encounter (Signed)
Dr Juanda Chance- Was patient to have hematology appointment???? Regina nor I can see where anyone was to set up hematology appt??

## 2011-01-11 NOTE — Telephone Encounter (Signed)
Now, this really concerns me, because I have spoken to Ms Alexandra Bell about arranging appointment with the Oncologist before Christmas, and she responded like she was going to arrange it, I am most surprised, that nothing has happened at this point. I need to see pt ASAP. and we need to arrange for her to see an oncologist, we need not use Alexandra Bell from now on since she does not  help [place].

## 2011-01-12 ENCOUNTER — Telehealth: Payer: Self-pay | Admitting: Oncology

## 2011-01-12 ENCOUNTER — Telehealth: Payer: Self-pay | Admitting: Internal Medicine

## 2011-01-12 NOTE — Telephone Encounter (Signed)
Called pt, left message to call us., regarding lab and MD visit on 01/17/11

## 2011-01-12 NOTE — Telephone Encounter (Signed)
Spoke with Dr. Cyndie Chime concerning patient. He will see  pt next week.He  review the CT scans ,the blood test and will get back to [place] concerning plan of treatment. Pt will continue on prednisone  30mg  /day till her appoinmtment with [facility].

## 2011-01-12 NOTE — Telephone Encounter (Signed)
Dr Juanda Chance has spoken to Dr Cyndie Chime (see 01/12/11 phone note). He has added patient to his schedule for 01-17-11 @ 11 am. I have spoken to Mrs.Mcgloin to advise her of the time and date of her appointment. I have explained that Dr Juanda Chance did contact the cancer center originally for an appiontment and was unaware that it had never been scheduled. I apologized to the patient for taking so long to get an appointment for her.

## 2011-01-16 ENCOUNTER — Encounter: Payer: Self-pay | Admitting: Oncology

## 2011-01-16 ENCOUNTER — Other Ambulatory Visit: Payer: Self-pay | Admitting: Oncology

## 2011-01-16 DIAGNOSIS — K5282 Eosinophilic colitis: Secondary | ICD-10-CM

## 2011-01-16 DIAGNOSIS — D721 Eosinophilia, unspecified: Secondary | ICD-10-CM

## 2011-01-16 HISTORY — DX: Eosinophilia, unspecified: D72.10

## 2011-01-17 ENCOUNTER — Ambulatory Visit: Payer: PRIVATE HEALTH INSURANCE

## 2011-01-17 ENCOUNTER — Encounter: Payer: Self-pay | Admitting: Oncology

## 2011-01-17 ENCOUNTER — Ambulatory Visit (HOSPITAL_BASED_OUTPATIENT_CLINIC_OR_DEPARTMENT_OTHER): Payer: PRIVATE HEALTH INSURANCE | Admitting: Oncology

## 2011-01-17 ENCOUNTER — Telehealth: Payer: Self-pay | Admitting: Oncology

## 2011-01-17 ENCOUNTER — Other Ambulatory Visit (HOSPITAL_BASED_OUTPATIENT_CLINIC_OR_DEPARTMENT_OTHER): Payer: PRIVATE HEALTH INSURANCE | Admitting: Lab

## 2011-01-17 DIAGNOSIS — D72829 Elevated white blood cell count, unspecified: Secondary | ICD-10-CM

## 2011-01-17 DIAGNOSIS — K5282 Eosinophilic colitis: Secondary | ICD-10-CM

## 2011-01-17 DIAGNOSIS — D721 Eosinophilia, unspecified: Secondary | ICD-10-CM

## 2011-01-17 DIAGNOSIS — K5281 Eosinophilic gastritis or gastroenteritis: Secondary | ICD-10-CM

## 2011-01-17 LAB — CBC & DIFF AND RETIC
BASO%: 0.5 % (ref 0.0–2.0)
Basophils Absolute: 0.1 10*3/uL (ref 0.0–0.1)
EOS%: 0.8 % (ref 0.0–7.0)
Eosinophils Absolute: 0.1 10*3/uL (ref 0.0–0.5)
HCT: 43.5 % (ref 34.8–46.6)
HGB: 14.3 g/dL (ref 11.6–15.9)
Immature Retic Fract: 9.7 % (ref 1.60–10.00)
LYMPH%: 31.5 % (ref 14.0–49.7)
MCH: 30.3 pg (ref 25.1–34.0)
MCHC: 32.9 g/dL (ref 31.5–36.0)
MCV: 92.2 fL (ref 79.5–101.0)
MONO#: 0.6 10*3/uL (ref 0.1–0.9)
MONO%: 5 % (ref 0.0–14.0)
NEUT#: 7.8 10*3/uL — ABNORMAL HIGH (ref 1.5–6.5)
NEUT%: 62.2 % (ref 38.4–76.8)
Platelets: 317 10*3/uL (ref 145–400)
RBC: 4.72 10*6/uL (ref 3.70–5.45)
RDW: 15.5 % — ABNORMAL HIGH (ref 11.2–14.5)
Retic %: 2.26 % — ABNORMAL HIGH (ref 0.70–2.10)
Retic Ct Abs: 106.67 10*3/uL — ABNORMAL HIGH (ref 33.70–90.70)
WBC: 12.5 10*3/uL — ABNORMAL HIGH (ref 3.9–10.3)
lymph#: 3.9 10*3/uL — ABNORMAL HIGH (ref 0.9–3.3)

## 2011-01-17 LAB — MORPHOLOGY
PLT EST: ADEQUATE
RBC Comments: NORMAL

## 2011-01-17 LAB — HOLD TUBE, BLOOD BANK

## 2011-01-17 LAB — CHCC SMEAR

## 2011-01-17 NOTE — Progress Notes (Addendum)
New Patient Hematology Evaluation   Alexandra Bell [id number] 12-19-1965 46 y.o. 01/17/2011  CC: Dr. Milford Cage         Dr. Lina Sar  Reason for referral: Recent history of the eosinophilic gastritis   HPI:  46 year old woman in overall excellent health except for treated hypertension and adult onset asthma. In early November 2012 she began to develop unexplained itching of the palms of her hands and soles of her feet. I'm precisely November 14 she had sudden onset of diarrhea and abdominal pain with no fever. At no time did she see any blood in her bowel movements. She saw her primary care physician. She was started on Cipro. She got worse over the weekend with intractable vomiting, persistent diarrhea, hot and cold flashes, myalgias, no arthralgias, positive headache. No skin rash. She was admitted to [facility]. She was hypotensive. She was dehydrated with BUN 34 creatinine 2.9. She was Hemoccult concentrated with initial hemoglobin 17 hematocrit 48. White count elevated at 20,000 with 80% neutrophils 9% lymphocytes 6 monocytes 5 eosinophils and platelet count 286,000 done 11/27/10. She was hydrated and started on antibiotics. Stools positive for lactoferrin but negative for C. difficile. Total white count rose to a peak of 25,000 on December 5. White count differential showed increasing numbers of eosinophils as high as 51% on the December 5 lab. She was seen in consultation by Dr. Lina Sar gastroenterology. She had upper and lower endoscopy. "Moderate" gastritis seen in the stomach. Question of candida infection. Pathology on random biopsies of the gastric mucosa showed an inflammatory infiltrate in the lamina propria with increased eosinophils. No fungi. Negative for Helicobacter. Duodenal biopsies also showed some chronic inflammation in the lamina propria including increased eosinophils. No granulomas. Initial flexible sigmoidoscopy done in 11/21 showed mild nonspecific colitis in  the left colon with tiny erosions but no ulcers no hemorrhage. Pathology showed infiltration of the lamina propria with an inflammatory infiltrate consisting of eosinophils, plasma cells, and histiocytes. While in the hospital she had asthma attack and was treated with steroids and bronchodilators with good results. She improve clinically and was discharged. Bowel movements are now normal again. She is back to work. CT scan of the abdomen was done in the hospital and I have personally reviewed the images. This was an unenhanced study due to initial dehydration. Report states "increased number of normal size 2 enlarged lymph nodes in the retroperitoneal and mesenteric region. The mesenteric lymph nodes measured 1.9 x 1.3 cm. Retroperitoneal lymph nodes at the level of the renal vein measuring up to 2.1 x 1.3 cm. Although it is possible these are reactive in origin, tumors such as lymphoma needs to be considered." I am personally underwhelmed with these findings. There was no other pathology in the abdomen. No bulky adenopathy no splenomegaly. Fatty changes in the liver.  No parasites were seen in any of the biopsy specimens. She has no known exposures. No travel. No pets. No history of medication or environmental allergies. She works at [employer] but is not exposed to any organic solvents or heavy strength cleaning fluids. She does not eat any raw meat. She has no pets. She has not changed her laundry detergent. There is no history of any blood disorder in her family.   PMH: Past Medical History  Diagnosis Date  . Hypertension   . Asthma   . Obesity, Class III, BMI 40-49.9 (morbid obesity)   . Acute renal failure   . GERD (gastroesophageal reflux disease)   .  Inguinal hernia   . Periumbilical hernia   . Fatty liver 11/2010  . 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 Common bile duct stone   . Hx: UTI (urinary tract infection)   . Eosinophilia 01/16/2011  . 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 Abdominal hysterectomy   . Cholecystectomy 2001  . Ercp/sphincterotomy/stone extraction 2001  . Bronchoscopy, lymph node bx MAY 2011  . Flexible sigmoidoscopy 11/29/2010    Procedure: FLEXIBLE SIGMOIDOSCOPY;  Surgeon: Hart Carwin, MD;  Location: [facility] ENDOSCOPY;  Service: Endoscopy;  Laterality: N/A;    Allergies: No Known Allergies  Medications: Medications Prior to Admission  Medication Sig Dispense Refill  . acetaminophen (TYLENOL) 500 MG tablet Take 1,500 mg by mouth every 6 (six) hours as needed. For pain      . albuterol (PROVENTIL HFA;VENTOLIN HFA) 108 (90 BASE) MCG/ACT inhaler Inhale 2 puffs into the lungs every 6 (six) hours as needed. For asthma  1 Inhaler  3  . aspirin EC 81 MG tablet Take 81 mg by mouth daily as needed. For pain       . diphenoxylate-atropine (LOMOTIL) 2.5-0.025 MG per tablet Take 1 tablet by mouth 4 (four) times daily as needed. For diarrhea      . fluticasone (FLOVENT HFA) 44 MCG/ACT inhaler Inhale 2 puffs into the lungs 2 (two) times daily.  1 Inhaler  3  . furosemide (LASIX) 20 MG tablet Take one po daily for fluid retention  30 tablet  1  . traMADol (ULTRAM) 50 MG tablet 2 (two) times daily as needed.       . hydrocortisone cream 1 % Apply to affected area 2 times daily  30 g  1  . lisinopril-hydrochlorothiazide (PRINZIDE,ZESTORETIC) 20-25 MG per tablet        No current facility-administered medications on file as of 01/17/2011.    Social History:   reports that she has quit smoking. Her smoking use included Cigarettes. She has a 45 pack-year smoking history. She has never used smokeless tobacco. She reports that she does not drink alcohol or use illicit drugs. Son 88 daughter 59 both healthy.  Family History: Family History  Problem Relation Age of Onset  . Diabetes Mother   . Diabetes Sister   . Colon cancer Father     ?  Parents are still alive and well. She has 2 sisters 4 brothers who  are healthy.  Review of Systems: Constitutional ROS: Currently back to her full activities. No anorexia no weight loss no fevers no sweats Cardiovascular ROS: No chest pain or palpitations Respiratory ROS: No dyspnea no cough Neurological ROS: No headache no change in vision Dermatological ROS: She has developed a dermatitis on both of her arms where she has been scratching ENT ROS: No sore throat Gastrointestinal ROS: Bowel movements now normal. No hematochezia or melena. Abdominal cramps have resolved. Genito-Urinary ROS: No dysuria or frequency. Hematological and Lymphatic ROS: No swollen glands.  Musculoskeletal ROS: Currently no myalgias or arthralgias no bone pain Remaining ROS negative.  Physical Exam: Blood pressure 136/76, pulse 86, temperature 97.8 F (36.6 C), temperature source Oral, height 5\' 5"  (1.651 m), weight 265 lb 6.4 oz (120.385 kg). Wt Readings from Last 3 Encounters:  01/17/11 265 lb 6.4 oz (120.385 kg)  12/15/10 273 lb (123.832 kg)  12/13/10 273 lb (123.832 kg)    General appearance: Pleasant overweight African American woman Head: Normal Neck: Full range of motion Lymph nodes: No cervical supraclavicular  or axillary adenopathy  Breasts: Not examined Lungs: Clear to auscultation resonant to percussion Heart: Regular cardiac rhythm 2/6 systolic murmur left sternal border  Abdominal: Soft obese nontender previous cholecystectomy scar no mass no organomegaly GU: Not examined. Previous partial hysterectomy for fibroid uterus per her history Extremities: No edema no calf tenderness  Neurologic: Pupils equal reactive to light cranial nerves grossly intact motor strength 5 over 5 Skin: punctate hypopigmented spots on both arms and in the the area of her chest which she has been scratching yourself. No ecchymoses.    Lab Results: Lab Results  Component Value Date   WBC 12.5* 01/17/2011   HGB 14.3 01/17/2011   HCT 43.5 01/17/2011   MCV 92.2 01/17/2011   PLT 317  01/17/2011     Chemistry      Component Value Date/Time   NA 142 12/13/2010 1004   K 4.8 12/13/2010 1004   CL 105 12/13/2010 1004   CO2 27 12/13/2010 1004   BUN 14 12/13/2010 1004   CREATININE 0.9 12/13/2010 1004      Component Value Date/Time   CALCIUM 9.2 12/13/2010 1004   ALKPHOS 116 12/13/2010 1004   AST 21 12/13/2010 1004   ALT 20 12/13/2010 1004   BILITOT 0.3 12/13/2010 1004       Pathology: Radiological Studies: See discussion above  Review of the peripheral blood film: Normochromic normocytic red cells. White count differential now normal with normal neutrophils and lymphocytes. No increased eosinophils at this time.   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 Leukocytosis with peripheral blood eosinophilia now resolving. Total white count today 12,500 with less than 1% eosinophils. No immature cells on review of the blood film. Normalization of her hemoglobin compared with hospital value. I am not impressed with CT findings of some borderline lymph nodes in the retroperitoneum. I Do not find any evidence that she has an underlying hematologic disorder. I do not plan any further evaluation at this time.      Levert Feinstein, MD 01/17/2011, 1:51 PM

## 2011-01-17 NOTE — Telephone Encounter (Signed)
Per 01/17/11 pof pt to ret prn,  Pt aware!   aom

## 2011-01-22 LAB — URIC ACID: Uric Acid, Serum: 8.4 mg/dL — ABNORMAL HIGH (ref 2.4–7.0)

## 2011-01-22 LAB — COMPREHENSIVE METABOLIC PANEL
ALT: 20 U/L (ref 0–35)
AST: 19 U/L (ref 0–37)
Albumin: 3.9 g/dL (ref 3.5–5.2)
Alkaline Phosphatase: 56 U/L (ref 39–117)
BUN: 14 mg/dL (ref 6–23)
CO2: 27 mEq/L (ref 19–32)
Calcium: 9.7 mg/dL (ref 8.4–10.5)
Chloride: 102 mEq/L (ref 96–112)
Creatinine, Ser: 0.71 mg/dL (ref 0.50–1.10)
Glucose, Bld: 107 mg/dL — ABNORMAL HIGH (ref 70–99)
Potassium: 3.8 mEq/L (ref 3.5–5.3)
Sodium: 139 mEq/L (ref 135–145)
Total Bilirubin: 0.3 mg/dL (ref 0.3–1.2)
Total Protein: 6.4 g/dL (ref 6.0–8.3)

## 2011-01-22 LAB — IMMUNOFIXATION ELECTROPHORESIS
IgA: 237 mg/dL (ref 69–380)
IgG (Immunoglobin G), Serum: 962 mg/dL (ref 690–1700)
IgM, Serum: 100 mg/dL (ref 52–322)
Total Protein, Serum Electrophoresis: 6.4 g/dL (ref 6.0–8.3)

## 2011-01-22 LAB — SEDIMENTATION RATE: Sed Rate: 7 mm/hr (ref 0–22)

## 2011-01-22 LAB — LACTATE DEHYDROGENASE: LDH: 252 U/L — ABNORMAL HIGH (ref 94–250)

## 2011-01-22 LAB — IGE: IgE (Immunoglobulin E), Serum: 13377.5 IU/mL — ABNORMAL HIGH (ref 0.0–180.0)

## 2011-03-04 ENCOUNTER — Other Ambulatory Visit: Payer: Self-pay | Admitting: Internal Medicine

## 2011-03-04 DIAGNOSIS — Z87448 Personal history of other diseases of urinary system: Secondary | ICD-10-CM

## 2011-03-04 DIAGNOSIS — K5282 Eosinophilic colitis: Secondary | ICD-10-CM

## 2011-03-05 NOTE — Telephone Encounter (Signed)
From: Hart Carwin, MD    Sent: 03/05/2011   9:27 AM      To: Vernia Buff, CMA Subject: Lasix                                          OK to refill Lasix, repeat LFT's, CBC in 6 weeks ----- Message -----    From: Vernia Buff, CMA    Sent: 03/05/2011   8:13 AM      To: Hart Carwin, MD  Dr Juanda Chance- Patient requests additional refills on lasix (as originally given in 12/2010). Do you want me to continue refills? Does she need to have follow up labs in a couple months?

## 2011-04-26 ENCOUNTER — Other Ambulatory Visit (INDEPENDENT_AMBULATORY_CARE_PROVIDER_SITE_OTHER): Payer: PRIVATE HEALTH INSURANCE

## 2011-04-26 DIAGNOSIS — Z87448 Personal history of other diseases of urinary system: Secondary | ICD-10-CM

## 2011-04-26 DIAGNOSIS — K5282 Eosinophilic colitis: Secondary | ICD-10-CM

## 2011-04-26 LAB — CBC WITH DIFFERENTIAL/PLATELET
Basophils Absolute: 0 10*3/uL (ref 0.0–0.1)
Basophils Relative: 0.3 % (ref 0.0–3.0)
Eosinophils Absolute: 0.1 10*3/uL (ref 0.0–0.7)
Eosinophils Relative: 0.7 % (ref 0.0–5.0)
HCT: 44.3 % (ref 36.0–46.0)
Hemoglobin: 14.5 g/dL (ref 12.0–15.0)
Lymphocytes Relative: 27.6 % (ref 12.0–46.0)
Lymphs Abs: 2.6 10*3/uL (ref 0.7–4.0)
MCHC: 32.7 g/dL (ref 30.0–36.0)
MCV: 91.2 fl (ref 78.0–100.0)
Monocytes Absolute: 0.6 10*3/uL (ref 0.1–1.0)
Monocytes Relative: 6.4 % (ref 3.0–12.0)
Neutro Abs: 6 10*3/uL (ref 1.4–7.7)
Neutrophils Relative %: 65 % (ref 43.0–77.0)
Platelets: 253 10*3/uL (ref 150.0–400.0)
RBC: 4.85 Mil/uL (ref 3.87–5.11)
RDW: 14.9 % — ABNORMAL HIGH (ref 11.5–14.6)
WBC: 9.3 10*3/uL (ref 4.5–10.5)

## 2011-04-26 LAB — HEPATIC FUNCTION PANEL
ALT: 15 U/L (ref 0–35)
AST: 19 U/L (ref 0–37)
Albumin: 4 g/dL (ref 3.5–5.2)
Alkaline Phosphatase: 50 U/L (ref 39–117)
Bilirubin, Direct: 0.1 mg/dL (ref 0.0–0.3)
Total Bilirubin: 0.6 mg/dL (ref 0.3–1.2)
Total Protein: 7.1 g/dL (ref 6.0–8.3)

## 2013-04-16 ENCOUNTER — Other Ambulatory Visit (HOSPITAL_COMMUNITY): Payer: Self-pay | Admitting: Internal Medicine

## 2013-04-16 DIAGNOSIS — R609 Edema, unspecified: Secondary | ICD-10-CM

## 2013-04-23 ENCOUNTER — Ambulatory Visit (HOSPITAL_COMMUNITY): Payer: BC Managed Care – PPO | Attending: Internal Medicine

## 2014-04-02 ENCOUNTER — Encounter (HOSPITAL_COMMUNITY): Payer: Self-pay | Admitting: Emergency Medicine

## 2014-04-02 ENCOUNTER — Emergency Department (HOSPITAL_COMMUNITY)
Admission: EM | Admit: 2014-04-02 | Discharge: 2014-04-02 | Disposition: A | Discharge status: Home / Self Care | Payer: 59 | Attending: Emergency Medicine | Admitting: Emergency Medicine

## 2014-04-02 ENCOUNTER — Emergency Department (HOSPITAL_COMMUNITY): Payer: 59

## 2014-04-02 DIAGNOSIS — Z7952 Long term (current) use of systemic steroids: Secondary | ICD-10-CM | POA: Insufficient documentation

## 2014-04-02 DIAGNOSIS — S8991XA Unspecified injury of right lower leg, initial encounter: Secondary | ICD-10-CM | POA: Diagnosis present

## 2014-04-02 DIAGNOSIS — J45909 Unspecified asthma, uncomplicated: Secondary | ICD-10-CM | POA: Insufficient documentation

## 2014-04-02 DIAGNOSIS — Z7951 Long term (current) use of inhaled steroids: Secondary | ICD-10-CM | POA: Insufficient documentation

## 2014-04-02 DIAGNOSIS — Y9389 Activity, other specified: Secondary | ICD-10-CM | POA: Diagnosis not present

## 2014-04-02 DIAGNOSIS — Z87440 Personal history of urinary (tract) infections: Secondary | ICD-10-CM | POA: Diagnosis not present

## 2014-04-02 DIAGNOSIS — S3992XA Unspecified injury of lower back, initial encounter: Secondary | ICD-10-CM | POA: Insufficient documentation

## 2014-04-02 DIAGNOSIS — W1839XA Other fall on same level, initial encounter: Secondary | ICD-10-CM | POA: Insufficient documentation

## 2014-04-02 DIAGNOSIS — I10 Essential (primary) hypertension: Secondary | ICD-10-CM | POA: Diagnosis not present

## 2014-04-02 DIAGNOSIS — Z87448 Personal history of other diseases of urinary system: Secondary | ICD-10-CM | POA: Insufficient documentation

## 2014-04-02 DIAGNOSIS — Y998 Other external cause status: Secondary | ICD-10-CM | POA: Diagnosis not present

## 2014-04-02 DIAGNOSIS — Z7982 Long term (current) use of aspirin: Secondary | ICD-10-CM | POA: Diagnosis not present

## 2014-04-02 DIAGNOSIS — Z8719 Personal history of other diseases of the digestive system: Secondary | ICD-10-CM | POA: Insufficient documentation

## 2014-04-02 DIAGNOSIS — Y9289 Other specified places as the place of occurrence of the external cause: Secondary | ICD-10-CM | POA: Insufficient documentation

## 2014-04-02 DIAGNOSIS — Z79899 Other long term (current) drug therapy: Secondary | ICD-10-CM | POA: Insufficient documentation

## 2014-04-02 DIAGNOSIS — Z87891 Personal history of nicotine dependence: Secondary | ICD-10-CM | POA: Diagnosis not present

## 2014-04-02 DIAGNOSIS — M25461 Effusion, right knee: Secondary | ICD-10-CM

## 2014-04-02 MED ORDER — OXYCODONE-ACETAMINOPHEN 5-325 MG PO TABS: 1.0000 | ORAL_TABLET | Freq: Four times a day (QID) | ORAL | Status: DC | PRN

## 2014-04-02 MED ORDER — OXYCODONE-ACETAMINOPHEN 5-325 MG PO TABS
1.0000 | ORAL_TABLET | Freq: Once | ORAL | Status: AC
  Administered 2014-04-02: 1 via ORAL
  Filled 2014-04-02: qty 1

## 2014-04-02 NOTE — ED Provider Notes (Addendum)
CSN: [id number]     Arrival date & time 04/02/14  0701 History   First MD Initiated Contact with Patient 04/02/14 [phone]     Chief Complaint  Patient presents with  . Fall     (Consider location/radiation/quality/duration/timing/severity/associated sxs/prior Treatment) HPI Comments: Patient states for some time now her right knee has been popping and hurting. She recently saw her primary physician and was given a referral to an orthopedist who she has not seen yet.  Patient is a 49 y.o. female presenting with fall. The history is provided by the patient.  Fall This is a new (Patient was trying to get in her car today to go to work when her right knee gave out causing her to fall to the pavement) problem. The current episode started less than 1 hour ago. The problem occurs constantly. The problem has not changed since onset.Associated symptoms comments: Right knee and low back tenderness. No LOC. No neck tenderness. The symptoms are aggravated by walking and standing. The symptoms are relieved by rest. She has tried rest for the symptoms. The treatment provided no relief.    Past Medical History  Diagnosis Date  . Hypertension   . Asthma   . Obesity, Class III, BMI 40-49.9 (morbid obesity)   . Acute renal failure   . GERD (gastroesophageal reflux disease)   . Inguinal hernia   . Periumbilical hernia   . Fatty liver 11/2010  . Eosinophilic enteritis 43/3295  . Common bile duct stone   . Hx: UTI (urinary tract infection)   . Eosinophilia 01/16/2011  . Colitis, eosinophilic 18/08/4164   Past Surgical History  Procedure Laterality Date  . Abdominal hysterectomy    . Cholecystectomy  2001  . Ercp/sphincterotomy/stone extraction  2001  . Bronchoscopy, lymph node bx  MAY 2011  . Flexible sigmoidoscopy  11/29/2010    Procedure: FLEXIBLE SIGMOIDOSCOPY;  Surgeon: Lafayette Dragon, MD;  Location[facility] ENDOSCOPY;  Service: Endoscopy;  Laterality: N/A;   Family History  Problem Relation Age of  Onset  . Diabetes Mother   . Diabetes Sister   . Colon cancer Father     ?   History  Substance Use Topics  . Smoking status: Former Smoker -- 1.50 packs/day for 30 years    Types: Cigarettes  . Smokeless tobacco: Never Used  . Alcohol Use: No   OB History    No data available     Review of Systems  All other systems reviewed and are negative.     Allergies  Review of patient's allergies indicates no known allergies.  Home Medications   Prior to Admission medications   Medication Sig Start Date End Date Taking? Authorizing Provider  acetaminophen (TYLENOL) 500 MG tablet Take 1,500 mg by mouth every 6 (six) hours as needed. For pain    Historical Provider, MD  albuterol (PROVENTIL HFA;VENTOLIN HFA) 108 (90 BASE) MCG/ACT inhaler Inhale 2 puffs into the lungs every 6 (six) hours as needed. For asthma 12/01/10   Ripudeep Krystal Eaton, MD  aspirin EC 81 MG tablet Take 81 mg by mouth daily as needed. For pain     Historical Provider, MD  diphenoxylate-atropine (LOMOTIL) 2.5-0.025 MG per tablet Take 1 tablet by mouth 4 (four) times daily as needed. For diarrhea    Historical Provider, MD  ergocalciferol (VITAMIN D2) 50000 UNITS capsule Take 50,000 Units by mouth once a week.    Historical Provider, MD  fluticasone (FLOVENT HFA) 44 MCG/ACT inhaler Inhale 2 puffs into the  lungs 2 (two) times daily. 12/01/10 12/01/11  Ripudeep Krystal Eaton, MD  furosemide (LASIX) 20 MG tablet TAKE 1 TABLET BY MOUTH EVERY DAY FOR FLUID RETENTION 03/04/11   Lafayette Dragon, MD  lisinopril (PRINIVIL,ZESTRIL) 5 MG tablet Take 5 mg by mouth daily.    Historical Provider, MD  lisinopril-hydrochlorothiazide (PRINZIDE,ZESTORETIC) 20-25 MG per tablet  11/09/10   Historical Provider, MD  predniSONE (DELTASONE) 10 MG tablet Take 10 mg by mouth daily. 2 tabs po daily    Historical Provider, MD  traMADol (ULTRAM) 50 MG tablet 2 (two) times daily as needed.  09/12/10   Historical Provider, MD   BP 140/101 mmHg  Temp(Src) 97.9 F  (36.6 C) (Oral)  Resp 17  Ht 5\' 6"  (1.676 m)  Wt 280 lb (127.007 kg)  BMI 45.21 kg/m2  SpO2 98% Physical Exam  Constitutional: She is oriented to person, place, and time. She appears well-developed and well-nourished. No distress.  HENT:  Head: Normocephalic and atraumatic.  Eyes: EOM are normal. Pupils are equal, round, and reactive to light.  Neck: No spinous process tenderness and no muscular tenderness present.  Cardiovascular: Normal rate and intact distal pulses.   Pulmonary/Chest: Effort normal.  Musculoskeletal: Normal range of motion.       Right knee: She exhibits swelling. She exhibits normal range of motion and no deformity. Tenderness found. Medial joint line and lateral joint line tenderness noted.       Thoracic back: Normal.       Lumbar back: She exhibits tenderness and bony tenderness.       Back:  No edema  Neurological: She is alert and oriented to person, place, and time. No cranial nerve deficit.  Skin: Skin is warm and dry. No rash noted.  Psychiatric: She has a normal mood and affect. Her behavior is normal.  Nursing note and vitals reviewed.   ED Course  Procedures (including critical care time) Labs Review Labs Reviewed - No data to display  Imaging Review Dg Lumbar Spine Complete  04/02/2014   CLINICAL DATA:  Recent fall with low back pain, initial encounter  EXAM: LUMBAR SPINE - COMPLETE 4+ VIEW  COMPARISON:  None.  FINDINGS: Five lumbar type vertebral bodies are well visualized. Vertebral body height is well maintained. Pars defects are noted at L5 bilaterally. Mild anterolisthesis of a degenerative nature is noted of L4 on L5. No gross soft tissue abnormality is seen.  IMPRESSION: No acute abnormality is identified. Degenerative changes in the lower lumbar spine are seen.   Electronically Signed   By: Inez Catalina M.D.   On: 04/02/2014 08:07   Dg Knee Complete 4 Views Right  04/02/2014   CLINICAL DATA:  Right knee pain secondary to a fall this  morning.  EXAM: RIGHT KNEE - COMPLETE 4+ VIEW  COMPARISON:  MRI dated 10/08/2013  FINDINGS: There is small knee joint effusion. There is no fracture or dislocation. Minimal marginal osteophytes in the medial compartment with slight medial joint space narrowing.  IMPRESSION: No acute osseous abnormality.  Small joint effusion.   Electronically Signed   By: Lorriane Shire M.D.   On: 04/02/2014 08:07     EKG Interpretation None      MDM   Final diagnoses:  Knee effusion, right    Patient presents today after a mechanical fall complaining of right knee and lumbar tenderness. She denies any LOC or head injury. She has been having recurrent problems with her right knee and it will just give  out on her which is what happened today when she was trying to get into her car. She now has severe right knee pain and is unable to bear weight due to pain. She recently saw her PCP and was given referral to an orthopedist which she has not seen yet.  On exam she has generalized right knee pain with significant pain with weightbearing. She is able to range the knee with low suspicion for dislocation. Plain films of the knee and back pending. No C-spine or T-spine tenderness.    8:29 AM Imaging neg except for right knee effusion.  Will send home to follow up with ortho as planned.  Blanchie Dessert, MD 04/02/14 [id number]  Blanchie Dessert, MD 04/02/14 [phone]

## 2014-04-02 NOTE — Discharge Instructions (Signed)

## 2014-04-02 NOTE — ED Notes (Signed)
Pt fell this morning. States her knee gives out on her-- as she was getting out of car her right knee gave out on her and she fell down.

## 2015-06-17 ENCOUNTER — Encounter (HOSPITAL_COMMUNITY): Payer: Self-pay

## 2015-06-17 ENCOUNTER — Emergency Department (HOSPITAL_COMMUNITY)
Admission: EM | Admit: 2015-06-17 | Discharge: 2015-06-17 | Disposition: A | Discharge status: Home / Self Care | Payer: BLUE CROSS/BLUE SHIELD | Attending: Emergency Medicine | Admitting: Emergency Medicine

## 2015-06-17 DIAGNOSIS — J45909 Unspecified asthma, uncomplicated: Secondary | ICD-10-CM | POA: Insufficient documentation

## 2015-06-17 DIAGNOSIS — E669 Obesity, unspecified: Secondary | ICD-10-CM | POA: Insufficient documentation

## 2015-06-17 DIAGNOSIS — Z87448 Personal history of other diseases of urinary system: Secondary | ICD-10-CM | POA: Diagnosis not present

## 2015-06-17 DIAGNOSIS — Z8719 Personal history of other diseases of the digestive system: Secondary | ICD-10-CM | POA: Insufficient documentation

## 2015-06-17 DIAGNOSIS — F17210 Nicotine dependence, cigarettes, uncomplicated: Secondary | ICD-10-CM | POA: Diagnosis not present

## 2015-06-17 DIAGNOSIS — Z79899 Other long term (current) drug therapy: Secondary | ICD-10-CM | POA: Insufficient documentation

## 2015-06-17 DIAGNOSIS — M1990 Unspecified osteoarthritis, unspecified site: Secondary | ICD-10-CM | POA: Diagnosis not present

## 2015-06-17 DIAGNOSIS — Z7982 Long term (current) use of aspirin: Secondary | ICD-10-CM | POA: Insufficient documentation

## 2015-06-17 DIAGNOSIS — R040 Epistaxis: Secondary | ICD-10-CM | POA: Diagnosis present

## 2015-06-17 DIAGNOSIS — Z87440 Personal history of urinary (tract) infections: Secondary | ICD-10-CM | POA: Insufficient documentation

## 2015-06-17 DIAGNOSIS — I10 Essential (primary) hypertension: Secondary | ICD-10-CM | POA: Diagnosis not present

## 2015-06-17 HISTORY — DX: Unspecified osteoarthritis, unspecified site: M19.90

## 2015-06-17 LAB — CBC WITH DIFFERENTIAL/PLATELET
Basophils Absolute: 0 10*3/uL (ref 0.0–0.1)
Basophils Relative: 0 %
Eosinophils Absolute: 0.1 10*3/uL (ref 0.0–0.7)
Eosinophils Relative: 1 %
HCT: 43.9 % (ref 36.0–46.0)
Hemoglobin: 14.2 g/dL (ref 12.0–15.0)
Lymphocytes Relative: 24 %
Lymphs Abs: 2.8 10*3/uL (ref 0.7–4.0)
MCH: 29 pg (ref 26.0–34.0)
MCHC: 32.3 g/dL (ref 30.0–36.0)
MCV: 89.6 fL (ref 78.0–100.0)
Monocytes Absolute: 0.5 10*3/uL (ref 0.1–1.0)
Monocytes Relative: 5 %
Neutro Abs: 8.3 10*3/uL — ABNORMAL HIGH (ref 1.7–7.7)
Neutrophils Relative %: 70 %
Platelets: 274 10*3/uL (ref 150–400)
RBC: 4.9 MIL/uL (ref 3.87–5.11)
RDW: 13.8 % (ref 11.5–15.5)
WBC: 11.8 10*3/uL — ABNORMAL HIGH (ref 4.0–10.5)

## 2015-06-17 LAB — BASIC METABOLIC PANEL
Anion gap: 8 (ref 5–15)
BUN: 16 mg/dL (ref 6–20)
CO2: 25 mmol/L (ref 22–32)
Calcium: 10.1 mg/dL (ref 8.9–10.3)
Chloride: 105 mmol/L (ref 101–111)
Creatinine, Ser: 0.69 mg/dL (ref 0.44–1.00)
GFR calc Af Amer: >60 mL/min (ref >60)
GFR calc non Af Amer: >60 mL/min (ref >60)
Glucose, Bld: 95 mg/dL (ref 65–99)
Potassium: 4.1 mmol/L (ref 3.5–5.1)
Sodium: 138 mmol/L (ref 135–145)

## 2015-06-17 LAB — TYPE AND SCREEN
ABO/RH(D): A POS
Antibody Screen: NEGATIVE

## 2015-06-17 LAB — ABO/RH: ABO/RH(D): A POS

## 2015-06-17 MED ORDER — OXYMETAZOLINE HCL 0.05 % NA SOLN
2.0000 | Freq: Once | NASAL | Status: AC
  Administered 2015-06-17: 2 via NASAL

## 2015-06-17 MED ORDER — OXYMETAZOLINE HCL 0.05 % NA SOLN: 2.0000 | Freq: Two times a day (BID) | NASAL | Status: DC

## 2015-06-17 MED ORDER — LIDOCAINE-EPINEPHRINE (PF) 2 %-1:200000 IJ SOLN
20.0000 mL | Freq: Once | INTRAMUSCULAR | Status: DC
  Filled 2015-06-17: qty 20

## 2015-06-17 NOTE — ED Provider Notes (Signed)
CSN: [id number]     Arrival date & time 06/17/15  0622 History   First MD Initiated Contact with Patient 06/17/15 [phone]     Chief Complaint  Patient presents with  . Epistaxis     (Consider location/radiation/quality/duration/timing/severity/associated sxs/prior Treatment) HPI   Alexandra Bell is a 50 y.o. female, with a history of Hypertension, presenting to the ED with A nosebleed that began around 1 AM this morning. Patient states she was at rest when the bleeding started.  She has gotten the bleeding to stop "about 4 or 5 times" using direct pressure, but the bleeding always recurs. She has not had this issue before. She has taken her lisinopril this morning already. Patient denies trauma, anticoagulation, headache, dizziness, chest pain, shortness of breath, or any other complaints.      Past Medical History  Diagnosis Date  . Hypertension   . Asthma   . Obesity, Class III, BMI 40-49.9 (morbid obesity) ([place])   . Acute renal failure ([place])   . GERD (gastroesophageal reflux disease)   . Inguinal hernia   . Periumbilical hernia   . Fatty liver 11/2010  . 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 Common bile duct stone   . Hx: UTI (urinary tract infection)   . Eosinophilia 01/16/2011  . 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 Arthritis    Past Surgical History  Procedure Laterality Date  . Abdominal hysterectomy    . Cholecystectomy  2001  . Ercp/sphincterotomy/stone extraction  2001  . Bronchoscopy, lymph node bx  MAY 2011  . Flexible sigmoidoscopy  11/29/2010    Procedure: FLEXIBLE SIGMOIDOSCOPY;  Surgeon: Lafayette Dragon, MD;  Location: [facility] ENDOSCOPY;  Service: Endoscopy;  Laterality: N/A;   Family History  Problem Relation Age of Onset  . Diabetes Mother   . Diabetes Sister   . Colon cancer Father     ?   Social History  Substance Use Topics  . Smoking status: Current Every Day Smoker -- 0.50 packs/day for 30 years    Types: Cigarettes  . Smokeless tobacco: Never Used  .  Alcohol Use: No   OB History    No data available     Review of Systems  Constitutional: Negative for fever and chills.  HENT: Positive for nosebleeds. Negative for facial swelling and trouble swallowing.   Respiratory: Negative for shortness of breath.   Neurological: Negative for dizziness, numbness and headaches.  All other systems reviewed and are negative.     Allergies  Review of patient's allergies indicates no known allergies.  Home Medications   Prior to Admission medications   Medication Sig Start Date End Date Taking? Authorizing Provider  acetaminophen (TYLENOL) 500 MG tablet Take 1,500 mg by mouth every 6 (six) hours as needed. For pain   Yes Historical Provider, MD  albuterol (PROVENTIL HFA;VENTOLIN HFA) 108 (90 BASE) MCG/ACT inhaler Inhale 2 puffs into the lungs every 6 (six) hours as needed. For asthma 12/01/10  Yes Ripudeep Krystal Eaton, MD  aspirin EC 81 MG tablet Take 81 mg by mouth daily as needed. For pain    Yes Historical Provider, MD  diphenoxylate-atropine (LOMOTIL) 2.5-0.025 MG per tablet Take 1 tablet by mouth 4 (four) times daily as needed. For diarrhea   Yes Historical Provider, MD  furosemide (LASIX) 20 MG tablet TAKE 1 TABLET BY MOUTH EVERY DAY FOR FLUID RETENTION 03/04/11  Yes Lafayette Dragon, MD  gabapentin (NEURONTIN) 300 MG capsule Take 300 mg by mouth daily as needed (pain).  Yes Historical Provider, MD  lisinopril-hydrochlorothiazide (PRINZIDE,ZESTORETIC) 20-25 MG per tablet Take 1 tablet by mouth daily.  11/09/10  Yes Historical Provider, MD  traMADol (ULTRAM) 50 MG tablet 2 (two) times daily as needed.  09/12/10  Yes Historical Provider, MD  oxymetazoline (AFRIN NASAL SPRAY) 0.05 % nasal spray Place 2 sprays into right nostril 2 (two) times daily. Use at the onset of nosebleed. Apply direct pressure after administration. 06/17/15   Shawn C Joy, PA-C   BP 174/97 mmHg  Pulse 76  Temp(Src) 98.3 F (36.8 C) (Oral)  Resp 20  Ht 5\' 6"  (1.676 m)  Wt 127.007 kg   BMI 45.21 kg/m2  SpO2 96% Physical Exam  Constitutional: She is oriented to person, place, and time. She appears well-developed and well-nourished. No distress.  HENT:  Head: Normocephalic and atraumatic.  Nose: No sinus tenderness or nasal deformity. Epistaxis is observed.  Source of the bleeding seems to be anterior.  Eyes: Conjunctivae are normal.  Neck: Neck supple.  Cardiovascular: Normal rate, regular rhythm and intact distal pulses.   Pulmonary/Chest: Effort normal and breath sounds normal. No respiratory distress.  Abdominal: Soft. There is no tenderness. There is no guarding.  Musculoskeletal: She exhibits no edema or tenderness.  Neurological: She is alert and oriented to person, place, and time.  Skin: Skin is warm and dry. She is not diaphoretic.  Psychiatric: She has a normal mood and affect. Her behavior is normal.  Nursing note and vitals reviewed.   ED Course  Procedures (including critical care time) Labs Review Labs Reviewed  CBC WITH DIFFERENTIAL/PLATELET - Abnormal; Notable for the following:    WBC 11.8 (*)    Neutro Abs 8.3 (*)    All other components within normal limits  BASIC METABOLIC PANEL  TYPE AND SCREEN  ABO/RH    Imaging Review No results found. I have personally reviewed and evaluated these images and lab results as part of my medical decision-making.   EKG Interpretation None      Medications  lidocaine-EPINEPHrine (XYLOCAINE W/EPI) 2 %-1:200000 (PF) injection 20 mL (not administered)  oxymetazoline (AFRIN) 0.05 % nasal spray 2 spray (2 sprays Right Nare Given by Other 06/17/15 0735)    MDM   Final diagnoses:  Epistaxis    [place] presents with epistaxis since around 1 AM his morning.  Findings and plan of care discussed with Forde Dandy, MD. Dr. Oleta Mouse personally evaluated and examined this patient.  Epistaxis failed to be controlled with direct pressure for 15 minutes. Patient was counseled on correct direct pressure.  Bleeding was able to be controlled with a combination of Afrin and then lidocaine with epi. Patient was then observed for a period of time to assure no recurrence. Patient was given strict return precautions as well as advised to follow-up with ENT outpatient. Patient's hypertension is noted, but seems to be responding to her medication she took prior to arrival. Patient voiced understanding of these instructions, accepts the plan, and is comfortable with discharge.  Filed Vitals:   06/17/15 0635 06/17/15 0745 06/17/15 0845  BP: 186/93 159/97 174/97  Pulse: 85 85 76  Temp: 98.3 F (36.8 C)    TempSrc: Oral    Resp: 18 22 20   Height: 5\' 6"  (1.676 m)    Weight: 127.007 kg    SpO2: 100% 100% 96%     Lorayne Bender, PA-C 06/17/15 [place] Liu, MD 06/17/15 1744

## 2015-06-17 NOTE — Discharge Instructions (Signed)
You have been seen today for a nosebleed. Your lab tests showed no abnormalities. Follow up with ENT by Monday, June 12. Call the number provided to set up an appointment. Follow up with PCP as needed. Return to ED should symptoms recur as they did today. If bleeding starts again, use 2 sprays of the Afrin nasal spray in the affected nostril and hold direct pressure.

## 2015-06-17 NOTE — ED Notes (Signed)
Per pt she has been having a nose bleed since 1 am today. The pt states that she has been pulling clots out of her mouth and her nose. Pt states that she has saturated several wash cloths, paper towels, and tissues.

## 2015-06-17 NOTE — ED Notes (Signed)
Pt verbalized understanding of d/c instructions, prescriptions, and follow-up care. No further questions/concerns, VSS, assisted to lobby in wheelchair.  

## 2015-06-17 NOTE — ED Notes (Signed)
Dr. Liu at bedside 

## 2015-06-17 NOTE — ED Provider Notes (Signed)
Medical screening examination/treatment/procedure(s) were conducted as a shared visit with non-physician practitioner(s) and myself.  I personally evaluated the patient during the encounter.   EKG Interpretation None      50 year old female with history of hypertension, obesity, and asthma who presents with epistaxis. Takes baby aspirin but no other blood thinners. States that 1 AM woke up with bleeding from the right side of her nose and down the back of her throat. Unable to get it to stop, so came to the ED for evaluation. No syncope or near syncope, difficulty breathing. Hypertensive on presentation but otherwise hemodynamically stable. I was called to the room by the nurse given recurrence of bleeding while here in the ED. Bleeding profusely from the right naris.  Neo-Synephrine was applied at bedside and deep pressure held for about 10 minutes. Bleeding subsequently ceased. Obtaining basic blood work and will observe in the ED for rebleeding.   Stable hgb. Vital signs remain stable. No further bleeding on re-evaluation. Provided with neosynephrine and instructed on applying deep pressure if recurrence x 15-20 minutes x 2 and return to ED if persistent bleeding as may require packing and further intervention. Will refer to ENT prn.   Forde Dandy, MD 06/17/15 [phone]

## 2016-03-22 ENCOUNTER — Other Ambulatory Visit: Payer: Self-pay | Admitting: Internal Medicine

## 2016-03-22 DIAGNOSIS — E2839 Other primary ovarian failure: Secondary | ICD-10-CM

## 2016-03-22 DIAGNOSIS — Z1231 Encounter for screening mammogram for malignant neoplasm of breast: Secondary | ICD-10-CM

## 2016-04-19 ENCOUNTER — Ambulatory Visit
Admission: RE | Admit: 2016-04-19 | Discharge: 2016-04-19 | Disposition: A | Discharge status: Home / Self Care | Payer: BLUE CROSS/BLUE SHIELD | Source: Ambulatory Visit | Attending: Internal Medicine | Admitting: Internal Medicine

## 2016-04-19 DIAGNOSIS — Z1231 Encounter for screening mammogram for malignant neoplasm of breast: Secondary | ICD-10-CM

## 2016-04-19 DIAGNOSIS — E2839 Other primary ovarian failure: Secondary | ICD-10-CM

## 2019-03-26 ENCOUNTER — Other Ambulatory Visit: Payer: Self-pay

## 2019-03-26 ENCOUNTER — Encounter (HOSPITAL_COMMUNITY): Payer: Self-pay

## 2019-03-26 ENCOUNTER — Ambulatory Visit (HOSPITAL_COMMUNITY)
Admission: EM | Admit: 2019-03-26 | Discharge: 2019-03-26 | Disposition: A | Discharge status: Home / Self Care | Payer: 59 | Attending: Family Medicine | Admitting: Family Medicine

## 2019-03-26 DIAGNOSIS — M25561 Pain in right knee: Secondary | ICD-10-CM

## 2019-03-26 DIAGNOSIS — M25562 Pain in left knee: Secondary | ICD-10-CM

## 2019-03-26 DIAGNOSIS — K0889 Other specified disorders of teeth and supporting structures: Secondary | ICD-10-CM | POA: Diagnosis not present

## 2019-03-26 DIAGNOSIS — I10 Essential (primary) hypertension: Secondary | ICD-10-CM

## 2019-03-26 MED ORDER — PENICILLIN V POTASSIUM 500 MG PO TABS: 500.0000 mg | ORAL_TABLET | Freq: Three times a day (TID) | ORAL | 0 refills | Status: DC

## 2019-03-26 MED ORDER — MELOXICAM 15 MG PO TABS: 15.0000 mg | ORAL_TABLET | Freq: Every day | ORAL | 0 refills | Status: DC

## 2019-03-26 MED ORDER — LISINOPRIL-HYDROCHLOROTHIAZIDE 20-25 MG PO TABS: 1.0000 | ORAL_TABLET | Freq: Every day | ORAL | 1 refills | Status: DC

## 2019-03-26 NOTE — ED Provider Notes (Signed)
[place]   [id number] 03/26/19 Arrival Time: 1320  ASSESSMENT & PLAN:  1. Pain, dental   2. Essential hypertension   3. Pain in both knees, unspecified chronicity    ' No sign of abscess requiring I&D at this time. Discussed. Refilled HTN medication at her request.   Meds ordered this encounter  Medications  . lisinopril-hydrochlorothiazide (ZESTORETIC) 20-25 MG tablet    Sig: Take 1 tablet by mouth daily.    Dispense:  30 tablet    Refill:  1  . meloxicam (MOBIC) 15 MG tablet    Sig: Take 1 tablet (15 mg total) by mouth daily.    Dispense:  30 tablet    Refill:  0  . penicillin v potassium (VEETID) 500 MG tablet    Sig: Take 1 tablet (500 mg total) by mouth 3 (three) times daily.    Dispense:  30 tablet    Refill:  0    Dental resource written instructions given. She will schedule dental evaluation as soon as possible if not improving over the next 24-48 hours.  Reviewed expectations re: course of current medical issues. Questions answered. Outlined signs and symptoms indicating need for more acute intervention. Patient verbalized understanding. After Visit Summary given.   SUBJECTIVE:  Alexandra Bell is a 54 y.o. female who reports gradual onset of left upper dental pain described as aching. "Broken tooth; may have cut my tongue too". Pain present for at least one week. Fever: absent. Tolerating PO intake but reports pain with chewing. Normal swallowing. She does not see a dentist regularly. No neck swelling or pain. OTC analgesics without relief.  Also requests refill of HTN medication. Reports BP is usu controlled when taking medication.  On/off bilateral knee aches/pains. Long-standing. Requests Rx to help with soreness. No injuries. Ambulatory with cane for assistance.   OBJECTIVE: Vitals:   03/26/19 1340 03/26/19 1344  BP:  (!) 131/96  Pulse:  95  Resp:  18  Temp:  99.2 F (37.3 C)  TempSrc:  Oral  SpO2:  98%  Weight: 127 kg      General appearance: alert; no distress HENT: normocephalic; atraumatic; dentition: fair; left upper rear gums without areas of fluctuance, drainage, or bleeding and with tenderness to palpation; normal jaw movement without difficulty; lateral left tongue with mild erythema and no obvious laceration Neck: supple without LAD; FROM; trachea midline Lungs: normal respirations; unlabored Skin: warm and dry Psychological: alert and cooperative; normal mood and affect  No Known Allergies  Past Medical History:  Diagnosis Date  . Acute renal failure ([place])   . Arthritis   . Asthma   . 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 Common bile duct stone   . Eosinophilia 01/16/2011  . 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 Fatty liver 11/2010  . GERD (gastroesophageal reflux disease)   . Hx: UTI (urinary tract infection)   . Hypertension   . Inguinal hernia   . Obesity, Class III, BMI 40-49.9 (morbid obesity) ([place])   . Periumbilical hernia    Social History   Socioeconomic History  . Marital status: Married    Spouse name: Not on file  . Number of children: 2  . Years of education: Not on file  . Highest education level: Not on file  Occupational History  . Occupation: Theme park manager, Metallurgist: UNEMPLOYED  Tobacco Use  . Smoking status: Current Every Day Smoker    Packs/day: 0.50    Years: 30.00    Pack  years: 15.00    Types: Cigarettes  . Smokeless tobacco: Never Used  Substance and Sexual Activity  . Alcohol use: No  . Drug use: No  . Sexual activity: Not on file  Other Topics Concern  . Not on file  Social History Narrative  . Not on file   Social Determinants of Health   Financial Resource Strain:   . Difficulty of Paying Living Expenses:   Food Insecurity:   . Worried About Charity fundraiser in the Last Year:   . Arboriculturist in the Last Year:   Transportation Needs:   . Film/video editor (Medical):   Marland Kitchen Lack of Transportation (Non-Medical):   Physical Activity:    . Days of Exercise per Week:   . Minutes of Exercise per Session:   Stress:   . Feeling of Stress :   Social Connections:   . Frequency of Communication with Friends and Family:   . Frequency of Social Gatherings with Friends and Family:   . Attends Religious Services:   . Active Member of Clubs or Organizations:   . Attends Archivist Meetings:   Marland Kitchen Marital Status:   Intimate Partner Violence:   . Fear of Current or Ex-Partner:   . Emotionally Abused:   Marland Kitchen Physically Abused:   . Sexually Abused:    Family History  Problem Relation Age of Onset  . Diabetes Mother   . Colon cancer Father        ?  . Diabetes Sister    Past Surgical History:  Procedure Laterality Date  . ABDOMINAL HYSTERECTOMY    . BRONCHOSCOPY, LYMPH NODE BX  MAY 2011  . CHOLECYSTECTOMY  2001  . ERCP/SPHINCTEROTOMY/STONE EXTRACTION  2001  . FLEXIBLE SIGMOIDOSCOPY  11/29/2010   Procedure: FLEXIBLE SIGMOIDOSCOPY;  Surgeon: Lafayette Dragon, MD;  Location: [facility] ENDOSCOPY;  Service: Endoscopy;  Laterality: N/AVanessa Kick, MD 03/26/19 [phone]

## 2019-03-26 NOTE — ED Triage Notes (Signed)
Pt state she has a bad tooth that has cut her tongue. Pt has been dealing with this for a week now.

## 2019-05-07 ENCOUNTER — Ambulatory Visit (INDEPENDENT_AMBULATORY_CARE_PROVIDER_SITE_OTHER): Payer: 59 | Admitting: Nurse Practitioner

## 2019-05-07 ENCOUNTER — Other Ambulatory Visit: Payer: Self-pay

## 2019-05-07 ENCOUNTER — Telehealth: Payer: Self-pay

## 2019-05-07 ENCOUNTER — Encounter: Payer: Self-pay | Admitting: Nurse Practitioner

## 2019-05-07 VITALS — BP 176/94 | HR 110 | Temp 98.5°F | Ht 65.4 in | Wt 295.6 lb

## 2019-05-07 DIAGNOSIS — M25562 Pain in left knee: Secondary | ICD-10-CM

## 2019-05-07 DIAGNOSIS — Z13228 Encounter for screening for other metabolic disorders: Secondary | ICD-10-CM

## 2019-05-07 DIAGNOSIS — M25561 Pain in right knee: Secondary | ICD-10-CM

## 2019-05-07 DIAGNOSIS — M79642 Pain in left hand: Secondary | ICD-10-CM

## 2019-05-07 DIAGNOSIS — M545 Low back pain, unspecified: Secondary | ICD-10-CM

## 2019-05-07 DIAGNOSIS — Z1231 Encounter for screening mammogram for malignant neoplasm of breast: Secondary | ICD-10-CM

## 2019-05-07 DIAGNOSIS — M79641 Pain in right hand: Secondary | ICD-10-CM

## 2019-05-07 DIAGNOSIS — I10 Essential (primary) hypertension: Secondary | ICD-10-CM

## 2019-05-07 DIAGNOSIS — G8929 Other chronic pain: Secondary | ICD-10-CM

## 2019-05-07 LAB — POCT URINALYSIS DIPSTICK
Bilirubin, UA: NEGATIVE
Blood, UA: NEGATIVE
Glucose, UA: NEGATIVE
Ketones, UA: NEGATIVE
Leukocytes, UA: NEGATIVE
Nitrite, UA: NEGATIVE
Protein, UA: NEGATIVE
Spec Grav, UA: >=1.03 — AB (ref 1.010–1.025)
Urobilinogen, UA: 0.2 E.U./dL
pH, UA: 5.5 (ref 5.0–8.0)

## 2019-05-07 LAB — POCT UA - MICROALBUMIN
Creatinine, POC: 300 mg/dL
Microalbumin Ur, POC: 80 mg/L

## 2019-05-07 MED ORDER — DICLOFENAC SODIUM 1 % EX GEL: 2.0000 g | Freq: Four times a day (QID) | CUTANEOUS | 3 refills | Status: DC

## 2019-05-07 MED ORDER — GABAPENTIN 300 MG PO CAPS: 300.0000 mg | ORAL_CAPSULE | Freq: Three times a day (TID) | ORAL | 1 refills | Status: DC

## 2019-05-07 MED ORDER — TRIAMCINOLONE ACETONIDE 40 MG/ML IJ SUSP
40.0000 mg | Freq: Once | INTRAMUSCULAR | Status: AC
  Administered 2019-05-07: 40 mg via INTRAMUSCULAR

## 2019-05-07 MED ORDER — TRAMADOL HCL 50 MG PO TABS: 50.0000 mg | ORAL_TABLET | Freq: Four times a day (QID) | ORAL | 0 refills | Status: DC | PRN

## 2019-05-07 MED ORDER — LISINOPRIL-HYDROCHLOROTHIAZIDE 20-25 MG PO TABS: 1.0000 | ORAL_TABLET | Freq: Every day | ORAL | 1 refills | Status: DC

## 2019-05-07 MED ORDER — MAGNESIUM 200 MG PO TABS: ORAL_TABLET | ORAL | 1 refills | Status: DC

## 2019-05-07 MED ORDER — KETOROLAC TROMETHAMINE 60 MG/2ML IM SOLN
60.0000 mg | Freq: Once | INTRAMUSCULAR | Status: AC
  Administered 2019-05-07: 60 mg via INTRAMUSCULAR

## 2019-05-07 NOTE — Progress Notes (Signed)
This visit occurred during the SARS-CoV-2 public health emergency.  Safety protocols were in place, including screening questions prior to the visit, additional usage of staff PPE, and extensive cleaning of exam room while observing appropriate contact time as indicated for disinfecting solutions.  Subjective:     Patient ID: Alexandra Bell , female    DOB: May 30, 1965 , 54 y.o.   MRN: [id number]   Chief Complaint  Patient presents with  . Establish Care    HPI  Here to establish care.  She was to Dr. Jeanie Cooks 2 years ago, did not have any insurance and she had lost her husband last year.  She has 3 children - healthy.  Works part time - service work for 12 years.  Widow.    PMH - arthritis (severe per patient has not seen a specialist in more than 2 years. HTN - more than 10 years.  She has not had any treatment for her inguinal and periumbilical hernia. Partial hysterectomy.  Degenerative disc disease ([facility]) she also has numbness and tingling in her feet.   [facility] - father - colon cancer, mother - diabetes, HTN. Brother - gout, hypertension.    Hand Pain  The incident occurred more than 1 week ago. There was no injury mechanism. Pain location: bilateral hand pain. The quality of the pain is described as aching. The pain does not radiate. The pain has been constant since the incident. Associated symptoms include numbness. Pertinent negatives include no chest pain or tingling. She has tried NSAIDs for the symptoms.  Hypertension This is a chronic problem. The current episode started more than 1 year ago. The problem is controlled. Pertinent negatives include no anxiety, chest pain or palpitations. There are no associated agents to hypertension. Risk factors for coronary artery disease include obesity and sedentary lifestyle. Past treatments include diuretics and ACE inhibitors. There are no compliance problems.  There is no history of angina. There is no history of chronic renal  disease.     Past Medical History:  Diagnosis Date  . Acute renal failure ([place])   . Arthritis   . Asthma   . Colitis, eosinophilic 38/01/8297  . Common bile duct stone   . Eosinophilia 01/16/2011  . Eosinophilic enteritis 37/1696  . Fatty liver 11/2010  . GERD (gastroesophageal reflux disease)   . Hx: UTI (urinary tract infection)   . Hypertension   . Inguinal hernia   . Obesity, Class III, BMI 40-49.9 (morbid obesity) ([place])   . Periumbilical hernia      Family History  Problem Relation Age of Onset  . Diabetes Mother   . Colon cancer Father        ?  . Diabetes Sister      Current Outpatient Medications:  .  acetaminophen (TYLENOL) 500 MG tablet, Take 1,500 mg by mouth every 6 (six) hours as needed. For pain, Disp: , Rfl:  .  albuterol (PROVENTIL HFA;VENTOLIN HFA) 108 (90 BASE) MCG/ACT inhaler, Inhale 2 puffs into the lungs every 6 (six) hours as needed. For asthma, Disp: 1 Inhaler, Rfl: 3 .  aspirin EC 81 MG tablet, Take 81 mg by mouth daily as needed. For pain , Disp: , Rfl:  .  diphenoxylate-atropine (LOMOTIL) 2.5-0.025 MG per tablet, Take 1 tablet by mouth 4 (four) times daily as needed. For diarrhea, Disp: , Rfl:  .  gabapentin (NEURONTIN) 300 MG capsule, Take 300 mg by mouth daily as needed (pain)., Disp: , Rfl:  .  lisinopril-hydrochlorothiazide (ZESTORETIC)  20-25 MG tablet, Take 1 tablet by mouth daily., Disp: 30 tablet, Rfl: 1 .  furosemide (LASIX) 20 MG tablet, TAKE 1 TABLET BY MOUTH EVERY DAY FOR FLUID RETENTION (Patient not taking: Reported on 05/07/2019), Disp: 30 tablet, Rfl: 0 .  meloxicam (MOBIC) 15 MG tablet, Take 1 tablet (15 mg total) by mouth daily. (Patient not taking: Reported on 05/07/2019), Disp: 30 tablet, Rfl: 0 .  oxymetazoline (AFRIN NASAL SPRAY) 0.05 % nasal spray, Place 2 sprays into right nostril 2 (two) times daily. Use at the onset of nosebleed. Apply direct pressure after administration. (Patient not taking: Reported on 05/07/2019), Disp: 30 mL,  Rfl: 0 .  traMADol (ULTRAM) 50 MG tablet, 2 (two) times daily as needed. , Disp: , Rfl:    No Known Allergies   Review of Systems  Constitutional: Negative.   Respiratory: Negative.   Cardiovascular: Negative for chest pain, palpitations and leg swelling.  Musculoskeletal: Positive for arthralgias and back pain.  Neurological: Positive for numbness. Negative for dizziness and tingling.  Psychiatric/Behavioral: Negative.      Today's Vitals   05/07/19 1431  BP: (!) 176/94  Pulse: (!) 110  Temp: 98.5 F (36.9 C)  TempSrc: Oral  Weight: 295 lb 9.6 oz (134.1 kg)  Height: 5' 5.4" (1.661 m)   Body mass index is 48.59 kg/m.   Objective:  Physical Exam Vitals reviewed.  Constitutional:      General: She is not in acute distress.    Appearance: Normal appearance. She is well-developed.  Eyes:     Extraocular Movements: Extraocular movements intact.     Conjunctiva/sclera: Conjunctivae normal.     Pupils: Pupils are equal, round, and reactive to light.  Cardiovascular:     Rate and Rhythm: Normal rate and regular rhythm.     Pulses: Normal pulses.     Heart sounds: Normal heart sounds. No murmur.  Pulmonary:     Effort: Pulmonary effort is normal. No respiratory distress.     Breath sounds: Normal breath sounds.  Musculoskeletal:        General: Tenderness (low back tenderness) present. No swelling or deformity.     Right lower leg: No edema.     Left lower leg: No edema.  Skin:    General: Skin is warm and dry.     Capillary Refill: Capillary refill takes less than 2 seconds.  Neurological:     General: No focal deficit present.     Mental Status: She is alert and oriented to person, place, and time.     Cranial Nerves: No cranial nerve deficit.  Psychiatric:        Mood and Affect: Mood normal.        Behavior: Behavior normal.        Thought Content: Thought content normal.        Judgment: Judgment normal.         Assessment And Plan:     1. Essential  hypertension, benign  Blood pressure is elevated today, she is to add magnesium to her regimen  EKG done with NSR with left atrial enlargement HR 82, s - EKG 12-Lead - POCT Urinalysis Dipstick (81002) - POCT UA - Microalbumin - CMP14+EGFR - CBC - Magnesium 200 MG TABS; Take 1 tablet by mouth daily with evening meal  Dispense: 90 tablet; Refill: 1  2. Bilateral hand pain  I will check for autoimmune panel and refer to PT and orthopedic for further evaluation - Autoimmune Profile - Sed Rate (  ESR) - Uric acid - diclofenac Sodium (VOLTAREN) 1 % GEL; Apply 2 g topically 4 (four) times daily.  Dispense: 100 g; Refill: 3 - ketorolac (TORADOL) injection 60 mg - triamcinolone acetonide (KENALOG-40) injection 40 mg - Ambulatory referral to Orthopedic Surgery - Ambulatory referral to Physical Therapy - gabapentin (NEURONTIN) 300 MG capsule; Take 1 capsule (300 mg total) by mouth 3 (three) times daily.  Dispense: 240 capsule; Refill: 1  3. Chronic low back pain without sciatica, unspecified back pain laterality  Will treat with voltaren gel, kenalog and toradol until she is able to get PT and to orthopedics. - diclofenac Sodium (VOLTAREN) 1 % GEL; Apply 2 g topically 4 (four) times daily.  Dispense: 100 g; Refill: 3 - ketorolac (TORADOL) injection 60 mg - triamcinolone acetonide (KENALOG-40) injection 40 mg - Ambulatory referral to Orthopedic Surgery - Ambulatory referral to Physical Therapy - gabapentin (NEURONTIN) 300 MG capsule; Take 1 capsule (300 mg total) by mouth 3 (three) times daily.  Dispense: 240 capsule; Refill: 1  4. Encounter for screening mammogram for breast cancer  Pt instructed on Self Breast Exam.According to ACOG guidelines Women aged 74 and older are recommended to get an annual mammogram. Form completed and given to patient contact the [facility] for appointment scheduing.   Pt encouraged to get annual mammogram - MM DIGITAL SCREENING BILATERAL; Future  5.  Encounter for screening for metabolic disorder  - Hemoglobin A1c - Lipid panel  6. Arthralgia of both knees  This is long term per patient will refer to orthopedics  Limited supply of tramadol given  - diclofenac Sodium (VOLTAREN) 1 % GEL; Apply 2 g topically 4 (four) times daily.  Dispense: 100 g; Refill: 3 - ketorolac (TORADOL) injection 60 mg - triamcinolone acetonide (KENALOG-40) injection 40 mg - Ambulatory referral to Orthopedic Surgery - Ambulatory referral to Physical Therapy - traMADol (ULTRAM) 50 MG tablet; Take 1 tablet (50 mg total) by mouth every 6 (six) hours as needed.  Dispense: 20 tablet; Refill: 0   [name], FNP    THE PATIENT IS ENCOURAGED TO PRACTICE SOCIAL DISTANCING DUE TO THE COVID-19 PANDEMIC.

## 2019-05-08 LAB — LIPID PANEL
Chol/HDL Ratio: 3.1 ratio (ref 0.0–4.4)
Cholesterol, Total: 188 mg/dL (ref 100–199)
HDL: 61 mg/dL (ref >39)
LDL Chol Calc (NIH): 105 mg/dL — ABNORMAL HIGH (ref 0–99)
Triglycerides: 123 mg/dL (ref 0–149)
VLDL Cholesterol Cal: 22 mg/dL (ref 5–40)

## 2019-05-08 LAB — AUTOIMMUNE PROFILE
Anti Nuclear Antibody (ANA): NEGATIVE
Complement C3, Serum: 147 mg/dL (ref 82–167)
dsDNA Ab: <1 IU/mL (ref 0–9)

## 2019-05-08 LAB — CMP14+EGFR
ALT: 11 IU/L (ref 0–32)
AST: 15 IU/L (ref 0–40)
Albumin/Globulin Ratio: 1.3 (ref 1.2–2.2)
Albumin: 3.8 g/dL (ref 3.8–4.9)
Alkaline Phosphatase: 94 IU/L (ref 39–117)
BUN/Creatinine Ratio: 16 (ref 9–23)
BUN: 11 mg/dL (ref 6–24)
Bilirubin Total: 0.4 mg/dL (ref 0.0–1.2)
CO2: 23 mmol/L (ref 20–29)
Calcium: 10.1 mg/dL (ref 8.7–10.2)
Chloride: 104 mmol/L (ref 96–106)
Creatinine, Ser: 0.69 mg/dL (ref 0.57–1.00)
GFR calc Af Amer: 113 mL/min/{1.73_m2} (ref >59)
GFR calc non Af Amer: 98 mL/min/{1.73_m2} (ref >59)
Globulin, Total: 2.9 g/dL (ref 1.5–4.5)
Glucose: 95 mg/dL (ref 65–99)
Potassium: 4.5 mmol/L (ref 3.5–5.2)
Sodium: 139 mmol/L (ref 134–144)
Total Protein: 6.7 g/dL (ref 6.0–8.5)

## 2019-05-08 LAB — HEMOGLOBIN A1C
Est. average glucose Bld gHb Est-mCnc: 126 mg/dL
Hgb A1c MFr Bld: 6 % — ABNORMAL HIGH (ref 4.8–5.6)

## 2019-05-08 LAB — CBC
Hematocrit: 43.6 % (ref 34.0–46.6)
Hemoglobin: 14.5 g/dL (ref 11.1–15.9)
MCH: 29.8 pg (ref 26.6–33.0)
MCHC: 33.3 g/dL (ref 31.5–35.7)
MCV: 90 fL (ref 79–97)
Platelets: 270 10*3/uL (ref 150–450)
RBC: 4.86 x10E6/uL (ref 3.77–5.28)
RDW: 12.7 % (ref 11.7–15.4)
WBC: 7.4 10*3/uL (ref 3.4–10.8)

## 2019-05-08 LAB — SEDIMENTATION RATE: Sed Rate: 28 mm/hr (ref 0–40)

## 2019-05-08 LAB — URIC ACID: Uric Acid: 6 mg/dL (ref 3.0–7.2)

## 2019-05-11 ENCOUNTER — Other Ambulatory Visit: Payer: Self-pay

## 2019-05-11 NOTE — Telephone Encounter (Signed)
error 

## 2019-05-21 ENCOUNTER — Other Ambulatory Visit: Payer: Self-pay | Admitting: Nurse Practitioner

## 2019-05-21 DIAGNOSIS — M25561 Pain in right knee: Secondary | ICD-10-CM

## 2019-05-21 DIAGNOSIS — M25562 Pain in left knee: Secondary | ICD-10-CM

## 2019-05-21 NOTE — Telephone Encounter (Signed)
Tramadol refill

## 2019-06-04 ENCOUNTER — Ambulatory Visit: Payer: 59 | Admitting: Nurse Practitioner

## 2019-06-04 ENCOUNTER — Other Ambulatory Visit: Payer: Self-pay

## 2019-06-04 ENCOUNTER — Encounter: Payer: Self-pay | Admitting: Nurse Practitioner

## 2019-06-04 ENCOUNTER — Ambulatory Visit (INDEPENDENT_AMBULATORY_CARE_PROVIDER_SITE_OTHER): Payer: 59 | Admitting: Nurse Practitioner

## 2019-06-04 VITALS — BP 130/98 | HR 110 | Temp 97.8°F | Ht 65.4 in | Wt 290.0 lb

## 2019-06-04 DIAGNOSIS — R7309 Other abnormal glucose: Secondary | ICD-10-CM

## 2019-06-04 DIAGNOSIS — I10 Essential (primary) hypertension: Secondary | ICD-10-CM

## 2019-06-04 DIAGNOSIS — M25561 Pain in right knee: Secondary | ICD-10-CM

## 2019-06-04 DIAGNOSIS — G8929 Other chronic pain: Secondary | ICD-10-CM

## 2019-06-04 DIAGNOSIS — Z6841 Body Mass Index (BMI) 40.0 and over, adult: Secondary | ICD-10-CM

## 2019-06-04 DIAGNOSIS — M25562 Pain in left knee: Secondary | ICD-10-CM

## 2019-06-04 MED ORDER — METFORMIN HCL 500 MG PO TABS: 500.0000 mg | ORAL_TABLET | Freq: Two times a day (BID) | ORAL | 2 refills | Status: DC

## 2019-06-04 MED ORDER — AMLODIPINE BESYLATE 2.5 MG PO TABS: 2.5000 mg | ORAL_TABLET | Freq: Every day | ORAL | 2 refills | Status: DC

## 2019-06-04 MED ORDER — AMLODIPINE BESYLATE 2.5 MG PO TABS: 2.5000 mg | ORAL_TABLET | Freq: Every day | ORAL | 11 refills | Status: DC

## 2019-06-04 MED ORDER — HYDROCODONE-ACETAMINOPHEN 5-325 MG PO TABS: 1.0000 | ORAL_TABLET | Freq: Four times a day (QID) | ORAL | 0 refills | Status: DC | PRN

## 2019-06-04 NOTE — Progress Notes (Signed)
This visit occurred during the SARS-CoV-2 public health emergency.  Safety protocols were in place, including screening questions prior to the visit, additional usage of staff PPE, and extensive cleaning of exam room while observing appropriate contact time as indicated for disinfecting solutions.  Subjective:     Patient ID: Alexandra Bell , female    DOB: Nov 20, 1965 , 54 y.o.   MRN: [id number]   Chief Complaint  Patient presents with  . Hypertension    HPI  Hypertension This is a chronic problem. The current episode started more than 1 year ago. The problem is uncontrolled. Pertinent negatives include no anxiety, chest pain, headaches or palpitations. Risk factors for coronary artery disease include sedentary lifestyle and obesity. Past treatments include ACE inhibitors and diuretics. There are no compliance problems.  There is no history of angina or kidney disease. There is no history of chronic renal disease.     Past Medical History:  Diagnosis Date  . Acute renal failure ([place])   . Arthritis   . Asthma   . 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 Common bile duct stone   . Eosinophilia 01/16/2011  . 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 Fatty liver 11/2010  . GERD (gastroesophageal reflux disease)   . Hx: UTI (urinary tract infection)   . Hypertension   . Inguinal hernia   . Obesity, Class III, BMI 40-49.9 (morbid obesity) ([place])   . Periumbilical hernia      Family History  Problem Relation Age of Onset  . Diabetes Mother   . Colon cancer Father        ?  . Diabetes Sister      Current Outpatient Medications:  .  acetaminophen (TYLENOL) 500 MG tablet, Take 1,500 mg by mouth every 6 (six) hours as needed. For pain, Disp: , Rfl:  .  albuterol (PROVENTIL HFA;VENTOLIN HFA) 108 (90 BASE) MCG/ACT inhaler, Inhale 2 puffs into the lungs every 6 (six) hours as needed. For asthma, Disp: 1 Inhaler, Rfl: 3 .  aspirin EC 81 MG tablet, Take 81 mg by mouth daily as needed. For pain ,  Disp: , Rfl:  .  diclofenac Sodium (VOLTAREN) 1 % GEL, Apply 2 g topically 4 (four) times daily., Disp: 100 g, Rfl: 3 .  gabapentin (NEURONTIN) 300 MG capsule, Take 1 capsule (300 mg total) by mouth 3 (three) times daily., Disp: 240 capsule, Rfl: 1 .  lisinopril-hydrochlorothiazide (ZESTORETIC) 20-25 MG tablet, Take 1 tablet by mouth daily., Disp: 90 tablet, Rfl: 1 .  Magnesium 200 MG TABS, Take 1 tablet by mouth daily with evening meal, Disp: 90 tablet, Rfl: 1 .  traMADol (ULTRAM) 50 MG tablet, Take 1 tablet (50 mg total) by mouth every 6 (six) hours as needed., Disp: 20 tablet, Rfl: 0   No Known Allergies   Review of Systems  Constitutional: Negative.   Respiratory: Negative.   Cardiovascular: Negative.  Negative for chest pain, palpitations and leg swelling.  Neurological: Negative for dizziness and headaches.  Psychiatric/Behavioral: Negative.      Today's Vitals   06/04/19 1438 06/04/19 1459  BP: (!) 180/98 (!) 130/98  Pulse: (!) 110   Temp: 97.8 F (36.6 C)   TempSrc: Oral   SpO2: 96%   Weight: 290 lb (131.5 kg)   Height: 5' 5.4" (1.661 m)    Body mass index is 47.67 kg/m.   Objective:  Physical Exam Cardiovascular:     Rate and Rhythm: Normal rate and regular rhythm.     Pulses: Normal  pulses.     Heart sounds: Normal heart sounds. No murmur.  Pulmonary:     Effort: Pulmonary effort is normal. No respiratory distress.     Breath sounds: Normal breath sounds.  Musculoskeletal:        General: No swelling, tenderness or deformity.  Skin:    Capillary Refill: Capillary refill takes less than 2 seconds.  Neurological:     General: No focal deficit present.     Mental Status: She is alert and oriented to person, place, and time.     Cranial Nerves: No cranial nerve deficit.  Psychiatric:        Mood and Affect: Mood normal.        Behavior: Behavior normal.        Thought Content: Thought content normal.        Judgment: Judgment normal.         Assessment  And Plan:     1. Essential hypertension  Will add amlodipine   Will also send referral for sleep study which can affect her blood pressure and weight - Ambulatory referral to Sleep Studies - amLODipine (NORVASC) 2.5 MG tablet; Take 1 tablet (2.5 mg total) by mouth daily.  Dispense: 30 tablet; Refill: 2  2. Class 3 severe obesity due to excess calories without serious comorbidity with body mass index (BMI) of 40.0 to 44.9 in adult [facility]) Chronic Discussed healthy diet and regular exercise options  Encouraged to exercise at least 150 minutes per week with 2 days of strength training She has lost 5 lbs since April 29th  Wt Readings from Last 3 Encounters:  06/04/19 290 lb (131.5 kg)  05/07/19 295 lb 9.6 oz (134.1 kg)  03/26/19 280 lb (127 kg)     3. Abnormal glucose  Chronic, tolerating metformin well.  - metFORMIN (GLUCOPHAGE) 500 MG tablet; Take 1 tablet (500 mg total) by mouth 2 (two) times daily with a meal.  Dispense: 30 tablet; Refill: 2  4. Chronic pain of both knees  Discussed the importance of weight loss to help with knee pain  Encouraged to do water exercises  She is to use diclofenac gel to area    [name], FNP    THE PATIENT IS ENCOURAGED TO PRACTICE SOCIAL DISTANCING DUE TO THE COVID-19 PANDEMIC.

## 2019-06-18 ENCOUNTER — Ambulatory Visit
Admission: RE | Admit: 2019-06-18 | Discharge: 2019-06-18 | Disposition: A | Discharge status: Home / Self Care | Payer: 59 | Source: Ambulatory Visit | Attending: Nurse Practitioner | Admitting: Nurse Practitioner

## 2019-06-18 ENCOUNTER — Other Ambulatory Visit: Payer: Self-pay

## 2019-06-18 DIAGNOSIS — Z1231 Encounter for screening mammogram for malignant neoplasm of breast: Secondary | ICD-10-CM

## 2019-07-16 ENCOUNTER — Ambulatory Visit (INDEPENDENT_AMBULATORY_CARE_PROVIDER_SITE_OTHER): Payer: 59 | Admitting: Nurse Practitioner

## 2019-07-16 ENCOUNTER — Encounter: Payer: Self-pay | Admitting: Nurse Practitioner

## 2019-07-16 ENCOUNTER — Other Ambulatory Visit: Payer: Self-pay

## 2019-07-16 VITALS — BP 140/86 | HR 71 | Temp 98.2°F | Ht 65.4 in | Wt 287.0 lb

## 2019-07-16 DIAGNOSIS — G8929 Other chronic pain: Secondary | ICD-10-CM | POA: Diagnosis not present

## 2019-07-16 DIAGNOSIS — M25561 Pain in right knee: Secondary | ICD-10-CM | POA: Diagnosis not present

## 2019-07-16 DIAGNOSIS — I10 Essential (primary) hypertension: Secondary | ICD-10-CM | POA: Diagnosis not present

## 2019-07-16 DIAGNOSIS — M25562 Pain in left knee: Secondary | ICD-10-CM

## 2019-07-16 DIAGNOSIS — M545 Low back pain, unspecified: Secondary | ICD-10-CM

## 2019-07-16 MED ORDER — HYDROCODONE-ACETAMINOPHEN 5-325 MG PO TABS: 1.0000 | ORAL_TABLET | Freq: Four times a day (QID) | ORAL | 0 refills | Status: DC | PRN

## 2019-07-16 MED ORDER — DICLOFENAC SODIUM 1 % EX GEL: 2.0000 g | Freq: Four times a day (QID) | CUTANEOUS | 3 refills | Status: DC

## 2019-07-16 NOTE — Progress Notes (Signed)
This visit occurred during the SARS-CoV-2 public health emergency.  Safety protocols were in place, including screening questions prior to the visit, additional usage of staff PPE, and extensive cleaning of exam room while observing appropriate contact time as indicated for disinfecting solutions.  Subjective:     Patient ID: Alexandra Bell , female    DOB: November 30, 1965 , 54 y.o.   MRN: [id number]   Chief Complaint  Patient presents with  . Hypertension    HPI  Wt Readings from Last 3 Encounters: 07/16/19 : 287 lb (130.2 kg) 06/04/19 : 290 lb (131.5 kg) 05/07/19 : 295 lb 9.6 oz (134.1 kg)   Hypertension This is a chronic problem. The current episode started more than 1 year ago. The problem is uncontrolled. Pertinent negatives include no anxiety, chest pain, headaches or palpitations. Risk factors for coronary artery disease include sedentary lifestyle and obesity. Past treatments include ACE inhibitors and diuretics. There are no compliance problems.  There is no history of angina or kidney disease. There is no history of chronic renal disease.     Past Medical History:  Diagnosis Date  . Acute renal failure ([place])   . Arthritis   . Asthma   . Colitis, eosinophilic 42/05/9561  . Common bile duct stone   . Eosinophilia 01/16/2011  . Eosinophilic enteritis 87/5643  . Fatty liver 11/2010  . GERD (gastroesophageal reflux disease)   . Hx: UTI (urinary tract infection)   . Hypertension   . Inguinal hernia   . Obesity, Class III, BMI 40-49.9 (morbid obesity) ([place])   . Periumbilical hernia      Family History  Problem Relation Age of Onset  . Diabetes Mother   . Colon cancer Father        ?  . Diabetes Sister      Current Outpatient Medications:  .  acetaminophen (TYLENOL) 500 MG tablet, Take 1,500 mg by mouth every 6 (six) hours as needed. For pain, Disp: , Rfl:  .  albuterol (PROVENTIL HFA;VENTOLIN HFA) 108 (90 BASE) MCG/ACT inhaler, Inhale 2 puffs into the lungs  every 6 (six) hours as needed. For asthma, Disp: 1 Inhaler, Rfl: 3 .  amLODipine (NORVASC) 2.5 MG tablet, Take 1 tablet (2.5 mg total) by mouth daily., Disp: 30 tablet, Rfl: 2 .  aspirin EC 81 MG tablet, Take 81 mg by mouth daily as needed. For pain , Disp: , Rfl:  .  gabapentin (NEURONTIN) 300 MG capsule, Take 1 capsule (300 mg total) by mouth 3 (three) times daily., Disp: 240 capsule, Rfl: 1 .  lisinopril-hydrochlorothiazide (ZESTORETIC) 20-25 MG tablet, Take 1 tablet by mouth daily., Disp: 90 tablet, Rfl: 1 .  Magnesium 200 MG TABS, Take 1 tablet by mouth daily with evening meal, Disp: 90 tablet, Rfl: 1 .  metFORMIN (GLUCOPHAGE) 500 MG tablet, Take 1 tablet (500 mg total) by mouth 2 (two) times daily with a meal., Disp: 30 tablet, Rfl: 2 .  traMADol (ULTRAM) 50 MG tablet, Take 1 tablet (50 mg total) by mouth every 6 (six) hours as needed., Disp: 20 tablet, Rfl: 0 .  diclofenac Sodium (VOLTAREN) 1 % GEL, Apply 2 g topically 4 (four) times daily. (Patient not taking: Reported on 07/16/2019), Disp: 100 g, Rfl: 3 .  HYDROcodone-acetaminophen (NORCO/VICODIN) 5-325 MG tablet, Take 1 tablet by mouth every 6 (six) hours as needed for moderate pain. (Patient not taking: Reported on 07/16/2019), Disp: 20 tablet, Rfl: 0   No Known Allergies   Review of Systems  Constitutional:  Negative.  Negative for fatigue.  Respiratory: Negative.   Cardiovascular: Negative.  Negative for chest pain, palpitations and leg swelling.  Neurological: Negative for dizziness and headaches.  Psychiatric/Behavioral: Negative.      Today's Vitals   07/16/19 1053  BP: 140/86  Pulse: 71  Temp: 98.2 F (36.8 C)  TempSrc: Oral  Weight: 287 lb (130.2 kg)  Height: 5' 5.4" (1.661 m)  PainSc: 0-No pain   Body mass index is 47.18 kg/m.   Objective:  Physical Exam Cardiovascular:     Rate and Rhythm: Normal rate and regular rhythm.     Pulses: Normal pulses.     Heart sounds: Normal heart sounds. No murmur heard.    Pulmonary:     Effort: Pulmonary effort is normal. No respiratory distress.     Breath sounds: Normal breath sounds.  Musculoskeletal:        General: Tenderness (bilateral knees) present. No swelling or deformity.  Skin:    General: Skin is warm and dry.     Capillary Refill: Capillary refill takes less than 2 seconds.  Neurological:     General: No focal deficit present.     Mental Status: She is alert and oriented to person, place, and time.     Cranial Nerves: No cranial nerve deficit.  Psychiatric:        Mood and Affect: Mood normal.        Behavior: Behavior normal.        Thought Content: Thought content normal.        Judgment: Judgment normal.         Assessment And Plan:     1. Essential hypertension . B/P is much better controlled.    3. Chronic low back pain without sciatica, unspecified back pain laterality  No current severe pain at this time - diclofenac Sodium (VOLTAREN) 1 % GEL; Apply 2 g topically 4 (four) times daily.  Dispense: 350 g; Refill: 3  4. Arthralgia of both knees  Continue with pain gel as needed - diclofenac Sodium (VOLTAREN) 1 % GEL; Apply 2 g topically 4 (four) times daily.  Dispense: 350 g; Refill: 3  5. Chronic pain of both knees  Continue follow up with orthopedic  If she continues to require narcotic pain medications will consider referral to pain clinic  PDMP reviewed during this encounter. - HYDROcodone-acetaminophen (NORCO/VICODIN) 5-325 MG tablet; Take 1 tablet by mouth every 6 (six) hours as needed for moderate pain.  Dispense: 20 tablet; Refill: 0      [name], FNP    THE PATIENT IS ENCOURAGED TO PRACTICE SOCIAL DISTANCING DUE TO THE COVID-19 PANDEMIC.

## 2019-08-31 ENCOUNTER — Other Ambulatory Visit: Payer: Self-pay | Admitting: Nurse Practitioner

## 2019-08-31 DIAGNOSIS — R7309 Other abnormal glucose: Secondary | ICD-10-CM

## 2019-10-12 ENCOUNTER — Other Ambulatory Visit: Payer: Self-pay | Admitting: Nurse Practitioner

## 2019-10-12 DIAGNOSIS — R7309 Other abnormal glucose: Secondary | ICD-10-CM

## 2019-10-22 ENCOUNTER — Encounter: Payer: Self-pay | Admitting: Nurse Practitioner

## 2019-10-22 ENCOUNTER — Other Ambulatory Visit: Payer: Self-pay

## 2019-10-22 ENCOUNTER — Ambulatory Visit (INDEPENDENT_AMBULATORY_CARE_PROVIDER_SITE_OTHER): Payer: 59 | Admitting: Nurse Practitioner

## 2019-10-22 VITALS — BP 132/80 | HR 63 | Temp 98.6°F | Ht 64.6 in | Wt 291.6 lb

## 2019-10-22 DIAGNOSIS — Z1159 Encounter for screening for other viral diseases: Secondary | ICD-10-CM | POA: Diagnosis not present

## 2019-10-22 DIAGNOSIS — R600 Localized edema: Secondary | ICD-10-CM

## 2019-10-22 DIAGNOSIS — I10 Essential (primary) hypertension: Secondary | ICD-10-CM

## 2019-10-22 DIAGNOSIS — Z23 Encounter for immunization: Secondary | ICD-10-CM | POA: Diagnosis not present

## 2019-10-22 DIAGNOSIS — Z113 Encounter for screening for infections with a predominantly sexual mode of transmission: Secondary | ICD-10-CM

## 2019-10-22 DIAGNOSIS — R7309 Other abnormal glucose: Secondary | ICD-10-CM

## 2019-10-22 DIAGNOSIS — R011 Cardiac murmur, unspecified: Secondary | ICD-10-CM

## 2019-10-22 MED ORDER — METFORMIN HCL 500 MG PO TABS: 500.0000 mg | ORAL_TABLET | Freq: Two times a day (BID) | ORAL | 1 refills | Status: DC

## 2019-10-22 NOTE — Patient Instructions (Addendum)

## 2019-10-22 NOTE — Progress Notes (Signed)
I,Yamilka Roman [employer] as a Education administrator for [employer], FNP.,have documented all relevant documentation on the behalf of [name], FNP,as directed by  [name], FNP while in the presence of [name], [place]. This visit occurred during the SARS-CoV-2 public health emergency.  Safety protocols were in place, including screening questions prior to the visit, additional usage of staff PPE, and extensive cleaning of exam room while observing appropriate contact time as indicated for disinfecting solutions.  Subjective:     Patient ID: Alexandra Bell , female    DOB: Jul 09, 1965 , 54 y.o.   MRN: [id number]   Chief Complaint  Patient presents with  . Hypertension    HPI  She has not been to see the sleep provider - due to not wanting to sleep as much at night, she uses that time for herself.  She is not interested in having a sleep study.   She is down to 6 cigarettes a day was 10.    She has seen physical therapy since her last visit. She was having muscle spasms in her back and the orthopedic provider gave her medications (steroid dose pack and a muscle relaxer - unsure of the name)  She has never seen a heart specialist.    Hypertension This is a chronic problem. The current episode started more than 1 year ago. The problem is uncontrolled. Pertinent negatives include no anxiety, chest pain, headaches or palpitations. Risk factors for coronary artery disease include sedentary lifestyle and obesity. Past treatments include ACE inhibitors and diuretics. There are no compliance problems.  There is no history of angina or kidney disease. There is no history of chronic renal disease.     Past Medical History:  Diagnosis Date  . Acute renal failure ([place])   . Arthritis   . Asthma   . Colitis, eosinophilic 16/09/6787  . Common bile duct stone   . Eosinophilia 01/16/2011  . Eosinophilic enteritis 38/1017  . Fatty liver 11/2010  . GERD (gastroesophageal reflux disease)   . Hx:  UTI (urinary tract infection)   . Hypertension   . Inguinal hernia   . Obesity, Class III, BMI 40-49.9 (morbid obesity) ([place])   . Periumbilical hernia      Family History  Problem Relation Age of Onset  . Diabetes Mother   . Colon cancer Father        ?  . Diabetes Sister      Current Outpatient Medications:  .  acetaminophen (TYLENOL) 500 MG tablet, Take 1,500 mg by mouth every 6 (six) hours as needed. For pain, Disp: , Rfl:  .  albuterol (PROVENTIL HFA;VENTOLIN HFA) 108 (90 BASE) MCG/ACT inhaler, Inhale 2 puffs into the lungs every 6 (six) hours as needed. For asthma, Disp: 1 Inhaler, Rfl: 3 .  amLODipine (NORVASC) 2.5 MG tablet, Take 1 tablet (2.5 mg total) by mouth daily., Disp: 30 tablet, Rfl: 2 .  aspirin EC 81 MG tablet, Take 81 mg by mouth daily as needed. For pain , Disp: , Rfl:  .  diclofenac Sodium (VOLTAREN) 1 % GEL, Apply 2 g topically 4 (four) times daily., Disp: 350 g, Rfl: 3 .  gabapentin (NEURONTIN) 300 MG capsule, Take 1 capsule (300 mg total) by mouth 3 (three) times daily., Disp: 240 capsule, Rfl: 1 .  lisinopril-hydrochlorothiazide (ZESTORETIC) 20-25 MG tablet, Take 1 tablet by mouth daily., Disp: 90 tablet, Rfl: 1 .  Magnesium 200 MG TABS, Take 1 tablet by mouth daily with evening meal, Disp: 90 tablet,  Rfl: 1 .  metFORMIN (GLUCOPHAGE) 500 MG tablet, Take 1 tablet (500 mg total) by mouth 2 (two) times daily with a meal., Disp: 180 tablet, Rfl: 1   No Known Allergies   Review of Systems  Constitutional: Negative.   Eyes: Negative.   Respiratory: Negative.   Cardiovascular: Negative for chest pain, palpitations and leg swelling.  Gastrointestinal: Negative.   Genitourinary: Negative.   Musculoskeletal: Negative.        Lower extremity edema   Skin: Negative.   Neurological: Negative for headaches.  Psychiatric/Behavioral: Negative.      Today's Vitals   10/22/19 0949  BP: 132/80  Pulse: 63  Temp: 98.6 F (37 C)  TempSrc: Oral  Weight: 291 lb 9.6  oz (132.3 kg)  Height: 5' 4.6" (1.641 m)  PainSc: 0-No pain   Body mass index is 49.13 kg/m.   Objective:  Physical Exam Vitals reviewed.  Constitutional:      General: She is not in acute distress.    Appearance: Normal appearance. She is well-developed. She is obese.  Eyes:     Pupils: Pupils are equal, round, and reactive to light.  Cardiovascular:     Rate and Rhythm: Normal rate and regular rhythm.     Pulses: Normal pulses.     Heart sounds: Murmur heard.   Pulmonary:     Effort: Pulmonary effort is normal. No respiratory distress.     Breath sounds: Normal breath sounds. No wheezing.  Musculoskeletal:        General: Normal range of motion.     Right lower leg: Edema (1+) present.     Left lower leg: Edema (1+) present.  Skin:    General: Skin is warm and dry.     Capillary Refill: Capillary refill takes less than 2 seconds.  Neurological:     General: No focal deficit present.     Mental Status: She is alert and oriented to person, place, and time.     Cranial Nerves: No cranial nerve deficit.  Psychiatric:        Mood and Affect: Mood normal.        Behavior: Behavior normal.        Thought Content: Thought content normal.        Judgment: Judgment normal.         Assessment And Plan:     1. Essential hypertension Chronic, fair control She is tolerating amlodipine well and her blood pressure is better - BMP8+eGFR  2. Need for influenza vaccination  Influenza vaccine administered  Encouraged to take Tylenol as needed for fever or muscle aches. - Flu Vaccine QUAD 6+ mos PF IM (Fluarix Quad PF)  3. Encounter for hepatitis C screening test for low risk patient  Will check Hepatitis C screening due to recent recommendations to screen all adults 18 years and older - Hepatitis C antibody  4. Lower leg edema  I have encouraged her to wear support socks during the day and keep elevated  She has intermittent shortness of breath so I will also check a  BNP - Brain natriuretic peptide - Ambulatory referral to Cardiology  5. Murmur  Patient reports a history of a murmur however I do not recall hearing this at her last visit, I will refer her to cardiology for further evaluation  Her previous EKG revealed left atrial enlargement - Brain natriuretic peptide - Ambulatory referral to Cardiology  6. Abnormal glucose  She is tolerating metformin well  Will check HgbA1c  Encouraged to continue with healthy diet and exercise as tolerated - Hemoglobin A1c - BMP8+eGFR - metFORMIN (GLUCOPHAGE) 500 MG tablet; Take 1 tablet (500 mg total) by mouth 2 (two) times daily with a meal.  Dispense: 180 tablet; Refill: 1  7. Screening for STD (sexually transmitted disease) - HIV Antibody (routine testing w rflx)    Patient was given opportunity to ask questions. Patient verbalized understanding of the plan and was able to repeat key elements of the plan. All questions were answered to their satisfaction.   Teola Bradley, FNP, have reviewed all documentation for this visit. The documentation on 10/22/19 for the exam, diagnosis, procedures, and orders are all accurate and complete.  THE PATIENT IS ENCOURAGED TO PRACTICE SOCIAL DISTANCING DUE TO THE COVID-19 PANDEMIC.

## 2019-10-23 LAB — BMP8+EGFR
BUN/Creatinine Ratio: 15 (ref 9–23)
BUN: 11 mg/dL (ref 6–24)
CO2: 24 mmol/L (ref 20–29)
Calcium: 10.3 mg/dL — ABNORMAL HIGH (ref 8.7–10.2)
Chloride: 102 mmol/L (ref 96–106)
Creatinine, Ser: 0.72 mg/dL (ref 0.57–1.00)
GFR calc Af Amer: 109 mL/min/{1.73_m2} (ref >59)
GFR calc non Af Amer: 95 mL/min/{1.73_m2} (ref >59)
Glucose: 85 mg/dL (ref 65–99)
Potassium: 4.5 mmol/L (ref 3.5–5.2)
Sodium: 140 mmol/L (ref 134–144)

## 2019-10-23 LAB — HEMOGLOBIN A1C
Est. average glucose Bld gHb Est-mCnc: 123 mg/dL
Hgb A1c MFr Bld: 5.9 % — ABNORMAL HIGH (ref 4.8–5.6)

## 2019-10-23 LAB — HEPATITIS C ANTIBODY: Hep C Virus Ab: <0.1 s/co ratio (ref 0.0–0.9)

## 2019-10-23 LAB — BRAIN NATRIURETIC PEPTIDE: BNP: 541.2 pg/mL — ABNORMAL HIGH (ref 0.0–100.0)

## 2019-10-23 LAB — HIV ANTIBODY (ROUTINE TESTING W REFLEX): HIV Screen 4th Generation wRfx: NONREACTIVE

## 2019-10-26 ENCOUNTER — Other Ambulatory Visit: Payer: Self-pay | Admitting: Nurse Practitioner

## 2019-10-26 DIAGNOSIS — R600 Localized edema: Secondary | ICD-10-CM

## 2019-10-26 DIAGNOSIS — R7309 Other abnormal glucose: Secondary | ICD-10-CM

## 2019-10-26 DIAGNOSIS — R7989 Other specified abnormal findings of blood chemistry: Secondary | ICD-10-CM

## 2019-10-26 MED ORDER — DAPAGLIFLOZIN PROPANEDIOL 5 MG PO TABS: 5.0000 mg | ORAL_TABLET | Freq: Every day | ORAL | 3 refills | Status: DC

## 2019-10-29 ENCOUNTER — Other Ambulatory Visit: Payer: Self-pay | Admitting: Nurse Practitioner

## 2019-10-29 ENCOUNTER — Other Ambulatory Visit: Payer: Self-pay

## 2019-10-29 DIAGNOSIS — G8929 Other chronic pain: Secondary | ICD-10-CM

## 2019-10-29 DIAGNOSIS — M545 Low back pain, unspecified: Secondary | ICD-10-CM

## 2019-10-29 DIAGNOSIS — M79641 Pain in right hand: Secondary | ICD-10-CM

## 2019-10-29 DIAGNOSIS — M79642 Pain in left hand: Secondary | ICD-10-CM

## 2019-10-29 MED ORDER — FUROSEMIDE 20 MG PO TABS: ORAL_TABLET | ORAL | 2 refills | Status: DC

## 2019-10-29 MED ORDER — GABAPENTIN 300 MG PO CAPS: 300.0000 mg | ORAL_CAPSULE | Freq: Three times a day (TID) | ORAL | 1 refills | Status: DC

## 2019-11-12 ENCOUNTER — Other Ambulatory Visit: Payer: Self-pay | Admitting: Nurse Practitioner

## 2019-11-12 DIAGNOSIS — R7309 Other abnormal glucose: Secondary | ICD-10-CM

## 2019-11-26 ENCOUNTER — Other Ambulatory Visit: Payer: Self-pay

## 2019-11-26 ENCOUNTER — Ambulatory Visit (HOSPITAL_COMMUNITY): Payer: 59 | Attending: Cardiovascular Disease

## 2019-11-26 DIAGNOSIS — R7989 Other specified abnormal findings of blood chemistry: Secondary | ICD-10-CM | POA: Diagnosis not present

## 2019-11-26 DIAGNOSIS — R600 Localized edema: Secondary | ICD-10-CM | POA: Diagnosis not present

## 2019-11-26 LAB — ECHOCARDIOGRAM COMPLETE
Area-P 1/2: 2.14 cm2
S' Lateral: 1.4 cm

## 2019-11-26 MED ORDER — PERFLUTREN LIPID MICROSPHERE
1.0000 mL | INTRAVENOUS | Status: AC | PRN
  Administered 2019-11-26: 3 mL via INTRAVENOUS

## 2019-12-10 ENCOUNTER — Ambulatory Visit (INDEPENDENT_AMBULATORY_CARE_PROVIDER_SITE_OTHER): Payer: 59 | Admitting: Cardiovascular Disease

## 2019-12-10 ENCOUNTER — Encounter: Payer: Self-pay | Admitting: Cardiovascular Disease

## 2019-12-10 ENCOUNTER — Other Ambulatory Visit: Payer: Self-pay

## 2019-12-10 ENCOUNTER — Telehealth: Payer: Self-pay | Admitting: Radiology

## 2019-12-10 VITALS — BP 158/72 | HR 74 | Temp 97.0°F | Ht 66.0 in | Wt 291.2 lb

## 2019-12-10 DIAGNOSIS — Z5181 Encounter for therapeutic drug level monitoring: Secondary | ICD-10-CM

## 2019-12-10 DIAGNOSIS — Z01812 Encounter for preprocedural laboratory examination: Secondary | ICD-10-CM | POA: Diagnosis not present

## 2019-12-10 DIAGNOSIS — I421 Obstructive hypertrophic cardiomyopathy: Secondary | ICD-10-CM | POA: Diagnosis not present

## 2019-12-10 DIAGNOSIS — I493 Ventricular premature depolarization: Secondary | ICD-10-CM

## 2019-12-10 DIAGNOSIS — I10 Essential (primary) hypertension: Secondary | ICD-10-CM | POA: Diagnosis not present

## 2019-12-10 HISTORY — DX: Obstructive hypertrophic cardiomyopathy: I42.1

## 2019-12-10 HISTORY — DX: Morbid (severe) obesity due to excess calories: E66.01

## 2019-12-10 MED ORDER — AMLODIPINE BESYLATE 5 MG PO TABS: 5.0000 mg | ORAL_TABLET | Freq: Every day | ORAL | 1 refills | Status: DC

## 2019-12-10 MED ORDER — METOPROLOL TARTRATE 50 MG PO TABS: 50.0000 mg | ORAL_TABLET | Freq: Two times a day (BID) | ORAL | 1 refills | Status: DC

## 2019-12-10 MED ORDER — LISINOPRIL 20 MG PO TABS: 20.0000 mg | ORAL_TABLET | Freq: Every day | ORAL | 1 refills | Status: DC

## 2019-12-10 MED ORDER — FUROSEMIDE 40 MG PO TABS
40.0000 mg | ORAL_TABLET | Freq: Every day | ORAL | 1 refills | Status: DC
Start: 2019-12-10 — End: 2020-03-10

## 2019-12-10 NOTE — Progress Notes (Signed)
Cardiology Office Note   Date:  12/10/2019   ID:  Alexandra Bell, DOB 25-May-1965, MRN [id number]  PCP:  [name], FNP  Cardiologist:   Skeet Latch, MD   No chief complaint on file.    History of Present Illness: Alexandra Bell is a 54 y.o. female with hypertension, asthma, GERD, and obesity who is being seen today for the evaluation of lower extremity edema at the request of [name], [place].  She was noted to have a heart murmur on exam.  She reports that she has had a murmur for many years.  BNP at that time was 451.  She had an echo 11/2019 that revealed LVEF 65 to 70% with severe LVH and grade 2 diastolic dysfunction.  Overall she has felt pretty well.  She denies any chest pain or shortness of breath.  A year and a half ago her husband passed away and she is struggled since then.  She has heart pain that she attributes to this.  She denies any exertional chest pain or pressure.  She has noted increased lower extremity edema but denies any orthopnea or PND.  Her sleep pattern is off but she does not think she has sleep apnea.  She has not been interested in getting a sleep study.  She denies any palpitations, syncope, or near syncope.  She has no family history of sudden cardiac death.  Lately her blood pressure has been more difficult to control.  This started after her husband's death.  Most recently it has been in the 130s over 40s.  She does not get much exercise.  She is in the process of getting an exercise bike and hopes to use this.  She has a treadmill but cannot use it due to chronic knee pain.  She has been smoking cigarettes for about 40 years.  She was able to quit in the past but started back after her husband's death.  She does not drink alcohol and limits her caffeine.  Past Medical History:  Diagnosis Date  . Acute renal failure ([place])   . Arthritis   . Asthma   . Colitis, eosinophilic 25/08/5275  . Common bile duct stone   . Eosinophilia  01/16/2011  . Eosinophilic enteritis 82/4235  . Fatty liver 11/2010  . GERD (gastroesophageal reflux disease)   . HOCM (hypertrophic obstructive cardiomyopathy) ([place]) 12/10/2019  . Hx: UTI (urinary tract infection)   . Hypertension   . Inguinal hernia   . Morbid obesity ([place]) 12/10/2019  . Obesity, Class III, BMI 40-49.9 (morbid obesity) ([place])   . Periumbilical hernia     Past Surgical History:  Procedure Laterality Date  . ABDOMINAL HYSTERECTOMY    . BRONCHOSCOPY, LYMPH NODE BX  MAY 2011  . CHOLECYSTECTOMY  2001  . ERCP/SPHINCTEROTOMY/STONE EXTRACTION  2001  . FLEXIBLE SIGMOIDOSCOPY  11/29/2010   Procedure: FLEXIBLE SIGMOIDOSCOPY;  Surgeon: Lafayette Dragon, MD;  Location: [facility] ENDOSCOPY;  Service: Endoscopy;  Laterality: N/A;     Current Outpatient Medications  Medication Sig Dispense Refill  . acetaminophen (TYLENOL) 500 MG tablet Take 1,500 mg by mouth every 6 (six) hours as needed. For pain    . albuterol (PROVENTIL HFA;VENTOLIN HFA) 108 (90 BASE) MCG/ACT inhaler Inhale 2 puffs into the lungs every 6 (six) hours as needed. For asthma 1 Inhaler 3  . amLODipine (NORVASC) 5 MG tablet Take 1 tablet (5 mg total) by mouth daily. 90 tablet 1  . aspirin EC 81 MG tablet Take  81 mg by mouth daily as needed. For pain     . diclofenac Sodium (VOLTAREN) 1 % GEL Apply 2 g topically 4 (four) times daily. 350 g 3  . furosemide (LASIX) 40 MG tablet Take 1 tablet (40 mg total) by mouth daily. 90 tablet 1  . gabapentin (NEURONTIN) 300 MG capsule Take 1 capsule (300 mg total) by mouth 3 (three) times daily. 240 capsule 1  . Magnesium 200 MG TABS Take 1 tablet by mouth daily with evening meal 90 tablet 1  . metFORMIN (GLUCOPHAGE) 500 MG tablet TAKE 1 TABLET BY MOUTH TWICE DAILY WITH A MEAL 30 tablet 0  . lisinopril (ZESTRIL) 20 MG tablet Take 1 tablet (20 mg total) by mouth daily. 90 tablet 1  . metoprolol tartrate (LOPRESSOR) 50 MG tablet Take 1 tablet (50 mg total) by mouth 2 (two) times daily. 180  tablet 1   No current facility-administered medications for this visit.    Allergies:   Patient has no known allergies.    Social History:  The patient  reports that she has been smoking cigarettes. She has a 15.00 pack-year smoking history. She has never used smokeless tobacco. She reports that she does not drink alcohol and does not use drugs.   Family History:  The patient's family history includes Colon cancer in her father; Diabetes in her mother and sister; Heart attack in her father.    ROS:  Please see the history of present illness.   Otherwise, review of systems are positive for none.   All other systems are reviewed and negative.    PHYSICAL EXAM: VS:  BP (!) 158/72   Pulse 74   Temp (!) 97 F (36.1 C)   Ht 5\' 6"  (1.676 m)   Wt 291 lb 3.2 oz (132.1 kg)   SpO2 96%   BMI 47.00 kg/m  , BMI Body mass index is 47 kg/m. GENERAL:  Well appearing HEENT:  Pupils equal round and reactive, fundi not visualized, oral mucosa unremarkable NECK:  + jugular venous distention to 2 cm above the clavicle at 45 degrees, waveform within normal limits, carotid upstroke brisk and symmetric, no bruits, no thyromegaly LYMPHATICS:  No cervical adenopathy LUNGS:  Clear to auscultation bilaterally HEART:  RRR.  PMI not displaced or sustained,S1 and S2 within normal limits, no S3, no S4, no clicks, no rubs, III/VI systolic murmurs at the LUSB.  Increases with Valsalva and hand grip. ABD:  Flat, positive bowel sounds normal in frequency in pitch, no bruits, no rebound, no guarding, no midline pulsatile mass, no hepatomegaly, no splenomegaly EXT:  2 plus pulses throughout, 1+ LE edema to the upper tibia bilaterally, no cyanosis no clubbing SKIN:  No rashes no nodules NEURO:  Cranial nerves II through XII grossly intact, motor grossly intact throughout PSYCH:  Cognitively intact, oriented to person place and time  EKG:  EKG is ordered today. The ekg ordered today demonstrates sinus rhythm.  Rate  74 bpm.  LVH.  Ventricular fusion complexes.    Echo 11/2019: 1. Left ventricular ejection fraction, by estimation, is 65 to 70%. The  left ventricle has normal function. The left ventricle has no regional  wall motion abnormalities. There is severe concentric left ventricular  hypertrophy. Left ventricular diastolic  parameters are consistent with Grade II diastolic dysfunction  (pseudonormalization).  2. Right ventricular systolic function is normal. The right ventricular  size is normal.  3. Left atrial size was moderately dilated.  4. Right atrial size was  moderately dilated.  5. The mitral valve is normal in structure. No evidence of mitral valve  regurgitation.  6. The aortic valve is normal in structure. Aortic valve regurgitation is  not visualized.   FINDINGS  Left Ventricle: Left ventricular ejection fraction, by estimation, is 65  to 70%. The left ventricle has normal function. The left ventricle has no  regional wall motion abnormalities. Definity contrast agent was given IV  to delineate the left ventricular  endocardial borders. The left ventricular internal cavity size was small.  There is severe concentric left ventricular hypertrophy. Left ventricular  diastolic parameters are consistent with Grade II diastolic dysfunction  (pseudonormalization).   Recent Labs: 05/07/2019: ALT 11; Hemoglobin 14.5; Platelets 270 10/22/2019: BNP 541.2; BUN 11; Creatinine, Ser 0.72; Potassium 4.5; Sodium 140    Lipid Panel    Component Value Date/Time   CHOL 188 05/07/2019 1542   TRIG 123 05/07/2019 1542   HDL 61 05/07/2019 1542   CHOLHDL 3.1 05/07/2019 1542   LDLCALC 105 (H) 05/07/2019 1542      Wt Readings from Last 3 Encounters:  12/10/19 291 lb 3.2 oz (132.1 kg)  10/22/19 291 lb 9.6 oz (132.3 kg)  07/16/19 287 lb (130.2 kg)      ASSESSMENT AND PLAN:  # Shortness of breath:  # Severe LVH:  # HOCM:  # Hypertension:  Ms. Malatesta has severe LVH and HOCM.   There was in intracavitary gradient.  She has no family history of sudden cardiac death.  She did have PVCs on her EKG today.  We will get a 7-day monitor to assess for NSVT.  After that she will start metoprolol 50 mg twice daily.  We will also get a cardiac MRI to assess for scar or infiltration.  It is unclear how much of this is hypertrophic cardiomyopathy versus poorly controlled hypertension.  Her volume status has improved on Lasix.  However we do not want her on both Lasix and hydrochlorothiazide.  We will discontinue the hydrochlorothiazide.  Increase Lasix to 40 mg daily.  Increase amlodipine to 5 mg and continue lisinopril 20 mg.  She is unable to walk on a treadmill for an ETT.  # Tobacco abuse:   Encourage smoking cessation.  She is not ready to stop at this time.  # Morbid obesity:  Encouraged increase exercise to 150 minutes weekly.   Current medicines are reviewed at length with the patient today.  The patient does not have concerns regarding medicines.  The following changes have been made:  Increase amlodipine.  Stop HCTZ.  Increase furosemide.   Labs/ tests ordered today include:   Orders Placed This Encounter  Procedures  . MR Card Morphology Wo/W Cm  . Basic metabolic panel  . CARDIAC EVENT MONITOR  . EKG 12-Lead     Disposition:   FU with APP in 1 month.  Montrail Mehrer C. Oval Linsey, MD, [facility] in 3 months.     Signed, Jannelle Notaro C. Oval Linsey, MD, [facility]  12/10/2019 2:24 PM    [place]

## 2019-12-10 NOTE — Patient Instructions (Signed)
Medication Instructions:  STOP LISINOPRIL HCT COMBO  START LISINOPRIL 20 MG DAILY   INCREASE AMLODIPINE TO 5 MG DAILY   INCREASE FUROSEMIDE TO 40 MG DAILY   START METOPROLOL 50 MG TWICE A DAY, DO NOT START UNTIL YOU HAVE FINISHED YOUR 7 DAY MONITOR   *If you need a refill on your cardiac medications before your next appointment, please call your pharmacy*  Lab Work: BMET 1 WEEK PRIOR TO CARDIAC MRI   If you have labs (blood work) drawn today and your tests are completely normal, you will receive your results only by: Marland Kitchen MyChart Message (if you have MyChart) OR . A paper copy in the mail If you have any lab test that is abnormal or we need to change your treatment, we will call you to review the results.  Testing/Procedures: Your physician has requested that you have a cardiac MRI. Cardiac MRI uses a computer to create images of your heart as its beating, producing both still and moving pictures of your heart and major blood vessels. For further information please visit [email or web address] Please follow the instruction sheet given to you today for more information.  Your physician has recommended that you wear an event monitor. Event monitors are medical devices that record the heart's electrical activity. Doctors most often [place] these monitors to diagnose arrhythmias. Arrhythmias are problems with the speed or rhythm of the heartbeat. The monitor is a small, portable device. You can wear one while you do your normal daily activities. This is usually used to diagnose what is causing palpitations/syncope (passing out). 7 DAY   Follow-Up: At [facility], you and your health needs are our priority.  As part of our continuing mission to provide you with exceptional heart care, we have created designated Provider Care Teams.  These Care Teams include your primary Cardiologist (physician) and Advanced Practice Providers (APPs -  Physician Assistants and Nurse Practitioners) who all work together  to provide you with the care you need, when you need it.  We recommend signing up for the patient portal called "MyChart".  Sign up information is provided on this After Visit Summary.  MyChart is used to connect with patients for Virtual Visits (Telemedicine).  Patients are able to view lab/test results, encounter notes, upcoming appointments, etc.  Non-urgent messages can be sent to your provider as well.   To learn more about what you can do with MyChart, go to [email or web address].    Your next appointment:   1 MONTH WITH APP ON A DAY DR [place] IS IN THE OFFICE, IN PERSON   3 MONTHS WITH DR [place], IN PERSON   Other Instructions  [employer] Cardiac Event Monitor Instructions Your physician has requested you wear your cardiac event monitor for __7__ days, (1-30). [employer] may call or text to confirm a shipping address. The monitor will be sent to a land address via UPS. [employer] will not ship a monitor to a PO BOX. It typically takes 3-5 days to receive your monitor after it has been enrolled. [employer] will assist with USPS tracking if your package is delayed. The telephone number for [employer] is [phone]. Once you have received your monitor, please review the enclosed instructions. Instruction tutorials can also be viewed under help and settings on the enclosed cell phone. Your monitor has already been registered assigning a specific monitor serial # to you.  Applying the monitor Remove cell phone from case and turn it on. The cell phone works as Dealer and needs to be  within 10 feet of you at all times. The cell phone will need to be charged on a daily basis. We recommend you plug the cell phone into the enclosed charger at your bedside table every night.  Monitor batteries: You will receive two monitor batteries labelled #1 and #2. These are your recorders. Plug battery #2 onto the second connection on the enclosed charger. Keep one battery on the  charger at all times. This will keep the monitor battery deactivated. It will also keep it fully charged for when you need to switch your monitor batteries. A small light will be blinking on the battery emblem when it is charging. The light on the battery emblem will remain on when the battery is fully charged.  Open package of a Monitor strip. Insert battery #1 into black hood on strip and gently squeeze monitor battery onto connection as indicated in instruction booklet. Set aside while preparing skin.  Choose location for your strip, vertical or horizontal, as indicated in the instruction booklet. Shave to remove all hair from location. There cannot be any lotions, oils, powders, or colognes on skin where monitor is to be applied. Wipe skin clean with enclosed Saline wipe. Dry skin completely.  Peel paper labeled #1 off the back of the Monitor strip exposing the adhesive. Place the monitor on the chest in the vertical or horizontal position shown in the instruction booklet. One arrow on the monitor strip must be pointing upward. Carefully remove paper labeled #2, attaching remainder of strip to your skin. Try not to create any folds or wrinkles in the strip as you apply it.  Firmly press and release the circle in the center of the monitor battery. You will hear a small beep. This is turning the monitor battery on. The heart emblem on the monitor battery will light up every 5 seconds if the monitor battery in turned on and connected to the patient securely. Do not push and hold the circle down as this turns the monitor battery off. The cell phone will locate the monitor battery. A screen will appear on the cell phone checking the connection of your monitor strip. This may read poor connection initially but change to good connection within the next minute. Once your monitor accepts the connection you will hear a series of 3 beeps followed by a climbing crescendo of beeps. A screen will appear  on the cell phone showing the two monitor strip placement options. Touch the picture that demonstrates where you applied the monitor strip.  Your monitor strip and battery are waterproof. You are able to shower, bathe, or swim with the monitor on. They just ask you do not submerge deeper than 3 feet underwater. We recommend removing the monitor if you are swimming in a lake, river, or ocean.  Your monitor battery will need to be switched to a fully charged monitor battery approximately once a week. The cell phone will alert you of an action which needs to be made.  On the cell phone, tap for details to reveal connection status, monitor battery status, and cell phone battery status. The green dots indicates your monitor is in good status. A red dot indicates there is something that needs your attention.  To record a symptom, click the circle on the monitor battery. In 30-60 seconds a list of symptoms will appear on the cell phone. Select your symptom and tap save. Your monitor will record a sustained or significant arrhythmia regardless of you clicking the button. Some patients  do not feel the heart rhythm irregularities. [employer] will notify us of any serious or critical events.  Refer to instruction booklet for instructions on switching batteries, changing strips, the Do not disturb or Pause features, or any additional questions.  Call [employer] at [phone], to confirm your monitor is transmitting and record your baseline. They will answer any questions you may have regarding the monitor instructions at that time.  Returning the monitor to [place] all equipment back into blue box. Peel off strip of paper to expose adhesive and close box securely. There is a prepaid UPS shipping label on this box. Drop in a UPS drop box, or at a UPS facility like [employer]. You may also contact [employer] to arrange UPS to pick up monitor package at your home.

## 2019-12-10 NOTE — Telephone Encounter (Signed)
Enrolled patient for a 7 day [employer] Event monitor to be mailed to patients home  °

## 2019-12-11 ENCOUNTER — Telehealth: Payer: Self-pay | Admitting: Cardiovascular Disease

## 2019-12-11 NOTE — Telephone Encounter (Signed)
Left message for patient to call and discuss scheduling the Cardiac MRI ordered by Dr. Oval Linsey

## 2019-12-14 NOTE — Telephone Encounter (Signed)
Left message for patient to call and discuss scheduling the Cardiac MRI ordered by Dr. Oval Linsey

## 2019-12-15 ENCOUNTER — Other Ambulatory Visit: Payer: Self-pay | Admitting: Internal Medicine

## 2019-12-15 DIAGNOSIS — R7309 Other abnormal glucose: Secondary | ICD-10-CM

## 2019-12-17 ENCOUNTER — Encounter: Payer: Self-pay | Admitting: Cardiovascular Disease

## 2019-12-17 NOTE — Telephone Encounter (Signed)
Left message for patient regarding scheduled appointment for the Cardiac MRI ordered by Dr. Donivan Scull Tuesday 01/19/20 at 12:00pm at [facility]---arrival time is 11:30 am 1st floor admissions office---will mail information to patient and requested she call with questions or concerns.

## 2019-12-21 ENCOUNTER — Ambulatory Visit (INDEPENDENT_AMBULATORY_CARE_PROVIDER_SITE_OTHER): Payer: 59

## 2019-12-21 DIAGNOSIS — I421 Obstructive hypertrophic cardiomyopathy: Secondary | ICD-10-CM

## 2019-12-21 DIAGNOSIS — I493 Ventricular premature depolarization: Secondary | ICD-10-CM

## 2019-12-25 ENCOUNTER — Telehealth: Payer: Self-pay

## 2019-12-25 NOTE — Telephone Encounter (Signed)
-----   Message from [name], [place] sent at 12/23/2019  5:56 PM EST ----- Call to confirm smoking history we may need to order a low dose ct scan  ----- Message ----- From: Skeet Latch, MD Sent: 12/10/2019   2:24 PM EST To: [name], FNP

## 2019-12-25 NOTE — Telephone Encounter (Signed)
I left pt vm to call the office YL,RMA 

## 2019-12-27 ENCOUNTER — Telehealth: Payer: Self-pay | Admitting: Medical

## 2019-12-27 NOTE — Telephone Encounter (Signed)
[facility] called to report critical EKG. Today @ 1:50 PM underlying SR> 10 beats of Vtach heart rate 227, with couplet PACs, single PACs. Patient did not trigger event. Requested report be faxed.   I called the patient twice with no answer and left a message to call back to further discuss heart monitor. Case discussed with Dr. Quentin Ore, will try and get patient in to seem him. Will send to Dr. Oval Linsey for any further recommendations. Patient has follow-up 01/14/19 with Coletta Memos, NP  Monicia Tse Kathlen Mody, PA-C

## 2019-12-28 ENCOUNTER — Telehealth: Payer: Self-pay

## 2019-12-28 NOTE — Telephone Encounter (Signed)
OK thank you.  Continue metoprolol.

## 2019-12-28 NOTE — Telephone Encounter (Signed)
Attempted to call patient in regards to receiving critical heart monitor results in to triage from [place]. Left Message for patient to return call to office when able.   Per Monitor several runs of Afib RVR with rates up to 110. Sinus Rhythm with 10 beat run of Vtach with atrial run with couplet PVC's/PAC's. These events were auto triggered not patient triggered.   DOD (Dr. Audie Box) reviewed strips. Will forward to Dr. Oval Linsey.

## 2019-12-29 NOTE — Telephone Encounter (Signed)
Left message to call back  

## 2019-12-30 NOTE — Telephone Encounter (Signed)
Left message to call back, need to confirm patient started Metoprolol

## 2019-12-30 NOTE — Telephone Encounter (Signed)
Alexandra Bell is returning Alexandra Bell's call. Please advise.

## 2019-12-31 ENCOUNTER — Telehealth: Payer: Self-pay | Admitting: Cardiovascular Disease

## 2019-12-31 NOTE — Telephone Encounter (Signed)
The patient has been made aware to keep taking her Metoprolol and to keep her appointment on 01/14/20. If she becomes symptomatic, developing shortness of breath or chest pain she will proceed to the ED.  She will also call back if she needs anything further or has any questions.

## 2019-12-31 NOTE — Telephone Encounter (Signed)
Patient was given metoprolol to start after her monitor. She should go ahead and start it.

## 2019-12-31 NOTE — Telephone Encounter (Signed)
Follow Up:     Pt is returning Michelle's call from yesterday. She was told to ask for Triage.

## 2019-12-31 NOTE — Telephone Encounter (Signed)
Returned the call to the patient. She was calling back in reference to [employer]'s message from 12/17/19. The patient is asymptomatic and did confirm that she takes Metoprolol 50 mg bid.   [facility] called to report critical EKG. Today @ 1:50 PM underlying SR> 10 beats of Vtach heart rate 227, with couplet PACs, single PACs. Patient did not trigger event. Requested report be faxed.   I called the patient twice with no answer and left a message to call back to further discuss heart monitor. Case discussed with Dr. Quentin Ore, will try and get patient in to seem him. Will send to Dr. Oval Linsey for any further recommendations. Patient has follow-up 01/14/19 with Coletta Memos, NP  The patient has two appointments and would like to know if they are both needed.   01/14/20 Coletta Memos 01/21/20 Lars Mage

## 2020-01-11 NOTE — Telephone Encounter (Signed)
See 12/23 phone note

## 2020-01-12 NOTE — Progress Notes (Addendum)
Cardiology Clinic Note   Patient Name: Alexandra Bell Date of Encounter: 01/14/2020  Primary Care Provider:  [name], FNP Primary Cardiologist:  Skeet Latch, MD  Patient Profile    Alexandra Bell 55 year old female presents to the clinic today for follow-up evaluation of her severe LVH, hypertrophic obstructive cardiomyopathy, and hypertension.  Past Medical History    Past Medical History:  Diagnosis Date  . Acute renal failure ([place])   . Arthritis   . Asthma   . 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 Common bile duct stone   . Eosinophilia 01/16/2011  . 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 Fatty liver 11/2010  . GERD (gastroesophageal reflux disease)   . HOCM (hypertrophic obstructive cardiomyopathy) ([place]) 12/10/2019  . Hx: UTI (urinary tract infection)   . Hypertension   . Inguinal hernia   . Morbid obesity ([place]) 12/10/2019  . Obesity, Class III, BMI 40-49.9 (morbid obesity) ([place])   . Periumbilical hernia    Past Surgical History:  Procedure Laterality Date  . ABDOMINAL HYSTERECTOMY    . BRONCHOSCOPY, LYMPH NODE BX  MAY 2011  . CHOLECYSTECTOMY  2001  . ERCP/SPHINCTEROTOMY/STONE EXTRACTION  2001  . FLEXIBLE SIGMOIDOSCOPY  11/29/2010   Procedure: FLEXIBLE SIGMOIDOSCOPY;  Surgeon: Lafayette Dragon, MD;  [facility] ENDOSCOPY;  Service: Endoscopy;  Laterality: N/A;    Allergies  No Known Allergies  History of Present Illness    Alexandra Bell has a PMH of hypertension, asthma, GERD, morbid obesity, LVH, and heart murmur.  She was last seen by Dr. Oval Linsey on 12/10/2019.  During that time her BNP was 451.  She underwent echocardiogram 11/28/2019 showed an LVEF of 65-70% severe LVH grade 2 diastolic dysfunction.  She indicated that overall she was feeling fairly well.  She denied chest pain shortness of breath.  She reported that a urine have ago her husband passed away and she was still struggling with grief.  She attributed her heart pain to the grief.  She  denied exertional chest pain or pressure.  She reported increased lower extremity edema but denied orthopnea PND.  She also reported that her sleep pattern was off but she did not think she had sleep apnea.  She had not been interested in pursuing a sleep evaluation.  She also denied palpitations syncope and presyncope.  She had no sudden history of sudden cardiac death in her family.  She noted that of late her blood pressure has been more difficult to control.  She noted this started after her husband's death.  She reported that more recently her blood pressures have been in the 130s over 80s.  She was not exercising much.  During that time she was in the process of getting an exercise bike and was hoping to use it.  She was unable to use a treadmill due to chronic knee pain.  She had been smoking cigarettes for about 40 years and was able to quit but started back after her husband's death.  She denied drinking alcohol and limited her caffeine intake.  Her 7-day cardiac event monitor 12/31/2019 Predominant underlying rhythm normal sinus One episode of atrial fibrillation with RVR Sinus rhythm with PACs Two riggered episodes showed sinus bradycardia and sinus arrhythmia And one 10 beat episode of ventricular tachycardia Baseline sample showed Sinus Arrhythmia w/PACs with a heart rate of 80.1 bpm.   She was started on metoprolol  She presents to the clinic today for follow-up evaluation states she feels okay.  She indicates that she leads  a sedentary lifestyle.  She is employed at [employer] and works throughout [employer].  She states that she also eats at [employer] but has been eating their food less.  She is trying to make better food choices.  We reviewed her cardiac event monitor and risks of atrial fibrillation.  We also reviewed her echocardiogram.  I will start her on apixaban, increase her lisinopril, give her the salty 6 diet sheet, have her increase her physical activity as tolerated,  and have her follow-up with Dr. Oval Linsey as scheduled.  Today she denies chest pain, increased shortness of breath, lower extremity edema, fatigue, palpitations, melena, hematuria, hemoptysis, diaphoresis, weakness, presyncope, syncope, orthopnea, and PND.   Home Medications    Prior to Admission medications   Medication Sig Start Date End Date Taking? Authorizing Provider  acetaminophen (TYLENOL) 500 MG tablet Take 1,500 mg by mouth every 6 (six) hours as needed. For pain    [provider]  albuterol (PROVENTIL HFA;VENTOLIN HFA) 108 (90 BASE) MCG/ACT inhaler Inhale 2 puffs into the lungs every 6 (six) hours as needed. For asthma 12/01/10   Rai, Vernelle Emerald, MD  amLODipine (NORVASC) 5 MG tablet Take 1 tablet (5 mg total) by mouth daily. 12/10/19 12/09/20  Skeet Latch, MD  aspirin EC 81 MG tablet Take 81 mg by mouth daily as needed. For pain     [provider]  diclofenac Sodium (VOLTAREN) 1 % GEL Apply 2 g topically 4 (four) times daily. 07/16/19   [name], FNP  furosemide (LASIX) 40 MG tablet Take 1 tablet (40 mg total) by mouth daily. 12/10/19   Skeet Latch, MD  gabapentin (NEURONTIN) 300 MG capsule Take 1 capsule (300 mg total) by mouth 3 (three) times daily. 10/29/19   [name], FNP  lisinopril (ZESTRIL) 20 MG tablet Take 1 tablet (20 mg total) by mouth daily. 12/10/19 03/09/20  Skeet Latch, MD  Magnesium 200 MG TABS Take 1 tablet by mouth daily with evening meal 05/07/19   [name], FNP  metFORMIN (GLUCOPHAGE) 500 MG tablet TAKE 1 TABLET BY MOUTH TWICE DAILY WITH A MEAL 12/15/19   [name], FNP  metoprolol tartrate (LOPRESSOR) 50 MG tablet Take 1 tablet (50 mg total) by mouth 2 (two) times daily. 12/10/19 03/09/20  Skeet Latch, MD    Family History    Family History  Problem Relation Age of Onset  . Diabetes Mother   . Colon cancer Father        ?  Marland Kitchen Heart attack Father   . Diabetes Sister    She indicated that her mother is  alive. She indicated that her father is alive. She indicated that her sister is alive.  Social History    Social History   Socioeconomic History  . Marital status: Married    Spouse name: Not on file  . Number of children: 2  . Years of education: Not on file  . Highest education level: Not on file  Occupational History  . Occupation: Theme park manager, Metallurgist: UNEMPLOYED  Tobacco Use  . Smoking status: Current Every Day Smoker    Packs/day: 0.50    Years: 30.00    Pack years: 15.00    Types: Cigarettes  . Smokeless tobacco: Never Used  . Tobacco comment: she is less than a PPD - states down to 6 cigarettes a day - 10/14  Vaping Use  . Vaping Use: Never used  Substance and Sexual Activity  . Alcohol use:  No  . Drug use: No  . Sexual activity: Not on file  Other Topics Concern  . Not on file  Social History Narrative  . Not on file   Social Determinants of Health   Financial Resource Strain: Not on file  Food Insecurity: Not on file  Transportation Needs: Not on file  Physical Activity: Not on file  Stress: Not on file  Social Connections: Not on file  Intimate Partner Violence: Not on file     Review of Systems    General:  No chills, fever, night sweats or weight changes.  Cardiovascular:  No chest pain, dyspnea on exertion, edema, orthopnea, palpitations, paroxysmal nocturnal dyspnea. Dermatological: No rash, lesions/masses Respiratory: No cough, dyspnea Urologic: No hematuria, dysuria Abdominal:   No nausea, vomiting, diarrhea, bright red blood per rectum, melena, or hematemesis Neurologic:  No visual changes, wkns, changes in mental status. All other systems reviewed and are otherwise negative except as noted above.  Physical Exam    VS:  BP (!) 170/72   Pulse 73   Ht 5\' 6"  (1.676 m)   Wt 291 lb 12.8 oz (132.4 kg)   SpO2 94%   BMI 47.10 kg/m  , BMI Body mass index is 47.1 kg/m. GEN: Well nourished, well developed, in no acute distress. HEENT:  normal. Neck: Supple, no JVD, carotid bruits, or masses. Cardiac: RRR, 3/6 systolic murmur heard along left sternal border, rubs, or gallops. No clubbing, cyanosis, edema.  Radials/DP/PT 2+ and equal bilaterally.  Respiratory:  Respirations regular and unlabored, clear to auscultation bilaterally. GI: Soft, nontender, nondistended, BS + x 4. MS: no deformity or atrophy. Skin: warm and dry, no rash. Neuro:  Strength and sensation are intact. Psych: Normal affect.  Accessory Clinical Findings    Recent Labs: 05/07/2019: ALT 11; Hemoglobin 14.5; Platelets 270 10/22/2019: BNP 541.2; BUN 11; Creatinine, Ser 0.72; Potassium 4.5; Sodium 140   Recent Lipid Panel    Component Value Date/Time   CHOL 188 05/07/2019 1542   TRIG 123 05/07/2019 1542   HDL 61 05/07/2019 1542   CHOLHDL 3.1 05/07/2019 1542   LDLCALC 105 (H) 05/07/2019 1542    ECG personally reviewed by me today-none today.  EKG 12/10/2019 Sinus rhythm  fusion complexes, possible LAE, LVH 74 bpm  Echocardiogram 11/26/2019 Left ventricular ejection fraction, by estimation, is 65 to 70%. The  left ventricle has normal function. The left ventricle has no regional  wall motion abnormalities. There is severe concentric left ventricular  hypertrophy. Left ventricular diastolic  parameters are consistent with Grade II diastolic dysfunction  (pseudonormalization).  2. Right ventricular systolic function is normal. The right ventricular  size is normal.  3. Left atrial size was moderately dilated.  4. Right atrial size was moderately dilated.  5. The mitral valve is normal in structure. No evidence of mitral valve  regurgitation.  6. The aortic valve is normal in structure. Aortic valve regurgitation is  not visualized.   Her 7-day cardiac event monitor 12/31/2019 Predominant underlying rhythm normal sinus One episode of atrial fibrillation with RVR Sinus rhythm with PACs Two riggered episodes showed sinus bradycardia and  sinus arrhythmia And one 10 beat episode of ventricular tachycardia Baseline sample showed Sinus Arrhythmia w/PACs with a heart rate of 80.1 bpm.   Assessment & Plan   1.  Paroxysmal atrial fibrillation-heart rate today 73 bpm.  Denies other episodes of fast heart rate or skipped beats.  Cardiac event monitor results detailed above. CHA2DS2-VASc Score and unadjusted Ischemic Stroke  Rate (% per year) is equal to 3.2 % stroke rate/year from a score of 3 [ HTN, DM, Female] Start Eliquis-samples given Continue metoprolol Avoid triggers caffeine, chocolate, EtOH, dehydration etc. Heart healthy low-sodium diet Increase physical activity as tolerated  Essential hypertension-BP today 170/72.  Better controlled at home.  150s over 80s Increase lisinopril to 40 mg daily Continue amlodipine, metoprolol, furosemide Heart healthy low-sodium diet-salty 6 given Increase physical activity as tolerated  Shortness of breath/HOCM-no increased work of breathing or activity tolerance.  LVEF 65-70%, severe left concentric hypertrophy, G2 DD, left and right atrium mildly dilated Continue lisinopril, amlodipine, metoprolol, furosemide Heart healthy low-sodium diet-salty 6 given Increase physical activity as tolerated  Tobacco abuse-has been working towards smoking cessation and smoking much less.  Down to 8 cigarettes/day Smoking cessation information provided  Morbid obesity-weight today 291.8 pounds. Continue weight loss Heart healthy low-sodium diet Increase physical activity as tolerated  Disposition: Follow-up with Dr. Duke Salvia in 2 months.    Thomasene Ripple. Karon Heckendorn NP-C    01/14/2020, 11:55 AM [facility] [address] Office [phone] Fax [phone]  Notice: This dictation was prepared with Dragon dictation along with smaller phrase technology. Any transcriptional errors that result from this process are unintentional and may not be corrected upon  review.

## 2020-01-14 ENCOUNTER — Encounter: Payer: Self-pay | Admitting: General Practice

## 2020-01-14 ENCOUNTER — Other Ambulatory Visit: Payer: Self-pay

## 2020-01-14 ENCOUNTER — Ambulatory Visit (INDEPENDENT_AMBULATORY_CARE_PROVIDER_SITE_OTHER): Payer: 59 | Admitting: General Practice

## 2020-01-14 VITALS — BP 170/72 | HR 73 | Ht 66.0 in | Wt 291.8 lb

## 2020-01-14 DIAGNOSIS — I421 Obstructive hypertrophic cardiomyopathy: Secondary | ICD-10-CM

## 2020-01-14 DIAGNOSIS — I480 Paroxysmal atrial fibrillation: Secondary | ICD-10-CM

## 2020-01-14 DIAGNOSIS — I10 Essential (primary) hypertension: Secondary | ICD-10-CM

## 2020-01-14 DIAGNOSIS — Z720 Tobacco use: Secondary | ICD-10-CM

## 2020-01-14 LAB — HM DIABETES EYE EXAM

## 2020-01-14 MED ORDER — LISINOPRIL 40 MG PO TABS: 40.0000 mg | ORAL_TABLET | Freq: Every day | ORAL | 6 refills | Status: DC

## 2020-01-14 MED ORDER — APIXABAN 5 MG PO TABS: 5.0000 mg | ORAL_TABLET | Freq: Two times a day (BID) | ORAL | 6 refills | Status: DC

## 2020-01-14 NOTE — Patient Instructions (Addendum)
Medication Instructions:  START ELIQUIS 5MG  TWICE DAILY  INCREASE LISINOPRIL 40MG  DAILY  *If you need a refill on your cardiac medications before your next appointment, please call your pharmacy*  Lab Work:   Testing/Procedures:  NONE    NONE  Special Instructions Please try to avoid these triggers:  Do not use any products that have nicotine or tobacco in them. These include cigarettes, e-cigarettes, and chewing tobacco. If you need help quitting, ask your doctor.  Eat heart-healthy foods. Talk with your doctor about the right eating plan for you.  Exercise regularly as told by your doctor.  Do not drink alcohol, Caffeine or chocolate.  Lose weight if you are overweight.  Do not use drugs, including cannabis   PLEASE READ AND FOLLOW SMOKING CESSATION TIPS-ATTACHED  PLEASE READ AND FOLLOW SALTY 6-ATTACHED-1,800mg  daily  PLEASE INCREASE PHYSICAL ACTIVITY AS TOLERATED  CONTINUE WEIGHT LOSS  Follow-Up: Your next appointment:  KEEP SCHEDULED APPOINTMENT  In Person with Skeet Latch, MD   At [facility], you and your health needs are our priority.  As part of our continuing mission to provide you with exceptional heart care, we have created designated Provider Care Teams.  These Care Teams include your primary Cardiologist (physician) and Advanced Practice Providers (APPs -  Physician Assistants and Nurse Practitioners) who all work together to provide you with the care you need, when you need it.            6 SALTY THINGS TO AVOID     1,800MG  DAILY    Steps to Quit Smoking Smoking tobacco is the leading cause of preventable death. It can affect almost every organ in the body. Smoking puts you and people around you at risk for many serious, long-lasting (chronic) diseases. Quitting smoking can be hard, but it is one of the best things that you can do for your health. It is never too late to quit. How do I get ready to quit? When you decide to quit smoking, make a plan  to help you succeed. Before you quit:  Pick a date to quit. Set a date within the next 2 weeks to give you time to prepare.  Write down the reasons why you are quitting. Keep this list in places where you will see it often.  Tell your family, friends, and co-workers that you are quitting. Their support is important.  Talk with your doctor about the choices that may help you quit.  Find out if your health insurance will pay for these treatments.  Know the people, places, things, and activities that make you want to smoke (triggers). Avoid them. What first steps can I take to quit smoking?  Throw away all cigarettes at home, at work, and in your car.  Throw away the things that you use when you smoke, such as ashtrays and lighters.  Clean your car. Make sure to empty the ashtray.  Clean your home, including curtains and carpets. What can I do to help me quit smoking? Talk with your doctor about taking medicines and seeing a counselor at the same time. You are more likely to succeed when you do both.  If you are pregnant or breastfeeding, talk with your doctor about counseling or other ways to quit smoking. Do not take medicine to help you quit smoking unless your doctor tells you to do so. To quit smoking: Quit right away  Quit smoking totally, instead of slowly cutting back on how much you smoke over a period of  time.  Go to counseling. You are more likely to quit if you go to counseling sessions regularly. Take medicine You may take medicines to help you quit. Some medicines need a prescription, and some you can buy over-the-counter. Some medicines may contain a drug called nicotine to replace the nicotine in cigarettes. Medicines may:  Help you to stop having the desire to smoke (cravings).  Help to stop the problems that come when you stop smoking (withdrawal symptoms). Your doctor may ask you to use:  Nicotine patches, gum, or lozenges.  Nicotine inhalers or  sprays.  Non-nicotine medicine that is taken by mouth. Find resources  Find resources and other ways to help you quit smoking and remain smoke-free after you quit. These resources are most helpful when you use them often. They include:  Online chats with a Veterinary surgeon.  Phone quitlines.  Printed Materials engineer.  Support groups or group counseling.  Text messaging programs.  Mobile phone apps. Use apps on your mobile phone or tablet that can help you stick to your quit plan. There are many free apps for mobile phones and tablets as well as websites. Examples include Quit Guide from the [employer] and smokefree.gov  What things can I do to make it easier to quit?    Talk to your family and friends. Ask them to support and encourage you.  Call a phone quitline (1-800-QUIT-NOW), reach out to support groups, or work with a Veterinary surgeon.  Ask people who smoke to not smoke around you.  Avoid places that make you want to smoke, such as: ? Bars. ? Parties. ? Smoke-break areas at work.  Spend time with people who do not smoke.  Lower the stress in your life. Stress can make you want to smoke. Try these things to help your stress: ? Getting regular exercise. ? Doing deep-breathing exercises. ? Doing yoga. ? Meditating. ? Doing a body scan. To do this, close your eyes, focus on one area of your body at a time from head to toe. Notice which parts of your body are tense. Try to relax the muscles in those areas. How will I feel when I quit smoking? Day 1 to 3 weeks Within the first 24 hours, you may start to have some problems that come from quitting tobacco. These problems are very bad 2-3 days after you quit, but they do not often last for more than 2-3 weeks. You may get these symptoms:  Mood swings.  Feeling restless, nervous, angry, or annoyed.  Trouble concentrating.  Dizziness.  Strong desire for high-sugar foods and nicotine.  Weight gain.  Trouble pooping  (constipation).  Feeling like you may vomit (nausea).  Coughing or a sore throat.  Changes in how the medicines that you take for other issues work in your body.  Depression.  Trouble sleeping (insomnia). Week 3 and afterward After the first 2-3 weeks of quitting, you may start to notice more positive results, such as:  Better sense of smell and taste.  Less coughing and sore throat.  Slower heart rate.  Lower blood pressure.  Clearer skin.  Better breathing.  Fewer sick days. Quitting smoking can be hard. Do not give up if you fail the first time. Some people need to try a few times before they succeed. Do your best to stick to your quit plan, and talk with your doctor if you have any questions or concerns. Summary  Smoking tobacco is the leading cause of preventable death. Quitting smoking can be hard,  but it is one of the best things that you can do for your health.  When you decide to quit smoking, make a plan to help you succeed.  Quit smoking right away, not slowly over a period of time.  When you start quitting, seek help from your doctor, family, or friends.

## 2020-01-15 LAB — BASIC METABOLIC PANEL
BUN/Creatinine Ratio: 13 (ref 9–23)
BUN: 13 mg/dL (ref 6–24)
CO2: 27 mmol/L (ref 20–29)
Calcium: 9.6 mg/dL (ref 8.7–10.2)
Chloride: 104 mmol/L (ref 96–106)
Creatinine, Ser: 1.04 mg/dL — ABNORMAL HIGH (ref 0.57–1.00)
GFR calc Af Amer: 70 mL/min/{1.73_m2} (ref >59)
GFR calc non Af Amer: 61 mL/min/{1.73_m2} (ref >59)
Glucose: 90 mg/dL (ref 65–99)
Potassium: 4.4 mmol/L (ref 3.5–5.2)
Sodium: 144 mmol/L (ref 134–144)

## 2020-01-17 ENCOUNTER — Other Ambulatory Visit: Payer: Self-pay | Admitting: Nurse Practitioner

## 2020-01-17 DIAGNOSIS — R7309 Other abnormal glucose: Secondary | ICD-10-CM

## 2020-01-18 ENCOUNTER — Encounter: Payer: Self-pay | Admitting: Nurse Practitioner

## 2020-01-18 ENCOUNTER — Telehealth (HOSPITAL_COMMUNITY): Payer: Self-pay | Admitting: Emergency Medicine

## 2020-01-18 NOTE — Telephone Encounter (Signed)
Attempted to call patient regarding upcoming cardiac MR appointment. Left message on voicemail with name and callback number Lowell Makara RN Navigator Cardiac Imaging [facility] [phone] Office [phone] Cell  

## 2020-01-18 NOTE — Telephone Encounter (Signed)
Pt returning phone call regarding upcoming cardiac imaging study; pt verbalizes understanding of appt date/time, parking situation and where to check in and verified current allergies; name and call back number provided for further questions should they arise Marchia Bond RN Navigator Cardiac Imaging [facility] [phone] office [phone] cell  Pt denies implants, denies claustro

## 2020-01-19 ENCOUNTER — Ambulatory Visit (HOSPITAL_COMMUNITY)
Admission: RE | Admit: 2020-01-19 | Discharge: 2020-01-19 | Disposition: A | Discharge status: Home / Self Care | Payer: Self-pay | Source: Ambulatory Visit | Attending: Cardiovascular Disease | Admitting: Cardiovascular Disease

## 2020-01-19 ENCOUNTER — Other Ambulatory Visit: Payer: Self-pay

## 2020-01-19 DIAGNOSIS — I421 Obstructive hypertrophic cardiomyopathy: Secondary | ICD-10-CM | POA: Insufficient documentation

## 2020-01-19 MED ORDER — GADOBUTROL 1 MMOL/ML IV SOLN
15.0000 mL | Freq: Once | INTRAVENOUS | Status: AC | PRN
  Administered 2020-01-19: 15 mL via INTRAVENOUS

## 2020-01-21 ENCOUNTER — Encounter: Payer: Self-pay | Admitting: Cardiology

## 2020-01-21 ENCOUNTER — Other Ambulatory Visit: Payer: Self-pay

## 2020-01-21 ENCOUNTER — Ambulatory Visit: Payer: Self-pay | Admitting: Cardiology

## 2020-01-21 VITALS — BP 152/74 | HR 75 | Ht 66.0 in | Wt 298.0 lb

## 2020-01-21 DIAGNOSIS — I480 Paroxysmal atrial fibrillation: Secondary | ICD-10-CM

## 2020-01-21 DIAGNOSIS — Z720 Tobacco use: Secondary | ICD-10-CM

## 2020-01-21 DIAGNOSIS — I421 Obstructive hypertrophic cardiomyopathy: Secondary | ICD-10-CM

## 2020-01-21 NOTE — Progress Notes (Addendum)
Electrophysiology Office Note:    Date:  01/21/2020   ID:  Alexandra Bell, DOB 07-27-1965, MRN [id number]  PCP:  [name], FNP  [facility] Cardiologist:  Skeet Latch, MD  [facility] Electrophysiologist:  Vickie Epley, MD   Referring MD: [name], FNP   Chief Complaint: Sudden cardiac death restratification for hypertrophic cardiomyopathy and atrial fibrillation  History of Present Illness:    Alexandra Bell is a 55 y.o. female who presents for an evaluation of atrial fibrillation and hypertrophic cardiomyopathy at the request of Dr. Oval Linsey and Dr. Laurance Flatten. Their medical history includes hypertrophic obstructive cardiomyopathy, hypertension, morbid obesity.  She was last seen by Dr. Oval Linsey on December 10, 2019.  She has a long history of a heart murmur.  She also has PVCs and nonsustained ventricular tachycardia and atrial fibrillation.  For her atrial fibrillation she has been started on Eliquis for stroke prophylaxis.  Patient can feel nonsustained VT.  She also has had some bradycardia noted on a heart monitor.  She is felt lightheaded with this but she has never completely passed out.  Patient tells me during today's visit that she has never had a syncopal episode.  She does have a family history of sudden cardiac death in her maternal aunt.  According to the patient, her maternal aunt passed away suddenly in her 33s.  She thinks her mom also has a heart condition and lives in [place].  She will reach out to family to see what this heart condition is.  She also has a cousin who had a stroke at early age, presumably from atrial fibrillation.  No known family history of hypertrophic cardiomyopathy.  No family history of drowning, defibrillator use.  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   Past Medical History:  Diagnosis Date  . Acute renal failure ([place])   . Arthritis   . Asthma   . Colitis,  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  . Common bile duct stone   . Eosinophilia 01/16/2011  . 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 Fatty liver 11/2010  . GERD (gastroesophageal reflux disease)   . HOCM (hypertrophic obstructive cardiomyopathy) ([place]) 12/10/2019  . Hx: UTI (urinary tract infection)   . Hypertension   . Inguinal hernia   . Morbid obesity ([place]) 12/10/2019  . Obesity, Class III, BMI 40-49.9 (morbid obesity) ([place])   . Periumbilical hernia     Past Surgical History:  Procedure Laterality Date  . ABDOMINAL HYSTERECTOMY    . BRONCHOSCOPY, LYMPH NODE BX  MAY 2011  . CHOLECYSTECTOMY  2001  . ERCP/SPHINCTEROTOMY/STONE EXTRACTION  2001  . FLEXIBLE SIGMOIDOSCOPY  11/29/2010   Procedure: FLEXIBLE SIGMOIDOSCOPY;  Surgeon: Lafayette Dragon, MD;  Location: [facility] ENDOSCOPY;  Service: Endoscopy;  Laterality: N/A;    Current Medications: Current Meds  Medication Sig  . acetaminophen (TYLENOL) 500 MG tablet Take 1,500 mg by mouth every 6 (six) hours as needed. For pain  . albuterol (PROVENTIL HFA;VENTOLIN HFA) 108 (90 BASE) MCG/ACT inhaler Inhale 2 puffs into the lungs every 6 (six) hours as needed. For asthma  . amLODipine (NORVASC) 5 MG tablet Take 1 tablet (5 mg total) by mouth daily.  Marland Kitchen apixaban (ELIQUIS) 5 MG TABS tablet Take 1 tablet (5 mg total) by mouth 2 (two) times daily.  Marland Kitchen aspirin EC 81 MG tablet Take 81 mg by mouth daily as needed. For pain  . diclofenac Sodium (VOLTAREN) 1 % GEL Apply 2 g topically 4 (four) times  daily.  . furosemide (LASIX) 40 MG tablet Take 1 tablet (40 mg total) by mouth daily.  Marland Kitchen gabapentin (NEURONTIN) 300 MG capsule Take 1 capsule (300 mg total) by mouth 3 (three) times daily.  Marland Kitchen lisinopril (ZESTRIL) 40 MG tablet Take 1 tablet (40 mg total) by mouth daily.  . Magnesium 200 MG TABS Take 1 tablet by mouth daily with evening meal  . metFORMIN (GLUCOPHAGE) 500 MG tablet TAKE 1 TABLET BY MOUTH TWICE DAILY WITH A MEAL  . metoprolol tartrate (LOPRESSOR) 50 MG tablet Take 1 tablet  (50 mg total) by mouth 2 (two) times daily.     Allergies:   Patient has no known allergies.   Social History   Socioeconomic History  . Marital status: Married    Spouse name: Not on file  . Number of children: 2  . Years of education: Not on file  . Highest education level: Not on file  Occupational History  . Occupation: Theme park manager, Metallurgist: UNEMPLOYED  Tobacco Use  . Smoking status: Current Every Day Smoker    Packs/day: 0.50    Years: 30.00    Pack years: 15.00    Types: Cigarettes  . Smokeless tobacco: Never Used  . Tobacco comment: she is less than a PPD - states down to 6 cigarettes a day - 10/14  Vaping Use  . Vaping Use: Never used  Substance and Sexual Activity  . Alcohol use: No  . Drug use: No  . Sexual activity: Not on file  Other Topics Concern  . Not on file  Social History Narrative  . Not on file   Social Determinants of Health   Financial Resource Strain: Not on file  Food Insecurity: Not on file  Transportation Needs: Not on file  Physical Activity: Not on file  Stress: Not on file  Social Connections: Not on file     Family History: The patient's family history includes Colon cancer in her father; Diabetes in her mother and sister; Heart attack in her father.  ROS:   Please see the history of present illness.    All other systems reviewed and are negative.  EKGs/Labs/Other Studies Reviewed:    The following studies were reviewed today:  January 19, 2020 personally reviewed 1% A. fib burden Several episodes of nonsustained VT, longest lasting 10 beats at a rate of 227 bpm  A. fib with longest episode lasting 29 minutes   EKG:  The ekg ordered today demonstrates sinus rhythm with left atrial enlargement and a fractionated QRS  Recent Labs: 05/07/2019: ALT 11; Hemoglobin 14.5; Platelets 270 10/22/2019: BNP 541.2 01/14/2020: BUN 13; Creatinine, Ser 1.04; Potassium 4.4; Sodium 144  Recent Lipid Panel    Component Value  Date/Time   CHOL 188 05/07/2019 1542   TRIG 123 05/07/2019 1542   HDL 61 05/07/2019 1542   CHOLHDL 3.1 05/07/2019 1542   LDLCALC 105 (H) 05/07/2019 1542    Physical Exam:    VS:  BP (!) 152/74   Pulse 75   Ht 5\' 6"  (1.676 m)   Wt 298 lb (135.2 kg)   SpO2 91%   BMI 48.10 kg/m     Wt Readings from Last 3 Encounters:  01/21/20 298 lb (135.2 kg)  01/14/20 291 lb 12.8 oz (132.4 kg)  12/10/19 291 lb 3.2 oz (132.1 kg)     GEN:  Well nourished, well developed in no acute distress.  Morbidly obese HEENT: Normal NECK: No JVD; No carotid bruits  LYMPHATICS: No lymphadenopathy CARDIAC: RRR, no murmurs, rubs, gallops RESPIRATORY:  Clear to auscultation without rales, wheezing or rhonchi  ABDOMEN: Soft, non-tender, non-distended MUSCULOSKELETAL:  No edema; No deformity  SKIN: Warm and dry NEUROLOGIC:  Alert and oriented x 3 PSYCHIATRIC:  Normal affect   ASSESSMENT:    1. HOCM (hypertrophic obstructive cardiomyopathy) ([place])    PLAN:    In order of problems listed above:  1. HOCM Patient with hypertrophic obstructive cardiomyopathy.  Patient with severe basal to mid septal hypertrophy (25 mm).  Left ventricular function on the cardiac MRI is 56%.  There is extensive mid wall late gadolinium enhancement in the basal to mid anteroseptal wall, mid and apical anterior wall in the mid and apical inferolateral walls.  The pattern of LGE suggests a high degree of risk for ventricular arrhythmias.  We discussed her hypertrophic cardiomyopathy in detail during today's visit including the possible genetic component and the associated risk for sudden cardiac death.  Would think about traditional risk factors for sudden cardiac death with hypertrophic cardiomyopathy, Alexandra Bell has multiple including frequent nonsustained ventricular tachycardia at a rapid rate, severe hypertrophy with a maximal wall thickness of 25 mm, extensive late gadolinium enhancement in the left ventricle and a family  history of sudden cardiac death in her maternal aunt.  We discussed defibrillator therapy during today's visit including the procedure, its associated risks, and expected recovery time and long-term follow-up plan.  Alexandra Bell would like some time to think about the defibrillator implant before scheduling a date.  She wants to talk over with her family which I think is very reasonable.  She also wants to learn a little more about her mother's heart condition.  We will give her some of the upcoming available dates to the operating room in March and she will let us know when she would like to schedule.  If we proceed, she will need to hold her Eliquis for 2 days prior to the procedure and she will resume it starting 5 days after the procedure.  We would plan for a dual-chamber ICD from [employer].  2. paroxysmal atrial fibrillation Will require lifelong anticoagulation given hypertrophic cardiomyopathy.  I would use a dual-chamber ICD for monitoring the burden of atrial arrhythmias.  3.  Morbid obesity  4.  Tobacco abuse     Medication Adjustments/Labs and Tests Ordered: Current medicines are reviewed at length with the patient today.  Concerns regarding medicines are outlined above.  Orders Placed This Encounter  Procedures  . EKG 12-Lead   No orders of the defined types were placed in this encounter.     ICD Criteria  Current LVEF:33%. Within 12 months prior to implant: No   Heart failure history: No  Cardiomyopathy history: Yes, Non-Ischemic Cardiomyopathy.  Atrial Fibrillation/Atrial Flutter: No.  Ventricular tachycardia history: No.  Cardiac arrest history: No.  History of syndromes with risk of sudden death: Yes, Other HOCM Previous ICD: No.  Current ICD indication: Primary  PPM indication: No.  Class I or II Bradycardia indication present: No  Beta Blocker therapy for 3 or more months: Yes, prescribed.   Ace Inhibitor/ARB therapy for 3 or more months:  Yes, prescribed.    I have seen Alexandra Bell is a 55 y.o. femalepre-procedural and has been referred by Darleen Crocker for consideration of ICD implant for HOCM prevention of sudden death.  The patient's chart has been reviewed and they meet criteria for ICD implant.  I have had a thorough discussion  with the patient reviewing options.  The patient and their family (if available) have had opportunities to ask questions and have them answered. The patient and I have decided together through the [place] Support Tool to proceed with ICD at this time.  Risks, benefits, alternatives to ICD implantation were discussed in detail with the patient today. The patient  understands that the risks include but are not limited to bleeding, infection, pneumothorax, perforation, tamponade, vascular damage, renal failure, MI, stroke, death, inappropriate shocks, and lead dislodgement and wishes to proceed.    Signed, Lars Mage, MD, [facility]  01/21/2020 6:19 PM    Electrophysiology [place]

## 2020-01-21 NOTE — Patient Instructions (Addendum)
Medication Instructions:  Your physician recommends that you continue on your current medications as directed. Please refer to the Current Medication list given to you today.  Labwork: None ordered.  Testing/Procedures: None ordered.  Follow-Up:  The following dates are available for procedures: March 11, 14, 18, 21, 24, 28  Any Other Special Instructions Will Be Listed Below (If Applicable).  If you need a refill on your cardiac medications before your next appointment, please call your pharmacy.     Cardioverter Defibrillator Implantation An implantable cardioverter defibrillator (ICD) is a device that identifies and corrects abnormal heart rhythms. Cardioverter defibrillator implantation is a surgery to place an ICD under the skin in the chest or abdomen. An ICD has a battery, a small computer (pulse generator), and wires (leads) that go into the heart. The ICD detects and corrects two types of dangerous irregular heart rhythms (arrhythmias):  A rapid heart rhythm in the lower chambers of the heart (ventricles). This is called ventricular tachycardia.  The ventricles contracting in an uncoordinated way. This is called ventricular fibrillation. There are different types of ICDs, and the electrical signals from the ICD can be programmed differently based on the condition being treated. The electrical signals from the ICD can be low-energy pulses, high-energy shocks, or a combination of the two. The low-energy pulses are generally used to restore the heartbeat to normal when it is either too slow (bradycardia) or too fast. These pulses are painless. The high-energy shocks are used to treat abnormal rhythms such as ventricular tachycardia or ventricular fibrillation. This shock may feel like a strong jolt in the chest. Your health care provider may recommend an ICD if you have:  Had a ventricular arrhythmia in the past.  A damaged heart because of a disease or heart condition.  A  weakened heart muscle from a heart attack or cardiac arrest.  A congenital heart defect.  Long QT syndrome, which is a disorder of the heart's electrical system.  Brugada syndrome, which is a condition that causes a disruption of the heart's normal rhythm. Tell a health care provider about:  Any allergies you have.  All medicines you are taking, including vitamins, herbs, eye drops, creams, and over-the-counter medicines.  Any problems you or family members have had with anesthetic medicines.  Any blood disorders you have.  Any surgeries you have had.  Any medical conditions you have.  Whether you are pregnant or may be pregnant. What are the risks? Generally, this is a safe procedure. However, problems may occur, including:  Infection.  Bleeding.  Allergic reactions to medicines used during the procedure.  Blood clots.  Swelling or bruising.  Damage to nearby structures or organs, such as nerves, lungs, blood vessels, or the heart where the ICD leads or pulse generator is implanted. What happens before the procedure? Staying hydrated Follow instructions from your health care provider about hydration, which may include:  Up to 2 hours before the procedure - you may continue to drink clear liquids, such as water, clear fruit juice, black coffee, and plain tea.   Eating and drinking restrictions Follow instructions from your health care provider about eating and drinking, which may include:  8 hours before the procedure - stop eating heavy meals or foods, such as meat, fried foods, or fatty foods.  6 hours before the procedure - stop eating light meals or foods, such as toast or cereal.  6 hours before the procedure - stop drinking milk or drinks that contain milk.  2  hours before the procedure - stop drinking clear liquids. Medicines Ask your health care provider about:  Changing or stopping your regular medicines. This is especially important if you are taking  diabetes medicines or blood thinners.  Taking medicines such as aspirin and ibuprofen. These medicines can thin your blood. Do not take these medicines unless your health care provider tells you to take them.  Taking over-the-counter medicines, vitamins, herbs, and supplements. Tests You may have an exam or testing. These may include:  Blood tests.  A test to check the electrical signals in your heart (electrocardiogram, ECG).  Imaging tests, such as a chest X-ray.  Echocardiogram. This is an ultrasound of your heart to evaluate your heart structures and function.  An event monitor or Holter monitor to wear at home. General instructions  Do not use any products that contain nicotine or tobacco for at least 4 weeks before the procedure. These products include cigarettes, chewing tobacco, and vaping devices, such as e-cigarettes. If you need help quitting, ask your health care provider.  Ask your health care provider: ? How your procedure site will be marked. ? What steps will be taken to help prevent infection. These may include:  Removing hair at the surgery site.  Washing skin with a germ-killing soap.  Taking antibiotic medicine.  You may be asked to shower with a germ-killing soap.  Plan to have a responsible adult take you home from the hospital or clinic. What happens during the procedure?  Small monitors will be put on your body. They will be used to check your heart rate, blood pressure, and oxygen level.  A pair of sticky pads (defibrillator pads) may be placed on your back and chest. These pads are able to pace your heart as needed during the procedure.  An IV will be inserted into one of your veins.  You will be given one or more of the following: ? A medicine to help you relax (sedative). ? A medicine to numb the area (local anesthetic). ? A medicine to make you fall asleep(general anesthetic).  A small incision will be made to create a deep pocket under the  skin of your chest or abdomen.  Leads will be guided through a blood vessel into your heart and attached to your heart muscles. Depending on the ICD, the leads may go into one ventricle, or they may go into both ventricles and into an upper chamber of the heart. An X-ray machine (fluoroscope) will be used to help guide the leads. The other end of the leads will be attached to the pulse generator.  The pulse generator will be placed into the pocket under the skin.  The ICD will be tested, and your health care provider will program the ICD for the condition being treated.  The incision will be closed with stitches (sutures), skin glue, adhesive strips, or staples.  A bandage (dressing) will be placed over the incision. The procedure may vary among health care providers and hospitals.   What happens after the procedure?  Your blood pressure, heart rate, breathing rate, and blood oxygen level will be monitored until you leave the hospital or clinic. Your health care provider will also monitor your ICD to make sure it is working properly.  A chest X-ray will be taken to check that the ICD is in the right place.  Do not raise the arm on the side of your procedure higher than your shoulder for as long as told by your health care  provider. This is usually at least 6 weeks.  You may be given an identification card explaining that you have an ICD.  You will be given a remote home monitoring device to use with your ICD to allow your device to communicate with your clinic. Summary  An implantable cardioverter defibrillator (ICD) is a device that identifies and corrects abnormal heart rhythms. Cardioverter defibrillator implantation is a surgery to place an ICD under the skin in the chest or abdomen.  An ICD consists of a battery, a small computer (pulse generator), and wires (leads) that go into the heart.  During the procedure, the ICD will be tested, and your health care provider will program the  ICD for the condition being treated.  After the procedure, a chest X-ray will be taken to check that the ICD is in the right place. This information is not intended to replace advice given to you by your health care provider. Make sure you discuss any questions you have with your health care provider. Document Revised: 06/24/2019 Document Reviewed: 06/24/2019 Elsevier Patient Education  [place].

## 2020-01-22 ENCOUNTER — Telehealth: Payer: Medicaid Other | Admitting: Cardiovascular Disease

## 2020-01-28 ENCOUNTER — Ambulatory Visit (INDEPENDENT_AMBULATORY_CARE_PROVIDER_SITE_OTHER): Payer: 59 | Admitting: Nurse Practitioner

## 2020-01-28 ENCOUNTER — Encounter: Payer: Self-pay | Admitting: Nurse Practitioner

## 2020-01-28 ENCOUNTER — Other Ambulatory Visit: Payer: Self-pay

## 2020-01-28 ENCOUNTER — Telehealth: Payer: Self-pay | Admitting: Cardiology

## 2020-01-28 VITALS — BP 148/86 | HR 87 | Temp 98.6°F | Ht 66.0 in | Wt 292.0 lb

## 2020-01-28 DIAGNOSIS — R7309 Other abnormal glucose: Secondary | ICD-10-CM

## 2020-01-28 DIAGNOSIS — M25561 Pain in right knee: Secondary | ICD-10-CM

## 2020-01-28 DIAGNOSIS — I421 Obstructive hypertrophic cardiomyopathy: Secondary | ICD-10-CM | POA: Diagnosis not present

## 2020-01-28 DIAGNOSIS — I10 Essential (primary) hypertension: Secondary | ICD-10-CM | POA: Diagnosis not present

## 2020-01-28 DIAGNOSIS — R7989 Other specified abnormal findings of blood chemistry: Secondary | ICD-10-CM

## 2020-01-28 MED ORDER — METFORMIN HCL 500 MG PO TABS: ORAL_TABLET | ORAL | 0 refills | Status: DC

## 2020-01-28 NOTE — Patient Instructions (Signed)

## 2020-01-28 NOTE — Telephone Encounter (Signed)
Patient is calling to talk with Dr. Mardene Speak nurse. Please call back

## 2020-01-28 NOTE — Progress Notes (Signed)
I,Yamilka Roman [employer] as a Education administrator for [employer], FNP.,have documented all relevant documentation on the behalf of [name], FNP,as directed by  [name], FNP while in the presence of [name], [employer]. This visit occurred during the SARS-CoV-2 public health emergency.  Safety protocols were in place, including screening questions prior to the visit, additional usage of staff PPE, and extensive cleaning of exam room while observing appropriate contact time as indicated for disinfecting solutions.  Subjective:     Patient ID: Alexandra Bell , female    DOB: [birth year] , 55 y.o.   MRN: [id number]   Chief Complaint  Patient presents with  . Hypertension    HPI  Patient here for a blood pressure And abnormal glucose.  She does have an orthopedic provider (Dr. Berenice Primas)  Wt Readings from Last 3 Encounters: 01/28/20 : 292 lb (132.5 kg) 01/21/20 : 298 lb (135.2 kg) 01/14/20 : 291 lb 12.8 oz (132.4 kg)  Hypertension This is a chronic problem. The current episode started more than 1 year ago. The problem is uncontrolled. Pertinent negatives include no anxiety, chest pain, headaches or palpitations. Risk factors for coronary artery disease include sedentary lifestyle and obesity. Past treatments include ACE inhibitors and diuretics. There are no compliance problems.  There is no history of angina or kidney disease. There is no history of chronic renal disease.     Past Medical History:  Diagnosis Date  . Acute renal failure ([place])   . Arthritis   . Asthma   . 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 Common bile duct stone   . Eosinophilia 01/16/2011  . 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 Fatty liver 11/2010  . GERD (gastroesophageal reflux disease)   . HOCM (hypertrophic obstructive cardiomyopathy) ([place]) 12/10/2019  . Hx: UTI (urinary tract infection)   . Hypertension   . Inguinal hernia   . Morbid obesity ([place]) 12/10/2019  . Obesity, Class III, BMI 40-49.9 (morbid obesity)  ([place])   . Periumbilical hernia      Family History  Problem Relation Age of Onset  . Diabetes Mother   . Colon cancer Father        ?  Marland Kitchen Heart attack Father   . Diabetes Sister      Current Outpatient Medications:  .  acetaminophen (TYLENOL) 500 MG tablet, Take 1,500 mg by mouth every 6 (six) hours as needed. For pain, Disp: , Rfl:  .  albuterol (PROVENTIL HFA;VENTOLIN HFA) 108 (90 BASE) MCG/ACT inhaler, Inhale 2 puffs into the lungs every 6 (six) hours as needed. For asthma, Disp: 1 Inhaler, Rfl: 3 .  amLODipine (NORVASC) 5 MG tablet, Take 1 tablet (5 mg total) by mouth daily., Disp: 90 tablet, Rfl: 1 .  apixaban (ELIQUIS) 5 MG TABS tablet, Take 1 tablet (5 mg total) by mouth 2 (two) times daily., Disp: 60 tablet, Rfl: 6 .  aspirin EC 81 MG tablet, Take 81 mg by mouth daily as needed. For pain, Disp: , Rfl:  .  diclofenac Sodium (VOLTAREN) 1 % GEL, Apply 2 g topically 4 (four) times daily., Disp: 350 g, Rfl: 3 .  furosemide (LASIX) 40 MG tablet, Take 1 tablet (40 mg total) by mouth daily., Disp: 90 tablet, Rfl: 1 .  gabapentin (NEURONTIN) 300 MG capsule, Take 1 capsule (300 mg total) by mouth 3 (three) times daily., Disp: 240 capsule, Rfl: 1 .  lisinopril (ZESTRIL) 40 MG tablet, Take 1 tablet (40 mg total) by mouth daily., Disp: 30 tablet, Rfl: 6 .  Magnesium 200 MG TABS, Take 1 tablet by mouth daily with evening meal, Disp: 90 tablet, Rfl: 1 .  metoprolol tartrate (LOPRESSOR) 50 MG tablet, Take 1 tablet (50 mg total) by mouth 2 (two) times daily., Disp: 180 tablet, Rfl: 1 .  metFORMIN (GLUCOPHAGE) 500 MG tablet, TAKE 1 TABLET BY MOUTH TWICE DAILY WITH A MEAL, Disp: 180 tablet, Rfl: 0   No Known Allergies   Review of Systems  Constitutional: Negative.   HENT: Negative.   Eyes: Negative.   Respiratory: Negative.   Cardiovascular: Negative.  Negative for chest pain, palpitations and leg swelling.  Gastrointestinal: Negative.   Endocrine: Negative for polydipsia, polyphagia and  polyuria.  Genitourinary: Negative.   Musculoskeletal:       Right knee pain - describes as a stabbing pain. Will wake her up out of her sleep.  She has tried tylenol   Skin: Negative.   Neurological: Negative.  Negative for headaches.  Hematological: Negative.   Psychiatric/Behavioral: Negative.      Today's Vitals   01/28/20 1025  BP: (!) 148/86  Pulse: 87  Temp: 98.6 F (37 C)  TempSrc: Oral  Weight: 292 lb (132.5 kg)  Height: 5\' 6"  (1.676 m)   Body mass index is 47.13 kg/m.   Objective:  Physical Exam Vitals reviewed.  Constitutional:      Appearance: She is well-developed.  HENT:     Head: Normocephalic and atraumatic.  Eyes:     Pupils: Pupils are equal, round, and reactive to light.  Cardiovascular:     Rate and Rhythm: Normal rate and regular rhythm.     Pulses: Normal pulses.     Heart sounds: Normal heart sounds. No murmur heard.   Pulmonary:     Effort: Pulmonary effort is normal. No respiratory distress.     Breath sounds: Normal breath sounds. No wheezing.  Musculoskeletal:        General: Normal range of motion.  Skin:    General: Skin is warm and dry.     Capillary Refill: Capillary refill takes less than 2 seconds.  Neurological:     General: No focal deficit present.     Mental Status: She is alert and oriented to person, place, and time.     Cranial Nerves: No cranial nerve deficit.  Psychiatric:        Mood and Affect: Mood normal.        Behavior: Behavior normal.        Thought Content: Thought content normal.        Judgment: Judgment normal.         Assessment And Plan:     1. Essential hypertension  Chronic, blood pressure is slightly elevated today  She is encouraged to continue to focus on quitting smoking  Plans to stop prior to her procedure for a possible pacemaker  2. Abnormal glucose  Chronic, stable  Continue with metformin daily  3. HOCM (hypertrophic obstructive cardiomyopathy) ([place])  This is a new  diagnosis, continue follow up with cardiology  She is considering having a defribillator placed due to also having some vtach/atrial fibrillation on her holter monitor.   She will likely be on lifelong anticoagulation  4. Morbid obesity ([place]) Chronic, encouraged to exercise as tolerated and eat a healthy diet  5. Acute pain of right knee  Tenderness to right knee, she is encouraged to take 2 gabapentin at night to see if this helps her pain, describes as sharp  6. Elevated brain natriuretic  peptide (BNP) level  Was elevated at last visit and was treated with lasix, had improvement of her swelling to her legs and no longer having shortness of breath - Brain natriuretic peptide     Patient was given opportunity to ask questions. Patient verbalized understanding of the plan and was able to repeat key elements of the plan. All questions were answered to their satisfaction.  [name], FNP     I, [name], FNP, have reviewed all documentation for this visit. The documentation on 01/28/20 for the exam, diagnosis, procedures, and orders are all accurate and complete.   THE PATIENT IS ENCOURAGED TO PRACTICE SOCIAL DISTANCING DUE TO THE COVID-19 PANDEMIC.

## 2020-01-29 ENCOUNTER — Encounter: Payer: Self-pay | Admitting: Nurse Practitioner

## 2020-01-29 LAB — BRAIN NATRIURETIC PEPTIDE: BNP: 893.9 pg/mL — ABNORMAL HIGH (ref 0.0–100.0)

## 2020-01-29 NOTE — Telephone Encounter (Signed)
Left message for Pt to call back.  Also sent mychart message.

## 2020-01-31 ENCOUNTER — Other Ambulatory Visit: Payer: Self-pay | Admitting: Nurse Practitioner

## 2020-01-31 DIAGNOSIS — R7309 Other abnormal glucose: Secondary | ICD-10-CM

## 2020-02-03 NOTE — Telephone Encounter (Signed)
Outreach made to Pt.  Left VM.  Advised this nurse would be off until next week.    Will call next week.

## 2020-02-10 ENCOUNTER — Telehealth: Payer: Self-pay | Admitting: Cardiovascular Disease

## 2020-02-10 NOTE — Telephone Encounter (Signed)
Patient returning call.

## 2020-02-11 NOTE — Telephone Encounter (Signed)
Call placed to Pt.  Pt scheduled for ICD on March 25, 2020 at 11:30 am  Labs/ covid test March 23, 2020  Will mail instruction letter  Work up complete

## 2020-02-24 ENCOUNTER — Other Ambulatory Visit: Payer: Self-pay | Admitting: Nurse Practitioner

## 2020-02-24 DIAGNOSIS — R7309 Other abnormal glucose: Secondary | ICD-10-CM

## 2020-02-25 ENCOUNTER — Other Ambulatory Visit: Payer: Self-pay

## 2020-02-25 DIAGNOSIS — R7309 Other abnormal glucose: Secondary | ICD-10-CM

## 2020-02-25 MED ORDER — DAPAGLIFLOZIN PROPANEDIOL 5 MG PO TABS: 5.0000 mg | ORAL_TABLET | Freq: Every day | ORAL | 2 refills | Status: DC

## 2020-02-25 MED ORDER — METFORMIN HCL 500 MG PO TABS: 500.0000 mg | ORAL_TABLET | Freq: Two times a day (BID) | ORAL | 0 refills | Status: DC

## 2020-03-10 ENCOUNTER — Ambulatory Visit (INDEPENDENT_AMBULATORY_CARE_PROVIDER_SITE_OTHER): Payer: 59 | Admitting: Cardiovascular Disease

## 2020-03-10 ENCOUNTER — Encounter: Payer: Self-pay | Admitting: Cardiovascular Disease

## 2020-03-10 ENCOUNTER — Other Ambulatory Visit: Payer: Self-pay

## 2020-03-10 VITALS — BP 162/90 | HR 76 | Ht 66.0 in | Wt 289.6 lb

## 2020-03-10 DIAGNOSIS — I10 Essential (primary) hypertension: Secondary | ICD-10-CM | POA: Diagnosis not present

## 2020-03-10 DIAGNOSIS — I421 Obstructive hypertrophic cardiomyopathy: Secondary | ICD-10-CM | POA: Diagnosis not present

## 2020-03-10 DIAGNOSIS — I472 Ventricular tachycardia: Secondary | ICD-10-CM

## 2020-03-10 DIAGNOSIS — I4729 Other ventricular tachycardia: Secondary | ICD-10-CM

## 2020-03-10 HISTORY — DX: Other ventricular tachycardia: I47.29

## 2020-03-10 HISTORY — DX: Ventricular tachycardia: I47.2

## 2020-03-10 MED ORDER — CARVEDILOL 25 MG PO TABS: 25.0000 mg | ORAL_TABLET | Freq: Two times a day (BID) | ORAL | 3 refills | Status: DC

## 2020-03-10 MED ORDER — FUROSEMIDE 80 MG PO TABS: 80.0000 mg | ORAL_TABLET | Freq: Every day | ORAL | 1 refills | Status: DC

## 2020-03-10 NOTE — Patient Instructions (Addendum)
Medication Instructions:  STOP METOPROLOL   START CARVEDILOL 25 MG TWICE A DAY   INCREASE YOUR FUROSEMIDE TO 80 MG DAILY   *If you need a refill on your cardiac medications before your next appointment, please call your pharmacy*  Lab Work: NONE   Testing/Procedures: NONE   Follow-Up: At [facility], you and your health needs are our priority.  As part of our continuing mission to provide you with exceptional heart care, we have created designated Provider Care Teams.  These Care Teams include your primary Cardiologist (physician) and Advanced Practice Providers (APPs -  Physician Assistants and Nurse Practitioners) who all work together to provide you with the care you need, when you need it.  We recommend signing up for the patient portal called "MyChart".  Sign up information is provided on this After Visit Summary.  MyChart is used to connect with patients for Virtual Visits (Telemedicine).  Patients are able to view lab/test results, encounter notes, upcoming appointments, etc.  Non-urgent messages can be sent to your provider as well.   To learn more about what you can do with MyChart, go to [email or web address].    Your next appointment:   1 month(s)  The format for your next appointment:   In Person  Provider:   You will see one of the following Advanced Practice Providers on your designated Care Team:    Sande Rives, PA-C  Coletta Memos, FNP  Your physician wants you to follow-up in: 6 MONTHS You will receive a reminder letter in the mail two months in advance. If you don't receive a letter, please call our office to schedule the follow-up appointment.

## 2020-03-10 NOTE — Progress Notes (Signed)
Cardiology Office Note   Date:  03/10/2020   ID:  Alexandra Bell, DOB January 27, 1965, MRN [id number]  PCP:  [name], FNP  Cardiologist:   Skeet Latch, MD   No chief complaint on file.    History of Present Illness: Alexandra Bell is a 55 y.o. female with HOCM, paroxysmal atrial fibrillation, hypertension, asthma, GERD,  and obesity here for follow up.  She saw her PCP and had LE edema and was noted to have a heart murmur on exam.  She reports that she has had a murmur for many years.  BNP at that time was 451.  She had an echo 11/2019 that revealed LVEF 65 to 70% with severe LVH and grade 2 diastolic dysfunction.  She was referred for cardiac MRI which revealed LVEF 56% with extensive mid wall late gadolinium enhancement in the basal to mid anteroseptum, mid to apical anterior, and mid to apical inferolateral myocardium.  She wore an ambulatory monitor that did reveal episodes of NSVT and PAF.  She was referred to EP for consideration of an ICD and will be having this on 3/18.  She understands that she needs to have it but is nervous about the procedure.  She has felt well physically.  She does not check her blood pressure often at home but it has been mostly controlled.  She denies any chest pain and her breathing is stable.  She has not noticed any lower extremity edema, orthopnea, or PND.  When she saw her PCP in January BNP was elevated to 893.  She was started on Farxiga.  She is not getting much formal exercise but does walk a lot at work.  She has a treadmill and wonders if it is safe for her to start using it.    Past Medical History:  Diagnosis Date  . Acute renal failure ([place])   . Arthritis   . Asthma   . Colitis, eosinophilic 09/10/8180  . Common bile duct stone   . Eosinophilia 01/16/2011  . 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 Fatty liver 11/2010  . GERD (gastroesophageal reflux disease)   . HOCM (hypertrophic obstructive cardiomyopathy) ([place]) 12/10/2019  .  Hx: UTI (urinary tract infection)   . Hypertension   . Inguinal hernia   . Morbid obesity ([place]) 12/10/2019  . NSVT (nonsustained ventricular tachycardia) ([place]) 03/10/2020  . Obesity, Class III, BMI 40-49.9 (morbid obesity) ([place])   . Periumbilical hernia     Past Surgical History:  Procedure Laterality Date  . ABDOMINAL HYSTERECTOMY    . BRONCHOSCOPY, LYMPH NODE BX  MAY 2011  . CHOLECYSTECTOMY  2001  . ERCP/SPHINCTEROTOMY/STONE EXTRACTION  2001  . FLEXIBLE SIGMOIDOSCOPY  11/29/2010   Procedure: FLEXIBLE SIGMOIDOSCOPY;  Surgeon: Lafayette Dragon, MD;  Location[facility] END[facility]COPY;  Service: Endoscopy;  Laterality: N/A;     Current Outpatient Medications  Medication Sig Dispense Refill  . acetaminophen (TYLENOL) 500 MG tablet Take 1,500 mg by mouth every 6 (six) hours as needed. For pain    . albuterol (PROVENTIL HFA;VENTOLIN HFA) 108 (90 BASE) MCG/ACT inhaler Inhale 2 puffs into the lungs every 6 (six) hours as needed. For asthma 1 Inhaler 3  . amLODipine (NORVASC) 5 MG tablet Take 1 tablet (5 mg total) by mouth daily. 90 tablet 1  . apixaban (ELIQUIS) 5 MG TABS tablet Take 1 tablet (5 mg total) by mouth 2 (two) times daily. 60 tablet 6  . aspirin EC 81 MG tablet Take 81 mg by mouth daily  as needed. For pain    . carvedilol (COREG) 25 MG tablet Take 1 tablet (25 mg total) by mouth 2 (two) times daily. 180 tablet 3  . dapagliflozin propanediol (FARXIGA) 5 MG TABS tablet Take 1 tablet (5 mg total) by mouth daily before breakfast. 30 tablet 2  . diclofenac Sodium (VOLTAREN) 1 % GEL Apply 2 g topically 4 (four) times daily. 350 g 3  . Diclofenac Sodium CR 100 MG 24 hr tablet Take 100 mg by mouth 2 (two) times daily.    Marland Kitchen gabapentin (NEURONTIN) 300 MG capsule Take 1 capsule (300 mg total) by mouth 3 (three) times daily. 240 capsule 1  . lisinopril (ZESTRIL) 40 MG tablet Take 1 tablet (40 mg total) by mouth daily. 30 tablet 6  . Magnesium 200 MG TABS Take 1 tablet by mouth daily with evening meal 90  tablet 1  . metFORMIN (GLUCOPHAGE) 500 MG tablet Take 1 tablet (500 mg total) by mouth 2 (two) times daily with a meal. 180 tablet 0  . furosemide (LASIX) 80 MG tablet Take 1 tablet (80 mg total) by mouth daily. 90 tablet 1   No current facility-administered medications for this visit.    Allergies:   Patient has no known allergies.    Social History:  The patient  reports that she has been smoking cigarettes. She has a 15.00 pack-year smoking history. She has never used smokeless tobacco. She reports that she does not drink alcohol and does not use drugs.   Family History:  The patient's family history includes Colon cancer in her father; Diabetes in her mother and sister; Heart attack in her father.    ROS:  Please see the history of present illness.   Otherwise, review of systems are positive for none.   All other systems are reviewed and negative.    PHYSICAL EXAM: VS:  BP (!) 162/90   Pulse 76   Ht 5\' 6"  (1.676 m)   Wt 289 lb 9.6 oz (131.4 kg)   SpO2 96%   BMI 46.74 kg/m  , BMI Body mass index is 46.74 kg/m. GENERAL:  Well appearing HEENT:  Pupils equal round and reactive, fundi not visualized, oral mucosa unremarkable NECK:  + jugular venous distention to 1 cm above the clavicle at 45 degrees, waveform within normal limits, carotid upstroke brisk and symmetric, no bruits LUNGS:  Clear to auscultation bilaterally HEART:  RRR.  PMI not displaced or sustained,S1 and S2 within normal limits, no S3, no S4, no clicks, no rubs, III/VI systolic murmurs at the LUSB.  Increases with Valsalva and hand grip. ABD:  Flat, positive bowel sounds normal in frequency in pitch, no bruits, no rebound, no guarding, no midline pulsatile mass, no hepatomegaly, no splenomegaly EXT:  2 plus pulses throughout, 1+ LE edema to the upper tibia bilaterally, no cyanosis no clubbing SKIN:  No rashes no nodules NEURO:  Cranial nerves II through XII grossly intact, motor grossly intact throughout PSYCH:   Cognitively intact, oriented to person place and time  EKG:  EKG is not ordered today. The ekg ordered 12/10/19 demonstrates sinus rhythm.  Rate 74 bpm.  LVH.  Ventricular fusion complexes.    Echo 11/2019: 1. Left ventricular ejection fraction, by estimation, is 65 to 70%. The  left ventricle has normal function. The left ventricle has no regional  wall motion abnormalities. There is severe concentric left ventricular  hypertrophy. Left ventricular diastolic  parameters are consistent with Grade II diastolic dysfunction  (pseudonormalization).  2. Right ventricular systolic function is normal. The right ventricular  size is normal.  3. Left atrial size was moderately dilated.  4. Right atrial size was moderately dilated.  5. The mitral valve is normal in structure. No evidence of mitral valve  regurgitation.  6. The aortic valve is normal in structure. Aortic valve regurgitation is  not visualized.   FINDINGS  Left Ventricle: Left ventricular ejection fraction, by estimation, is 65  to 70%. The left ventricle has normal function. The left ventricle has no  regional wall motion abnormalities. Definity contrast agent was given IV  to delineate the left ventricular  endocardial borders. The left ventricular internal cavity size was small.  There is severe concentric left ventricular hypertrophy. Left ventricular  diastolic parameters are consistent with Grade II diastolic dysfunction  (pseudonormalization).   Cardiac MRI 01/2020: IMPRESSION: 1. Normal LV size with severe asymmetric basal to mid septal hypertrophy, EF 56% with normal wall motion. Very mild mitral valve SAM without significant regurgitation. The LGE pattern and low ECV percentage are suggestive of hypertrophic cardiomyopathy. The extensive LGE does suggest a higher risk for ventricular arrhythmias.  2.  Normal right ventricular size and systolic function.  This study is suggestive of hypertrophic  cardiomyopathy.  Recent Labs: 05/07/2019: ALT 11; Hemoglobin 14.5; Platelets 270 01/14/2020: BUN 13; Creatinine, Ser 1.04; Potassium 4.4; Sodium 144 01/28/2020: BNP 893.9    Lipid Panel    Component Value Date/Time   CHOL 188 05/07/2019 1542   TRIG 123 05/07/2019 1542   HDL 61 05/07/2019 1542   CHOLHDL 3.1 05/07/2019 1542   LDLCALC 105 (H) 05/07/2019 1542      Wt Readings from Last 3 Encounters:  03/10/20 289 lb 9.6 oz (131.4 kg)  01/28/20 292 lb (132.5 kg)  01/21/20 298 lb (135.2 kg)      ASSESSMENT AND PLAN:  # Shortness of breath:  # Severe LVH:  # HOCM:  # Hypertension:  Ms. Morocho has severe LVH and HOCM.  There was in intracavitary gradient.  She was found to have late gadolinium enhancement on cardiac MRI and NSVT on ambulatory monitor.  She also has a family member who had sudden cardiac death.  Planning for ICD implantation later this month.  Blood pressure is poorly controlled.  We will switch metoprolol to carvedilol 25 mg twice daily.  We will not increase amlodipine given that she already has some lower extremity edema and this may worsen it.  She does still appear to be volume overloaded and BNP was elevated to 800.  Increase furosemide to 80 mg daily.  We need to be careful not to reduce her preload too much given her hypertrophic obstructive cardiomyopathy.  She is already scheduled to have outpatient labs so we will not repeat a BMP at this time.  # PAF:  Asymptomatic.  Continue Eliquis and switch metoprolol to carvedilol as above.    # Tobacco abuse:   Encourage smoking cessation.  She is not ready to stop at this time.  # Morbid obesity:  Encouraged increase exercise to 150 minutes weekly.   Current medicines are reviewed at length with the patient today.  The patient does not have concerns regarding medicines.  The following changes have been made: Increase furosemide and switch metoprolol to carvedilol  Labs/ tests ordered today include:   No orders  of the defined types were placed in this encounter.    Disposition:   FU with APP in 1 month.  Shakesha Soltau C. Oval Linsey, MD,  FACC in 6 months.     Signed, Alexi Dorminey C. Oval Linsey, MD, [employer]  03/10/2020 1:10 PM    [place]

## 2020-03-23 ENCOUNTER — Other Ambulatory Visit (HOSPITAL_COMMUNITY)
Admission: RE | Admit: 2020-03-23 | Discharge: 2020-03-23 | Disposition: A | Discharge status: Home / Self Care | Payer: 59 | Source: Ambulatory Visit | Attending: Cardiology | Admitting: Cardiology

## 2020-03-23 ENCOUNTER — Other Ambulatory Visit: Payer: Self-pay

## 2020-03-23 ENCOUNTER — Other Ambulatory Visit: Payer: 59 | Admitting: *Deleted

## 2020-03-23 DIAGNOSIS — Z01812 Encounter for preprocedural laboratory examination: Secondary | ICD-10-CM | POA: Insufficient documentation

## 2020-03-23 DIAGNOSIS — I421 Obstructive hypertrophic cardiomyopathy: Secondary | ICD-10-CM

## 2020-03-23 DIAGNOSIS — Z20822 Contact with and (suspected) exposure to covid-19: Secondary | ICD-10-CM | POA: Diagnosis not present

## 2020-03-23 LAB — CBC WITH DIFFERENTIAL/PLATELET
Basophils Absolute: 0 10*3/uL (ref 0.0–0.2)
Basos: 0 %
EOS (ABSOLUTE): 0.1 10*3/uL (ref 0.0–0.4)
Eos: 1 %
Hematocrit: 36.5 % (ref 34.0–46.6)
Hemoglobin: 12.2 g/dL (ref 11.1–15.9)
Lymphocytes Absolute: 2.6 10*3/uL (ref 0.7–3.1)
Lymphs: 28 %
MCH: 29.3 pg (ref 26.6–33.0)
MCHC: 33.4 g/dL (ref 31.5–35.7)
MCV: 88 fL (ref 79–97)
Monocytes Absolute: 0.7 10*3/uL (ref 0.1–0.9)
Monocytes: 7 %
Neutrophils Absolute: 6.1 10*3/uL (ref 1.4–7.0)
Neutrophils: 64 %
Platelets: 296 10*3/uL (ref 150–450)
RBC: 4.17 x10E6/uL (ref 3.77–5.28)
RDW: 13.9 % (ref 11.7–15.4)
WBC: 9.4 10*3/uL (ref 3.4–10.8)

## 2020-03-23 LAB — BASIC METABOLIC PANEL
BUN/Creatinine Ratio: 20 (ref 9–23)
BUN: 15 mg/dL (ref 6–24)
CO2: 29 mmol/L (ref 20–29)
Calcium: 10.8 mg/dL — ABNORMAL HIGH (ref 8.7–10.2)
Chloride: 107 mmol/L — ABNORMAL HIGH (ref 96–106)
Creatinine, Ser: 0.75 mg/dL (ref 0.57–1.00)
Glucose: 79 mg/dL (ref 65–99)
Potassium: 5 mmol/L (ref 3.5–5.2)
Sodium: 143 mmol/L (ref 134–144)
eGFR: 93 mL/min/{1.73_m2} (ref >59)

## 2020-03-23 LAB — SARS CORONAVIRUS 2 (TAT 6-24 HRS): SARS Coronavirus 2: NEGATIVE

## 2020-03-24 NOTE — Pre-Procedure Instructions (Signed)
Attempted to call patient regarding procedure instructions.  No answer 

## 2020-03-25 ENCOUNTER — Ambulatory Visit (HOSPITAL_COMMUNITY)
Admission: RE | Disposition: A | Discharge status: Home / Self Care | Payer: Self-pay | Source: Home / Self Care | Attending: Cardiology

## 2020-03-25 ENCOUNTER — Ambulatory Visit (HOSPITAL_COMMUNITY)
Admission: RE | Admit: 2020-03-25 | Discharge: 2020-03-25 | Disposition: A | Discharge status: Home / Self Care | Payer: 59 | Attending: Cardiology | Admitting: Cardiology

## 2020-03-25 ENCOUNTER — Ambulatory Visit (HOSPITAL_COMMUNITY): Payer: 59

## 2020-03-25 ENCOUNTER — Other Ambulatory Visit: Payer: Self-pay

## 2020-03-25 DIAGNOSIS — Z7901 Long term (current) use of anticoagulants: Secondary | ICD-10-CM | POA: Diagnosis not present

## 2020-03-25 DIAGNOSIS — I422 Other hypertrophic cardiomyopathy: Secondary | ICD-10-CM | POA: Diagnosis not present

## 2020-03-25 DIAGNOSIS — Z95810 Presence of automatic (implantable) cardiac defibrillator: Secondary | ICD-10-CM

## 2020-03-25 DIAGNOSIS — I421 Obstructive hypertrophic cardiomyopathy: Secondary | ICD-10-CM | POA: Insufficient documentation

## 2020-03-25 DIAGNOSIS — Z7984 Long term (current) use of oral hypoglycemic drugs: Secondary | ICD-10-CM | POA: Insufficient documentation

## 2020-03-25 DIAGNOSIS — Z6841 Body Mass Index (BMI) 40.0 and over, adult: Secondary | ICD-10-CM | POA: Diagnosis not present

## 2020-03-25 DIAGNOSIS — Z7982 Long term (current) use of aspirin: Secondary | ICD-10-CM | POA: Insufficient documentation

## 2020-03-25 DIAGNOSIS — I480 Paroxysmal atrial fibrillation: Secondary | ICD-10-CM | POA: Insufficient documentation

## 2020-03-25 DIAGNOSIS — F17210 Nicotine dependence, cigarettes, uncomplicated: Secondary | ICD-10-CM | POA: Diagnosis not present

## 2020-03-25 DIAGNOSIS — Z8249 Family history of ischemic heart disease and other diseases of the circulatory system: Secondary | ICD-10-CM | POA: Insufficient documentation

## 2020-03-25 DIAGNOSIS — Z79899 Other long term (current) drug therapy: Secondary | ICD-10-CM | POA: Insufficient documentation

## 2020-03-25 HISTORY — PX: ICD IMPLANT: EP1208

## 2020-03-25 SURGERY — ICD IMPLANT

## 2020-03-25 MED ORDER — MIDAZOLAM HCL 5 MG/5ML IJ SOLN
INTRAMUSCULAR | Status: AC
  Filled 2020-03-25: qty 5

## 2020-03-25 MED ORDER — POVIDONE-IODINE 10 % EX SWAB
2.0000 "application " | Freq: Once | CUTANEOUS | Status: AC
  Administered 2020-03-25: 2 via TOPICAL

## 2020-03-25 MED ORDER — LIDOCAINE HCL 1 % IJ SOLN
INTRAMUSCULAR | Status: AC
  Filled 2020-03-25: qty 60

## 2020-03-25 MED ORDER — FENTANYL CITRATE (PF) 100 MCG/2ML IJ SOLN
INTRAMUSCULAR | Status: DC | PRN
  Administered 2020-03-25 (×2): 25 ug via INTRAVENOUS

## 2020-03-25 MED ORDER — ACETAMINOPHEN 325 MG PO TABS
325.0000 mg | ORAL_TABLET | ORAL | Status: DC | PRN
  Administered 2020-03-25: 650 mg via ORAL
  Filled 2020-03-25 (×3): qty 2

## 2020-03-25 MED ORDER — MIDAZOLAM HCL 5 MG/5ML IJ SOLN
INTRAMUSCULAR | Status: DC | PRN
  Administered 2020-03-25 (×2): 1 mg via INTRAVENOUS

## 2020-03-25 MED ORDER — SODIUM CHLORIDE 0.9 % IV SOLN: INTRAVENOUS | Status: DC

## 2020-03-25 MED ORDER — ONDANSETRON HCL 4 MG/2ML IJ SOLN: 4.0000 mg | Freq: Four times a day (QID) | INTRAMUSCULAR | Status: DC | PRN

## 2020-03-25 MED ORDER — SODIUM CHLORIDE 0.9 % IV SOLN
80.0000 mg | INTRAVENOUS | Status: DC
  Filled 2020-03-25: qty 2

## 2020-03-25 MED ORDER — CHLORHEXIDINE GLUCONATE 4 % EX LIQD: 4.0000 "application " | Freq: Once | CUTANEOUS | Status: DC

## 2020-03-25 MED ORDER — FENTANYL CITRATE (PF) 100 MCG/2ML IJ SOLN
INTRAMUSCULAR | Status: AC
  Filled 2020-03-25: qty 2

## 2020-03-25 MED ORDER — HEPARIN (PORCINE) IN NACL 1000-0.9 UT/500ML-% IV SOLN
INTRAVENOUS | Status: DC | PRN
  Administered 2020-03-25: 500 mL

## 2020-03-25 MED ORDER — HEPARIN (PORCINE) IN NACL 1000-0.9 UT/500ML-% IV SOLN
INTRAVENOUS | Status: AC
  Filled 2020-03-25: qty 500

## 2020-03-25 MED ORDER — DEXTROSE 5 % IV SOLN
3.0000 g | INTRAVENOUS | Status: AC
  Administered 2020-03-25: 3 g via INTRAVENOUS
  Filled 2020-03-25: qty 4.29
  Filled 2020-03-25: qty 3000

## 2020-03-25 MED ORDER — SODIUM CHLORIDE 0.9 % IV SOLN
INTRAVENOUS | Status: AC
  Filled 2020-03-25: qty 2

## 2020-03-25 MED ORDER — LIDOCAINE HCL (PF) 1 % IJ SOLN
INTRAMUSCULAR | Status: DC | PRN
  Administered 2020-03-25: 25 mL

## 2020-03-25 SURGICAL SUPPLY — 10 items
CABLE SURGICAL S-101-97-12 (CABLE) ×2 IMPLANT
COVER DOME SNAP 22 D (MISCELLANEOUS) ×1 IMPLANT
ICD VIGILANT DR D233 (Pacemaker) ×2 IMPLANT
LEAD INGEVITY 7841 52 (Lead) ×2 IMPLANT
LEAD RELIANCE 0673 ×1 IMPLANT
MAT PREVALON FULL STRYKER (MISCELLANEOUS) ×2 IMPLANT
PAD PRO RADIOLUCENT 2001M-C (PAD) ×2 IMPLANT
SHEATH 7FR PRELUDE SNAP 13 (SHEATH) ×2 IMPLANT
SHEATH 8FR PRELUDE SNAP 13 (SHEATH) ×1 IMPLANT
TRAY PACEMAKER INSERTION (PACKS) ×2 IMPLANT

## 2020-03-25 NOTE — H&P (Signed)
Electrophysiology Office Note:    Date:  01/21/2020   ID:  Alexandra [place], DOB 1965/02/05, MRN [id number]  PCP:  [name], FNP            [facility] Cardiologist:  Skeet Latch, MD  [facility] Electrophysiologist:  Vickie Epley, MD   Referring MD: [name], FNP   Chief Complaint: Sudden cardiac death restratification for hypertrophic cardiomyopathy and atrial fibrillation  History of Present Illness:    Alexandra Bell is a 55 y.o. female who presents for an evaluation of atrial fibrillation and hypertrophic cardiomyopathy at the request of Dr. Oval Linsey and Dr. Laurance Flatten. Their medical history includes hypertrophic obstructive cardiomyopathy, hypertension, morbid obesity.  She was last seen by Dr. Oval Linsey on December 10, 2019.  She has a long history of a heart murmur.  She also has PVCs and nonsustained ventricular tachycardia and atrial fibrillation.  For her atrial fibrillation she has been started on Eliquis for stroke prophylaxis.  Patient can feel nonsustained VT.  She also has had some bradycardia noted on a heart monitor.  She is felt lightheaded with this but she has never completely passed out.  Patient tells me during today's visit that she has never had a syncopal episode.  She does have a family history of sudden cardiac death in her maternal aunt.  According to the patient, her maternal aunt passed away suddenly in her 82s.  She thinks her mom also has a heart condition and lives in [place].  She will reach out to family to see what this heart condition is.  She also has a cousin who had a stroke at early age, presumably from atrial fibrillation.  No known family history of hypertrophic cardiomyopathy.  No family history of drowning, defibrillator use.  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       Past Medical History:  Diagnosis Date  . Acute renal failure ([place])   .  Arthritis   . Asthma   . Colitis, eosinophilic 85/04/6268  . Common bile duct stone   . Eosinophilia 01/16/2011  . Eosinophilic enteritis 35/0093  . Fatty liver 11/2010  . GERD (gastroesophageal reflux disease)   . HOCM (hypertrophic obstructive cardiomyopathy) ([place]) 12/10/2019  . Hx: UTI (urinary tract infection)   . Hypertension   . Inguinal hernia   . Morbid obesity ([place]) 12/10/2019  . Obesity, Class III, BMI 40-49.9 (morbid obesity) ([place])   . Periumbilical hernia          Past Surgical History:  Procedure Laterality Date  . ABDOMINAL HYSTERECTOMY    . BRONCHOSCOPY, LYMPH NODE BX  MAY 2011  . CHOLECYSTECTOMY  2001  . ERCP/SPHINCTEROTOMY/STONE EXTRACTION  2001  . FLEXIBLE SIGMOIDOSCOPY  11/29/2010   Procedure: FLEXIBLE SIGMOIDOSCOPY;  Surgeon: Lafayette Dragon, MD;  Location: [facility] ENDOSCOPY;  Service: Endoscopy;  Laterality: N/A;    Current Medications: Active Medications      Current Meds  Medication Sig  . acetaminophen (TYLENOL) 500 MG tablet Take 1,500 mg by mouth every 6 (six) hours as needed. For pain  . albuterol (PROVENTIL HFA;VENTOLIN HFA) 108 (90 BASE) MCG/ACT inhaler Inhale 2 puffs into the lungs every 6 (six) hours as needed. For asthma  . amLODipine (NORVASC) 5 MG tablet Take 1 tablet (5 mg total) by mouth daily.  Marland Kitchen apixaban (ELIQUIS) 5 MG TABS tablet Take 1 tablet (5 mg total) by mouth 2 (two) times daily.  Marland Kitchen aspirin EC 81 MG  tablet Take 81 mg by mouth daily as needed. For pain  . diclofenac Sodium (VOLTAREN) 1 % GEL Apply 2 g topically 4 (four) times daily.  . furosemide (LASIX) 40 MG tablet Take 1 tablet (40 mg total) by mouth daily.  Marland Kitchen gabapentin (NEURONTIN) 300 MG capsule Take 1 capsule (300 mg total) by mouth 3 (three) times daily.  Marland Kitchen lisinopril (ZESTRIL) 40 MG tablet Take 1 tablet (40 mg total) by mouth daily.  . Magnesium 200 MG TABS Take 1 tablet by mouth daily with evening meal  . metFORMIN (GLUCOPHAGE) 500 MG tablet TAKE 1 TABLET BY MOUTH  TWICE DAILY WITH A MEAL  . metoprolol tartrate (LOPRESSOR) 50 MG tablet Take 1 tablet (50 mg total) by mouth 2 (two) times daily.       Allergies:   Patient has no known allergies.   Social History        Socioeconomic History  . Marital status: Married    Spouse name: Not on file  . Number of children: 2  . Years of education: Not on file  . Highest education level: Not on file  Occupational History  . Occupation: Theme park manager, Metallurgist: UNEMPLOYED  Tobacco Use  . Smoking status: Current Every Day Smoker    Packs/day: 0.50    Years: 30.00    Pack years: 15.00    Types: Cigarettes  . Smokeless tobacco: Never Used  . Tobacco comment: she is less than a PPD - states down to 6 cigarettes a day - 10/14  Vaping Use  . Vaping Use: Never used  Substance and Sexual Activity  . Alcohol use: No  . Drug use: No  . Sexual activity: Not on file  Other Topics Concern  . Not on file  Social History Narrative  . Not on file   Social Determinants of Health   Financial Resource Strain: Not on file  Food Insecurity: Not on file  Transportation Needs: Not on file  Physical Activity: Not on file  Stress: Not on file  Social Connections: Not on file     Family History: The patient's family history includes Colon cancer in her father; Diabetes in her mother and sister; Heart attack in her father.  ROS:   Please see the history of present illness.    All other systems reviewed and are negative.  EKGs/Labs/Other Studies Reviewed:    The following studies were reviewed today:  January 19, 2020 personally reviewed 1% A. fib burden Several episodes of nonsustained VT, longest lasting 10 beats at a rate of 227 bpm  A. fib with longest episode lasting 29 minutes   EKG:  The ekg ordered today demonstrates sinus rhythm with left atrial enlargement and a fractionated QRS  Recent Labs: 05/07/2019: ALT 11; Hemoglobin 14.5; Platelets 270 10/22/2019:  BNP 541.2 01/14/2020: BUN 13; Creatinine, Ser 1.04; Potassium 4.4; Sodium 144  Recent Lipid Panel Labs (Brief)          Component Value Date/Time   CHOL 188 05/07/2019 1542   TRIG 123 05/07/2019 1542   HDL 61 05/07/2019 1542   CHOLHDL 3.1 05/07/2019 1542   LDLCALC 105 (H) 05/07/2019 1542      Physical Exam:    VS:  BP (!) 152/74   Pulse 75   Ht 5\' 6"  (1.676 m)   Wt 298 lb (135.2 kg)   SpO2 91%   BMI 48.10 kg/m        Wt Readings from Last 3 Encounters:  01/21/20 298 lb (135.2 kg)  01/14/20 291 lb 12.8 oz (132.4 kg)  12/10/19 291 lb 3.2 oz (132.1 kg)     GEN:  Well nourished, well developed in no acute distress.  Morbidly obese HEENT: Normal NECK: No JVD; No carotid bruits LYMPHATICS: No lymphadenopathy CARDIAC: RRR, no murmurs, rubs, gallops RESPIRATORY:  Clear to auscultation without rales, wheezing or rhonchi  ABDOMEN: Soft, non-tender, non-distended MUSCULOSKELETAL:  No edema; No deformity  SKIN: Warm and dry NEUROLOGIC:  Alert and oriented x 3 PSYCHIATRIC:  Normal affect   ASSESSMENT:    1. HOCM (hypertrophic obstructive cardiomyopathy) ([place])    PLAN:    In order of problems listed above:  1. HOCM Patient with hypertrophic obstructive cardiomyopathy.  Patient with severe basal to mid septal hypertrophy (25 mm).  Left ventricular function on the cardiac MRI is 56%.  There is extensive mid wall late gadolinium enhancement in the basal to mid anteroseptal wall, mid and apical anterior wall in the mid and apical inferolateral walls.  The pattern of LGE suggests a high degree of risk for ventricular arrhythmias.  We discussed her hypertrophic cardiomyopathy in detail during today's visit including the possible genetic component and the associated risk for sudden cardiac death.  Would think about traditional risk factors for sudden cardiac death with hypertrophic cardiomyopathy, Ms. Lafata has multiple including frequent nonsustained ventricular  tachycardia at a rapid rate, severe hypertrophy with a maximal wall thickness of 25 mm, extensive late gadolinium enhancement in the left ventricle and a family history of sudden cardiac death in her maternal aunt.  We discussed defibrillator therapy during today's visit including the procedure, its associated risks, and expected recovery time and long-term follow-up plan.  Ms. Wakeman would like some time to think about the defibrillator implant before scheduling a date.  She wants to talk over with her family which I think is very reasonable.  She also wants to learn a little more about her mother's heart condition.  We will give her some of the upcoming available dates to the operating room in March and she will let us know when she would like to schedule.  If we proceed, she will need to hold her Eliquis for 2 days prior to the procedure and she will resume it starting 5 days after the procedure.   2. paroxysmal atrial fibrillation Will require lifelong anticoagulation given hypertrophic cardiomyopathy.  I would use a dual-chamber ICD for monitoring the burden of atrial arrhythmias.  3.  Morbid obesity  4.  Tobacco abuse      ----------------------------------------------------------------------------  I have seen, examined the patient, and reviewed the above assessment and plan.    Plan for ICD implant.    Vickie Epley, MD 03/25/2020 9:43 AM

## 2020-03-25 NOTE — Progress Notes (Signed)
Dr Quentin Ore in and checked client; ok to d/c home; Dr Quentin Ore looked at cxr

## 2020-03-25 NOTE — Discharge Instructions (Signed)
    Supplemental Discharge Instructions for  Pacemaker/Defibrillator Patients  Tomorrow, 03/26/20, send in a device transmission  Activity No heavy lifting or vigorous activity with your left/right arm for 6 to 8 weeks.  Do not raise your left/right arm above your head for one week.  Gradually raise your affected arm as drawn below.              03/30/20                    03/31/20                    04/01/20                   04/02/20 __  NO DRIVING until cleared to at your wound check visit.  WOUND CARE - Keep the wound area clean and dry.  Do not get this area wet , no showers until cleared to at your wound check visit. - Tomorrow, 03/26/20, remove the arm sling - Tomorrow, 03/26/20 remove the outer plastic bandage.  Underneath the plastic bandage there are steri strips (paper tapes), DO NOT remove these.    The tape/steri-strips on your wound will fall off; do not pull them off.  No bandage is needed on the site.  DO  NOT apply any creams, oils, or ointments to the wound area. - If you notice any drainage or discharge from the wound, any swelling or bruising at the site, or you develop a fever > 101? F after you are discharged home, call the office at once.  Special Instructions - You are still able to use cellular telephones; use the ear opposite the side where you have your pacemaker/defibrillator.  Avoid carrying your cellular phone near your device. - When traveling through airports, show security personnel your identification card to avoid being screened in the metal detectors.  Ask the security personnel to use the hand wand. - Avoid arc welding equipment, MRI testing (magnetic resonance imaging), TENS units (transcutaneous nerve stimulators).  Call the office for questions about other devices. - Avoid electrical appliances that are in poor condition or are not properly grounded. - Microwave ovens are safe to be near or to operate.  Additional information for defibrillator patients  should your device go off: - If your device goes off ONCE and you feel fine afterward, notify the device clinic nurses. - If your device goes off ONCE and you do not feel well afterward, call 911. - If your device goes off TWICE, call 911. - If your device goes off THREE times in one day, call 911.  DO NOT DRIVE YOURSELF OR A FAMILY MEMBER WITH A DEFIBRILLATOR TO THE HOSPITAL--CALL 911.

## 2020-03-28 ENCOUNTER — Encounter (HOSPITAL_COMMUNITY): Payer: Self-pay | Admitting: Cardiology

## 2020-03-28 MED FILL — Gentamicin Sulfate Inj 40 MG/ML: INTRAMUSCULAR | Qty: 80 | Status: AC

## 2020-03-28 MED FILL — Lidocaine HCl Local Inj 1%: INTRAMUSCULAR | Qty: 25 | Status: AC

## 2020-03-28 MED FILL — Lidocaine HCl Local Inj 1%: INTRAMUSCULAR | Qty: 20 | Status: AC

## 2020-04-07 ENCOUNTER — Ambulatory Visit (INDEPENDENT_AMBULATORY_CARE_PROVIDER_SITE_OTHER): Payer: 59 | Admitting: Emergency Medicine

## 2020-04-07 ENCOUNTER — Other Ambulatory Visit: Payer: Self-pay

## 2020-04-07 DIAGNOSIS — I421 Obstructive hypertrophic cardiomyopathy: Secondary | ICD-10-CM

## 2020-04-07 LAB — CUP PACEART INCLINIC DEVICE CHECK
Brady Statistic RA Percent Paced: 21 %
Brady Statistic RV Percent Paced: 1 % — CL
Date Time Interrogation Session: 20220331142302
HighPow Impedance: 61 Ohm
Implantable Lead Implant Date: 20220318
Implantable Lead Implant Date: 20220318
Implantable Lead Location: 753859
Implantable Lead Location: 753860
Implantable Lead Model: 673
Implantable Lead Model: 7841
Implantable Lead Serial Number: 1122351
Implantable Lead Serial Number: 158410
Implantable Pulse Generator Implant Date: 20220318
Lead Channel Impedance Value: 451 Ohm
Lead Channel Impedance Value: 664 Ohm
Lead Channel Pacing Threshold Amplitude: 0.6 V
Lead Channel Pacing Threshold Amplitude: 0.7 V
Lead Channel Pacing Threshold Pulse Width: 0.4 ms
Lead Channel Pacing Threshold Pulse Width: 0.4 ms
Lead Channel Sensing Intrinsic Amplitude: 10.1 mV
Lead Channel Sensing Intrinsic Amplitude: 25 mV
Lead Channel Setting Pacing Amplitude: 3.5 V
Lead Channel Setting Pacing Amplitude: 3.5 V
Lead Channel Setting Pacing Pulse Width: 0.4 ms
Lead Channel Setting Sensing Sensitivity: 0.5 mV
Pulse Gen Serial Number: 593724

## 2020-04-07 NOTE — Progress Notes (Signed)
Wound check appointment. Steri-strips removed. Wound without redness or edema. Incision edges approximated, wound well healed. Normal device function. Thresholds, sensing, and impedances consistent with implant measurements. Device programmed at 3.5V for extra safety margin until 3 month visit. Histogram distribution appropriate for patient and level of activity. 1 AHR episode- 7 seconds with average A/V rate of 195/93. +OAC.  No ventricular arrhythmias noted. Patient educated about wound care, arm mobility, lifting restrictions, shock plan. ROV with Dr. Quentin Ore on 07/01/20.  Patient is enrolled in remote monitoring, next scheduled check 07/01/20.

## 2020-04-11 ENCOUNTER — Telehealth: Payer: Self-pay

## 2020-04-11 NOTE — Telephone Encounter (Signed)
Left detailed VM for pt advising that she may return to work.  Contatc office if she needs written documentation for that.

## 2020-04-11 NOTE — Telephone Encounter (Signed)
-----   Message from Vickie Epley, MD sent at 04/10/2020  7:38 AM EDT ----- That is OK with me.  Thx, CL  ----- Message ----- From: Damian Leavell, RN Sent: 04/07/2020  10:08 AM EDT To: Vickie Epley, MD, Ruchama Kubicek Alois Cliche, RN  Ok, let me know Sonia Baller ----- Message ----- From: York Ram, RN Sent: 04/07/2020   9:59 AM EDT To: Damian Leavell, RN, Vickie Epley, MD  Patient was in office for her wound check today, wound and device look good, no issues.  She received Dual ICD for preventative measures.  She is wondering when she can return to work?  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  She denies any manual labor aspect of the position.  She feels ready to go back.  She is not sure if she will need an MD note to return, she will check with them today.

## 2020-04-12 NOTE — Progress Notes (Signed)
Cardiology Clinic Note   Patient Name: Alexandra Bell Date of Encounter: 04/14/2020  Primary Care Provider:  [name], FNP Primary Cardiologist:  Skeet Latch, MD  Patient Profile    Alexandra Bell 55 year old female presents to the clinic today for follow-up evaluation of her severe LVH, hypertrophic obstructive cardiomyopathy, and hypertension.  Past Medical History    Past Medical History:  Diagnosis Date  . Acute renal failure ([place])   . Arthritis   . Asthma   . Colitis, eosinophilic 47/04/2593  . Common bile duct stone   . Eosinophilia 01/16/2011  . 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 Fatty liver 11/2010  . GERD (gastroesophageal reflux disease)   . HOCM (hypertrophic obstructive cardiomyopathy) ([place]) 12/10/2019  . Hx: UTI (urinary tract infection)   . Hypertension   . Inguinal hernia   . Morbid obesity ([place]) 12/10/2019  . NSVT (nonsustained ventricular tachycardia) ([place]) 03/10/2020  . Obesity, Class III, BMI 40-49.9 (morbid obesity) ([place])   . Periumbilical hernia    Past Surgical History:  Procedure Laterality Date  . ABDOMINAL HYSTERECTOMY    . BRONCHOSCOPY, LYMPH NODE BX  MAY 2011  . CHOLECYSTECTOMY  2001  . ERCP/SPHINCTEROTOMY/STONE EXTRACTION  2001  . FLEXIBLE SIGMOIDOSCOPY  11/29/2010   Procedure: FLEXIBLE SIGMOIDOSCOPY;  Surgeon: Lafayette Dragon, MD;  Location: [facility] ENDOSCOPY;  Service: Endoscopy;  Laterality: N/A;  . ICD IMPLANT N/A 03/25/2020   Procedure: ICD IMPLANT;  Surgeon: Vickie Epley, MD;  Location: [facility] LAB;  Service: Cardiovascular;  Laterality: N/A;    Allergies  No Known Allergies  History of Present Illness    Ms. Kubota has a PMH of hypertension, asthma, GERD, morbid obesity, LVH, and heart murmur.  She was last seen by Dr. Oval Linsey on 12/10/2019.  During that time her BNP was 451.  She underwent echocardiogram 11/28/2019 showed an LVEF of 65-70% severe LVH grade 2 diastolic dysfunction.  She indicated that overall she  was feeling fairly well.  She denied chest pain shortness of breath.  She reported that a urine have ago her husband passed away and she was still struggling with grief.  She attributed her heart pain to the grief.  She denied exertional chest pain or pressure.  She reported increased lower extremity edema but denied orthopnea PND.  She also reported that her sleep pattern was off but she did not think she had sleep apnea.  She had not been interested in pursuing a sleep evaluation.  She also denied palpitations syncope and presyncope.  She had no sudden history of sudden cardiac death in her family.  She noted that of late her blood pressure has been more difficult to control.  She noted this started after her husband's death.  She reported that more recently her blood pressures have been in the 130s over 80s.  She was not exercising much.  During that time she was in the process of getting an exercise bike and was hoping to use it.  She was unable to use a treadmill due to chronic knee pain.  She had been smoking cigarettes for about 40 years and was able to quit but started back after her husband's death.  She denied drinking alcohol and limited her caffeine intake.  Her 7-day cardiac event monitor 12/31/2019 Predominant underlying rhythm normal sinus One episode of atrial fibrillation with RVR Sinus rhythm with PACs Two riggered episodes showed sinus bradycardia and sinus arrhythmia And one 10 beat episode of ventricular tachycardia Baseline sample showed  Sinus Arrhythmia w/PACs with a heart rate of 80.1 bpm.   She was started on metoprolol  She presented to the clinic 01/14/20 for follow-up evaluation stated she felt okay.  She indicated that she lead a sedentary lifestyle.  She was employed at [employer] and worked throughout [employer].  She stated that she also ate at the restaurant but had been eating their food less.  She was trying to make better food choices.  We reviewed her cardiac  event monitor and risks of atrial fibrillation.  We also reviewed her echocardiogram.  I  started her on apixaban, increased her lisinopril, gave her the salty 6 diet sheet, asked her to increase her physical activity as tolerated, and have her follow-up with Dr. Oval Linsey as scheduled.  She was seen by Dr. Oval Linsey on 03/10/2020.  She reported that she felt well.  She reported that she did not check her blood pressure regularly at home but felt it was fairly well controlled.  She denied chest pain and reported her breathing was stable.  She reported she was not getting much exercise but was walking quite a bit at work.  She asked if it was safe for her to use her treadmill at home.  Her blood pressure was 162/90.  It was felt that she was still fluid volume overloaded and her BNP was 800.  Her furosemide was increased to 80 mg and her metoprolol was switched to carvedilol.  She underwent ICD implant on 03/25/2020 for HOCM/sudden cardiac death or stratification.  She was discharged in stable condition on 03/25/2020 1747 PM  She presents the clinic today for follow-up evaluation states she feels well.  She reports that she went to her pharmacy to pick up her carvedilol and was not able to get it.  She reports that the medication was not there.  We will refill the medication.  She had not taken her medication yet this morning and her blood pressure initially was 170/68 on recheck it was 146/64.  She has been continuing to cut her smoking back is now only smoking 4 cigarettes/day.  She is trying to limit the salt in her diet and continues to be physically active.  Her ICD incision is well-healed and she denies complications related to her surgery.  She reports compliance with her Eliquis and denies bleeding issues.  I will refill her carvedilol, follow-up with her virtually in 1 month, give her a blood pressure log, salty 6 diet sheet have her continue her physical activity, and give her the [place] support stocking  sheet.  Today she denies chest pain, increased shortness of breath, lower extremity edema, fatigue, palpitations, melena, hematuria, hemoptysis, diaphoresis, weakness, presyncope, syncope, orthopnea, and PND.  Home Medications    Prior to Admission medications   Medication Sig Start Date End Date Taking? Authorizing Provider  acetaminophen (TYLENOL) 500 MG tablet Take 1,500 mg by mouth every 6 (six) hours as needed (For pain).    [provider]  albuterol (PROVENTIL HFA;VENTOLIN HFA) 108 (90 BASE) MCG/ACT inhaler Inhale 2 puffs into the lungs every 6 (six) hours as needed. For asthma 12/01/10   Rai, Vernelle Emerald, MD  amLODipine (NORVASC) 5 MG tablet Take 1 tablet (5 mg total) by mouth daily. Patient taking differently: Take 5 mg by mouth every evening. 12/10/19 12/09/20  Skeet Latch, MD  apixaban (ELIQUIS) 5 MG TABS tablet Take 1 tablet (5 mg total) by mouth 2 (two) times daily. 01/14/20   Deberah Pelton, NP  aspirin  EC 81 MG tablet Take 81 mg by mouth every morning.    [provider]  carvedilol (COREG) 25 MG tablet Take 1 tablet (25 mg total) by mouth 2 (two) times daily. 03/10/20 06/08/20  Skeet Latch, MD  dapagliflozin propanediol (FARXIGA) 5 MG TABS tablet Take 1 tablet (5 mg total) by mouth daily before breakfast. 02/25/20   [name], FNP  diclofenac Sodium (VOLTAREN) 1 % GEL Apply 2 g topically 4 (four) times daily. Patient taking differently: Apply 2 g topically 4 (four) times daily as needed (pain). 07/16/19   [name], FNP  Diclofenac Sodium CR 100 MG 24 hr tablet Take 100 mg by mouth every morning. 02/09/20   [provider]  furosemide (LASIX) 80 MG tablet Take 1 tablet (80 mg total) by mouth daily. Patient taking differently: Take 80 mg by mouth daily at 2 PM. 03/10/20   Skeet Latch, MD  gabapentin (NEURONTIN) 300 MG capsule Take 1 capsule (300 mg total) by mouth 3 (three) times daily. 10/29/19   [name], FNP  lisinopril (ZESTRIL) 40  MG tablet Take 1 tablet (40 mg total) by mouth daily. Patient taking differently: Take by mouth every morning. 01/14/20 04/13/20  Deberah Pelton, NP  Magnesium 250 MG TABS Take 250 mg by mouth daily at 2 PM.    [provider]  melatonin 5 MG TABS Take 5 mg by mouth daily at 2 PM.    [provider]  metFORMIN (GLUCOPHAGE) 500 MG tablet Take 1 tablet (500 mg total) by mouth 2 (two) times daily with a meal. 02/25/20   [name], FNP  metoprolol tartrate (LOPRESSOR) 50 MG tablet Take 50 mg by mouth 2 (two) times daily.    [provider]  Multiple Vitamin (MULTIVITAMIN WITH MINERALS) TABS tablet Take 1 tablet by mouth in the morning. One-A-Day Women's Multivitamin    [provider]    Family History    Family History  Problem Relation Age of Onset  . Diabetes Mother   . Colon cancer Father        ?  Marland Kitchen Heart attack Father   . Diabetes Sister    She indicated that her mother is alive. She indicated that her father is alive. She indicated that her sister is alive.  Social History    Social History   Socioeconomic History  . Marital status: Married    Spouse name: Not on file  . Number of children: 2  . Years of education: Not on file  . Highest education level: Not on file  Occupational History  . Occupation: Theme park manager, Metallurgist: UNEMPLOYED  Tobacco Use  . Smoking status: Current Every Day Smoker    Packs/day: 0.50    Years: 30.00    Pack years: 15.00    Types: Cigarettes  . Smokeless tobacco: Never Used  . Tobacco comment: she is less than a PPD - states down to 6 cigarettes a day - 10/14  Vaping Use  . Vaping Use: Never used  Substance and Sexual Activity  . Alcohol use: No  . Drug use: No  . Sexual activity: Not on file  Other Topics Concern  . Not on file  Social History Narrative  . Not on file   Social Determinants of Health   Financial Resource Strain: Not on file  Food Insecurity: Not on file  Transportation  Needs: Not on file  Physical Activity: Not on file  Stress: Not on file  Social Connections: Not on file  Intimate Partner Violence: Not on file     Review of Systems    General:  No chills, fever, night sweats or weight changes.  Cardiovascular:  No chest pain, dyspnea on exertion, edema, orthopnea, palpitations, paroxysmal nocturnal dyspnea. Dermatological: No rash, lesions/masses Respiratory: No cough, dyspnea Urologic: No hematuria, dysuria Abdominal:   No nausea, vomiting, diarrhea, bright red blood per rectum, melena, or hematemesis Neurologic:  No visual changes, wkns, changes in mental status. All other systems reviewed and are otherwise negative except as noted above.  Physical Exam    VS:  BP (!) 146/64 (BP Location: Left Arm, Patient Position: Sitting, Cuff Size: Large)   Pulse 76   Wt 282 lb 12.8 oz (128.3 kg)   SpO2 96%   BMI 45.65 kg/m  , BMI Body mass index is 45.65 kg/m. GEN: Well nourished, well developed, in no acute distress. HEENT: normal. Neck: Supple, no JVD, carotid bruits, or masses. Cardiac: RRR, 3/6 systolic murmur heard along left sternal border, rubs, or gallops. No clubbing, cyanosis, edema.  Radials/DP/PT 2+ and equal bilaterally.  Respiratory:  Respirations regular and unlabored, clear to auscultation bilaterally. GI: Soft, nontender, nondistended, BS + x 4. MS: no deformity or atrophy. Skin: warm and dry, no rash.  Healing right shin ulcer Neuro:  Strength and sensation are intact. Psych: Normal affect.  Accessory Clinical Findings    Recent Labs: 05/07/2019: ALT 11 01/28/2020: BNP 893.9 03/23/2020: BUN 15; Creatinine, Ser 0.75; Hemoglobin 12.2; Platelets 296; Potassium 5.0; Sodium 143   Recent Lipid Panel    Component Value Date/Time   CHOL 188 05/07/2019 1542   TRIG 123 05/07/2019 1542   HDL 61 05/07/2019 1542   CHOLHDL 3.1 05/07/2019 1542   LDLCALC 105 (H) 05/07/2019 1542    ECG personally reviewed by me today-none today.  EKG  12/10/2019 Sinus rhythm  fusion complexes, possible LAE, LVH 74 bpm  Echocardiogram 11/26/2019 Left ventricular ejection fraction, by estimation, is 65 to 70%. The  left ventricle has normal function. The left ventricle has no regional  wall motion abnormalities. There is severe concentric left ventricular  hypertrophy. Left ventricular diastolic  parameters are consistent with Grade II diastolic dysfunction  (pseudonormalization).  2. Right ventricular systolic function is normal. The right ventricular  size is normal.  3. Left atrial size was moderately dilated.  4. Right atrial size was moderately dilated.  5. The mitral valve is normal in structure. No evidence of mitral valve  regurgitation.  6. The aortic valve is normal in structure. Aortic valve regurgitation is  not visualized.   Her 7-day cardiac event monitor 12/31/2019 Predominant underlying rhythm normal sinus One episode of atrial fibrillation with RVR Sinus rhythm with PACs Two riggered episodes showed sinus bradycardia and sinus arrhythmia And one 10 beat episode of ventricular tachycardia Baseline sample showed Sinus Arrhythmia w/PACs with a heart rate of 80.1 bpm.   Assessment & Plan   1.  Paroxysmal atrial fibrillation-heart rate today 76 bpm.  Denies other episodes of fast heart rate or skipped beats.    CHA2DS2-VASc Score and unadjusted Ischemic Stroke Rate (% per year) is equal to 3.2 % stroke rate/year from a score of 3 [ HTN, DM, Female] Continue Eliquis Continue carvedilol Avoid triggers caffeine, chocolate, EtOH, dehydration etc. Heart healthy low-sodium diet Increase physical activity as tolerated  Essential hypertension-BP today 146/64.  Better controlled at home.  140-150s over 80s Continue lisinopril to 40 mg daily Continue amlodipine, furosemide Start carvedilol  Heart healthy low-sodium diet-salty 6 given Increase physical activity as tolerated  Shortness of breath/HOCM-no increased  work of breathing or activity tolerance.  LVEF 65-70%, severe left concentric hypertrophy, G2 DD, left and right atrium mildly dilated.  Underwent ICD implantation 03/25/2020 Continue lisinopril, amlodipine, carvedilol furosemide Heart healthy low-sodium diet-salty 6 given Increase physical activity as tolerated  Tobacco abuse-has been working towards smoking cessation and smoking much less.  Down to 4 cigarettes/day Smoking cessation again encouraged.  Morbid obesity-weight today 292.8 pounds. Continue weight loss Heart healthy low-sodium diet Increase physical activity as tolerated  Disposition: Follow-up with me in 1 month virtually and Dr. Oval Linsey in 3-4 months   Jossie Ng. Stanley Lyness NP-C    04/14/2020, 9:07 AM [place] [address] Office [phone] Fax [phone]  Notice: This dictation was prepared with Dragon dictation along with smaller phrase technology. Any transcriptional errors that result from this process are unintentional and may not be corrected upon review.  I spent 14 minutes examining this patient, reviewing medications, and using patient centered shared decision making involving her cardiac care.  Prior to her visit I spent greater than 20 minutes reviewing her past medical history,  medications, and prior cardiac tests.

## 2020-04-14 ENCOUNTER — Ambulatory Visit (INDEPENDENT_AMBULATORY_CARE_PROVIDER_SITE_OTHER): Payer: 59 | Admitting: General Practice

## 2020-04-14 ENCOUNTER — Encounter: Payer: Self-pay | Admitting: General Practice

## 2020-04-14 ENCOUNTER — Other Ambulatory Visit: Payer: Self-pay | Admitting: Nurse Practitioner

## 2020-04-14 ENCOUNTER — Other Ambulatory Visit: Payer: Self-pay

## 2020-04-14 VITALS — BP 146/64 | HR 76 | Wt 282.8 lb

## 2020-04-14 DIAGNOSIS — I10 Essential (primary) hypertension: Secondary | ICD-10-CM

## 2020-04-14 DIAGNOSIS — I480 Paroxysmal atrial fibrillation: Secondary | ICD-10-CM | POA: Diagnosis not present

## 2020-04-14 DIAGNOSIS — R0602 Shortness of breath: Secondary | ICD-10-CM | POA: Diagnosis not present

## 2020-04-14 DIAGNOSIS — M79642 Pain in left hand: Secondary | ICD-10-CM

## 2020-04-14 DIAGNOSIS — Z720 Tobacco use: Secondary | ICD-10-CM

## 2020-04-14 DIAGNOSIS — M545 Low back pain, unspecified: Secondary | ICD-10-CM

## 2020-04-14 DIAGNOSIS — M79641 Pain in right hand: Secondary | ICD-10-CM

## 2020-04-14 DIAGNOSIS — I421 Obstructive hypertrophic cardiomyopathy: Secondary | ICD-10-CM | POA: Diagnosis not present

## 2020-04-14 DIAGNOSIS — G8929 Other chronic pain: Secondary | ICD-10-CM

## 2020-04-14 NOTE — Patient Instructions (Signed)
Medication Instructions:  STOP METOPROLOL  START CARVEDILOL 25MG  TWICE DAILY  *If you need a refill on your cardiac medications before your next appointment, please call your pharmacy*  Lab Work:   Testing/Procedures:  NONE    NONE  Special Instructions PLEASE READ AND FOLLOW SALTY 6-ATTACHED-1,800mg  daily   PLEASE INCREASE PHYSICAL ACTIVITY AS TOLERATED  PLEASE PURCHASE AND WEAR COMPRESSION STOCKINGS DAILY AND TAKE OFF AT BEDTIME. Compression stockings are elastic socks that squeeze the legs. They help to increase blood flow to the legs and to decrease swelling in the legs from fluid retention, and reduce the chance of developing blood clots in the lower legs. Please put on in the AM when dressing and off at night when dressing for bed.  LET THEM KNOW THAT YOU NEED KNEE HIGH'S WITH COMPRESSION OF 15-20 mmhg. [employer]  [place] ([phone]); [place], [place] [address]; [phone]  EMAIL   [email or web address].  PLEASE MAKE SURE TO ELEVATE YOUR FEET & LEGS WHILE SITTING, THIS WILL HELP WITH THE SWELLING ALSO.    Follow-Up: Your next appointment:  3 month(s) Virtual Visit  with Skeet Latch, MD OR IF UNAVAILABLE JESSE CLEAVER, FNP-C At [facility], you and your health needs are our priority.  As part of our continuing mission to provide you with exceptional heart care, we have created designated Provider Care Teams.  These Care Teams include your primary Cardiologist (physician) and Advanced Practice Providers (APPs -  Physician Assistants and Nurse Practitioners) who all work together to provide you with the care you need, when you need it.            6 SALTY THINGS TO AVOID     1,800MG  DAILY

## 2020-04-18 ENCOUNTER — Telehealth: Payer: Self-pay

## 2020-04-18 NOTE — Telephone Encounter (Signed)
I called patient to see if she has received her Home INR monitor or at least received a call regarding one. Left her a vm to call the office [facility]

## 2020-05-09 ENCOUNTER — Other Ambulatory Visit: Payer: Self-pay | Admitting: Nurse Practitioner

## 2020-05-09 DIAGNOSIS — M25561 Pain in right knee: Secondary | ICD-10-CM

## 2020-05-09 DIAGNOSIS — M545 Low back pain, unspecified: Secondary | ICD-10-CM

## 2020-05-27 ENCOUNTER — Other Ambulatory Visit: Payer: Self-pay | Admitting: Nurse Practitioner

## 2020-06-13 ENCOUNTER — Other Ambulatory Visit: Payer: Self-pay | Admitting: Nurse Practitioner

## 2020-06-13 DIAGNOSIS — R7309 Other abnormal glucose: Secondary | ICD-10-CM

## 2020-06-13 DIAGNOSIS — G8929 Other chronic pain: Secondary | ICD-10-CM

## 2020-06-13 DIAGNOSIS — M25561 Pain in right knee: Secondary | ICD-10-CM

## 2020-06-14 ENCOUNTER — Other Ambulatory Visit: Payer: Self-pay | Admitting: Cardiovascular Disease

## 2020-06-14 ENCOUNTER — Encounter: Payer: Self-pay | Admitting: Cardiology

## 2020-06-14 NOTE — Telephone Encounter (Signed)
error 

## 2020-06-30 ENCOUNTER — Ambulatory Visit (INDEPENDENT_AMBULATORY_CARE_PROVIDER_SITE_OTHER): Payer: 59

## 2020-06-30 DIAGNOSIS — I421 Obstructive hypertrophic cardiomyopathy: Secondary | ICD-10-CM

## 2020-06-30 LAB — CUP PACEART REMOTE DEVICE CHECK
Battery Remaining Longevity: 156 mo
Battery Remaining Percentage: 100 %
Brady Statistic RA Percent Paced: 16 %
Brady Statistic RV Percent Paced: 0 %
Date Time Interrogation Session: 20220623005100
HighPow Impedance: 63 Ohm
Implantable Lead Implant Date: 20220318
Implantable Lead Implant Date: 20220318
Implantable Lead Location: 753859
Implantable Lead Location: 753860
Implantable Lead Model: 673
Implantable Lead Model: 7841
Implantable Lead Serial Number: 1122351
Implantable Lead Serial Number: 158410
Implantable Pulse Generator Implant Date: 20220318
Lead Channel Impedance Value: 407 Ohm
Lead Channel Impedance Value: 645 Ohm
Lead Channel Setting Pacing Amplitude: 3.5 V
Lead Channel Setting Pacing Amplitude: 3.5 V
Lead Channel Setting Pacing Pulse Width: 0.4 ms
Lead Channel Setting Sensing Sensitivity: 0.5 mV
Pulse Gen Serial Number: 593724

## 2020-06-30 NOTE — Progress Notes (Signed)
Electrophysiology Office Follow up Visit Note:    Date:  07/01/2020   ID:  Alexandra Bell, DOB [birth year], MRN [id number]  PCP:  [name], [facility] Cardiologist:  Skeet Latch, MD  [facility] Electrophysiologist:  Vickie Epley, MD    Interval History:    Alexandra Bell is a 55 y.o. female who presents for a follow up visit after an ICD implant March 25, 2020 for hypertrophic cardiomyopathy.  She also has a history of atrial fibrillation.  She has done well since the device implant.  Today she tells me she has been doing very well overall.  The ICD site has healed well.     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   Fatty liver 11/2010   GERD (gastroesophageal reflux disease)    HOCM (hypertrophic obstructive cardiomyopathy) ([place]) 12/10/2019   Hx: UTI (urinary tract infection)    Hypertension    Inguinal hernia    Morbid obesity ([place]) 12/10/2019   NSVT (nonsustained ventricular tachycardia) ([place]) 03/10/2020   Obesity, Class III, BMI 40-49.9 (morbid obesity) ([place])    Periumbilical hernia     Past Surgical History:  Procedure Laterality Date   ABDOMINAL HYSTERECTOMY     BRONCHOSCOPY, LYMPH NODE BX  MAY 2011   CHOLECYSTECTOMY  2001   ERCP/SPHINCTEROTOMY/STONE EXTRACTION  2001   FLEXIBLE SIGMOIDOSCOPY  11/29/2010   Procedure: FLEXIBLE SIGMOIDOSCOPY;  Surgeon: Lafayette Dragon, MD;  Location: [facility] ENDOSCOPY;  Service: Endoscopy;  Laterality: N/A;   ICD IMPLANT N/A 03/25/2020   Procedure: ICD IMPLANT;  Surgeon: Vickie Epley, MD;  Location: [facility] CV LAB;  Service: Cardiovascular;  Laterality: N/A;    Current Medications: Current Meds  Medication Sig   acetaminophen (TYLENOL) 500 MG tablet Take 1,500 mg by mouth every 6 (six) hours as needed (For pain).   albuterol  (PROVENTIL HFA;VENTOLIN HFA) 108 (90 BASE) MCG/ACT inhaler Inhale 2 puffs into the lungs every 6 (six) hours as needed. For asthma   amLODipine (NORVASC) 10 MG tablet Take 1 tablet (10 mg total) by mouth daily.   apixaban (ELIQUIS) 5 MG TABS tablet Take 1 tablet (5 mg total) by mouth 2 (two) times daily.   aspirin EC 81 MG tablet Take 81 mg by mouth every morning.   diclofenac Sodium (VOLTAREN) 1 % GEL APPLY 2 G TOPICALLY FOUR TIMES DAILY.   Diclofenac Sodium CR 100 MG 24 hr tablet Take 100 mg by mouth every morning.   FARXIGA 5 MG TABS tablet TAKE 1 TABLET BY MOUTH ONCE DAILY BEFORE BREAKFAST   furosemide (LASIX) 80 MG tablet Take 1 tablet (80 mg total) by mouth daily.   gabapentin (NEURONTIN) 300 MG capsule TAKE 1 CAPSULE BY MOUTH THREE TIMES DAILY   Magnesium 250 MG TABS Take 250 mg by mouth daily at 2 PM.   melatonin 5 MG TABS Take 5 mg by mouth daily at 2 PM.   metFORMIN (GLUCOPHAGE) 500 MG tablet TAKE 1 TABLET BY MOUTH TWICE DAILY WITH A MEAL   Multiple Vitamin (MULTIVITAMIN WITH MINERALS) TABS tablet Take 1 tablet by mouth in the morning. One-A-Day Women's Multivitamin   [DISCONTINUED] amLODipine (NORVASC) 5 MG tablet Take 1 tablet by mouth once daily     Allergies:   Patient has no known allergies.  Social History   Socioeconomic History   Marital status: Married    Spouse name: Not on file   Number of children: 2   Years of education: Not on file   Highest education level: Not on file  Occupational History   Occupation: Food prep, Metallurgist: UNEMPLOYED  Tobacco Use   Smoking status: Every Day    Packs/day: 0.50    Years: 30.00    Pack years: 15.00    Types: Cigarettes   Smokeless tobacco: Never   Tobacco comments:    she is less than a PPD - states down to 6 cigarettes a day - 10/14  Vaping Use   Vaping Use: Never used  Substance and Sexual Activity   Alcohol use: No   Drug use: No   Sexual activity: Not on file  Other Topics Concern   Not on file   Social History Narrative   Not on file   Social Determinants of Health   Financial Resource Strain: Not on file  Food Insecurity: Not on file  Transportation Needs: Not on file  Physical Activity: Not on file  Stress: Not on file  Social Connections: Not on file     Family History: The patient's family history includes Colon cancer in her father; Diabetes in her mother and sister; Heart attack in her father.  ROS:   Please see the history of present illness.    All other systems reviewed and are negative.  EKGs/Labs/Other Studies Reviewed:    The following studies were reviewed today:  July 01, 2020 device interrogation in clinic personally reviewed Lead parameter stable. Battery longevity expected. Burden of atrial fibrillation 1% Lead outputs reduced given interval since implant thanks    01/19/2020 Cardiac MRI IMPRESSION: 1. Normal LV size with severe asymmetric basal to mid septal hypertrophy, EF 56% with normal wall motion. Very mild mitral valve SAM without significant regurgitation. The LGE pattern and low ECV percentage are suggestive of hypertrophic cardiomyopathy. The extensive LGE does suggest a higher risk for ventricular arrhythmias.   2.  Normal right ventricular size and systolic function.   This study is suggestive of hypertrophic cardiomyopathy.  EKG:  The ekg ordered today demonstrates sinus rhythm.  Recent Labs: 01/28/2020: BNP 893.9 03/23/2020: BUN 15; Creatinine, Ser 0.75; Hemoglobin 12.2; Platelets 296; Potassium 5.0; Sodium 143  Recent Lipid Panel    Component Value Date/Time   CHOL 188 05/07/2019 1542   TRIG 123 05/07/2019 1542   HDL 61 05/07/2019 1542   CHOLHDL 3.1 05/07/2019 1542   LDLCALC 105 (H) 05/07/2019 1542    Physical Exam:    VS:  BP (!) 148/74   Pulse 70   Ht 5\' 6"  (1.676 m)   Wt 286 lb (129.7 kg)   SpO2 98%   BMI 46.16 kg/m     Wt Readings from Last 3 Encounters:  07/01/20 286 lb (129.7 kg)  04/14/20 282 lb  12.8 oz (128.3 kg)  03/25/20 285 lb (129.3 kg)     GEN:  Well nourished, well developed in no acute distress.  Obese HEENT: Normal NECK: No JVD; No carotid bruits LYMPHATICS: No lymphadenopathy CARDIAC: RRR, no murmurs, rubs, gallops.  ICD pocket well-healed. RESPIRATORY:  Clear to auscultation without rales, wheezing or rhonchi  ABDOMEN: Soft, non-tender, non-distended MUSCULOSKELETAL:  No edema; No deformity  SKIN: Warm and dry NEUROLOGIC:  Alert and oriented x 3 PSYCHIATRIC:  Normal affect   ASSESSMENT:    1. HOCM (hypertrophic obstructive cardiomyopathy) ([place])  2. ICD (implantable cardioverter-defibrillator) in place   3. Morbid obesity ([place])   4. Essential hypertension    PLAN:    In order of problems listed above:  1. HOCM (hypertrophic obstructive cardiomyopathy) ([place]) Doing well without obstructive symptoms.  ICD functioning well.  2. ICD (implantable cardioverter-defibrillator) in place Device functioning well.  Incision well-healed.  Continue remote monitoring next field Lead outputs adjusted today to maximize battery longevity.  3. Morbid obesity ([place]) Weight loss encouraged  4. Essential hypertension Above goal today.  We will increase the amlodipine to 10 mg by mouth once daily.  She will have follow-up with Dr. Oval Linsey.   I will plan on seeing her back in clinic in 9 months.  We will continue remote monitoring in the interim.   Medication Adjustments/Labs and Tests Ordered: Current medicines are reviewed at length with the patient today.  Concerns regarding medicines are outlined above.  Orders Placed This Encounter  Procedures   EKG 12-Lead    Meds ordered this encounter  Medications   amLODipine (NORVASC) 10 MG tablet    Sig: Take 1 tablet (10 mg total) by mouth daily.    Dispense:  90 tablet    Refill:  3      Signed, Lars Mage, MD, [facility], [facility] 07/01/2020 8:36 AM    Electrophysiology [facility]

## 2020-07-01 ENCOUNTER — Ambulatory Visit (INDEPENDENT_AMBULATORY_CARE_PROVIDER_SITE_OTHER): Payer: Self-pay | Admitting: Cardiology

## 2020-07-01 ENCOUNTER — Encounter: Payer: Self-pay | Admitting: Cardiology

## 2020-07-01 ENCOUNTER — Other Ambulatory Visit: Payer: Self-pay

## 2020-07-01 VITALS — BP 148/74 | HR 70 | Ht 66.0 in | Wt 286.0 lb

## 2020-07-01 DIAGNOSIS — I10 Essential (primary) hypertension: Secondary | ICD-10-CM

## 2020-07-01 DIAGNOSIS — Z95810 Presence of automatic (implantable) cardiac defibrillator: Secondary | ICD-10-CM

## 2020-07-01 DIAGNOSIS — I421 Obstructive hypertrophic cardiomyopathy: Secondary | ICD-10-CM

## 2020-07-01 MED ORDER — AMLODIPINE BESYLATE 10 MG PO TABS: 10.0000 mg | ORAL_TABLET | Freq: Every day | ORAL | 3 refills | Status: DC

## 2020-07-01 NOTE — Patient Instructions (Signed)
Medication Instructions:  Your physician has recommended you make the following change in your medication:    Increase your amlodipine-  Take 10 mg by mouth once a day  *If you need a refill on your cardiac medications before your next appointment, please call your pharmacy*  Lab Work: None ordered. If you have labs (blood work) drawn today and your tests are completely normal, you will receive your results only by: [place] (if you have MyChart) OR A paper copy in the mail If you have any lab test that is abnormal or we need to change your treatment, we will call you to review the results.  Testing/Procedures: None ordered.  Follow-Up: At [facility], you and your health needs are our priority.  As part of our continuing mission to provide you with exceptional heart care, we have created designated Provider Care Teams.  These Care Teams include your primary Cardiologist (physician) and Advanced Practice Providers (APPs -  Physician Assistants and Nurse Practitioners) who all work together to provide you with the care you need, when you need it.  Your next appointment:   Your physician wants you to follow-up in: 9 months with Dr. Quentin Ore.   You will receive a reminder letter in the mail two months in advance. If you don't receive a letter, please call our office to schedule the follow-up appointment.  Remote monitoring is used to monitor your ICD from home. This monitoring reduces the number of office visits required to check your device to one time per year. It allows [place] to keep an eye on the functioning of your device to ensure it is working properly. You are scheduled for a device check from home on 09/29/2020. You may send your transmission at any time that day. If you have a wireless device, the transmission will be sent automatically. After your physician reviews your transmission, you will receive a postcard with your next transmission date.

## 2020-07-11 ENCOUNTER — Other Ambulatory Visit: Payer: Self-pay | Admitting: Nurse Practitioner

## 2020-07-11 DIAGNOSIS — M79641 Pain in right hand: Secondary | ICD-10-CM

## 2020-07-11 DIAGNOSIS — M545 Low back pain, unspecified: Secondary | ICD-10-CM

## 2020-07-19 NOTE — Progress Notes (Signed)
Remote ICD transmission.   

## 2020-07-28 ENCOUNTER — Other Ambulatory Visit: Payer: Self-pay

## 2020-07-28 ENCOUNTER — Ambulatory Visit (INDEPENDENT_AMBULATORY_CARE_PROVIDER_SITE_OTHER): Payer: 59 | Admitting: Nurse Practitioner

## 2020-07-28 ENCOUNTER — Encounter: Payer: Self-pay | Admitting: Nurse Practitioner

## 2020-07-28 VITALS — BP 134/86 | HR 73 | Temp 98.5°F | Ht 66.0 in | Wt 279.0 lb

## 2020-07-28 DIAGNOSIS — E662 Morbid (severe) obesity with alveolar hypoventilation: Secondary | ICD-10-CM

## 2020-07-28 DIAGNOSIS — M25562 Pain in left knee: Secondary | ICD-10-CM

## 2020-07-28 DIAGNOSIS — R7309 Other abnormal glucose: Secondary | ICD-10-CM | POA: Diagnosis not present

## 2020-07-28 DIAGNOSIS — Z1211 Encounter for screening for malignant neoplasm of colon: Secondary | ICD-10-CM

## 2020-07-28 DIAGNOSIS — Z6841 Body Mass Index (BMI) 40.0 and over, adult: Secondary | ICD-10-CM

## 2020-07-28 DIAGNOSIS — I10 Essential (primary) hypertension: Secondary | ICD-10-CM

## 2020-07-28 DIAGNOSIS — Z23 Encounter for immunization: Secondary | ICD-10-CM

## 2020-07-28 DIAGNOSIS — M25561 Pain in right knee: Secondary | ICD-10-CM

## 2020-07-28 DIAGNOSIS — S81801A Unspecified open wound, right lower leg, initial encounter: Secondary | ICD-10-CM

## 2020-07-28 MED ORDER — DICLOFENAC SODIUM ER 100 MG PO TB24: 100.0000 mg | ORAL_TABLET | Freq: Every morning | ORAL | 1 refills | Status: DC

## 2020-07-28 NOTE — Progress Notes (Signed)
I,Yamilka [employer] as a Education administrator for [employer], FNP.,have documented all relevant documentation on the behalf of [name], FNP,as directed by  [name], FNP while in the presence of [name], [place].  This visit occurred during the SARS-CoV-2 public health emergency.  Safety protocols were in place, including screening questions prior to the visit, additional usage of staff PPE, and extensive cleaning of exam room while observing appropriate contact time as indicated for disinfecting solutions.  Subjective:     Patient ID: Alexandra Bell , female    DOB: 09-Feb-1965 , 55 y.o.   MRN: [id number]   Chief Complaint  Patient presents with   Hypertension    HPI  Patient here for a blood pressure. She had an implantable device placed - pacemaker  Wt Readings from Last 3 Encounters: 07/28/20 : 279 lb (126.6 kg) 07/01/20 : 286 lb (129.7 kg) 04/14/20 : 282 lb 12.8 oz (128.3 kg)    She has cut back on her bread to once a week, she has also cut back on other bad foods as well. She is doing a stepping exercise and occasionally will do the treadmill.   Hypertension This is a chronic problem. The current episode started more than 1 year ago. The problem is uncontrolled. Pertinent negatives include no anxiety, chest pain, headaches or palpitations. Risk factors for coronary artery disease include sedentary lifestyle and obesity. Past treatments include ACE inhibitors and diuretics. There are no compliance problems.  There is no history of angina or kidney disease. There is no history of chronic renal disease.    Past Medical History:  Diagnosis Date   Acute renal failure (HCC)    Arthritis    Asthma    Colitis, eosinophilic 82/09/5619   Common bile duct stone    Eosinophilia 3/0/8657   Eosinophilic enteritis 84/6962   Fatty liver 11/2010   GERD (gastroesophageal reflux disease)    HOCM (hypertrophic obstructive cardiomyopathy) ([place]) 12/10/2019   Hx: UTI (urinary tract  infection)    Hypertension    Inguinal hernia    Morbid obesity ([place]) 12/10/2019   NSVT (nonsustained ventricular tachycardia) ([place]) 03/10/2020   Obesity, Class III, BMI 40-49.9 (morbid obesity) ([place])    Periumbilical hernia      Family History  Problem Relation Age of Onset   Diabetes Mother    Colon cancer Father        ?   Heart attack Father    Diabetes Sister      Current Outpatient Medications:    acetaminophen (TYLENOL) 500 MG tablet, Take 1,500 mg by mouth every 6 (six) hours as needed (For pain)., Disp: , Rfl:    albuterol (PROVENTIL HFA;VENTOLIN HFA) 108 (90 BASE) MCG/ACT inhaler, Inhale 2 puffs into the lungs every 6 (six) hours as needed. For asthma, Disp: 1 Inhaler, Rfl: 3   amLODipine (NORVASC) 10 MG tablet, Take 1 tablet (10 mg total) by mouth daily., Disp: 90 tablet, Rfl: 3   apixaban (ELIQUIS) 5 MG TABS tablet, Take 1 tablet (5 mg total) by mouth 2 (two) times daily., Disp: 60 tablet, Rfl: 6   aspirin EC 81 MG tablet, Take 81 mg by mouth every morning., Disp: , Rfl:    carvedilol (COREG) 25 MG tablet, Take 1 tablet (25 mg total) by mouth 2 (two) times daily., Disp: 180 tablet, Rfl: 3   diclofenac Sodium (VOLTAREN) 1 % GEL, APPLY 2 G TOPICALLY FOUR TIMES DAILY., Disp: 400 g, Rfl: 0   FARXIGA 5 MG TABS  tablet, TAKE 1 TABLET BY MOUTH ONCE DAILY BEFORE BREAKFAST, Disp: 30 tablet, Rfl: 0   furosemide (LASIX) 80 MG tablet, Take 1 tablet (80 mg total) by mouth daily., Disp: 90 tablet, Rfl: 1   gabapentin (NEURONTIN) 300 MG capsule, TAKE 1 CAPSULE BY MOUTH THREE TIMES DAILY, Disp: 240 capsule, Rfl: 0   lisinopril (ZESTRIL) 40 MG tablet, Take 1 tablet (40 mg total) by mouth daily. (Patient taking differently: Take by mouth every morning.), Disp: 30 tablet, Rfl: 6   Magnesium 250 MG TABS, Take 250 mg by mouth daily at 2 PM., Disp: , Rfl:    melatonin 5 MG TABS, Take 5 mg by mouth daily at 2 PM., Disp: , Rfl:    metFORMIN (GLUCOPHAGE) 500 MG tablet, TAKE 1 TABLET BY MOUTH TWICE  DAILY WITH A MEAL, Disp: 180 tablet, Rfl: 0   Multiple Vitamin (MULTIVITAMIN WITH MINERALS) TABS tablet, Take 1 tablet by mouth in the morning. One-A-Day Women's Multivitamin, Disp: , Rfl:    Diclofenac Sodium CR 100 MG 24 hr tablet, Take 1 tablet (100 mg total) by mouth every morning., Disp: 90 tablet, Rfl: 1   No Known Allergies   Review of Systems  Constitutional: Negative.   Respiratory: Negative.  Negative for wheezing.   Cardiovascular:  Negative for chest pain, palpitations and leg swelling (both legs swelling).  Neurological:  Negative for dizziness and headaches.  Psychiatric/Behavioral: Negative.      Today's Vitals   07/28/20 1002  BP: 134/86  Pulse: 73  Temp: 98.5 F (36.9 C)  Weight: 279 lb (126.6 kg)  Height: _0  (1.676 m)  PainSc: 0-No pain   Body mass index is 45.03 kg/m.   Objective:  Physical Exam Vitals reviewed.  Constitutional:      General: She is not in acute distress.    Appearance: Normal appearance. She is well-developed. She is obese.  HENT:     Head: Normocephalic and atraumatic.  Eyes:     Pupils: Pupils are equal, round, and reactive to light.  Cardiovascular:     Rate and Rhythm: Normal rate and regular rhythm.     Pulses: Normal pulses.     Heart sounds: Normal heart sounds. No murmur heard. Pulmonary:     Effort: Pulmonary effort is normal. No respiratory distress.     Breath sounds: Normal breath sounds. No wheezing.  Musculoskeletal:        General: Tenderness (bilateral knees with movement) present. No swelling. Normal range of motion.  Skin:    General: Skin is warm and dry.     Capillary Refill: Capillary refill takes less than 2 seconds.     Comments: Right lower extremity with open area with yellow cratered center, hyperpigmented around the area.  Neurological:     General: No focal deficit present.     Mental Status: She is alert and oriented to person, place, and time.     Cranial Nerves: No cranial nerve deficit.      Motor: No weakness.  Psychiatric:        Mood and Affect: Mood normal.        Behavior: Behavior normal.        Thought Content: Thought content normal.        Judgment: Judgment normal.        Assessment And Plan:     1. Essential hypertension Comments: Slightly elevated blood pressure today Continue current medications and follow up with Cardiology - CMP14+EGFR  2. Abnormal glucose Comments: Continue  with metformin, tolerating well Increase physical activity as tolerated - Hemoglobin A1c  3. Class 3 obesity with alveolar hypoventilation and body mass index (BMI) of 45.0 to 49.9 in adult, unspecified whether serious comorbidity present (HCC) Chronic Discussed healthy diet and regular exercise options  Encouraged to exercise at least 150 minutes per week with 2 days of strength training  4. Encounter for screening colonoscopy - Ambulatory referral to Gastroenterology  5. Encounter for immunization - Varicella-zoster vaccine IM (Shingrix)  6. Wound of right lower extremity, initial encounter Comments: She has open wound concerning for vascular ulcer, will check venous reflux study - VAS [place] LOWER EXTREMITY VENOUS REFLUX; Future  7. Arthralgia of both knees Comments: She is not interested in seeing orthopedics at this time Discussed weight loss to help with pain - Diclofenac Sodium CR 100 MG 24 hr tablet; Take 1 tablet (100 mg total) by mouth every morning.  Dispense: 90 tablet; Refill: 1    Patient was given opportunity to ask questions. Patient verbalized understanding of the plan and was able to repeat key elements of the plan. All questions were answered to their satisfaction.  [name], FNP   I, [name], FNP, have reviewed all documentation for this visit. The documentation on 08/18/20 for the exam, diagnosis, procedures, and orders are all accurate and complete.   IF YOU HAVE BEEN REFERRED TO A SPECIALIST, IT MAY TAKE 1-2 WEEKS TO SCHEDULE/PROCESS THE  REFERRAL. IF YOU HAVE NOT HEARD FROM US/SPECIALIST IN TWO WEEKS, PLEASE GIVE [place] A CALL AT [phone].   THE PATIENT IS ENCOURAGED TO PRACTICE SOCIAL DISTANCING DUE TO THE COVID-19 PANDEMIC.

## 2020-07-28 NOTE — Patient Instructions (Signed)

## 2020-07-29 LAB — CMP14+EGFR
ALT: 7 IU/L (ref 0–32)
AST: 9 IU/L (ref 0–40)
Albumin/Globulin Ratio: 1.5 (ref 1.2–2.2)
Albumin: 3.8 g/dL (ref 3.8–4.9)
Alkaline Phosphatase: 76 IU/L (ref 44–121)
BUN/Creatinine Ratio: 17 (ref 9–23)
BUN: 13 mg/dL (ref 6–24)
Bilirubin Total: <0.2 mg/dL (ref 0.0–1.2)
CO2: 27 mmol/L (ref 20–29)
Calcium: 9.8 mg/dL (ref 8.7–10.2)
Chloride: 105 mmol/L (ref 96–106)
Creatinine, Ser: 0.78 mg/dL (ref 0.57–1.00)
Globulin, Total: 2.6 g/dL (ref 1.5–4.5)
Glucose: 91 mg/dL (ref 65–99)
Potassium: 4.8 mmol/L (ref 3.5–5.2)
Sodium: 143 mmol/L (ref 134–144)
Total Protein: 6.4 g/dL (ref 6.0–8.5)
eGFR: 89 mL/min/{1.73_m2} (ref >59)

## 2020-07-29 LAB — HEMOGLOBIN A1C
Est. average glucose Bld gHb Est-mCnc: 111 mg/dL
Hgb A1c MFr Bld: 5.5 % (ref 4.8–5.6)

## 2020-08-02 ENCOUNTER — Encounter (HOSPITAL_BASED_OUTPATIENT_CLINIC_OR_DEPARTMENT_OTHER): Payer: 59 | Admitting: Cardiovascular Disease

## 2020-08-02 NOTE — Progress Notes (Signed)
This encounter was created in error - please disregard.

## 2020-09-06 ENCOUNTER — Other Ambulatory Visit: Payer: Self-pay | Admitting: General Practice

## 2020-09-07 ENCOUNTER — Other Ambulatory Visit: Payer: Self-pay

## 2020-09-07 ENCOUNTER — Ambulatory Visit (INDEPENDENT_AMBULATORY_CARE_PROVIDER_SITE_OTHER): Payer: 59 | Admitting: Nurse Practitioner

## 2020-09-07 ENCOUNTER — Encounter: Payer: Self-pay | Admitting: Nurse Practitioner

## 2020-09-07 VITALS — Temp 98.1°F | Ht 66.0 in | Wt 276.0 lb

## 2020-09-07 DIAGNOSIS — I421 Obstructive hypertrophic cardiomyopathy: Secondary | ICD-10-CM

## 2020-09-07 DIAGNOSIS — R5383 Other fatigue: Secondary | ICD-10-CM | POA: Diagnosis not present

## 2020-09-07 DIAGNOSIS — Z720 Tobacco use: Secondary | ICD-10-CM

## 2020-09-07 DIAGNOSIS — R42 Dizziness and giddiness: Secondary | ICD-10-CM

## 2020-09-07 DIAGNOSIS — R0609 Other forms of dyspnea: Secondary | ICD-10-CM

## 2020-09-07 DIAGNOSIS — R600 Localized edema: Secondary | ICD-10-CM

## 2020-09-07 DIAGNOSIS — I83009 Varicose veins of unspecified lower extremity with ulcer of unspecified site: Secondary | ICD-10-CM

## 2020-09-07 DIAGNOSIS — R0600 Dyspnea, unspecified: Secondary | ICD-10-CM

## 2020-09-07 DIAGNOSIS — L97909 Non-pressure chronic ulcer of unspecified part of unspecified lower leg with unspecified severity: Secondary | ICD-10-CM

## 2020-09-07 MED ORDER — DAPAGLIFLOZIN PROPANEDIOL 10 MG PO TABS: 10.0000 mg | ORAL_TABLET | Freq: Every day | ORAL | 3 refills | Status: DC

## 2020-09-07 MED ORDER — ALBUTEROL SULFATE HFA 108 (90 BASE) MCG/ACT IN AERS: 2.0000 | INHALATION_SPRAY | Freq: Four times a day (QID) | RESPIRATORY_TRACT | 3 refills | Status: DC | PRN

## 2020-09-07 NOTE — Patient Instructions (Signed)
Call Dr Oval Linsey to schedule a Cardiology appt and call about getting the ultrasound on your leg check your voicemail

## 2020-09-07 NOTE — Progress Notes (Signed)
I,Alexandra Bell,acting as a Education administrator for Alexandra Brine, FNP.,have documented all relevant documentation on the behalf of Alexandra Brine, FNP,as directed by  Alexandra Brine, FNP while in the presence of Alexandra Bell, Alexandra Bell.  This visit occurred during the SARS-CoV-2 public health emergency.  Safety protocols were in place, including screening questions prior to the visit, additional usage of staff PPE, and extensive cleaning of exam room while observing appropriate contact time as indicated for disinfecting solutions.  Subjective:     Patient ID: Alexandra Bell , female    DOB: 1965/03/11 , 55 y.o.   MRN: [id number]   Chief Complaint  Patient presents with   Hypertension     HPI  Pt presents today complaining of having no energy. Daughter feels like she is lethargic and out of it, her speech is slow, dizziness and faint feeling. Unable to hold her eyes open.  It has been going on for 3 weeks, she does have slow speech, swollen legs, dizziness & blurred vision. She has been keeping up on her water intake at least 3-4 bottles a day.  She has a low appetite. She has been out of work 3 days in the last week. She has felt off balance. She has had several near falls. She is here today with her sister. She had the ICD implant done March or April.   She had her eye exam in January with new glasses. She feels like when she covers her left eye looks like a pile of mud in her eye since April or May.   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   Heart attack Father    Diabetes Sister      Current Outpatient Medications:    acetaminophen (TYLENOL) 500 MG tablet, Take 1,500 mg by mouth every 6 (six) hours as needed (For pain)., Disp: , Rfl:    amLODipine (NORVASC) 10 MG tablet, Take 1 tablet (10 mg total) by mouth daily., Disp: 90 tablet, Rfl: 3   Diclofenac Sodium CR 100 MG 24 hr tablet, Take 1 tablet (100 mg total) by mouth every morning., Disp: 90 tablet, Rfl: 1   furosemide (LASIX) 80 MG tablet, Take 1 tablet (80 mg total) by mouth daily., Disp: 90 tablet, Rfl: 1   gabapentin (NEURONTIN) 300 MG capsule, TAKE 1 CAPSULE BY MOUTH THREE TIMES DAILY (Patient taking differently: Take 300 mg by mouth 3 (three) times daily.), Disp: 240 capsule, Rfl: 0   Magnesium 250 MG TABS, Take 250 mg by mouth daily at  2 PM., Disp: , Rfl:    melatonin 5 MG TABS, Take 5 mg by mouth daily at 2 PM., Disp: , Rfl:    Multiple Vitamin (MULTIVITAMIN WITH MINERALS) TABS tablet, Take 1 tablet by mouth in the morning. One-A-Day Women's Multivitamin, Disp: , Rfl:    albuterol (VENTOLIN HFA) 108 (90 Base) MCG/ACT inhaler, Inhale 2 puffs into the lungs every 6 (six) hours as needed. For asthma, Disp: 1 each, Rfl: 3   apixaban (ELIQUIS) 5 MG TABS tablet, Take 1 tablet by mouth twice daily, Disp: 180 tablet, Rfl: 1   carvedilol (COREG) 25 MG tablet, Take 1 tablet (25 mg total) by mouth 2 (two) times daily., Disp: 180 tablet, Rfl: 3   dapagliflozin propanediol (FARXIGA) 10 MG TABS tablet, Take 1 tablet (10 mg total) by mouth daily before breakfast., Disp: 30 tablet, Rfl: 3    diclofenac Sodium (VOLTAREN) 1 % GEL, APPLY 2 G TOPICALLY FOUR TIMES DAILY., Disp: 400 g, Rfl: 0   lisinopril (ZESTRIL) 40 MG tablet, Take 1 tablet (40 mg total) by mouth every morning., Disp: 30 tablet, Rfl: 6   metFORMIN (GLUCOPHAGE) 500 MG tablet, TAKE 1 TABLET BY MOUTH TWICE DAILY WITH A MEAL, Disp: 180 tablet, Rfl: 2   pantoprazole (PROTONIX) 40 MG tablet, Take 1 tablet (40 mg total) by mouth 2 (two) times daily., Disp: 60 tablet, Rfl: 1   No Known Allergies   Review of Systems  Constitutional: Negative.   Respiratory: Negative.    Cardiovascular: Negative.  Negative for leg swelling.  Gastrointestinal: Negative.   Neurological: Negative.   Psychiatric/Behavioral: Negative.      Today's Vitals   09/07/20 0932  Temp: 98.1 F (36.7 C)  SpO2: 92%  Weight: 276 lb (125.2 kg)  Height: '5\' 6"'$  (1.676 m)   Body mass index is 44.55 kg/m.  Wt Readings from Last 3 Encounters:  09/13/20 276 lb (125.2 kg)  09/07/20 276 lb (125.2 kg)  07/28/20 279 lb (126.6 kg)    Objective:  Physical Exam Vitals reviewed.  Constitutional:      General: She is not in acute distress.    Appearance: Normal appearance. She is well-developed. She is obese.  HENT:     Head: Normocephalic and atraumatic.  Eyes:     Pupils: Pupils are equal, round, and reactive to light.  Cardiovascular:     Rate and Rhythm: Normal rate and regular rhythm.     Pulses: Normal pulses.     Heart sounds: Normal heart sounds. No murmur heard. Pulmonary:     Effort: Pulmonary effort is normal. No respiratory distress.     Breath sounds: Normal breath sounds. No wheezing.  Musculoskeletal:        General: Tenderness (bilateral knees with movement) present. No swelling. Normal range of motion.  Skin:    General: Skin is warm and dry.     Capillary Refill: Capillary refill takes less than 2 seconds.     Comments: Right lower extremity with open area with yellow cratered center, hyperpigmented around the area.  Neurological:      General: No focal deficit present.     Mental Status: She is alert and oriented to person, place, and time.     Cranial Nerves: No cranial nerve deficit.     Motor: No weakness.  Psychiatric:        Mood and Affect: Mood normal.        Behavior: Behavior normal.        Thought Content: Thought  content normal.        Judgment: Judgment normal.        Assessment And Plan:     1. Fatigue, unspecified type Comments: Will check metabolic causes - CBC with Differential/Platelet - Vitamin B12 - TSH - Iron, TIBC and Ferritin Panel - EKG 12-Lead - DG Chest 2 View; Future  2. Dizziness Comments: orthostats are normal.  - CBC with Differential/Platelet - Vitamin B12 - TSH - Iron, TIBC and Ferritin Panel - EKG 12-Lead  3. Tobacco abuse Comments: Encouraged to quit smoking - CT CHEST LUNG CA SCREEN LOW DOSE W/O CM; Future  4. Lower extremity edema Comments: will check BNP to see for fluid overload Encouraged to elevate feet when possible Wear support socks - Brain natriuretic peptide  5. HOCM (hypertrophic obstructive cardiomyopathy) ([place]) Comments: She is encouraged to contact cardiology since having dyspnea on exertion and lower extremity edema - Brain natriuretic peptide  6. Dyspnea on exertion Comments: She is to go to [facility] for CXR - DG Chest 2 View; Future  7. Venous ulcer ([place]) Comments: Has seen vein and vascular for her wound, healing at this time. This may also be causing her lower extremity edema    Patient was given opportunity to ask questions. Patient verbalized understanding of the plan and was able to repeat key elements of the plan. All questions were answered to their satisfaction.  Alexandra Brine, FNP   I, Alexandra Brine, FNP, have reviewed all documentation for this visit. The documentation on 09/18/20 for the exam, diagnosis, procedures, and orders are all accurate and complete.   IF YOU HAVE BEEN REFERRED TO A SPECIALIST, IT MAY TAKE 1-2 WEEKS  TO SCHEDULE/PROCESS THE REFERRAL. IF YOU HAVE NOT HEARD FROM US/SPECIALIST IN TWO WEEKS, PLEASE GIVE [place] A CALL AT [phone] X [phone].   THE PATIENT IS ENCOURAGED TO PRACTICE SOCIAL DISTANCING DUE TO THE COVID-19 PANDEMIC.

## 2020-09-09 NOTE — Telephone Encounter (Signed)
Prescription refill request for Eliquis received. Last office visit:[place] 08/02/20 Scr:0.78 07/28/20 Age: 42fWeight:126.6kg

## 2020-09-11 ENCOUNTER — Encounter (HOSPITAL_COMMUNITY): Payer: Self-pay | Admitting: Emergency Medicine

## 2020-09-11 ENCOUNTER — Other Ambulatory Visit: Payer: Self-pay

## 2020-09-11 ENCOUNTER — Inpatient Hospital Stay (HOSPITAL_COMMUNITY)
Admission: EM | Admit: 2020-09-11 | Discharge: 2020-09-14 | DRG: 377 | Disposition: A | Discharge status: Home / Self Care | Payer: 59 | Attending: Internal Medicine | Admitting: Internal Medicine

## 2020-09-11 ENCOUNTER — Emergency Department (HOSPITAL_COMMUNITY): Payer: 59

## 2020-09-11 DIAGNOSIS — M1990 Unspecified osteoarthritis, unspecified site: Secondary | ICD-10-CM | POA: Diagnosis present

## 2020-09-11 DIAGNOSIS — K269 Duodenal ulcer, unspecified as acute or chronic, without hemorrhage or perforation: Secondary | ICD-10-CM | POA: Diagnosis not present

## 2020-09-11 DIAGNOSIS — J9601 Acute respiratory failure with hypoxia: Secondary | ICD-10-CM | POA: Diagnosis present

## 2020-09-11 DIAGNOSIS — F17210 Nicotine dependence, cigarettes, uncomplicated: Secondary | ICD-10-CM | POA: Diagnosis present

## 2020-09-11 DIAGNOSIS — I959 Hypotension, unspecified: Secondary | ICD-10-CM | POA: Diagnosis present

## 2020-09-11 DIAGNOSIS — I421 Obstructive hypertrophic cardiomyopathy: Secondary | ICD-10-CM | POA: Diagnosis present

## 2020-09-11 DIAGNOSIS — J45909 Unspecified asthma, uncomplicated: Secondary | ICD-10-CM | POA: Diagnosis present

## 2020-09-11 DIAGNOSIS — K640 First degree hemorrhoids: Secondary | ICD-10-CM | POA: Diagnosis present

## 2020-09-11 DIAGNOSIS — Z716 Tobacco abuse counseling: Secondary | ICD-10-CM

## 2020-09-11 DIAGNOSIS — I10 Essential (primary) hypertension: Secondary | ICD-10-CM | POA: Diagnosis present

## 2020-09-11 DIAGNOSIS — D509 Iron deficiency anemia, unspecified: Secondary | ICD-10-CM | POA: Diagnosis present

## 2020-09-11 DIAGNOSIS — E1142 Type 2 diabetes mellitus with diabetic polyneuropathy: Secondary | ICD-10-CM | POA: Diagnosis present

## 2020-09-11 DIAGNOSIS — K219 Gastro-esophageal reflux disease without esophagitis: Secondary | ICD-10-CM | POA: Diagnosis present

## 2020-09-11 DIAGNOSIS — D62 Acute posthemorrhagic anemia: Secondary | ICD-10-CM | POA: Diagnosis present

## 2020-09-11 DIAGNOSIS — K2970 Gastritis, unspecified, without bleeding: Secondary | ICD-10-CM | POA: Diagnosis present

## 2020-09-11 DIAGNOSIS — K760 Fatty (change of) liver, not elsewhere classified: Secondary | ICD-10-CM | POA: Diagnosis present

## 2020-09-11 DIAGNOSIS — Z7901 Long term (current) use of anticoagulants: Secondary | ICD-10-CM

## 2020-09-11 DIAGNOSIS — I110 Hypertensive heart disease with heart failure: Secondary | ICD-10-CM | POA: Diagnosis present

## 2020-09-11 DIAGNOSIS — K922 Gastrointestinal hemorrhage, unspecified: Secondary | ICD-10-CM | POA: Diagnosis present

## 2020-09-11 DIAGNOSIS — R609 Edema, unspecified: Secondary | ICD-10-CM | POA: Diagnosis not present

## 2020-09-11 DIAGNOSIS — Z6841 Body Mass Index (BMI) 40.0 and over, adult: Secondary | ICD-10-CM | POA: Diagnosis not present

## 2020-09-11 DIAGNOSIS — Z560 Unemployment, unspecified: Secondary | ICD-10-CM

## 2020-09-11 DIAGNOSIS — E119 Type 2 diabetes mellitus without complications: Secondary | ICD-10-CM

## 2020-09-11 DIAGNOSIS — Z95810 Presence of automatic (implantable) cardiac defibrillator: Secondary | ICD-10-CM | POA: Diagnosis not present

## 2020-09-11 DIAGNOSIS — E1140 Type 2 diabetes mellitus with diabetic neuropathy, unspecified: Secondary | ICD-10-CM | POA: Diagnosis present

## 2020-09-11 DIAGNOSIS — F17200 Nicotine dependence, unspecified, uncomplicated: Secondary | ICD-10-CM | POA: Diagnosis present

## 2020-09-11 DIAGNOSIS — Z79899 Other long term (current) drug therapy: Secondary | ICD-10-CM

## 2020-09-11 DIAGNOSIS — I5032 Chronic diastolic (congestive) heart failure: Secondary | ICD-10-CM | POA: Diagnosis present

## 2020-09-11 DIAGNOSIS — K264 Chronic or unspecified duodenal ulcer with hemorrhage: Secondary | ICD-10-CM | POA: Diagnosis present

## 2020-09-11 DIAGNOSIS — Z713 Dietary counseling and surveillance: Secondary | ICD-10-CM

## 2020-09-11 DIAGNOSIS — Z7984 Long term (current) use of oral hypoglycemic drugs: Secondary | ICD-10-CM

## 2020-09-11 DIAGNOSIS — Z9049 Acquired absence of other specified parts of digestive tract: Secondary | ICD-10-CM

## 2020-09-11 DIAGNOSIS — Z90710 Acquired absence of both cervix and uterus: Secondary | ICD-10-CM

## 2020-09-11 DIAGNOSIS — Z20822 Contact with and (suspected) exposure to covid-19: Secondary | ICD-10-CM | POA: Diagnosis present

## 2020-09-11 DIAGNOSIS — I480 Paroxysmal atrial fibrillation: Secondary | ICD-10-CM | POA: Diagnosis present

## 2020-09-11 DIAGNOSIS — Z7982 Long term (current) use of aspirin: Secondary | ICD-10-CM

## 2020-09-11 DIAGNOSIS — D649 Anemia, unspecified: Secondary | ICD-10-CM

## 2020-09-11 DIAGNOSIS — E66813 Obesity, class 3: Secondary | ICD-10-CM | POA: Diagnosis present

## 2020-09-11 HISTORY — DX: Gastrointestinal hemorrhage, unspecified: K92.2

## 2020-09-11 LAB — HEPATIC FUNCTION PANEL
ALT: 13 U/L (ref 0–44)
AST: 17 U/L (ref 15–41)
Albumin: 2.9 g/dL — ABNORMAL LOW (ref 3.5–5.0)
Alkaline Phosphatase: 44 U/L (ref 38–126)
Bilirubin, Direct: <0.1 mg/dL (ref 0.0–0.2)
Total Bilirubin: 0.5 mg/dL (ref 0.3–1.2)
Total Protein: 5.5 g/dL — ABNORMAL LOW (ref 6.5–8.1)

## 2020-09-11 LAB — BASIC METABOLIC PANEL
Anion gap: 8 (ref 5–15)
Anion gap: 4 — ABNORMAL LOW (ref 5–15)
BUN: 24 mg/dL — ABNORMAL HIGH (ref 6–20)
BUN: 24 mg/dL — ABNORMAL HIGH (ref 6–20)
CO2: 25 mmol/L (ref 22–32)
CO2: 26 mmol/L (ref 22–32)
Calcium: 9.8 mg/dL (ref 8.9–10.3)
Calcium: 9.5 mg/dL (ref 8.9–10.3)
Chloride: 105 mmol/L (ref 98–111)
Chloride: 109 mmol/L (ref 98–111)
Creatinine, Ser: 1 mg/dL (ref 0.44–1.00)
Creatinine, Ser: 0.95 mg/dL (ref 0.44–1.00)
GFR, Estimated: >60 mL/min (ref >60)
GFR, Estimated: >60 mL/min (ref >60)
Glucose, Bld: 105 mg/dL — ABNORMAL HIGH (ref 70–99)
Glucose, Bld: 93 mg/dL (ref 70–99)
Potassium: 4.5 mmol/L (ref 3.5–5.1)
Potassium: 4.6 mmol/L (ref 3.5–5.1)
Sodium: 138 mmol/L (ref 135–145)
Sodium: 139 mmol/L (ref 135–145)

## 2020-09-11 LAB — IRON AND TIBC
Iron: 21 ug/dL — ABNORMAL LOW (ref 28–170)
Saturation Ratios: 4 % — ABNORMAL LOW (ref 10.4–31.8)
TIBC: 515 ug/dL — ABNORMAL HIGH (ref 250–450)
UIBC: 494 ug/dL

## 2020-09-11 LAB — CBC WITH DIFFERENTIAL/PLATELET
Abs Immature Granulocytes: 0.05 10*3/uL (ref 0.00–0.07)
Basophils Absolute: 0.1 10*3/uL (ref 0.0–0.1)
Basophils Relative: 1 %
Eosinophils Absolute: 0.1 10*3/uL (ref 0.0–0.5)
Eosinophils Relative: 1 %
HCT: 16.7 % — ABNORMAL LOW (ref 36.0–46.0)
Hemoglobin: 4.5 g/dL — CL (ref 12.0–15.0)
Immature Granulocytes: 1 %
Lymphocytes Relative: 16 %
Lymphs Abs: 1.7 10*3/uL (ref 0.7–4.0)
MCH: 23.3 pg — ABNORMAL LOW (ref 26.0–34.0)
MCHC: 26.9 g/dL — ABNORMAL LOW (ref 30.0–36.0)
MCV: 86.5 fL (ref 80.0–100.0)
Monocytes Absolute: 0.7 10*3/uL (ref 0.1–1.0)
Monocytes Relative: 7 %
Neutro Abs: 8 10*3/uL — ABNORMAL HIGH (ref 1.7–7.7)
Neutrophils Relative %: 74 %
Platelets: 242 10*3/uL (ref 150–400)
RBC: 1.93 MIL/uL — ABNORMAL LOW (ref 3.87–5.11)
RDW: 16.3 % — ABNORMAL HIGH (ref 11.5–15.5)
WBC: 10.5 10*3/uL (ref 4.0–10.5)
nRBC: 0.7 % — ABNORMAL HIGH (ref 0.0–0.2)

## 2020-09-11 LAB — CBC
HCT: 18.6 % — ABNORMAL LOW (ref 36.0–46.0)
Hemoglobin: 5.2 g/dL — CL (ref 12.0–15.0)
MCH: 24.2 pg — ABNORMAL LOW (ref 26.0–34.0)
MCHC: 28 g/dL — ABNORMAL LOW (ref 30.0–36.0)
MCV: 86.5 fL (ref 80.0–100.0)
Platelets: 225 10*3/uL (ref 150–400)
RBC: 2.15 MIL/uL — ABNORMAL LOW (ref 3.87–5.11)
RDW: 16.1 % — ABNORMAL HIGH (ref 11.5–15.5)
WBC: 9.1 10*3/uL (ref 4.0–10.5)
nRBC: 0.4 % — ABNORMAL HIGH (ref 0.0–0.2)

## 2020-09-11 LAB — RESP PANEL BY RT-PCR (FLU A&B, COVID) ARPGX2
Influenza A by PCR: NEGATIVE
Influenza B by PCR: NEGATIVE
SARS Coronavirus 2 by RT PCR: NEGATIVE

## 2020-09-11 LAB — PROTIME-INR
INR: 1.3 — ABNORMAL HIGH (ref 0.8–1.2)
Prothrombin Time: 16.4 seconds — ABNORMAL HIGH (ref 11.4–15.2)

## 2020-09-11 LAB — LACTIC ACID, PLASMA: Lactic Acid, Venous: 1.1 mmol/L (ref 0.5–1.9)

## 2020-09-11 LAB — AMMONIA: Ammonia: 13 umol/L (ref 9–35)

## 2020-09-11 LAB — TSH: TSH: 1.842 u[IU]/mL (ref 0.350–4.500)

## 2020-09-11 LAB — VITAMIN B12: Vitamin B-12: 914 pg/mL (ref 180–914)

## 2020-09-11 LAB — RETICULOCYTES
Immature Retic Fract: 45.3 % — ABNORMAL HIGH (ref 2.3–15.9)
RBC.: 2.13 MIL/uL — ABNORMAL LOW (ref 3.87–5.11)
Retic Count, Absolute: 71.1 10*3/uL (ref 19.0–186.0)
Retic Ct Pct: 3.3 % — ABNORMAL HIGH (ref 0.4–3.1)

## 2020-09-11 LAB — GLUCOSE, CAPILLARY: Glucose-Capillary: 91 mg/dL (ref 70–99)

## 2020-09-11 LAB — BRAIN NATRIURETIC PEPTIDE: B Natriuretic Peptide: 1063.3 pg/mL — ABNORMAL HIGH (ref 0.0–100.0)

## 2020-09-11 LAB — PREPARE RBC (CROSSMATCH)

## 2020-09-11 LAB — FERRITIN: Ferritin: 6 ng/mL — ABNORMAL LOW (ref 11–307)

## 2020-09-11 LAB — T4, FREE: Free T4: 0.97 ng/dL (ref 0.61–1.12)

## 2020-09-11 MED ORDER — ONDANSETRON HCL 4 MG/2ML IJ SOLN: 4.0000 mg | Freq: Four times a day (QID) | INTRAMUSCULAR | Status: DC | PRN

## 2020-09-11 MED ORDER — INSULIN ASPART 100 UNIT/ML IJ SOLN: 0.0000 [IU] | Freq: Every day | INTRAMUSCULAR | Status: DC

## 2020-09-11 MED ORDER — PANTOPRAZOLE 80MG IVPB - SIMPLE MED
80.0000 mg | Freq: Once | INTRAVENOUS | Status: AC
  Administered 2020-09-11: 80 mg via INTRAVENOUS
  Filled 2020-09-11: qty 80

## 2020-09-11 MED ORDER — PANTOPRAZOLE SODIUM 40 MG IV SOLR: 40.0000 mg | Freq: Two times a day (BID) | INTRAVENOUS | Status: DC

## 2020-09-11 MED ORDER — SODIUM CHLORIDE 0.9 % IV SOLN: 10.0000 mL/h | Freq: Once | INTRAVENOUS | Status: DC

## 2020-09-11 MED ORDER — ALBUTEROL SULFATE (2.5 MG/3ML) 0.083% IN NEBU: 3.0000 mL | INHALATION_SOLUTION | Freq: Four times a day (QID) | RESPIRATORY_TRACT | Status: DC | PRN

## 2020-09-11 MED ORDER — PANTOPRAZOLE INFUSION (NEW) - SIMPLE MED
8.0000 mg/h | INTRAVENOUS | Status: DC
  Administered 2020-09-11 – 2020-09-12 (×2): 8 mg/h via INTRAVENOUS
  Filled 2020-09-11: qty 80
  Filled 2020-09-11: qty 100
  Filled 2020-09-11: qty 80
  Filled 2020-09-11: qty 100

## 2020-09-11 MED ORDER — ACETAMINOPHEN 325 MG PO TABS: 650.0000 mg | ORAL_TABLET | Freq: Four times a day (QID) | ORAL | Status: DC | PRN

## 2020-09-11 MED ORDER — ACETAMINOPHEN 650 MG RE SUPP: 650.0000 mg | Freq: Four times a day (QID) | RECTAL | Status: DC | PRN

## 2020-09-11 MED ORDER — ONDANSETRON HCL 4 MG PO TABS: 4.0000 mg | ORAL_TABLET | Freq: Four times a day (QID) | ORAL | Status: DC | PRN

## 2020-09-11 MED ORDER — LACTATED RINGERS IV BOLUS
500.0000 mL | Freq: Once | INTRAVENOUS | Status: AC
  Administered 2020-09-11: 500 mL via INTRAVENOUS

## 2020-09-11 MED ORDER — GABAPENTIN 300 MG PO CAPS
300.0000 mg | ORAL_CAPSULE | Freq: Three times a day (TID) | ORAL | Status: DC
  Administered 2020-09-11 – 2020-09-14 (×8): 300 mg via ORAL
  Filled 2020-09-11 (×8): qty 1

## 2020-09-11 MED ORDER — INSULIN ASPART 100 UNIT/ML IJ SOLN: 0.0000 [IU] | Freq: Three times a day (TID) | INTRAMUSCULAR | Status: DC

## 2020-09-11 MED ORDER — NICOTINE 7 MG/24HR TD PT24
7.0000 mg | MEDICATED_PATCH | Freq: Every day | TRANSDERMAL | Status: DC
  Administered 2020-09-12 – 2020-09-14 (×3): 7 mg via TRANSDERMAL
  Filled 2020-09-11 (×3): qty 1

## 2020-09-11 NOTE — ED Notes (Signed)
Attempted to call report but floor nurse did not have a phone in hand at the time and will call back.

## 2020-09-11 NOTE — H&P (Signed)
History and Physical    GENESIS AYLSWORTH [id number] DOB: 04/04/1965 DOA: 09/11/2020  PCP: [name], FNP  Patient coming from: Home  I have personally briefly reviewed patient's old medical records in [place]  Chief Complaint: Weakness, fatigue, dark stools  HPI: Alexandra Bell is a 55 y.o. female with medical history significant of hypertrophic cardiomyopathy s/p ICD, essential hypertension, type 2 diabetes mellitus, chronic diastolic congestive heart failure, paroxysmal atrial fibrillation on Eliquis, tobacco use disorder, morbid obesity who presents to [facility] ED with progressive fatigue, weakness, dark stools and shortness of breath over the last 2 weeks.  Patient denies NSAID abuse or EtOH use.  Patient's family at bedside reports that she is becoming progressively fatigued to the point that she has become more confused and now short of breath.  Also reports increased lower extremity edema, right lower extremity greater than left.  Denies any recent medication changes, reports last dose of her Eliquis and aspirin night of 9/3.  Further denies abdominal pain, no chest pain, no palpitations, no nausea/vomiting, no diarrhea, no dizziness, no headache.  ED Course: Temperature 98.4 F, HR 67, RR 18, BP 100/56, SPO2 88% on room air, placed on 2 L nasal cannula.  WBC 10.5, hemoglobin 4.5 (12.2 on 03/23/20), platelets 242.  Sodium 138, potassium 4.5, chloride 105, CO2 25, BUN 24, creatinine 1.00, glucose 105.  TSH 1.842, free T4 0.97.  CT head with no acute intracranial abnormality.  Chest x-ray with enlarged cardiac silhouette, otherwise unrevealing.  Lactic acid 1.1.  Patient was given 500 mL LR bolus, started on Protonix and 2 unit PRBCs ordered.  Gastroenterology was consulted.  TRH consulted for further evaluation and management of acute hypoxic respiratory failure secondary to acute blood loss anemia concerning for upper GI bleed in the setting of anticoagulant/antiplatelet  use.  Review of Systems:  Constitutional - + Fatigue, No Weight Loss Vision - No impaired vision, decreased visual acuity Ear/Nose/Mouth/Throat - No decreased hearing, no congestion Respiratory - + shortness of breath, no exertional dyspnea, chronic cough Cardiovascular - No chest pain, no palpitations, no peripheral edema Gastrointestinal - No nausea, no diarrhea, no constipation, + dark tarry stools Genitourinary - No excessive urination, no urinary incontinence Integumentary - No rashes or concerning skin lesions Neurologic - No numbness, no tingling, no dizziness, no headaches, + confusion, no memory loss   Past Medical History:  Diagnosis Date   Acute renal failure (HCC)    Arthritis    Asthma    Colitis, 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   Common bile duct stone    Eosinophilia [id number]   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   Fatty liver 11/2010   GERD (gastroesophageal reflux disease)    HOCM (hypertrophic obstructive cardiomyopathy) ([place]) 12/10/2019   Hx: UTI (urinary tract infection)    Hypertension    Inguinal hernia    Morbid obesity ([place]) 12/10/2019   NSVT (nonsustained ventricular tachycardia) ([place]) 03/10/2020   Obesity, Class III, BMI 40-49.9 (morbid obesity) ([place])    Periumbilical hernia     Past Surgical History:  Procedure Laterality Date   ABDOMINAL HYSTERECTOMY     BRONCHOSCOPY, LYMPH NODE BX  MAY 2011   CHOLECYSTECTOMY  2001   ERCP/SPHINCTEROTOMY/STONE EXTRACTION  2001   FLEXIBLE SIGMOIDOSCOPY  11/29/2010   Procedure: FLEXIBLE SIGMOIDOSCOPY;  Surgeon: Lafayette Dragon, MD;  Location: [facility] ENDOSCOPY;  Service: Endoscopy;  Laterality: N/A;   ICD IMPLANT N/A 03/25/2020   Procedure: ICD IMPLANT;  Surgeon: Vickie Epley, MD;  Location: [facility] INVASIVE CV  LAB;  Service: Cardiovascular;  Laterality: N/A;     reports that she has been smoking cigarettes. She has a 15.00 pack-year smoking history. She has never used smokeless tobacco. She reports that she does not drink alcohol and  does not use drugs.  No Known Allergies  Family History  Problem Relation Age of Onset   Diabetes Mother    Colon cancer Father        ?   Heart attack Father    Diabetes Sister     Family history reviewed and not pertinent   Prior to Admission medications   Medication Sig Start Date End Date Taking? Authorizing Provider  acetaminophen (TYLENOL) 500 MG tablet Take 1,500 mg by mouth every 6 (six) hours as needed (For pain).   Yes [provider]  albuterol (VENTOLIN HFA) 108 (90 Base) MCG/ACT inhaler Inhale 2 puffs into the lungs every 6 (six) hours as needed. For asthma 09/07/20  Yes [name], FNP  amLODipine (NORVASC) 10 MG tablet Take 1 tablet (10 mg total) by mouth daily. 07/01/20 09/29/20 Yes Vickie Epley, MD  apixaban Arne [place]) 5 MG TABS tablet Take 1 tablet by mouth twice daily 09/09/20  Yes Skeet Latch, MD  aspirin EC 81 MG tablet Take 81 mg by mouth every morning.   Yes [provider]  carvedilol (COREG) 25 MG tablet Take 1 tablet (25 mg total) by mouth 2 (two) times daily. 03/10/20 09/11/20 Yes Skeet Latch, MD  dapagliflozin propanediol (FARXIGA) 10 MG TABS tablet Take 1 tablet (10 mg total) by mouth daily before breakfast. 09/07/20  Yes [name], FNP  diclofenac Sodium (VOLTAREN) 1 % GEL APPLY 2 G TOPICALLY FOUR TIMES DAILY. 06/13/20  Yes [name], FNP  Diclofenac Sodium CR 100 MG 24 hr tablet Take 1 tablet (100 mg total) by mouth every morning. 07/28/20  Yes [name], FNP  furosemide (LASIX) 80 MG tablet Take 1 tablet (80 mg total) by mouth daily. 03/10/20  Yes Skeet Latch, MD  gabapentin (NEURONTIN) 300 MG capsule TAKE 1 CAPSULE BY MOUTH THREE TIMES DAILY Patient taking differently: Take 300 mg by mouth 3 (three) times daily. 07/12/20  Yes [name], FNP  lisinopril (ZESTRIL) 40 MG tablet Take 1 tablet (40 mg total) by mouth every morning. 09/09/20  Yes Cleaver, Jossie Ng, NP  Magnesium 250 MG TABS Take 250 mg by mouth daily at 2  PM.   Yes [provider]  melatonin 5 MG TABS Take 5 mg by mouth daily at 2 PM.   Yes [provider]  metFORMIN (GLUCOPHAGE) 500 MG tablet TAKE 1 TABLET BY MOUTH TWICE DAILY WITH A MEAL Patient taking differently: Take 500 mg by mouth 2 (two) times daily with a meal. 06/13/20  Yes [name], FNP  Multiple Vitamin (MULTIVITAMIN WITH MINERALS) TABS tablet Take 1 tablet by mouth in the morning. One-A-Day Women's Multivitamin   Yes [provider]    Physical Exam: Vitals:   09/11/20 1700 09/11/20 1733 09/11/20 1754 09/11/20 1822  BP: (!) 105/50 (!) 100/56 (!) 116/52 (!) 122/58  Pulse: 61 67 63 60  Resp: '18 18 16 16  '$ Temp:  98.4 F (36.9 C) 98.3 F (36.8 C)   TempSrc:  Oral Oral   SpO2: 100% 100% 100% 100%    Constitutional: NAD, calm, comfortable, slightly ill in appearance, obese Eyes: PERRL, lids normal appearance, conjunctivae slightly pale ENMT: Mucous membranes are moist. Posterior pharynx clear of any exudate or lesions.Normal dentition.  Neck: normal,  supple, no masses, no thyromegaly Respiratory: clear to auscultation bilaterally, no wheezing, no crackles. Normal respiratory effort. No accessory muscle use.  On 2 L nasal cannula with SPO2 100% at rest Cardiovascular: Regular rate and rhythm, 3/6 SEM LUSB; no rubs / gallops.  2-3+ pitting edema bilateral lower extremities, right greater than left. 2+ pedal pulses. No carotid bruits.  Abdomen: no tenderness, no masses palpated. No hepatosplenomegaly. Bowel sounds positive.  Musculoskeletal: no clubbing / cyanosis. No joint deformity upper and lower extremities. Good ROM, no contractures. Normal muscle tone.  Skin: no rashes, lesions, ulcers. No induration Neurologic: CN 2-12 grossly intact. Sensation intact, DTR normal. Strength 5/5 in all 4.  Psychiatric: Normal judgment and insight. Alert and oriented x 3. Normal mood.    Labs on Admission: I have personally reviewed following labs and imaging  studies  CBC: Recent Labs  Lab 09/11/20 1427  WBC 10.5  NEUTROABS 8.0*  HGB 4.5*  HCT 16.7*  MCV 86.5  PLT [id number]   Basic Metabolic Panel: Recent Labs  Lab 09/11/20 1427  NA 138  K 4.5  CL 105  CO2 25  GLUCOSE 105*  BUN 24*  CREATININE 1.00  CALCIUM 9.8   GFR: Estimated Creatinine Clearance: 85 mL/min (by C-G formula based on SCr of 1 mg/dL). Liver Function Tests: Recent Labs  Lab 09/11/20 1534  AST 17  ALT 13  ALKPHOS 44  BILITOT 0.5  PROT 5.5*  ALBUMIN 2.9*   No results for input(s): LIPASE, AMYLASE in the last 168 hours. Recent Labs  Lab 09/11/20 1533  AMMONIA 13   Coagulation Profile: No results for input(s): INR, PROTIME in the last 168 hours. Cardiac Enzymes: No results for input(s): CKTOTAL, CKMB, CKMBINDEX, TROPONINI in the last 168 hours. BNP (last 3 results) No results for input(s): PROBNP in the last 8760 hours. HbA1C: No results for input(s): HGBA1C in the last 72 hours. CBG: No results for input(s): GLUCAP in the last 168 hours. Lipid Profile: No results for input(s): CHOL, HDL, LDLCALC, TRIG, CHOLHDL, LDLDIRECT in the last 72 hours. Thyroid Function Tests: Recent Labs    09/11/20 1534  TSH 1.842  FREET4 0.97   Anemia Panel: No results for input(s): VITAMINB12, FOLATE, FERRITIN, TIBC, IRON, RETICCTPCT in the last 72 hours. Urine analysis:    Component Value Date/Time   COLORURINE YELLOW 11/28/2010 1033   APPEARANCEUR HAZY (A) 11/28/2010 1033   LABSPEC 1.021 11/28/2010 1033   PHURINE 5.5 11/28/2010 1033   GLUCOSEU NEGATIVE 11/28/2010 1033   HGBUR MODERATE (A) 11/28/2010 1033   BILIRUBINUR negative 05/07/2019 1645   KETONESUR NEGATIVE 11/28/2010 1033   PROTEINUR Negative 05/07/2019 1645   PROTEINUR 30 (A) 11/28/2010 1033   UROBILINOGEN 0.2 05/07/2019 1645   UROBILINOGEN 0.2 11/28/2010 1033   NITRITE negative 05/07/2019 1645   NITRITE NEGATIVE 11/28/2010 1033   LEUKOCYTESUR Negative 05/07/2019 1645    Radiological Exams on  Admission: CT Head Wo Contrast  Result Date: 09/11/2020 CLINICAL DATA:  Mental status change EXAM: CT HEAD WITHOUT CONTRAST TECHNIQUE: Contiguous axial images were obtained from the base of the skull through the vertex without intravenous contrast. COMPARISON:  None. FINDINGS: Brain: No evidence of acute infarction, hemorrhage, hydrocephalus, extra-axial collection or mass lesion/mass effect. Vascular: Atherosclerotic and physiologic intracranial calcifications. Skull: Normal. Negative for fracture or focal lesion. Sinuses/Orbits: No acute finding. Other: Cerumen partially occludes the right external auditory canal IMPRESSION: No acute findings Electronically Signed   By: Lucrezia Europe M.D.   On: 09/11/2020 15:54  DG Chest Port 1 View  Result Date: 09/11/2020 CLINICAL DATA:  Weakness, lethargy EXAM: PORTABLE CHEST 1 VIEW COMPARISON:  Radiograph 03/25/2020, chest CT 07/14/2009 FINDINGS: Unchanged, enlarged cardiac silhouette with dual chamber pacemaker leads. There are bibasilar opacities, which are persistent on multiple prior exams including chest CT in 2011. No new airspace disease. No acute osseous abnormality. IMPRESSION: Unchanged, enlarged cardiac silhouette. Persistent bibasilar opacities, seen on multiple prior exams dating back to 2011, which could represent chronic interstitial lung disease. No new airspace disease. Electronically Signed   By: Maurine Simmering M.D.   On: 09/11/2020 15:28    EKG: Independently reviewed.   Assessment/Plan Principal Problem:   Upper GI bleed Active Problems:   Essential hypertension   HOCM (hypertrophic obstructive cardiomyopathy) (HCC)   Morbid obesity (HCC)   AF (paroxysmal atrial fibrillation) (HCC)   Tobacco use disorder   Type 2 diabetes mellitus ([place])   Diabetic neuropathy ([place])    Acute hypoxic respiratory failure, POA Patient presenting with progressive shortness of breath, weakness, fatigue and was found to have a hemoglobin level of 4.5 which is down  from 12.2 in March 2022.  Chest x-ray with enlarged cardiac silhouette, otherwise unrevealing.  No history of COPD or lung disease.  Etiology likely secondary to acute blood loss anemia from upper GI bleed.  Continue supplemental oxygen, maintain SPO2 greater than 92%.  Continue treatment as below.  Acute blood loss anemia, POA Upper GI bleed Hemoglobin 4.5, was 12.09 March 2020.  Patient reports of melanotic stools over the last 2 weeks.  Complicated by history of paroxysmal atrial fibrillation on Eliquis and antiplatelet use with aspirin.  Denies NSAID abuse and no alcohol abuse.  Last Eliquis/aspirin use night of 9/3. --Gastroenterology consulted --Continue Protonix drip --Check INR --FOBT ordered --Pending transfusion 2 unit PRBC; obtain H&H following transfusion --H&H every 6 hours --Clear liquid diet --N.p.o. after midnight for anticipated need for EGD for further evaluation --Continue monitor on telemetry  Lower extremity edema Notable right greater than left edema to bilateral lower extremities.  Patient states has worsened over the last few weeks.  Is on furosemide 80 mg p.o. daily at baseline for her underlying diastolic congestive heart failure.  Also on Eliquis for anticoagulation for A. fib. --Check vascular duplex ultrasound bilateral lower extremities  Paroxysmal atrial fibrillation Essential hypertension Patient follows with cardiology, Dr. Oval Linsey outpatient.  At baseline on carvedilol 25 mg p.o. twice daily, amlodipine 10 mg p.o. daily, lisinopril 40 mg p.o. daily.  Blood pressure on admission borderline hypotensive, 100/56. --Continue to hold home antihypertensives; beta-blocker --Holding Eliquis/aspirin as above for upper GI bleed --Continue to monitor on telemetry --Closely monitor blood pressure  Hypertrophic cardiomyopathy s/p ICD Patient follows with electrophysiology, Dr. Quentin Ore outpatient.  ICD implant March 25, 2020.  Denies any chest pain. --Holding  antihypertensives/beta-blocker/anticoagulants as above  Chronic diastolic congestive heart failure At baseline, patient on lisinopril 40 mg p.o. daily, carvedilol 25 mg p.o. BID and furosemide 80 mg p.o. daily.  TTE 11/2019 with LVEF 65-70% with grade 2 diastolic dysfunction.  Chest x-ray with no appreciable vascular congestion. --Holding antihypertensives as above given borderline hypotension  Type 2 diabetes mellitus On metformin at home. --Check hemoglobin A1c --Hold oral hypoglycemics while inpatient --Sensitive sliding scale for coverage --CBGs qAC/HS  Peripheral neuropathy: Continue gabapentin 300 mg p.o. 3 times daily  Tobacco use disorder: Counseled on need for complete cessation --Nicotine patch  Morbid obesity: BMI 44.55.  Discussed with patient needs for aggressive lifestyle changes/weight loss as  this complicates all facets of care.  Outpatient follow-up with PCP.  May benefit from bariatric evaluation outpatient.    DVT prophylaxis: SCDs, chemical DVT prophylaxis contraindicated in acute GI bleed Code Status: Full code Family Communication: Updated daughter present at bedside in the ED Disposition Plan: Anticipate discharge home when medically stable, hemoglobin stable Consults called: Gastroenterology, Dr. Ardis Hughs by EDP Admission status: Inpatient Level of care: Telemetry Medical   Severity of Illness: The appropriate patient status for this patient is INPATIENT. Inpatient status is judged to be reasonable and necessary in order to provide the required intensity of service to ensure the patient's safety. The patient's presenting symptoms, physical exam findings, and initial radiographic and laboratory data in the context of their chronic comorbidities is felt to place them at high risk for further clinical deterioration. Furthermore, it is not anticipated that the patient will be medically stable for discharge from the hospital within 2 midnights of admission. The  following factors support the patient status of inpatient.   " The patient's presenting symptoms include confusion, shortness of breath, fatigue, dark tarry stools " The worrisome physical exam findings include pale conjunctiva, hypoxic on room air, borderline hypotensive " The initial radiographic and laboratory data are worrisome because of hemoglobin 4.5 " The chronic co-morbidities include paroxysmal atrial fibrillation on Eliquis, hypertrophic cardiomyopathy, diabetes, congestive heart failure, tobacco use disorder.   * I certify that at the point of admission it is my clinical judgment that the patient will require inpatient hospital care spanning beyond 2 midnights from the point of admission due to high intensity of service, high risk for further deterioration and high frequency of surveillance required.*    Lukasz Rogus J [place] DO [facility] Available via Epic secure chat 7am-7pm After these hours, please refer to coverage provider listed on [email or web address] 09/11/2020, 6:34 PM

## 2020-09-11 NOTE — ED Triage Notes (Signed)
Pt reports generalized weakness, SOB, AMS, and feeling tired for a few days.  Denies pain.  Sats 88% on room air on arrival.

## 2020-09-11 NOTE — ED Notes (Signed)
Electronic blood consent obtained.  

## 2020-09-11 NOTE — ED Provider Notes (Signed)
[facility] Provider Note   CSN: [id number] Arrival date & time: 09/11/20  1345     History Chief Complaint  Patient presents with   AMS    Alexandra LACRETIA DICIOCCIO is a 55 y.o. female with PMHx hypertrophic cardiomyopathy s/p ICD, essential hypertension, type 2 diabetes mellitus, chronic diastolic congestive heart failure, paroxysmal atrial fibrillation on Eliquis, tobacco use disorder, morbid obesity who presents for evaluation of generalized weakness.  Patient reports a two week history of generalized weakness, fatigue, difficulty with ambulation, and cognitive slowing. She states that sometimes she feels lightheaded with associated nausea, which is particularly provoked with positional changes.  She notes occasional diarrhea and dark stool over this time as well.  This has been complicated by confusion over the last few days. Good appetite. No fever, chest pain, SOB. No new medications.      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   Fatty liver 11/2010   GERD (gastroesophageal reflux disease)    HOCM (hypertrophic obstructive cardiomyopathy) ([place]) 12/10/2019   Hx: UTI (urinary tract infection)    Hypertension    Inguinal hernia    Morbid obesity ([place]) 12/10/2019   NSVT (nonsustained ventricular tachycardia) ([place]) 03/10/2020   Obesity, Class III, BMI 40-49.9 (morbid obesity) ([place])    Periumbilical hernia     Patient Active Problem List   Diagnosis Date Noted   Upper GI bleed 09/11/2020   AF (paroxysmal atrial fibrillation) ([place]) 09/11/2020   Tobacco use disorder 09/11/2020   Type 2 diabetes mellitus ([place]) 09/11/2020   Diabetic neuropathy ([place]) 09/11/2020   NSVT (nonsustained ventricular tachycardia) ([place]) 03/10/2020   HOCM (hypertrophic obstructive cardiomyopathy) ([place])  12/10/2019   Morbid obesity ([place]) 12/10/2019   Eosinophilia 01/16/2011   Colitis, eosinophilic 0000000   Leukocytosis 11/28/2010   Essential hypertension 05/18/2009   ASTHMA 05/18/2009   Nonspecific (abnormal) findings on radiological and other examination of body structure 05/18/2009   COMPUTERIZED TOMOGRAPHY, CHEST, ABNORMAL 05/18/2009    Past Surgical History:  Procedure Laterality Date   ABDOMINAL HYSTERECTOMY     BRONCHOSCOPY, LYMPH NODE BX  MAY 2011   CHOLECYSTECTOMY  2001   ERCP/SPHINCTEROTOMY/STONE EXTRACTION  2001   FLEXIBLE SIGMOIDOSCOPY  11/29/2010   Procedure: FLEXIBLE SIGMOIDOSCOPY;  Surgeon: Lafayette Dragon, MD;  Location[facility] ENDOSCOPY;  Service: Endoscopy;  Laterality: N/A;   ICD IMPLANT N/A 03/25/2020   Procedure: ICD IMPLANT;  Surgeon: Vickie Epley, MD;  Location: [facility] CV LAB;  Service: Cardiovascular;  Laterality: N/A;     OB History   No obstetric history on file.     Family History  Problem Relation Age of Onset   Diabetes Mother    Colon cancer Father        ?   Heart attack Father    Diabetes Sister     Social History   Tobacco Use   Smoking status: Every Day    Packs/day: 0.50    Years: 30.00    Pack years: 15.00    Types: Cigarettes   Smokeless tobacco: Never   Tobacco comments:    she is less than a PPD - states down to 6 cigarettes a day - 10/14  Vaping Use   Vaping Use: Never used  Substance Use Topics   Alcohol use:  No   Drug use: No    Home Medications Prior to Admission medications   Medication Sig Start Date End Date Taking? Authorizing Provider  acetaminophen (TYLENOL) 500 MG tablet Take 1,500 mg by mouth every 6 (six) hours as needed (For pain).   Yes [provider]  albuterol (VENTOLIN HFA) 108 (90 Base) MCG/ACT inhaler Inhale 2 puffs into the lungs every 6 (six) hours as needed. For asthma 09/07/20  Yes [name], FNP  amLODipine (NORVASC) 10 MG tablet Take 1 tablet (10 mg total) by mouth daily.  07/01/20 09/29/20 Yes Vickie Epley, MD  apixaban Arne [place]) 5 MG TABS tablet Take 1 tablet by mouth twice daily 09/09/20  Yes Skeet Latch, MD  aspirin EC 81 MG tablet Take 81 mg by mouth every morning.   Yes [provider]  carvedilol (COREG) 25 MG tablet Take 1 tablet (25 mg total) by mouth 2 (two) times daily. 03/10/20 09/11/20 Yes Skeet Latch, MD  dapagliflozin propanediol (FARXIGA) 10 MG TABS tablet Take 1 tablet (10 mg total) by mouth daily before breakfast. 09/07/20  Yes [name], FNP  diclofenac Sodium (VOLTAREN) 1 % GEL APPLY 2 G TOPICALLY FOUR TIMES DAILY. 06/13/20  Yes [name], FNP  Diclofenac Sodium CR 100 MG 24 hr tablet Take 1 tablet (100 mg total) by mouth every morning. 07/28/20  Yes [name], FNP  furosemide (LASIX) 80 MG tablet Take 1 tablet (80 mg total) by mouth daily. 03/10/20  Yes Skeet Latch, MD  gabapentin (NEURONTIN) 300 MG capsule TAKE 1 CAPSULE BY MOUTH THREE TIMES DAILY Patient taking differently: Take 300 mg by mouth 3 (three) times daily. 07/12/20  Yes [name], FNP  lisinopril (ZESTRIL) 40 MG tablet Take 1 tablet (40 mg total) by mouth every morning. 09/09/20  Yes Cleaver, Jossie Ng, NP  Magnesium 250 MG TABS Take 250 mg by mouth daily at 2 PM.   Yes [provider]  melatonin 5 MG TABS Take 5 mg by mouth daily at 2 PM.   Yes [provider]  metFORMIN (GLUCOPHAGE) 500 MG tablet TAKE 1 TABLET BY MOUTH TWICE DAILY WITH A MEAL Patient taking differently: Take 500 mg by mouth 2 (two) times daily with a meal. 06/13/20  Yes [name], FNP  Multiple Vitamin (MULTIVITAMIN WITH MINERALS) TABS tablet Take 1 tablet by mouth in the morning. One-A-Day Women's Multivitamin   Yes [provider]    Allergies    Patient has no known allergies.  Review of Systems   Review of Systems  Constitutional:  Positive for fatigue. Negative for chills and fever.  HENT:  Negative for ear pain and sore throat.   Eyes:  Negative  for pain and visual disturbance.  Respiratory:  Negative for cough and shortness of breath.   Cardiovascular:  Negative for chest pain and palpitations.  Gastrointestinal:  Positive for diarrhea and nausea. Negative for abdominal pain and vomiting.  Genitourinary:  Negative for dysuria and hematuria.  Musculoskeletal:  Negative for arthralgias and back pain.  Skin:  Negative for color change and rash.  Neurological:  Positive for weakness and light-headedness. Negative for seizures and syncope.  Psychiatric/Behavioral:  Positive for confusion.   All other systems reviewed and are negative.  Physical Exam Updated Vital Signs BP (!) 99/45   Pulse 60   Temp 98 F (36.7 C) (Axillary)   Resp 16   Ht '5\' 6"'$  (1.676 m)   Wt 127.1 kg   SpO2 100%   BMI 45.23  kg/m   Physical Exam Vitals and nursing note reviewed.  Constitutional:      General: She is not in acute distress.    Appearance: She is well-developed.  HENT:     Head: Normocephalic and atraumatic.  Eyes:     Conjunctiva/sclera: Conjunctivae normal.  Cardiovascular:     Rate and Rhythm: Normal rate and regular rhythm.     Heart sounds: No murmur heard. Pulmonary:     Effort: Pulmonary effort is normal. No respiratory distress.     Breath sounds: Normal breath sounds.  Abdominal:     Palpations: Abdomen is soft.     Tenderness: There is no abdominal tenderness.  Musculoskeletal:     Cervical back: Neck supple.  Skin:    General: Skin is warm and dry.  Neurological:     General: No focal deficit present.     Mental Status: She is alert. Mental status is at baseline.     Cranial Nerves: No cranial nerve deficit.     Sensory: No sensory deficit.     Motor: No weakness.     Coordination: Coordination normal.     Comments: No focal neurologic deficits but patient is mildly encephalopathic with confusion at times and delayed responses.    ED Results / Procedures / Treatments   Labs (all labs ordered are listed, but only  abnormal results are displayed) Labs Reviewed  BASIC METABOLIC PANEL - Abnormal; Notable for the following components:      Result Value   Glucose, Bld 105 (*)    BUN 24 (*)    All other components within normal limits  CBC WITH DIFFERENTIAL/PLATELET - Abnormal; Notable for the following components:   RBC 1.93 (*)    Hemoglobin 4.5 (*)    HCT 16.7 (*)    MCH 23.3 (*)    MCHC 26.9 (*)    RDW 16.3 (*)    nRBC 0.7 (*)    Neutro Abs 8.0 (*)    All other components within normal limits  BRAIN NATRIURETIC PEPTIDE - Abnormal; Notable for the following components:   B Natriuretic Peptide 1,063.3 (*)    All other components within normal limits  HEPATIC FUNCTION PANEL - Abnormal; Notable for the following components:   Total Protein 5.5 (*)    Albumin 2.9 (*)    All other components within normal limits  PROTIME-INR - Abnormal; Notable for the following components:   Prothrombin Time 16.4 (*)    INR 1.3 (*)    All other components within normal limits  CBC - Abnormal; Notable for the following components:   RBC 2.15 (*)    Hemoglobin 5.2 (*)    HCT 18.6 (*)    MCH 24.2 (*)    MCHC 28.0 (*)    RDW 16.1 (*)    nRBC 0.4 (*)    All other components within normal limits  BASIC METABOLIC PANEL - Abnormal; Notable for the following components:   BUN 24 (*)    Anion gap 4 (*)    All other components within normal limits  IRON AND TIBC - Abnormal; Notable for the following components:   Iron 21 (*)    TIBC 515 (*)    Saturation Ratios 4 (*)    All other components within normal limits  FERRITIN - Abnormal; Notable for the following components:   Ferritin 6 (*)    All other components within normal limits  RETICULOCYTES - Abnormal; Notable for the following components:   Retic  Ct Pct 3.3 (*)    RBC. 2.13 (*)    Immature Retic Fract 45.3 (*)    All other components within normal limits  RESP PANEL BY RT-PCR (FLU A&B, COVID) ARPGX2  LACTIC ACID, PLASMA  AMMONIA  TSH  T4, FREE   VITAMIN B12  GLUCOSE, CAPILLARY  URINALYSIS, COMPLETE (UACMP) WITH MICROSCOPIC  OCCULT BLOOD X 1 CARD TO LAB, STOOL  HEMOGLOBIN A1C  HEMOGLOBIN AND HEMATOCRIT, BLOOD  FOLATE  HEMOGLOBIN AND HEMATOCRIT, BLOOD  PREPARE RBC (CROSSMATCH)  TYPE AND SCREEN    EKG EKG Interpretation  Date/Time:  Sunday September 11 2020 14:23:40 EDT Ventricular Rate:  62 PR Interval:  178 QRS Duration: 92 QT Interval:  432 QTC Calculation: 438 R Axis:   0 Text Interpretation: Atrial-paced rhythm Left ventricular hypertrophy with repolarization abnormality ( R in aVL , Cornell product ) Abnormal ECG Confirmed by Sherwood Gambler [phone]) on 09/11/2020 3:19:20 PM  Radiology CT Head Wo Contrast  Result Date: 09/11/2020 CLINICAL DATA:  Mental status change EXAM: CT HEAD WITHOUT CONTRAST TECHNIQUE: Contiguous axial images were obtained from the base of the skull through the vertex without intravenous contrast. COMPARISON:  None. FINDINGS: Brain: No evidence of acute infarction, hemorrhage, hydrocephalus, extra-axial collection or mass lesion/mass effect. Vascular: Atherosclerotic and physiologic intracranial calcifications. Skull: Normal. Negative for fracture or focal lesion. Sinuses/Orbits: No acute finding. Other: Cerumen partially occludes the right external auditory canal IMPRESSION: No acute findings Electronically Signed   By: Lucrezia Europe M.D.   On: 09/11/2020 15:54   DG Chest Port 1 View  Result Date: 09/11/2020 CLINICAL DATA:  Weakness, lethargy EXAM: PORTABLE CHEST 1 VIEW COMPARISON:  Radiograph 03/25/2020, chest CT 07/14/2009 FINDINGS: Unchanged, enlarged cardiac silhouette with dual chamber pacemaker leads. There are bibasilar opacities, which are persistent on multiple prior exams including chest CT in 2011. No new airspace disease. No acute osseous abnormality. IMPRESSION: Unchanged, enlarged cardiac silhouette. Persistent bibasilar opacities, seen on multiple prior exams dating back to 2011, which could  represent chronic interstitial lung disease. No new airspace disease. Electronically Signed   By: Maurine Simmering M.D.   On: 09/11/2020 15:28    Procedures Procedures   Medications Ordered in ED Medications  nicotine (NICODERM CQ - dosed in mg/24 hr) patch 7 mg (has no administration in time range)  gabapentin (NEURONTIN) capsule 300 mg (300 mg Oral Given 09/11/20 2136)  albuterol (PROVENTIL) (2.5 MG/3ML) 0.083% nebulizer solution 3 mL (has no administration in time range)  insulin aspart (novoLOG) injection 0-9 Units (has no administration in time range)  insulin aspart (novoLOG) injection 0-5 Units (0 Units Subcutaneous Not Given 09/11/20 2158)  acetaminophen (TYLENOL) tablet 650 mg (has no administration in time range)    Or  acetaminophen (TYLENOL) suppository 650 mg (has no administration in time range)  ondansetron (ZOFRAN) tablet 4 mg (has no administration in time range)    Or  ondansetron (ZOFRAN) injection 4 mg (has no administration in time range)  pantoprozole (PROTONIX) 80 mg /NS 100 mL infusion (8 mg/hr Intravenous New Bag/Given 09/11/20 1942)  pantoprazole (PROTONIX) injection 40 mg (has no administration in time range)  lactated ringers bolus 500 mL (0 mLs Intravenous Stopped 09/11/20 1636)  pantoprazole (PROTONIX) 80 mg /NS 100 mL IVPB (0 mg Intravenous Stopped 09/11/20 1820)    ED Course  I have reviewed the triage vital signs and the nursing notes.  Pertinent labs & imaging results that were available during my care of the patient were reviewed by me and considered  in my medical decision making (see chart for details).    MDM Rules/Calculators/A&P                           55 y.o. female with past medical history as above who presents for evaluation of generalized weakness.  Afebrile and hemodynamically stable with borderline hypotensive blood pressure readings.  Exam as detailed above.  No frank blood on DRE.  CBC notable for critical anemia with hemoglobin 4.5.  BUN mildly  elevated as well.  Patient was consented and transfused 2 units PRBCs.  Patient was given 80 mg IV Protonix.  Given 2-week history, presentation is likely consistent with insidious GI bleed.  No indication for Eliquis reversal at this time.  Gastroenterology was consulted for further recommendations, who will evaluate the patient in the morning given patient's stable state and likely subacute bleeding.  Hospitalist will plan for admission.  Final Clinical Impression(s) / ED Diagnoses Final diagnoses:  Symptomatic anemia    Rx / DC Orders ED Discharge Orders     None        Violet Baldy, MD 09/13/20 1357    Sherwood Gambler, MD 09/16/20 1731

## 2020-09-12 ENCOUNTER — Inpatient Hospital Stay (HOSPITAL_COMMUNITY): Payer: 59

## 2020-09-12 DIAGNOSIS — K922 Gastrointestinal hemorrhage, unspecified: Secondary | ICD-10-CM

## 2020-09-12 DIAGNOSIS — R609 Edema, unspecified: Secondary | ICD-10-CM

## 2020-09-12 LAB — GLUCOSE, CAPILLARY
Glucose-Capillary: 97 mg/dL (ref 70–99)
Glucose-Capillary: 91 mg/dL (ref 70–99)
Glucose-Capillary: 114 mg/dL — ABNORMAL HIGH (ref 70–99)
Glucose-Capillary: 129 mg/dL — ABNORMAL HIGH (ref 70–99)
Glucose-Capillary: 62 mg/dL — ABNORMAL LOW (ref 70–99)

## 2020-09-12 LAB — PREPARE RBC (CROSSMATCH)

## 2020-09-12 LAB — HEMOGLOBIN AND HEMATOCRIT, BLOOD
HCT: 22 % — ABNORMAL LOW (ref 36.0–46.0)
HCT: 24.9 % — ABNORMAL LOW (ref 36.0–46.0)
HCT: 25.5 % — ABNORMAL LOW (ref 36.0–46.0)
Hemoglobin: 6.7 g/dL — CL (ref 12.0–15.0)
Hemoglobin: 7.4 g/dL — ABNORMAL LOW (ref 12.0–15.0)
Hemoglobin: 7.6 g/dL — ABNORMAL LOW (ref 12.0–15.0)

## 2020-09-12 LAB — HEMOGLOBIN A1C
Hgb A1c MFr Bld: 4.7 % — ABNORMAL LOW (ref 4.8–5.6)
Mean Plasma Glucose: 88.19 mg/dL

## 2020-09-12 LAB — FOLATE: Folate: 78.9 ng/mL (ref >5.9)

## 2020-09-12 MED ORDER — PANTOPRAZOLE SODIUM 40 MG IV SOLR
40.0000 mg | Freq: Two times a day (BID) | INTRAVENOUS | Status: DC
  Administered 2020-09-12 – 2020-09-13 (×3): 40 mg via INTRAVENOUS
  Filled 2020-09-12 (×3): qty 40

## 2020-09-12 MED ORDER — SODIUM CHLORIDE 0.9% IV SOLUTION: Freq: Once | INTRAVENOUS | Status: AC

## 2020-09-12 MED ORDER — LACTATED RINGERS IV SOLN: INTRAVENOUS | Status: AC

## 2020-09-12 MED ORDER — PEG-KCL-NACL-NASULF-NA ASC-C 100 G PO SOLR
1.0000 | Freq: Once | ORAL | Status: AC
  Administered 2020-09-12: 200 g via ORAL
  Filled 2020-09-12: qty 1

## 2020-09-12 NOTE — Consult Note (Addendum)
Referring Provider: EDP Primary Care Physician:  [name], FNP Primary Gastroenterologist:  Dr. Olevia Perches  Reason for Consultation:  Anemia and GI bleeding  HPI: Alexandra Bell is a 55 y.o. female with medical history significant of hypertrophic cardiomyopathy s/p ICD, essential hypertension, type 2 diabetes mellitus, chronic diastolic congestive heart failure, paroxysmal atrial fibrillation on Eliquis (last dose 9/3 evening), tobacco use disorder, morbid obesity who presents to [facility] ED with progressive fatigue, weakness, dark stools and shortness of breath over the last 2 weeks.   She tells me that her stools were darker in color than normal, but no black.  No red blood per rectum except for occasional bright red blood on the toilet paper when she wipes.  No abdominal pain, nausea, vomiting, weight loss, loss of appetite, heartburn/reflux, dysphagia.  Does use diclofenac regularly at home for the past year or so.  Hgb 4.5 grams down from 12.2 grams just 5 months ago.  Has received 2 units PRBCs with increase to 6.7 grams.  BUN slightly elevated at 24.  Hemoccult pending.  EGD in 2012 showed moderate gastritis and esophagitis in the entire esophagus.  1. Surgical [P], small bowel, bx FRAGMENTS OF DUODENAL MUCOSA WITH INFLAMMATION AND INCREASED EOSINOPHILS. SEE MICROSCOPIC DESCRIPTION. 2. Surgical [P], gastric, bx GASTRIC MUCOSAL FRAGMENTS WITH SLIGHT INFLAMMATION AND INCREASED EOSINOPHILS. SEE MICROSCOPIC DESCRIPTION. NO HELICOBACTER PYLORI, DYSPLASIA OR MALIGNANCY. 3. Surgical [P], esophagus, bx BENIGN ESOPHAGEAL SQUAMOUS MUCOSA. NO FEATURES OF EOSINOPHILIC ESOPHAGITIS (LESS THAN TWO EOSINOPHILS PER HIGH POWER FIELD), FUNGI, DYSPLASIA OR MALIGNANCY.  Flex sig in 11/2010 showed "colitis".  - COLONIC MUCOSA WITH INTACT CRYPT ARCHITECTURE, LAMINA PROPRIA LYMPHOPLASMACYTIC AND HISTIOCYTIC INFLAMMATION AND ABUNDANT EOSINOPHILS INCLUDING EOSINOPHILIC CRYPTITIS, SEE COMMENT. -  NEGATIVE FOR DYSPLASIA.   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   Fatty liver 11/2010   GERD (gastroesophageal reflux disease)    HOCM (hypertrophic obstructive cardiomyopathy) ([place]) 12/10/2019   Hx: UTI (urinary tract infection)    Hypertension    Inguinal hernia    Morbid obesity ([place]) 12/10/2019   NSVT (nonsustained ventricular tachycardia) ([place]) 03/10/2020   Obesity, Class III, BMI 40-49.9 (morbid obesity) ([place])    Periumbilical hernia     Past Surgical History:  Procedure Laterality Date   ABDOMINAL HYSTERECTOMY     BRONCHOSCOPY, LYMPH NODE BX  MAY 2011   CHOLECYSTECTOMY  2001   ERCP/SPHINCTEROTOMY/STONE EXTRACTION  2001   FLEXIBLE SIGMOIDOSCOPY  11/29/2010   Procedure: FLEXIBLE SIGMOIDOSCOPY;  Surgeon: Lafayette Dragon, MD;  Location: [facility] ENDOSCOPY;  Service: Endoscopy;  Laterality: N/A;   ICD IMPLANT N/A 03/25/2020   Procedure: ICD IMPLANT;  Surgeon: Vickie Epley, MD;  Location: [facility];  Service: Cardiovascular;  Laterality: N/A;    Prior to Admission medications   Medication Sig Start Date End Date Taking? Authorizing Provider  acetaminophen (TYLENOL) 500 MG tablet Take 1,500 mg by mouth every 6 (six) hours as needed (For pain).   Yes [provider]  albuterol (VENTOLIN HFA) 108 (90 Base) MCG/ACT inhaler Inhale 2 puffs into the lungs every 6 (six) hours as needed. For asthma 09/07/20  Yes [name], FNP  amLODipine (NORVASC) 10 MG tablet Take 1 tablet (10 mg total) by mouth daily. 07/01/20 09/29/20 Yes Vickie Epley, MD  apixaban (ELIQUIS) 5 MG  TABS tablet Take 1 tablet by mouth twice daily 09/09/20  Yes Skeet Latch, MD  aspirin EC 81 MG tablet Take 81 mg by mouth every morning.   Yes [provider]  carvedilol (COREG) 25 MG tablet Take 1 tablet (25 mg total) by  mouth 2 (two) times daily. 03/10/20 09/11/20 Yes Skeet Latch, MD  dapagliflozin propanediol (FARXIGA) 10 MG TABS tablet Take 1 tablet (10 mg total) by mouth daily before breakfast. 09/07/20  Yes [name], FNP  diclofenac Sodium (VOLTAREN) 1 % GEL APPLY 2 G TOPICALLY FOUR TIMES DAILY. 06/13/20  Yes [name], FNP  Diclofenac Sodium CR 100 MG 24 hr tablet Take 1 tablet (100 mg total) by mouth every morning. 07/28/20  Yes [name], FNP  furosemide (LASIX) 80 MG tablet Take 1 tablet (80 mg total) by mouth daily. 03/10/20  Yes Skeet Latch, MD  gabapentin (NEURONTIN) 300 MG capsule TAKE 1 CAPSULE BY MOUTH THREE TIMES DAILY Patient taking differently: Take 300 mg by mouth 3 (three) times daily. 07/12/20  Yes [name], FNP  lisinopril (ZESTRIL) 40 MG tablet Take 1 tablet (40 mg total) by mouth every morning. 09/09/20  Yes Cleaver, Jossie Ng, NP  Magnesium 250 MG TABS Take 250 mg by mouth daily at 2 PM.   Yes [provider]  melatonin 5 MG TABS Take 5 mg by mouth daily at 2 PM.   Yes [provider]  metFORMIN (GLUCOPHAGE) 500 MG tablet TAKE 1 TABLET BY MOUTH TWICE DAILY WITH A MEAL Patient taking differently: Take 500 mg by mouth 2 (two) times daily with a meal. 06/13/20  Yes [name], FNP  Multiple Vitamin (MULTIVITAMIN WITH MINERALS) TABS tablet Take 1 tablet by mouth in the morning. One-A-Day Women's Multivitamin   Yes [provider]    Current Facility-Administered Medications  Medication Dose Route Frequency Provider Last Rate Last Admin   acetaminophen (TYLENOL) tablet 650 mg  650 mg Oral Q6H PRN [place] Eric J, DO       Or   acetaminophen (TYLENOL) suppository 650 mg  650 mg Rectal Q6H PRN [place] Eric J, DO       albuterol (PROVENTIL) (2.5 MG/3ML) 0.083% nebulizer solution 3 mL  3 mL Inhalation Q6H PRN [place] Eric J, DO       gabapentin (NEURONTIN) capsule 300 mg  300 mg Oral TID [place] Eric J, DO   300 mg at 09/11/20 2136   insulin aspart  (novoLOG) injection 0-5 Units  0-5 Units Subcutaneous QHS [place] Eric J, DO       insulin aspart (novoLOG) injection 0-9 Units  0-9 Units Subcutaneous TID WC [place] Eric J, DO       lactated ringers infusion   Intravenous Continuous Chotiner, Yevonne Aline, MD 75 mL/hr at 09/12/20 0348 New Bag at 09/12/20 0348   nicotine (NICODERM CQ - dosed in mg/24 hr) patch 7 mg  7 mg Transdermal Daily [place] Eric J, DO       ondansetron [facility]) tablet 4 mg  4 mg Oral Q6H PRN [place] Eric J, DO       Or   ondansetron [facility]) injection 4 mg  4 mg Intravenous Q6H PRN [place] Donnamarie Poag, DO       [START ON 09/15/2020] pantoprazole (PROTONIX) injection 40 mg  40 mg Intravenous Q12H [place] Eric J, DO       pantoprozole (PROTONIX) 80 mg /NS 100 mL infusion  8 mg/hr Intravenous Continuous [place] Eric J, DO 10 mL/hr at 09/12/20  [id number] 8 mg/hr at 09/12/20 [id number]    Allergies as of 09/11/2020   (No Known Allergies)    Family History  Problem Relation Age of Onset   Diabetes Mother    Colon cancer Father        ?   Heart attack Father    Diabetes Sister     Social History   Socioeconomic History   Marital status: Married    Spouse name: Not on file   Number of children: 2   Years of education: Not on file   Highest education level: Not on file  Occupational History   Occupation: Food prep, Metallurgist: UNEMPLOYED  Tobacco Use   Smoking status: Every Day    Packs/day: 0.50    Years: 30.00    Pack years: 15.00    Types: Cigarettes   Smokeless tobacco: Never   Tobacco comments:    she is less than a PPD - states down to 6 cigarettes a day - 10/14  Vaping Use   Vaping Use: Never used  Substance and Sexual Activity   Alcohol use: No   Drug use: No   Sexual activity: Not on file  Other Topics Concern   Not on file  Social History Narrative   Not on file   Social Determinants of Health   Financial Resource Strain: Not on file  Food Insecurity: Not on file  Transportation Needs: Not  on file  Physical Activity: Not on file  Stress: Not on file  Social Connections: Not on file  Intimate Partner Violence: Not on file    Review of Systems: ROS is O/W negative except as mentioned in HPI.  Physical Exam: Vital signs in last 24 hours: Temp:  [97.5 F (36.4 C)-98.4 F (36.9 C)] 98.4 F (36.9 C) (09/05 0147) Pulse Rate:  [58-67] 60 (09/05 0147) Resp:  [11-22] 19 (09/05 0147) BP: (99-143)/(44-61) 119/61 (09/05 0147) SpO2:  [96 %-100 %] 100 % (09/05 0147) Weight:  [127.1 kg] 127.1 kg (09/04 2142)   General:  Alert, Well-developed, well-nourished, pleasant and cooperative in NAD Head:  Normocephalic and atraumatic. Eyes:  Sclera clear, no icterus.  Conjunctiva pink. Ears:  Normal auditory acuity. Mouth:  No deformity or lesions.   Lungs:  Clear throughout to auscultation.  No wheezes, crackles, or rhonchi.  Heart:  Regular rate and rhythm; SEM noted. Abdomen:  Soft, obese. Non-distended.  BS present.  Non-tender. Msk:  Symmetrical without gross deformities. Pulses:  Normal pulses noted. Extremities:  2-3+ pitting edema in B/L LEs, R>L. Neurologic:  Alert and oriented x 4;  grossly normal neurologically. Skin:  Intact without significant lesions or rashes. Psych:  Alert and cooperative. Normal mood and affect.  Intake/Output from previous day: 09/04 0701 - 09/05 0700 In: 1349.5 [P.O.:100; I.V.:304.5; Blood:945] Out: -  Intake/Output this shift: Total I/O In: 0  Out: 150 [Urine:150]  Lab Results: Recent Labs    09/11/20 1427 09/11/20 2102  WBC 10.5 9.1  HGB 4.5* 5.2*  HCT 16.7* 18.6*  PLT 242 225   BMET Recent Labs    09/11/20 1427 09/11/20 2102  NA 138 139  K 4.5 4.6  CL 105 109  CO2 25 26  GLUCOSE 105* 93  BUN 24* 24*  CREATININE 1.00 0.95  CALCIUM 9.8 9.5   LFT Recent Labs    09/11/20 1534  PROT 5.5*  ALBUMIN 2.9*  AST 17  ALT 13  ALKPHOS 44  BILITOT 0.5  BILIDIR <0.1  IBILI  NOT CALCULATED   PT/INR Recent Labs     09/11/20 2102  LABPROT 16.4*  INR 1.3*    Studies/Results: CT Head Wo Contrast  Result Date: 09/11/2020 CLINICAL DATA:  Mental status change EXAM: CT HEAD WITHOUT CONTRAST TECHNIQUE: Contiguous axial images were obtained from the base of the skull through the vertex without intravenous contrast. COMPARISON:  None. FINDINGS: Brain: No evidence of acute infarction, hemorrhage, hydrocephalus, extra-axial collection or mass lesion/mass effect. Vascular: Atherosclerotic and physiologic intracranial calcifications. Skull: Normal. Negative for fracture or focal lesion. Sinuses/Orbits: No acute finding. Other: Cerumen partially occludes the right external auditory canal IMPRESSION: No acute findings Electronically Signed   By: Lucrezia Europe M.D.   On: 09/11/2020 15:54   DG Chest Port 1 View  Result Date: 09/11/2020 CLINICAL DATA:  Weakness, lethargy EXAM: PORTABLE CHEST 1 VIEW COMPARISON:  Radiograph 03/25/2020, chest CT 07/14/2009 FINDINGS: Unchanged, enlarged cardiac silhouette with dual chamber pacemaker leads. There are bibasilar opacities, which are persistent on multiple prior exams including chest CT in 2011. No new airspace disease. No acute osseous abnormality. IMPRESSION: Unchanged, enlarged cardiac silhouette. Persistent bibasilar opacities, seen on multiple prior exams dating back to 2011, which could represent chronic interstitial lung disease. No new airspace disease. Electronically Signed   By: Maurine Simmering M.D.   On: 09/11/2020 15:28    IMPRESSION:  *GI bleed:  Suspect upper source (likely subacute bleed) with darker colored stools (but not black) and long-standing NSAID use with diclofenac.  BUN slightly elevated at 24.  Hemoccult pending. *ALBA/iron deficiency anemia:  Secondary to above.  Hgb 4.5 grams on admission compared to 12.2 grams just 5 months ago.  Received 2 units PRBCs.  Hgb up to 6.7 grams. *PAF on Eliquis with last dose then evening of 9/3. *NSAID use as above  PLAN: -Continue  PPI gtt for now. -Monitor Hgb and transfuse further for Hgb > 7-7.5 grams. -Would benefit from IV iron infusion while inpatient. -EGD and colonoscopy with Dr. Lyndel Safe on 9/6.   Laban Emperor. Zehr  09/12/2020, 9:15 AM  ________________________________________________________________________  Velora Heckler GI MD note:  I personally examined the patient, reviewed the data and agree with the assessment and plan described above.  55 yo woman with multiple med issues, on eliquis. Has had darker than usual stools for the past few weeks but no other GI symptoms.  No abd pain, no changes in her bowels, no weight loss, no nausea or vomiting. Does very occasionally see minor BRBPR with wiping.  Hb 4.5 on admit, normal MCV. Last eliquis ws two days ago. EGD colonsocoyp 2012 see above.  She is HD stable.  She understands that we are planning for EGD and colonoscopy tomorrow with Dr. Lyndel Safe for further evaluation.  I think PPI IV BID should be safe and will stop the drip for now.   Owens Loffler, MD [facility] Gastroenterology Pager [phone]

## 2020-09-12 NOTE — Plan of Care (Signed)
  Problem: Elimination: Goal: Will not experience complications related to bowel motility Outcome: Progressing   Problem: Elimination: Goal: Will not experience complications related to urinary retention Outcome: Progressing   

## 2020-09-12 NOTE — Progress Notes (Signed)
Lower extremity venous has been completed.   Preliminary results in CV Proc.   Alexandra Bell 09/12/2020 9:51 AM

## 2020-09-12 NOTE — Progress Notes (Signed)
PROGRESS NOTE    Alexandra Bell  JJK:[id number] DOB: 08-Apr-1965 DOA: 09/11/2020 PCP: [name], FNP  Outpatient Specialists:   Brief Narrative:  Patient is a 55 year old African-American female with past medical history significant for hypertrophic cardiomyopathy s/p ICD, essential hypertension, type 2 diabetes mellitus, chronic diastolic congestive heart failure, paroxysmal atrial fibrillation on Eliquis, tobacco use disorder, morbid obesity.  Patient was admitted with symptomatic anemia.  Hemoglobin was 4.5 g/dL on presentation (down from around 12 g/dL about 5 months ago).  Patient was on Eliquis, diclofenac and aspirin prior to admission.  Patient was transfused with 2 units of packed red blood cells and hemoglobin came up to 6.7 g/dL.  We will also transfuse 1 unit of packed red blood cells.  H&H will be followed.  GI team is waiting for a couple days prior to proceeding with EGD.  As mentioned above, patient was on Eliquis.  Patient reports feeling bit better.  Assessment & Plan:   Principal Problem:   Upper GI bleed Active Problems:   Essential hypertension   HOCM (hypertrophic obstructive cardiomyopathy) (HCC)   Morbid obesity (HCC)   AF (paroxysmal atrial fibrillation) (HCC)   Tobacco use disorder   Type 2 diabetes mellitus (HCC)   Diabetic neuropathy (HCC)  Acute hypoxic respiratory failure, POA -This is likely secondary to symptomatic anemia. -Resolved significantly with blood transfusion. -Continue to monitor H/H.     Acute blood loss anemia, POA Upper GI bleed -Hemoglobin presentation was 4.5 per deciliter. -Hemoglobin was 12.2 g/dL in March 2022.   -Patient reported experiencing melanotic stools over the last 2 weeks.  Complicated by history of paroxysmal atrial fibrillation on Eliquis and antiplatelet use with aspirin.  -Patient was also on diclofenac prior to presentation.  . --Gastroenterology consulted --Continue Protonix drip -- NR was 1.3.  C -Patient has  been transfused with 2 units of packed red blood cells. -Hemoglobin was 6.7 g/dL posttransfusion. -We will transfuse another unit of packed red blood cells.   Lower extremity edema Notable right greater than left edema to bilateral lower extremities.  Patient states has worsened over the last few weeks.  Is on furosemide 80 mg p.o. daily at baseline for her underlying diastolic congestive heart failure.  Also on Eliquis for anticoagulation for A. fib. -- Follow the result of vascular duplex ultrasound bilateral lower extremities   Paroxysmal atrial fibrillation Essential hypertension Patient follows with cardiology, Dr. Oval Linsey outpatient.  At baseline on carvedilol 25 mg p.o. twice daily, amlodipine 10 mg p.o. daily, lisinopril 40 mg p.o. daily.  Blood pressure on admission borderline hypotensive, 100/56. --Continue to hold home antihypertensives; beta-blocker --Holding Eliquis/aspirin as above for upper GI bleed --Continue to monitor on telemetry --Closely monitor blood pressure   Hypertrophic cardiomyopathy s/p ICD Patient follows with electrophysiology, Dr. Quentin Ore outpatient.  ICD implant March 25, 2020.  Denies any chest pain. --Holding antihypertensives/beta-blocker/anticoagulants as above   Chronic diastolic congestive heart failure -At baseline, patient was on lisinopril 40 mg p.o. daily, carvedilol 25 mg p.o. BID and furosemide 80 mg p.o. daily.   -TTE 11/2019 with LVEF 65-70% with grade 2 diastolic dysfunction.   -Chest x-ray with no appreciable vascular congestion.   Type 2 diabetes mellitus -On metformin at home. -- A1c was 4.7%.   --Sensitive sliding scale for coverage --CBGs qAC/HS   Peripheral neuropathy: Continue gabapentin 300 mg p.o. 3 times daily   Tobacco use disorder: Counseled on need for complete cessation --Nicotine patch   Morbid obesity: -BMI 44.55.  Paroxysmal atrial fibrillation Essential hypertension -Patient follows with cardiology, Dr.  Oval Linsey outpatient.  At baseline on carvedilol 25 mg p.o. twice daily, amlodipine 10 mg p.o. daily, lisinopril 40 mg p.o. daily.  Blood pressure on admission borderline hypotensive, 100/56. --Holding Eliquis/aspirin as above for upper GI bleed --Continue to monitor on telemetry --Closely monitor blood pressure   DVT prophylaxis: SCD. Code Status: Full code Family Communication: Daughter Disposition Plan: Home eventually  Consultants:  GI team  Procedures:  For EGD in the future  Antimicrobials:  None   Subjective: Shortness of breath has improved.  Objective: Vitals:   09/12/20 0147 09/12/20 0948 09/12/20 1241 09/12/20 1313  BP: 119/61 (!) 110/49 (!) 129/44 (!) 109/43  Pulse: 60 (!) 59 (!) 59 63  Resp: _0 Temp: 98.4 F (36.9 C) 98.1 F (36.7 C) 97.8 F (36.6 C) 98.7 F (37.1 C)  TempSrc: Oral   Oral  SpO2: 100% 100% 100% 100%  Weight:      Height:        Intake/Output Summary (Last 24 hours) at 09/12/2020 1518 Last data filed at 09/12/2020 1232 Gross per 24 hour  Intake 1709.51 ml  Output 950 ml  Net 759.51 ml   Filed Weights   09/11/20 2142  Weight: 127.1 kg    Examination:  General exam: Appears calm and comfortable.  Patient is morbidly obese. Respiratory system: Decreased air entry globally.  . Cardiovascular system: S1 & S2 heard Gastrointestinal system: Abdomen is obese, soft and nontender.  Organs are not palpable.   Central nervous system: Alert and oriented.  Patient moves all extremities.   Extremities: 1+ to 2 bilateral lower extremity edema.  Data Reviewed: I have personally reviewed following labs and imaging studies  CBC: Recent Labs  Lab 09/11/20 1427 09/11/20 2102 09/12/20 0912  WBC 10.5 9.1  --   NEUTROABS 8.0*  --   --   HGB 4.5* 5.2* 6.7*  HCT 16.7* 18.6* 22.0*  MCV 86.5 86.5  --   PLT 242 225  --    Basic Metabolic Panel: Recent Labs  Lab 09/11/20 1427 09/11/20 2102  NA 138 139  K 4.5 4.6  CL 105 109  CO2 25  26  GLUCOSE 105* 93  BUN 24* 24*  CREATININE 1.00 0.95  CALCIUM 9.8 9.5   GFR: Estimated Creatinine Clearance: 90.2 mL/min (by C-G formula based on SCr of 0.95 mg/dL). Liver Function Tests: Recent Labs  Lab 09/11/20 1534  AST 17  ALT 13  ALKPHOS 44  BILITOT 0.5  PROT 5.5*  ALBUMIN 2.9*   No results for input(s): LIPASE, AMYLASE in the last 168 hours. Recent Labs  Lab 09/11/20 1533  AMMONIA 13   Coagulation Profile: Recent Labs  Lab 09/11/20 2102  INR 1.3*   Cardiac Enzymes: No results for input(s): CKTOTAL, CKMB, CKMBINDEX, TROPONINI in the last 168 hours. BNP (last 3 results) No results for input(s): PROBNP in the last 8760 hours. HbA1C: Recent Labs    09/11/20 2102  HGBA1C 4.7*   CBG: Recent Labs  Lab 09/11/20 2143 09/12/20 0653 09/12/20 1146  GLUCAP 91 97 91   Lipid Profile: No results for input(s): CHOL, HDL, LDLCALC, TRIG, CHOLHDL, LDLDIRECT in the last 72 hours. Thyroid Function Tests: Recent Labs    09/11/20 1534  TSH 1.842  FREET4 0.97   Anemia Panel: Recent Labs    09/11/20 1534 09/11/20 2102  VITAMINB12 914  --   FOLATE 78.9  --   FERRITIN 6*  --  TIBC 515*  --   IRON 21*  --   RETICCTPCT  --  3.3*   Urine analysis:    Component Value Date/Time   COLORURINE YELLOW 11/28/2010 1033   APPEARANCEUR HAZY (A) 11/28/2010 1033   LABSPEC 1.021 11/28/2010 1033   PHURINE 5.5 11/28/2010 1033   GLUCOSEU NEGATIVE 11/28/2010 1033   HGBUR MODERATE (A) 11/28/2010 1033   BILIRUBINUR negative 05/07/2019 1645   KETONESUR NEGATIVE 11/28/2010 1033   PROTEINUR Negative 05/07/2019 1645   PROTEINUR 30 (A) 11/28/2010 1033   UROBILINOGEN 0.2 05/07/2019 1645   UROBILINOGEN 0.2 11/28/2010 1033   NITRITE negative 05/07/2019 1645   NITRITE NEGATIVE 11/28/2010 1033   LEUKOCYTESUR Negative 05/07/2019 1645   Sepsis Labs: _0 (procalcitonin:4,lacticidven:4)  ) Recent Results (from the past 240 hour(s))  Resp Panel by RT-PCR (Flu A&B, Covid)  Nasopharyngeal Swab     Status: None   Collection Time: 09/11/20  5:51 PM   Specimen: Nasopharyngeal Swab; Nasopharyngeal(NP) swabs in vial transport medium  Result Value Ref Range Status   SARS Coronavirus 2 by RT PCR NEGATIVE NEGATIVE Final    Comment: (NOTE) SARS-CoV-2 target nucleic acids are NOT DETECTED.  The SARS-CoV-2 RNA is generally detectable in upper respiratory specimens during the acute phase of infection. The lowest concentration of SARS-CoV-2 viral copies this assay can detect is 138 copies/mL. A negative result does not preclude SARS-Cov-2 infection and should not be used as the sole basis for treatment or other patient management decisions. A negative result may occur with  improper specimen collection/handling, submission of specimen other than nasopharyngeal swab, presence of viral mutation(s) within the areas targeted by this assay, and inadequate number of viral copies(<138 copies/mL). A negative result must be combined with clinical observations, patient history, and epidemiological information. The expected result is Negative.  Fact Sheet for Patients:  EntrepreneurPulse.com.au  Fact Sheet for Healthcare Providers:  IncredibleEmployment.be  This test is no t yet approved or cleared by the [place] FDA and  has been authorized for detection and/or diagnosis of SARS-CoV-2 by FDA under an Emergency Use Authorization (EUA). This EUA will remain  in effect (meaning this test can be used) for the duration of the COVID-19 declaration under Section 564(b)(1) of the Act, 21 U.S.C.section 360bbb-3(b)(1), unless the authorization is terminated  or revoked sooner.       Influenza A by PCR NEGATIVE NEGATIVE Final   Influenza B by PCR NEGATIVE NEGATIVE Final    Comment: (NOTE) The Xpert Xpress SARS-CoV-2/FLU/RSV plus assay is intended as an aid in the diagnosis of influenza from Nasopharyngeal swab specimens and should not be  used as a sole basis for treatment. Nasal washings and aspirates are unacceptable for Xpert Xpress SARS-CoV-2/FLU/RSV testing.  Fact Sheet for Patients: EntrepreneurPulse.com.au  Fact Sheet for Healthcare Providers: IncredibleEmployment.be  This test is not yet approved or cleared by the [place] FDA and has been authorized for detection and/or diagnosis of SARS-CoV-2 by FDA under an Emergency Use Authorization (EUA). This EUA will remain in effect (meaning this test can be used) for the duration of the COVID-19 declaration under Section 564(b)(1) of the Act, 21 U.S.C. section 360bbb-3(b)(1), unless the authorization is terminated or revoked.  Performed at [facility], [place] [address] [place], [place] [address]          Radiology Studies: CT Head Wo Contrast  Result Date: 09/11/2020 CLINICAL DATA:  Mental status change EXAM: CT HEAD WITHOUT CONTRAST TECHNIQUE: Contiguous axial images were obtained from the base of  the skull through the vertex without intravenous contrast. COMPARISON:  None. FINDINGS: Brain: No evidence of acute infarction, hemorrhage, hydrocephalus, extra-axial collection or mass lesion/mass effect. Vascular: Atherosclerotic and physiologic intracranial calcifications. Skull: Normal. Negative for fracture or focal lesion. Sinuses/Orbits: No acute finding. Other: Cerumen partially occludes the right external auditory canal IMPRESSION: No acute findings Electronically Signed   By: Lucrezia Europe M.D.   On: 09/11/2020 15:54   DG Chest Port 1 View  Result Date: 09/11/2020 CLINICAL DATA:  Weakness, lethargy EXAM: PORTABLE CHEST 1 VIEW COMPARISON:  Radiograph 03/25/2020, chest CT 07/14/2009 FINDINGS: Unchanged, enlarged cardiac silhouette with dual chamber pacemaker leads. There are bibasilar opacities, which are persistent on multiple prior exams including chest CT in 2011. No new airspace disease. No acute osseous abnormality.  IMPRESSION: Unchanged, enlarged cardiac silhouette. Persistent bibasilar opacities, seen on multiple prior exams dating back to 2011, which could represent chronic interstitial lung disease. No new airspace disease. Electronically Signed   By: Maurine Simmering M.D.   On: 09/11/2020 15:28   VAS [place] LOWER EXTREMITY VENOUS (DVT)  Result Date: 09/12/2020  Lower Venous DVT Study Patient Name:  Alexandra Bell  Date of Exam:   09/12/2020 Medical Rec #: [id number]         Accession #:    [id number] Date of Birth: Jan 24, 1965         Patient Gender: F Patient Age:   21 years Exam Location:  [facility] Procedure:      VAS [place] LOWER EXTREMITY VENOUS (DVT) Referring Phys: ERIC [place] --------------------------------------------------------------------------------  Indications: Edema.  Limitations: Poor ultrasound/tissue interface and body habitus. Comparison Study: no prior Performing Technologist: Archie Patten RVS  Examination Guidelines: A complete evaluation includes B-mode imaging, spectral Doppler, color Doppler, and power Doppler as needed of all accessible portions of each vessel. Bilateral testing is considered an integral part of a complete examination. Limited examinations for reoccurring indications may be performed as noted. The reflux portion of the exam is performed with the patient in reverse Trendelenburg.  +---------+---------------+---------+-----------+----------+--------------+ RIGHT    CompressibilityPhasicitySpontaneityPropertiesThrombus Aging +---------+---------------+---------+-----------+----------+--------------+ CFV      Full           Yes      Yes                                 +---------+---------------+---------+-----------+----------+--------------+ SFJ      Full                                                        +---------+---------------+---------+-----------+----------+--------------+ FV Prox  Full                                                         +---------+---------------+---------+-----------+----------+--------------+ FV Mid   Full                                                        +---------+---------------+---------+-----------+----------+--------------+ FV  Distal               Yes      Yes                                 +---------+---------------+---------+-----------+----------+--------------+ PFV      Full                                                        +---------+---------------+---------+-----------+----------+--------------+ POP      Full           Yes      Yes                                 +---------+---------------+---------+-----------+----------+--------------+ PTV      Full                                                        +---------+---------------+---------+-----------+----------+--------------+ PERO     Full                                                        +---------+---------------+---------+-----------+----------+--------------+   +---------+---------------+---------+-----------+----------+--------------+ LEFT     CompressibilityPhasicitySpontaneityPropertiesThrombus Aging +---------+---------------+---------+-----------+----------+--------------+ CFV      Full           Yes      Yes                                 +---------+---------------+---------+-----------+----------+--------------+ SFJ      Full                                                        +---------+---------------+---------+-----------+----------+--------------+ FV Prox  Full                                                        +---------+---------------+---------+-----------+----------+--------------+ FV Mid   Full                                                        +---------+---------------+---------+-----------+----------+--------------+ FV DistalFull                                                         +---------+---------------+---------+-----------+----------+--------------+  PFV      Full                                                        +---------+---------------+---------+-----------+----------+--------------+ POP      Full           Yes      Yes                                 +---------+---------------+---------+-----------+----------+--------------+ PTV      Full                                                        +---------+---------------+---------+-----------+----------+--------------+ PERO     Full                                                        +---------+---------------+---------+-----------+----------+--------------+     Summary: BILATERAL: - No evidence of deep vein thrombosis seen in the lower extremities, bilaterally. -No evidence of popliteal cyst, bilaterally.   *See table(s) above for measurements and observations. Electronically signed by Deitra Mayo MD on 09/12/2020 at 3:18:01 PM.    Final         Scheduled Meds:  gabapentin  300 mg Oral TID   insulin aspart  0-5 Units Subcutaneous QHS   insulin aspart  0-9 Units Subcutaneous TID WC   nicotine  7 mg Transdermal Daily   pantoprazole (PROTONIX) IV  40 mg Intravenous Q12H   peg 3350 powder  1 kit Oral Once   Continuous Infusions:   LOS: 1 day    Time spent: 35 minutes    Dana Allan, MD  [facility] Pager #: [phone] 7PM-7AM contact night coverage as above

## 2020-09-12 NOTE — H&P (View-Only) (Signed)
Referring Provider: EDP Primary Care Physician:  [name], FNP Primary Gastroenterologist:  Dr. Olevia Perches  Reason for Consultation:  Anemia and GI bleeding  HPI: Alexandra Bell is a 55 y.o. female with medical history significant of hypertrophic cardiomyopathy s/p ICD, essential hypertension, type 2 diabetes mellitus, chronic diastolic congestive heart failure, paroxysmal atrial fibrillation on Eliquis (last dose 9/3 evening), tobacco use disorder, morbid obesity who presents to [facility] ED with progressive fatigue, weakness, dark stools and shortness of breath over the last 2 weeks.   She tells me that her stools were darker in color than normal, but no black.  No red blood per rectum except for occasional bright red blood on the toilet paper when she wipes.  No abdominal pain, nausea, vomiting, weight loss, loss of appetite, heartburn/reflux, dysphagia.  Does use diclofenac regularly at home for the past year or so.  Hgb 4.5 grams down from 12.2 grams just 5 months ago.  Has received 2 units PRBCs with increase to 6.7 grams.  BUN slightly elevated at 24.  Hemoccult pending.  EGD in 2012 showed moderate gastritis and esophagitis in the entire esophagus.  1. Surgical [P], small bowel, bx FRAGMENTS OF DUODENAL MUCOSA WITH INFLAMMATION AND INCREASED EOSINOPHILS. SEE MICROSCOPIC DESCRIPTION. 2. Surgical [P], gastric, bx GASTRIC MUCOSAL FRAGMENTS WITH SLIGHT INFLAMMATION AND INCREASED EOSINOPHILS. SEE MICROSCOPIC DESCRIPTION. NO HELICOBACTER PYLORI, DYSPLASIA OR MALIGNANCY. 3. Surgical [P], esophagus, bx BENIGN ESOPHAGEAL SQUAMOUS MUCOSA. NO FEATURES OF EOSINOPHILIC ESOPHAGITIS (LESS THAN TWO EOSINOPHILS PER HIGH POWER FIELD), FUNGI, DYSPLASIA OR MALIGNANCY.  Flex sig in 11/2010 showed "colitis".  - COLONIC MUCOSA WITH INTACT CRYPT ARCHITECTURE, LAMINA PROPRIA LYMPHOPLASMACYTIC AND HISTIOCYTIC INFLAMMATION AND ABUNDANT EOSINOPHILS INCLUDING EOSINOPHILIC CRYPTITIS, SEE COMMENT. -  NEGATIVE FOR DYSPLASIA.   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   Fatty liver 11/2010   GERD (gastroesophageal reflux disease)    HOCM (hypertrophic obstructive cardiomyopathy) ([place]) 12/10/2019   Hx: UTI (urinary tract infection)    Hypertension    Inguinal hernia    Morbid obesity ([place]) 12/10/2019   NSVT (nonsustained ventricular tachycardia) ([place]) 03/10/2020   Obesity, Class III, BMI 40-49.9 (morbid obesity) ([place])    Periumbilical hernia     Past Surgical History:  Procedure Laterality Date   ABDOMINAL HYSTERECTOMY     BRONCHOSCOPY, LYMPH NODE BX  MAY 2011   CHOLECYSTECTOMY  2001   ERCP/SPHINCTEROTOMY/STONE EXTRACTION  2001   FLEXIBLE SIGMOIDOSCOPY  11/29/2010   Procedure: FLEXIBLE SIGMOIDOSCOPY;  Surgeon: Lafayette Dragon, MD;  Location: [facility] ENDOSCOPY;  Service: Endoscopy;  Laterality: N/A;   ICD IMPLANT N/A 03/25/2020   Procedure: ICD IMPLANT;  Surgeon: Vickie Epley, MD;  Location: [place] CV LAB;  Service: Cardiovascular;  Laterality: N/A;    Prior to Admission medications   Medication Sig Start Date End Date Taking? Authorizing Provider  acetaminophen (TYLENOL) 500 MG tablet Take 1,500 mg by mouth every 6 (six) hours as needed (For pain).   Yes [provider]  albuterol (VENTOLIN HFA) 108 (90 Base) MCG/ACT inhaler Inhale 2 puffs into the lungs every 6 (six) hours as needed. For asthma 09/07/20  Yes [name], FNP  amLODipine (NORVASC) 10 MG tablet Take 1 tablet (10 mg total) by mouth daily. 07/01/20 09/29/20 Yes Vickie Epley, MD  apixaban (ELIQUIS) 5 MG  TABS tablet Take 1 tablet by mouth twice daily 09/09/20  Yes Skeet Latch, MD  aspirin EC 81 MG tablet Take 81 mg by mouth every morning.   Yes [provider]  carvedilol (COREG) 25 MG tablet Take 1 tablet (25 mg total) by  mouth 2 (two) times daily. 03/10/20 09/11/20 Yes Skeet Latch, MD  dapagliflozin propanediol (FARXIGA) 10 MG TABS tablet Take 1 tablet (10 mg total) by mouth daily before breakfast. 09/07/20  Yes [name], FNP  diclofenac Sodium (VOLTAREN) 1 % GEL APPLY 2 G TOPICALLY FOUR TIMES DAILY. 06/13/20  Yes [name], FNP  Diclofenac Sodium CR 100 MG 24 hr tablet Take 1 tablet (100 mg total) by mouth every morning. 07/28/20  Yes [name], FNP  furosemide (LASIX) 80 MG tablet Take 1 tablet (80 mg total) by mouth daily. 03/10/20  Yes Skeet Latch, MD  gabapentin (NEURONTIN) 300 MG capsule TAKE 1 CAPSULE BY MOUTH THREE TIMES DAILY Patient taking differently: Take 300 mg by mouth 3 (three) times daily. 07/12/20  Yes [name], FNP  lisinopril (ZESTRIL) 40 MG tablet Take 1 tablet (40 mg total) by mouth every morning. 09/09/20  Yes Cleaver, Jossie Ng, NP  Magnesium 250 MG TABS Take 250 mg by mouth daily at 2 PM.   Yes [provider]  melatonin 5 MG TABS Take 5 mg by mouth daily at 2 PM.   Yes [provider]  metFORMIN (GLUCOPHAGE) 500 MG tablet TAKE 1 TABLET BY MOUTH TWICE DAILY WITH A MEAL Patient taking differently: Take 500 mg by mouth 2 (two) times daily with a meal. 06/13/20  Yes [name], FNP  Multiple Vitamin (MULTIVITAMIN WITH MINERALS) TABS tablet Take 1 tablet by mouth in the morning. One-A-Day Women's Multivitamin   Yes [provider]    Current Facility-Administered Medications  Medication Dose Route Frequency Provider Last Rate Last Admin   acetaminophen (TYLENOL) tablet 650 mg  650 mg Oral Q6H PRN [place] Eric J, DO       Or   acetaminophen (TYLENOL) suppository 650 mg  650 mg Rectal Q6H PRN [place] Eric J, DO       albuterol (PROVENTIL) (2.5 MG/3ML) 0.083% nebulizer solution 3 mL  3 mL Inhalation Q6H PRN [place] Eric J, DO       gabapentin (NEURONTIN) capsule 300 mg  300 mg Oral TID [place] Eric J, DO   300 mg at 09/11/20 2136   insulin aspart  (novoLOG) injection 0-5 Units  0-5 Units Subcutaneous QHS [place] Eric J, DO       insulin aspart (novoLOG) injection 0-9 Units  0-9 Units Subcutaneous TID WC [place] Eric J, DO       lactated ringers infusion   Intravenous Continuous Chotiner, Yevonne Aline, MD 75 mL/hr at 09/12/20 0348 New Bag at 09/12/20 0348   nicotine (NICODERM CQ - dosed in mg/24 hr) patch 7 mg  7 mg Transdermal Daily [place] Eric J, DO       ondansetron [facility]) tablet 4 mg  4 mg Oral Q6H PRN [place] J, DO       Or   ondansetron [facility]) injection 4 mg  4 mg Intravenous Q6H PRN [place] Donnamarie Poag, DO       [START ON 09/15/2020] pantoprazole (PROTONIX) injection 40 mg  40 mg Intravenous Q12H [place] Eric J, DO       pantoprozole (PROTONIX) 80 mg /NS 100 mL infusion  8 mg/hr Intravenous Continuous [place] Eric J, DO 10 mL/hr at 09/12/20  [id number] 8 mg/hr at 09/12/20 [id number]    Allergies as of 09/11/2020   (No Known Allergies)    Family History  Problem Relation Age of Onset   Diabetes Mother    Colon cancer Father        ?   Heart attack Father    Diabetes Sister     Social History   Socioeconomic History   Marital status: Married    Spouse name: Not on file   Number of children: 2   Years of education: Not on file   Highest education level: Not on file  Occupational History   Occupation: Food prep, Metallurgist: UNEMPLOYED  Tobacco Use   Smoking status: Every Day    Packs/day: 0.50    Years: 30.00    Pack years: 15.00    Types: Cigarettes   Smokeless tobacco: Never   Tobacco comments:    she is less than a PPD - states down to 6 cigarettes a day - 10/14  Vaping Use   Vaping Use: Never used  Substance and Sexual Activity   Alcohol use: No   Drug use: No   Sexual activity: Not on file  Other Topics Concern   Not on file  Social History Narrative   Not on file   Social Determinants of Health   Financial Resource Strain: Not on file  Food Insecurity: Not on file  Transportation Needs: Not  on file  Physical Activity: Not on file  Stress: Not on file  Social Connections: Not on file  Intimate Partner Violence: Not on file    Review of Systems: ROS is O/W negative except as mentioned in HPI.  Physical Exam: Vital signs in last 24 hours: Temp:  [97.5 F (36.4 C)-98.4 F (36.9 C)] 98.4 F (36.9 C) (09/05 0147) Pulse Rate:  [58-67] 60 (09/05 0147) Resp:  [11-22] 19 (09/05 0147) BP: (99-143)/(44-61) 119/61 (09/05 0147) SpO2:  [96 %-100 %] 100 % (09/05 0147) Weight:  [127.1 kg] 127.1 kg (09/04 2142)   General:  Alert, Well-developed, well-nourished, pleasant and cooperative in NAD Head:  Normocephalic and atraumatic. Eyes:  Sclera clear, no icterus.  Conjunctiva pink. Ears:  Normal auditory acuity. Mouth:  No deformity or lesions.   Lungs:  Clear throughout to auscultation.  No wheezes, crackles, or rhonchi.  Heart:  Regular rate and rhythm; SEM noted. Abdomen:  Soft, obese. Non-distended.  BS present.  Non-tender. Msk:  Symmetrical without gross deformities. Pulses:  Normal pulses noted. Extremities:  2-3+ pitting edema in B/L LEs, R>L. Neurologic:  Alert and oriented x 4;  grossly normal neurologically. Skin:  Intact without significant lesions or rashes. Psych:  Alert and cooperative. Normal mood and affect.  Intake/Output from previous day: 09/04 0701 - 09/05 0700 In: 1349.5 [P.O.:100; I.V.:304.5; Blood:945] Out: -  Intake/Output this shift: Total I/O In: 0  Out: 150 [Urine:150]  Lab Results: Recent Labs    09/11/20 1427 09/11/20 2102  WBC 10.5 9.1  HGB 4.5* 5.2*  HCT 16.7* 18.6*  PLT 242 225   BMET Recent Labs    09/11/20 1427 09/11/20 2102  NA 138 139  K 4.5 4.6  CL 105 109  CO2 25 26  GLUCOSE 105* 93  BUN 24* 24*  CREATININE 1.00 0.95  CALCIUM 9.8 9.5   LFT Recent Labs    09/11/20 1534  PROT 5.5*  ALBUMIN 2.9*  AST 17  ALT 13  ALKPHOS 44  BILITOT 0.5  BILIDIR <0.1  IBILI  NOT CALCULATED   PT/INR Recent Labs     09/11/20 2102  LABPROT 16.4*  INR 1.3*    Studies/Results: CT Head Wo Contrast  Result Date: 09/11/2020 CLINICAL DATA:  Mental status change EXAM: CT HEAD WITHOUT CONTRAST TECHNIQUE: Contiguous axial images were obtained from the base of the skull through the vertex without intravenous contrast. COMPARISON:  None. FINDINGS: Brain: No evidence of acute infarction, hemorrhage, hydrocephalus, extra-axial collection or mass lesion/mass effect. Vascular: Atherosclerotic and physiologic intracranial calcifications. Skull: Normal. Negative for fracture or focal lesion. Sinuses/Orbits: No acute finding. Other: Cerumen partially occludes the right external auditory canal IMPRESSION: No acute findings Electronically Signed   By: Lucrezia Europe M.D.   On: 09/11/2020 15:54   DG Chest Port 1 View  Result Date: 09/11/2020 CLINICAL DATA:  Weakness, lethargy EXAM: PORTABLE CHEST 1 VIEW COMPARISON:  Radiograph 03/25/2020, chest CT 07/14/2009 FINDINGS: Unchanged, enlarged cardiac silhouette with dual chamber pacemaker leads. There are bibasilar opacities, which are persistent on multiple prior exams including chest CT in 2011. No new airspace disease. No acute osseous abnormality. IMPRESSION: Unchanged, enlarged cardiac silhouette. Persistent bibasilar opacities, seen on multiple prior exams dating back to 2011, which could represent chronic interstitial lung disease. No new airspace disease. Electronically Signed   By: Maurine Simmering M.D.   On: 09/11/2020 15:28    IMPRESSION:  *GI bleed:  Suspect upper source (likely subacute bleed) with darker colored stools (but not black) and long-standing NSAID use with diclofenac.  BUN slightly elevated at 24.  Hemoccult pending. *ALBA/iron deficiency anemia:  Secondary to above.  Hgb 4.5 grams on admission compared to 12.2 grams just 5 months ago.  Received 2 units PRBCs.  Hgb up to 6.7 grams. *PAF on Eliquis with last dose then evening of 9/3. *NSAID use as above  PLAN: -Continue  PPI gtt for now. -Monitor Hgb and transfuse further for Hgb > 7-7.5 grams. -Would benefit from IV iron infusion while inpatient. -EGD and colonoscopy with Dr. Lyndel Safe on 9/6.   Laban Emperor. Zehr  09/12/2020, 9:15 AM  ________________________________________________________________________  Velora Heckler GI MD note:  I personally examined the patient, reviewed the data and agree with the assessment and plan described above.  55 yo woman with multiple med issues, on eliquis. Has had darker than usual stools for the past few weeks but no other GI symptoms.  No abd pain, no changes in her bowels, no weight loss, no nausea or vomiting. Does very occasionally see minor BRBPR with wiping.  Hb 4.5 on admit, normal MCV. Last eliquis ws two days ago. EGD colonsocoyp 2012 see above.  She is HD stable.  She understands that we are planning for EGD and colonoscopy tomorrow with Dr. Lyndel Safe for further evaluation.  I think PPI IV BID should be safe and will stop the drip for now.   Owens Loffler, MD [facility] Gastroenterology Pager [phone]

## 2020-09-13 ENCOUNTER — Inpatient Hospital Stay (HOSPITAL_COMMUNITY): Payer: 59 | Admitting: Anesthesiology

## 2020-09-13 ENCOUNTER — Encounter (HOSPITAL_COMMUNITY)
Admission: EM | Disposition: A | Discharge status: Home / Self Care | Payer: Self-pay | Source: Home / Self Care | Attending: Internal Medicine

## 2020-09-13 ENCOUNTER — Encounter (HOSPITAL_COMMUNITY): Payer: Self-pay | Admitting: Internal Medicine

## 2020-09-13 DIAGNOSIS — K269 Duodenal ulcer, unspecified as acute or chronic, without hemorrhage or perforation: Secondary | ICD-10-CM

## 2020-09-13 DIAGNOSIS — K2970 Gastritis, unspecified, without bleeding: Secondary | ICD-10-CM

## 2020-09-13 HISTORY — PX: COLONOSCOPY: SHX5424

## 2020-09-13 HISTORY — PX: BIOPSY: SHX5522

## 2020-09-13 HISTORY — PX: ESOPHAGOGASTRODUODENOSCOPY (EGD) WITH PROPOFOL: SHX5813

## 2020-09-13 LAB — TYPE AND SCREEN
ABO/RH(D): A POS
Antibody Screen: NEGATIVE
Unit division: 0
Unit division: 0
Unit division: 0

## 2020-09-13 LAB — BPAM RBC
Blood Product Expiration Date: 202209292359
Blood Product Expiration Date: 202210022359
Blood Product Expiration Date: 202210022359
ISSUE DATE / TIME: 202209041732
ISSUE DATE / TIME: 202209042229
ISSUE DATE / TIME: 202209051250
Unit Type and Rh: 6200
Unit Type and Rh: 6200
Unit Type and Rh: 6200

## 2020-09-13 LAB — GLUCOSE, CAPILLARY
Glucose-Capillary: 95 mg/dL (ref 70–99)
Glucose-Capillary: 71 mg/dL (ref 70–99)
Glucose-Capillary: 103 mg/dL — ABNORMAL HIGH (ref 70–99)
Glucose-Capillary: 115 mg/dL — ABNORMAL HIGH (ref 70–99)

## 2020-09-13 SURGERY — ESOPHAGOGASTRODUODENOSCOPY (EGD) WITH PROPOFOL
Anesthesia: Monitor Anesthesia Care

## 2020-09-13 MED ORDER — PROPOFOL 500 MG/50ML IV EMUL
INTRAVENOUS | Status: DC | PRN
  Administered 2020-09-13: 75 ug/kg/min via INTRAVENOUS

## 2020-09-13 MED ORDER — LACTATED RINGERS IV SOLN
INTRAVENOUS | Status: DC | PRN
Start: 2020-09-13 — End: 2020-09-13

## 2020-09-13 MED ORDER — PROPOFOL 10 MG/ML IV BOLUS
INTRAVENOUS | Status: DC | PRN
  Administered 2020-09-13 (×2): 20 mg via INTRAVENOUS

## 2020-09-13 SURGICAL SUPPLY — 25 items

## 2020-09-13 NOTE — Op Note (Addendum)
[facility] Patient Name: Alexandra Bell Procedure Date : 09/13/2020 MRN: [id number] Attending MD: Jackquline Denmark , MD Date of Birth: 1965/01/29 CSN: [id number] Age: 55 Admit Type: Inpatient Procedure:                Colonoscopy Indications:              Gastrointestinal bleeding Providers:                Jackquline Denmark, MD, Jerene Pitch Person, Tyna Jaksch                            Technician Referring MD:              Medicines:                Monitored Anesthesia Care Complications:            No immediate complications. Estimated Blood Loss:     Estimated blood loss: none. Procedure:                Pre-Anesthesia Assessment:                           - Prior to the procedure, a History and Physical                            was performed, and patient medications and                            allergies were reviewed. The patient's tolerance of                            previous anesthesia was also reviewed. The risks                            and benefits of the procedure and the sedation                            options and risks were discussed with the patient.                            All questions were answered, and informed consent                            was obtained. Prior Anticoagulants: The patient has                            taken Eliquis (apixaban), last dose was 2 days                            prior to procedure. ASA Grade Assessment: IV - A                            patient with severe systemic disease that is a  constant threat to life. After reviewing the risks                            and benefits, the patient was deemed in                            satisfactory condition to undergo the procedure.                           After obtaining informed consent, the colonoscope                            was passed under direct vision. Throughout the                            procedure, the patient's blood pressure,  pulse, and                            oxygen saturations were monitored continuously. The                            [id number] Olympus colonoscope was                            introduced through the anus and advanced to the 2                            cm into the ileum. The colonoscopy was performed                            without difficulty. The patient tolerated the                            procedure well. The quality of the bowel                            preparation was good. The terminal ileum, ileocecal                            valve, appendiceal orifice, and rectum were                            photographed. Scope In: 9:48:33 AM Scope Out: 10:03:24 AM Scope Withdrawal Time: 0 hours 9 minutes 5 seconds  Total Procedure Duration: 0 hours 14 minutes 51 seconds  Findings:      The colon (entire examined portion) appeared normal.      Non-bleeding internal hemorrhoids were found during retroflexion and       during perianal exam. The hemorrhoids were moderate and Grade I       (internal hemorrhoids that do not prolapse).      The terminal ileum appeared normal.      The exam was otherwise without abnormality on direct and retroflexion       views. Impression:               -  Non-bleeding internal hemorrhoids.                           - Otherwise normal colonoscopy to TI.                           - No specimens collected. Recommendation:           - Return patient to hospital ward for ongoing care.                           - Resume previous diet.                           - Repeat colonoscopy in 10 years for screening                            purposes. Earlier if clinically indicated or change                            in family history.                           - Resume Eliquis (apixaban) at prior dose tomorrow                            as she had EGD with biopsies prior to colonoscopy.                           - Trend CBC.                            - Will standby for now. Please call if with any                            questions or concerns.                           - The findings and recommendations were discussed                            with the patient. Procedure Code(s):        --- Professional ---                           [phone], Colonoscopy, flexible; diagnostic, including                            collection of specimen(s) by brushing or washing,                            when performed (separate procedure) Diagnosis Code(s):        --- Professional ---                           K64.0, First degree hemorrhoids  K92.2, Gastrointestinal hemorrhage, unspecified CPT copyright 2019 American Medical Association. All rights reserved. The codes documented in this report are preliminary and upon coder review may  be revised to meet current compliance requirements. Jackquline Denmark, MD 09/13/2020 10:10:43 AM This report has been signed electronically. Number of Addenda: 0

## 2020-09-13 NOTE — Transfer of Care (Signed)
Immediate Anesthesia Transfer of Care Note  Patient: Alexandra Bell  Procedure(s) Performed: ESOPHAGOGASTRODUODENOSCOPY (EGD) WITH PROPOFOL BIOPSY COLONOSCOPY WITH PROPOFOL  Patient Location: Endoscopy Unit  Anesthesia Type:MAC  Level of Consciousness: awake, alert  and oriented  Airway & Oxygen Therapy: Patient Spontanous Breathing and Patient connected to nasal cannula oxygen  Post-op Assessment: Report given to RN and Post -op Vital signs reviewed and stable  Post vital signs: Reviewed and stable  Last Vitals:  Vitals Value Taken Time  BP 91/53   Temp    Pulse 74   Resp    SpO2 99     Last Pain:  Vitals:   09/13/20 0806  TempSrc: Oral  PainSc: 0-No pain         Complications: No notable events documented.

## 2020-09-13 NOTE — Anesthesia Procedure Notes (Signed)
Procedure Name: MAC Date/Time: 09/13/2020 9:19 AM Performed by: Carolan Clines, CRNA Pre-anesthesia Checklist: Patient identified, Emergency Drugs available, Suction available and Patient being monitored Patient Re-evaluated:Patient Re-evaluated prior to induction Oxygen Delivery Method: Nasal cannula Dental Injury: Teeth and Oropharynx as per pre-operative assessment

## 2020-09-13 NOTE — Plan of Care (Signed)
  Problem: Nutrition: Goal: Adequate nutrition will be maintained Outcome: Progressing   Problem: Elimination: Goal: Will not experience complications related to urinary retention Outcome: Progressing   

## 2020-09-13 NOTE — Anesthesia Postprocedure Evaluation (Signed)
Anesthesia Post Note  Patient: Alexandra Bell  Procedure(s) Performed: ESOPHAGOGASTRODUODENOSCOPY (EGD) WITH PROPOFOL BIOPSY COLONOSCOPY WITH PROPOFOL     Patient location during evaluation: Endoscopy Anesthesia Type: MAC Level of consciousness: awake and alert Pain management: pain level controlled Vital Signs Assessment: post-procedure vital signs reviewed and stable Respiratory status: spontaneous breathing, nonlabored ventilation, respiratory function stable and patient connected to nasal cannula oxygen Cardiovascular status: blood pressure returned to baseline and stable Postop Assessment: no apparent nausea or vomiting Anesthetic complications: no   No notable events documented.  Last Vitals:  Vitals:   09/13/20 1023 09/13/20 1033  BP: (!) 157/59 (!) 174/65  Pulse: 88 67  Resp: 19 18  Temp:    SpO2: 98% 94%    Last Pain:  Vitals:   09/13/20 1033  TempSrc:   PainSc: 0-No pain                 Merlinda Frederick

## 2020-09-13 NOTE — Progress Notes (Signed)
PROGRESS NOTE  Alexandra ZUNKER [id number] DOB: 1965-11-08 DOA: 09/11/2020 PCP: [name], FNP  HPI/Recap of past 24 hours: This is a 55 year old African-American female with past medical history significant for hypertrophic cardiomyopathy status post ICD, essential hypertension, type 2 diabetes mellitus, chronic diastolic dysfunction heart failure, paroxysmal atrial fibrillation on Eliquis, tobacco use disorder, morbid obesity, she was admitted for symptomatic anemia hemoglobin initially was 4.9 on presentation and it was around 12.6 about 5 months ago.  Patient was on Eliquis diclofenac aspirin prior to admission she was transfused with 2 units packed RBC hemoglobin came up to 6.7 she was transfused with additional 1 unit GI was consulted and they proceeded to do her EGD today. Subjective Patient seen and examined at bedside she has just returned from EGD she denies any complaints:  Assessment/Plan: Principal Problem:   Upper GI bleed Active Problems:   Essential hypertension   HOCM (hypertrophic obstructive cardiomyopathy) (HCC)   Morbid obesity (HCC)   AF (paroxysmal atrial fibrillation) ([place])   Tobacco use disorder   Type 2 diabetes mellitus ([place])   Diabetic neuropathy ([place])   1.  Acute blood loss anemia prior to admission Upper GI bleed Symptomatic anemia.  Patient underwent EGD and colonoscopy this morning EGD revealed 2 duodenal ulcers with no bleeding. They feel that this is as a result of a combination of Eliquis and NSAID use GI okay to advance her diet. She is received Unit of blood transfusion today Continue to monitor H&H  2.  Acute hypoxic respiratory failure prior to admission this is likely secondary to symptomatic anemia Patient has received additional unit blood transfusion t Continue supplemental oxygen  3.  Paroxysmal atrial fibrillation and hypertension Patient was slightly hypotensive on admission with a GI bleed so her home antihypertensive were  held Eliquis and aspirin are being held due to upper GI bleed Continue telemetry monitoring  4.  Chronic diastolic congestive heart failure Hypertrophic cardiomyopathy status post ICD Currently her carvedilol and furosemide are on hold due to hypotension  5.  Type 2 diabetes mellitus Patient was on metformin this is on hold continue sliding scale insulin  6.  Peripheral neuropathy is most likely secondary to diabetes mellitus continue gabapentin  7.  Tobacco abuse Continue nicotine patch  8.  Morbid obesity BMI is 44.5     Code Status: Full  Severity of Illness: The appropriate patient status for this patient is INPATIENT. Inpatient status is judged to be reasonable and necessary in order to provide the required intensity of service to ensure the patient's safety. The patient's presenting symptoms, physical exam findings, and initial radiographic and laboratory data in the context of their chronic comorbidities is felt to place them at high risk for further clinical deterioration. Furthermore, it is not anticipated that the patient will be medically stable for discharge from the hospital within 2 midnights of admission. The following factors support the patient status of inpatient.  GI bleed severe symptomatic anemia   * I certify that at the point of admission it is my clinical judgment that the patient will require inpatient hospital care spanning beyond 2 midnights from the point of admission due to high intensity of service, high risk for further deterioration and high frequency of surveillance required.*   Family Communication: Patient and son Gerald Stabs at bedside  Disposition Plan: Home in few days Status is: Inpatient   Dispo: The patient is from: Home              Anticipated  d/c is to:               Anticipated d/c date is:               Patient currently not medically stable for discharge  Consultants: GI  Procedures: EGD and colonoscopy Venous Doppler  ultrasound  Antimicrobials: None  DVT prophylaxis: SCD   Objective: Vitals:   09/12/20 1603 09/12/20 2131 09/13/20 0530 09/13/20 0806  BP: (!) 122/52 (!) 131/56 (!) 145/56 (!) 172/59  Pulse: 65 (!) 59 64 68  Resp: '18 19 18 16  '$ Temp: 98 F (36.7 C) 98.5 F (36.9 C) 98.4 F (36.9 C) 97.9 F (36.6 C)  TempSrc: Oral Oral Oral Oral  SpO2: 100% 100% 100% 100%  Weight:  127.1 kg  125.2 kg  Height:    '5\' 6"'$  (1.676 m)    Intake/Output Summary (Last 24 hours) at 09/13/2020 0924 Last data filed at 09/13/2020 0545 Gross per 24 hour  Intake 2284 ml  Output 800 ml  Net 1484 ml   Filed Weights   09/11/20 2142 09/12/20 2131 09/13/20 0806  Weight: 127.1 kg 127.1 kg 125.2 kg   Body mass index is 44.55 kg/m.  Exam:  General: 55 y.o. year-old female well developed well nourished in no acute distress.  Alert and oriented x3. Cardiovascular: Regular rate and rhythm with no rubs or gallops.  No thyromegaly or JVD noted.   Respiratory: Clear to auscultation with no wheezes or rales. Good inspiratory effort. Abdomen: Soft nontender nondistended with normal bowel sounds x4 quadrants. Musculoskeletal: No lower extremity edema. 2/4 pulses in all 4 extremities. Skin: No ulcerative lesions noted or rashes, Psychiatry: Mood is appropriate for condition and setting Neurology:    Data Reviewed: CBC: Recent Labs  Lab 09/11/20 1427 09/11/20 2102 09/12/20 0912 09/12/20 1500 09/12/20 1835  WBC 10.5 9.1  --   --   --   NEUTROABS 8.0*  --   --   --   --   HGB 4.5* 5.2* 6.7* 7.4* 7.6*  HCT 16.7* 18.6* 22.0* 24.9* 25.5*  MCV 86.5 86.5  --   --   --   PLT 242 225  --   --   --    Basic Metabolic Panel: Recent Labs  Lab 09/11/20 1427 09/11/20 2102  NA 138 139  K 4.5 4.6  CL 105 109  CO2 25 26  GLUCOSE 105* 93  BUN 24* 24*  CREATININE 1.00 0.95  CALCIUM 9.8 9.5   GFR: Estimated Creatinine Clearance: 89.5 mL/min (by C-G formula based on SCr of 0.95 mg/dL). Liver Function  Tests: Recent Labs  Lab 09/11/20 1534  AST 17  ALT 13  ALKPHOS 44  BILITOT 0.5  PROT 5.5*  ALBUMIN 2.9*   No results for input(s): LIPASE, AMYLASE in the last 168 hours. Recent Labs  Lab 09/11/20 1533  AMMONIA 13   Coagulation Profile: Recent Labs  Lab 09/11/20 2102  INR 1.3*   Cardiac Enzymes: No results for input(s): CKTOTAL, CKMB, CKMBINDEX, TROPONINI in the last 168 hours. BNP (last 3 results) No results for input(s): PROBNP in the last 8760 hours. HbA1C: Recent Labs    09/11/20 2102  HGBA1C 4.7*   CBG: Recent Labs  Lab 09/12/20 1146 09/12/20 1648 09/12/20 2132 09/12/20 2221 09/13/20 0636  GLUCAP 91 114* 62* 129* 95   Lipid Profile: No results for input(s): CHOL, HDL, LDLCALC, TRIG, CHOLHDL, LDLDIRECT in the last 72 hours. Thyroid Function Tests: Recent Labs  09/11/20 1534  TSH 1.842  FREET4 0.97   Anemia Panel: Recent Labs    09/11/20 1534 09/11/20 2102  VITAMINB12 914  --   FOLATE 78.9  --   FERRITIN 6*  --   TIBC 515*  --   IRON 21*  --   RETICCTPCT  --  3.3*   Urine analysis:    Component Value Date/Time   COLORURINE YELLOW 11/28/2010 1033   APPEARANCEUR HAZY (A) 11/28/2010 1033   LABSPEC 1.021 11/28/2010 1033   PHURINE 5.5 11/28/2010 1033   GLUCOSEU NEGATIVE 11/28/2010 1033   HGBUR MODERATE (A) 11/28/2010 1033   BILIRUBINUR negative 05/07/2019 1645   KETONESUR NEGATIVE 11/28/2010 1033   PROTEINUR Negative 05/07/2019 1645   PROTEINUR 30 (A) 11/28/2010 1033   UROBILINOGEN 0.2 05/07/2019 1645   UROBILINOGEN 0.2 11/28/2010 1033   NITRITE negative 05/07/2019 1645   NITRITE NEGATIVE 11/28/2010 1033   LEUKOCYTESUR Negative 05/07/2019 1645   Sepsis Labs: '@LABRCNTIP'$ (procalcitonin:4,lacticidven:4)  ) Recent Results (from the past 240 hour(s))  Resp Panel by RT-PCR (Flu A&B, Covid) Nasopharyngeal Swab     Status: None   Collection Time: 09/11/20  5:51 PM   Specimen: Nasopharyngeal Swab; Nasopharyngeal(NP) swabs in vial  transport medium  Result Value Ref Range Status   SARS Coronavirus 2 by RT PCR NEGATIVE NEGATIVE Final    Comment: (NOTE) SARS-CoV-2 target nucleic acids are NOT DETECTED.  The SARS-CoV-2 RNA is generally detectable in upper respiratory specimens during the acute phase of infection. The lowest concentration of SARS-CoV-2 viral copies this assay can detect is 138 copies/mL. A negative result does not preclude SARS-Cov-2 infection and should not be used as the sole basis for treatment or other patient management decisions. A negative result may occur with  improper specimen collection/handling, submission of specimen other than nasopharyngeal swab, presence of viral mutation(s) within the areas targeted by this assay, and inadequate number of viral copies(<138 copies/mL). A negative result must be combined with clinical observations, patient history, and epidemiological information. The expected result is Negative.  Fact Sheet for Patients:  EntrepreneurPulse.com.au  Fact Sheet for Healthcare Providers:  IncredibleEmployment.be  This test is no t yet approved or cleared by the [place] FDA and  has been authorized for detection and/or diagnosis of SARS-CoV-2 by FDA under an Emergency Use Authorization (EUA). This EUA will remain  in effect (meaning this test can be used) for the duration of the COVID-19 declaration under Section 564(b)(1) of the Act, 21 U.S.C.section 360bbb-3(b)(1), unless the authorization is terminated  or revoked sooner.       Influenza A by PCR NEGATIVE NEGATIVE Final   Influenza B by PCR NEGATIVE NEGATIVE Final    Comment: (NOTE) The Xpert Xpress SARS-CoV-2/FLU/RSV plus assay is intended as an aid in the diagnosis of influenza from Nasopharyngeal swab specimens and should not be used as a sole basis for treatment. Nasal washings and aspirates are unacceptable for Xpert Xpress SARS-CoV-2/FLU/RSV testing.  Fact  Sheet for Patients: EntrepreneurPulse.com.au  Fact Sheet for Healthcare Providers: IncredibleEmployment.be  This test is not yet approved or cleared by the [place] FDA and has been authorized for detection and/or diagnosis of SARS-CoV-2 by FDA under an Emergency Use Authorization (EUA). This EUA will remain in effect (meaning this test can be used) for the duration of the COVID-19 declaration under Section 564(b)(1) of the Act, 21 U.S.C. section 360bbb-3(b)(1), unless the authorization is terminated or revoked.  Performed at [facility], [place] [address] [place], [place] [address]  Studies: VAS [place] LOWER EXTREMITY VENOUS (DVT)  Result Date: 09/12/2020  Lower Venous DVT Study Patient Name:  RUBYANN GRIEB  Date of Exam:   09/12/2020 Medical Rec #: [id number]         Accession #:    [id number] Date of Birth: [birth year]         Patient Gender: F Patient Age:   44 years Exam Location:  [facility] Procedure:      VAS [place] LOWER EXTREMITY VENOUS (DVT) Referring Phys: ERIC [place] --------------------------------------------------------------------------------  Indications: Edema.  Limitations: Poor ultrasound/tissue interface and body habitus. Comparison Study: no prior Performing Technologist: Archie Patten RVS  Examination Guidelines: A complete evaluation includes B-mode imaging, spectral Doppler, color Doppler, and power Doppler as needed of all accessible portions of each vessel. Bilateral testing is considered an integral part of a complete examination. Limited examinations for reoccurring indications may be performed as noted. The reflux portion of the exam is performed with the patient in reverse Trendelenburg.  +---------+---------------+---------+-----------+----------+--------------+ RIGHT    CompressibilityPhasicitySpontaneityPropertiesThrombus Aging +---------+---------------+---------+-----------+----------+--------------+  CFV      Full           Yes      Yes                                 +---------+---------------+---------+-----------+----------+--------------+ SFJ      Full                                                        +---------+---------------+---------+-----------+----------+--------------+ FV Prox  Full                                                        +---------+---------------+---------+-----------+----------+--------------+ FV Mid   Full                                                        +---------+---------------+---------+-----------+----------+--------------+ FV Distal               Yes      Yes                                 +---------+---------------+---------+-----------+----------+--------------+ PFV      Full                                                        +---------+---------------+---------+-----------+----------+--------------+ POP      Full           Yes      Yes                                 +---------+---------------+---------+-----------+----------+--------------+ PTV  Full                                                        +---------+---------------+---------+-----------+----------+--------------+ PERO     Full                                                        +---------+---------------+---------+-----------+----------+--------------+   +---------+---------------+---------+-----------+----------+--------------+ LEFT     CompressibilityPhasicitySpontaneityPropertiesThrombus Aging +---------+---------------+---------+-----------+----------+--------------+ CFV      Full           Yes      Yes                                 +---------+---------------+---------+-----------+----------+--------------+ SFJ      Full                                                        +---------+---------------+---------+-----------+----------+--------------+ FV Prox  Full                                                         +---------+---------------+---------+-----------+----------+--------------+ FV Mid   Full                                                        +---------+---------------+---------+-----------+----------+--------------+ FV DistalFull                                                        +---------+---------------+---------+-----------+----------+--------------+ PFV      Full                                                        +---------+---------------+---------+-----------+----------+--------------+ POP      Full           Yes      Yes                                 +---------+---------------+---------+-----------+----------+--------------+ PTV      Full                                                        +---------+---------------+---------+-----------+----------+--------------+  PERO     Full                                                        +---------+---------------+---------+-----------+----------+--------------+     Summary: BILATERAL: - No evidence of deep vein thrombosis seen in the lower extremities, bilaterally. -No evidence of popliteal cyst, bilaterally.   *See table(s) above for measurements and observations. Electronically signed by Deitra Mayo MD on 09/12/2020 at 3:18:01 PM.    Final     Scheduled Meds:  [MAR Hold] gabapentin  300 mg Oral TID   [MAR Hold] insulin aspart  0-5 Units Subcutaneous QHS   [MAR Hold] insulin aspart  0-9 Units Subcutaneous TID WC   [MAR Hold] nicotine  7 mg Transdermal Daily   [MAR Hold] pantoprazole (PROTONIX) IV  40 mg Intravenous Q12H    Continuous Infusions:   LOS: 2 days     Cristal Deer, MD [facility]  To reach me or the doctor on call, go to: [email or web address] Password [facility]  09/13/2020, 9:24 AM

## 2020-09-13 NOTE — Op Note (Signed)
[facility] Patient Name: Alexandra Bell Procedure Date : 09/13/2020 MRN: [id number] Attending MD: Jackquline Denmark , MD Date of Birth: [birth year] CSN: [id number] Age: 55 Admit Type: Inpatient Procedure:                Upper GI endoscopy Indications:              UGI bleed with melena. H/O NSAID use with                            diclofenac. Providers:                Jackquline Denmark, MD, Mariana Arn, Tyna Jaksch                            Technician Referring MD:              Medicines:                Monitored Anesthesia Care Complications:            No immediate complications. Estimated Blood Loss:     Estimated blood loss: none. Procedure:                Pre-Anesthesia Assessment:                           - Prior to the procedure, a History and Physical                            was performed, and patient medications and                            allergies were reviewed. The patient's tolerance of                            previous anesthesia was also reviewed. The risks                            and benefits of the procedure and the sedation                            options and risks were discussed with the patient.                            All questions were answered, and informed consent                            was obtained. Prior Anticoagulants: The patient has                            taken Eliquis (apixaban), last dose was 2 days                            prior to procedure. ASA Grade Assessment: IV - A  patient with severe systemic disease that is a                            constant threat to life. After reviewing the risks                            and benefits, the patient was deemed in                            satisfactory condition to undergo the procedure.                           After obtaining informed consent, the endoscope was                            passed under direct vision. Throughout the                             procedure, the patient's blood pressure, pulse, and                            oxygen saturations were monitored continuously. The                            [id number]) Olympus endoscope was introduced                            through the mouth, and advanced to the second part                            of duodenum. The upper GI endoscopy was                            accomplished without difficulty. The patient                            tolerated the procedure well. Scope In: Scope Out: Findings:      The examined esophagus was normal with well-defined Z-line at 38 cm.      Localized mild inflammation characterized by erythema was found in the       gastric antrum. Biopsies were taken with a cold forceps for histology.      Three non-bleeding cratered duodenal ulcers with a clean ulcer base       (Forrest Class III) were found in the duodenal bulb and in the first       portion of the duodenum. The largest lesion was 10 mm in largest       dimension. Impression:               - Non-bleeding duodenal ulcers with a clean ulcer                            base (Forrest Class III).                           -  Mild gastritis.                           - No active bleeding. Recommendation:           - Return patient to hospital ward for ongoing care.                           - Resume previous diet.                           - Continue present medications including Protonix                            40 mg p.o. twice daily x 8 weeks, then once a day                            indefinitely.                           - Await pathology results for HP.                           - Avoid nonsteroidals.                           - Proceed with colonoscopy. Procedure Code(s):        --- Professional ---                           [phone], Esophagogastroduodenoscopy, flexible,                            transoral; with biopsy, single or multiple Diagnosis Code(s):         --- Professional ---                           K29.70, Gastritis, unspecified, without bleeding                           K26.9, Duodenal ulcer, unspecified as acute or                            chronic, without hemorrhage or perforation                           K92.1, Melena (includes Hematochezia) CPT copyright 2019 American Medical Association. All rights reserved. The codes documented in this report are preliminary and upon coder review may  be revised to meet current compliance requirements. Jackquline Denmark, MD 09/13/2020 10:06:21 AM This report has been signed electronically. Number of Addenda: 0

## 2020-09-13 NOTE — Anesthesia Preprocedure Evaluation (Addendum)
Anesthesia Evaluation  Patient identified by MRN, date of birth, ID band Patient awake    Reviewed: Allergy & Precautions, NPO status , Patient's Chart, lab work & pertinent test results  Airway Mallampati: III  TM Distance: >3 FB Neck ROM: Full    Dental  (+) Missing,    Pulmonary Current Smoker,    Pulmonary exam normal breath sounds clear to auscultation       Cardiovascular hypertension, +CHF (HOCM)  + pacemaker + Cardiac Defibrillator  Rhythm:Regular Rate:Normal   1. Left ventricular ejection fraction, by estimation, is 65 to 70%. The  left ventricle has normal function. The left ventricle has no regional  wall motion abnormalities. There is severe concentric left ventricular  hypertrophy. Left ventricular diastolic  parameters are consistent with Grade II diastolic dysfunction  (pseudonormalization).  2. Right ventricular systolic function is normal. The right ventricular  size is normal.  3. Left atrial size was moderately dilated.  4. Right atrial size was moderately dilated.  5. The mitral valve is normal in structure. No evidence of mitral valve  regurgitation.  6. The aortic valve is normal in structure. Aortic valve regurgitation is  not visualized.    Neuro/Psych negative neurological ROS  negative psych ROS   GI/Hepatic GERD  ,  Endo/Other  diabetesMorbid obesity  Renal/GU Renal disease     Musculoskeletal  (+) Arthritis , Osteoarthritis,    Abdominal Normal abdominal exam  (+)   Peds  Hematology   Anesthesia Other Findings   Reproductive/Obstetrics                            Anesthesia Physical Anesthesia Plan  ASA: 4  Anesthesia Plan: MAC   Post-op Pain Management:    Induction: Intravenous  PONV Risk Score and Plan: 1 and Propofol infusion and Treatment may vary due to age or medical condition  Airway Management Planned: Natural Airway and Nasal  Cannula  Additional Equipment:   Intra-op Plan:   Post-operative Plan:   Informed Consent: I have reviewed the patients History and Physical, chart, labs and discussed the procedure including the risks, benefits and alternatives for the proposed anesthesia with the patient or authorized representative who has indicated his/her understanding and acceptance.     Dental advisory given  Plan Discussed with: Anesthesiologist and CRNA  Anesthesia Plan Comments: (Phenylephrine gtt available for h/o HOCM. Propofol gtt. MAC. Norton Blizzard, MD  )       Anesthesia Quick Evaluation

## 2020-09-13 NOTE — Interval H&P Note (Signed)
History and Physical Interval Note:  09/13/2020 9:12 AM  Alexandra Bell  has presented today for surgery, with the diagnosis of GI bleed, anemia.  The various methods of treatment have been discussed with the patient and family. After consideration of risks, benefits and other options for treatment, the patient has consented to  Procedure(s): ESOPHAGOGASTRODUODENOSCOPY (EGD) WITH PROPOFOL (N/A) COLONOSCOPY WITH PROPOFOL (N/A) as a surgical intervention.  The patient's history has been reviewed, patient examined, no change in status, stable for surgery.  I have reviewed the patient's chart and labs.  Questions were answered to the patient's satisfaction.     Jackquline Denmark

## 2020-09-13 NOTE — Progress Notes (Signed)
Patient was off oxygen for over an hour while in bed and satted at 87  Put back on 2l sattubg at 94 in 2 minutes

## 2020-09-13 NOTE — Progress Notes (Signed)
Pt with low blood sugar at start of night, 2133 , FSBS 62. Pt on clear liquid for colonoscopy tomorrow..has completed part on of 2 part bowel prep , Movi. Pt given 1 container apple juice , followed by 1 container Boost Breeze which has 250 cal and 9 gram protein in a clear liquid. Pt rechecked to be fsbs 129 at 2220 , remained with stable blood through this am's check 95 at 0637. Pt denies complains or symptoms of low blood sugar. Pt completed part 2 of Movi bowl prep early this am. Continues to deny complaints or symptoms of low blood sugar.

## 2020-09-14 ENCOUNTER — Encounter (HOSPITAL_COMMUNITY): Payer: Self-pay | Admitting: Gastroenterology

## 2020-09-14 LAB — BASIC METABOLIC PANEL
Anion gap: 6 (ref 5–15)
BUN: 8 mg/dL (ref 6–20)
CO2: 25 mmol/L (ref 22–32)
Calcium: 9.8 mg/dL (ref 8.9–10.3)
Chloride: 109 mmol/L (ref 98–111)
Creatinine, Ser: 0.67 mg/dL (ref 0.44–1.00)
GFR, Estimated: >60 mL/min (ref >60)
Glucose, Bld: 144 mg/dL — ABNORMAL HIGH (ref 70–99)
Potassium: 4.5 mmol/L (ref 3.5–5.1)
Sodium: 140 mmol/L (ref 135–145)

## 2020-09-14 LAB — CBC
HCT: 27 % — ABNORMAL LOW (ref 36.0–46.0)
Hemoglobin: 8 g/dL — ABNORMAL LOW (ref 12.0–15.0)
MCH: 25.6 pg — ABNORMAL LOW (ref 26.0–34.0)
MCHC: 29.6 g/dL — ABNORMAL LOW (ref 30.0–36.0)
MCV: 86.5 fL (ref 80.0–100.0)
Platelets: 219 10*3/uL (ref 150–400)
RBC: 3.12 MIL/uL — ABNORMAL LOW (ref 3.87–5.11)
RDW: 16.1 % — ABNORMAL HIGH (ref 11.5–15.5)
WBC: 9.7 10*3/uL (ref 4.0–10.5)
nRBC: 0 % (ref 0.0–0.2)

## 2020-09-14 LAB — GLUCOSE, CAPILLARY
Glucose-Capillary: 124 mg/dL — ABNORMAL HIGH (ref 70–99)
Glucose-Capillary: 100 mg/dL — ABNORMAL HIGH (ref 70–99)

## 2020-09-14 MED ORDER — PANTOPRAZOLE SODIUM 40 MG PO TBEC: 40.0000 mg | DELAYED_RELEASE_TABLET | Freq: Two times a day (BID) | ORAL | 1 refills | Status: DC

## 2020-09-14 MED ORDER — APIXABAN 5 MG PO TABS
5.0000 mg | ORAL_TABLET | Freq: Two times a day (BID) | ORAL | Status: DC
  Administered 2020-09-14: 5 mg via ORAL
  Filled 2020-09-14: qty 1

## 2020-09-14 MED ORDER — PANTOPRAZOLE SODIUM 40 MG PO TBEC
40.0000 mg | DELAYED_RELEASE_TABLET | Freq: Two times a day (BID) | ORAL | Status: DC
  Administered 2020-09-14: 40 mg via ORAL
  Filled 2020-09-14: qty 1

## 2020-09-14 NOTE — Evaluation (Signed)
Physical Therapy Evaluation Patient Details Name: Alexandra Bell MRN: [id number] DOB: [birth year] Today's Date: 09/14/2020   History of Present Illness  55 y.o. female presents to [facility] ED on 09/11/2020 with symptomatic anemia, Hgb initially 4.9. Pt underwent upper GI endoscopy and colonoscopy on 09/13/2020. PMH includes hypertrophic cardiomyopathy status post ICD, essential hypertension, type 2 diabetes mellitus, chronic diastolic dysfunction heart failure, paroxysmal atrial fibrillation on Eliquis, tobacco use disorder, morbid obesity.  Clinical Impression  Pt presents to PT with deficits in gait, balance, sensation, power, endurance. Pt demonstrates increased sway and impaired balance when ambulating, benefiting from PT assistance to maintain safety at this time. PT encourages pt to ambulate multiple times daily with use of walker to improve LE strength and activity tolerance, pt in agreement. Pt will benefit from continued acute PT services to progress activity tolerance and to aide in restoring the pt's prior level of function.    Follow Up Recommendations No PT follow up    Equipment Recommendations  None recommended by PT (pt owns necessary DME)    Recommendations for Other Services       Precautions / Restrictions Precautions Precautions: Fall Restrictions Weight Bearing Restrictions: No      Mobility  Bed Mobility Overal bed mobility: Independent                  Transfers Overall transfer level: Needs assistance Equipment used: None Transfers: Sit to/from Stand Sit to Stand: Supervision            Ambulation/Gait Ambulation/Gait assistance: Min guard Gait Distance (Feet): 120 Feet Assistive device: None Gait Pattern/deviations: Step-through pattern;Drifts right/left Gait velocity: reduced Gait velocity interpretation: 1.31 - 2.62 ft/sec, indicative of limited community ambulator General Gait Details: pt with increased lateral sway, out-toeing of LLE with  reduced foot clearance  Stairs            Wheelchair Mobility    Modified Rankin (Stroke Patients Only)       Balance Overall balance assessment: Needs assistance Sitting-balance support: No upper extremity supported;Feet supported Sitting balance-Leahy Scale: Good     Standing balance support: No upper extremity supported Standing balance-Leahy Scale: Fair                               Pertinent Vitals/Pain Pain Assessment: No/denies pain    Home Living Family/patient expects to be discharged to:: Private residence Living Arrangements: Other relatives (sister) Available Help at Discharge: Family;Available PRN/intermittently Type of Home: House Home Access: Level entry     Home Layout: One level Home Equipment: Walker - 2 wheels;Cane - quad (small based quad cane)      Prior Function Level of Independence: Independent with assistive device(s)         Comments: pt reports PRN use of small based quad cane in the community     Hand Dominance        Extremity/Trunk Assessment   Upper Extremity Assessment Upper Extremity Assessment: Overall WFL for tasks assessed    Lower Extremity Assessment Lower Extremity Assessment: RLE deficits/detail RLE Sensation: decreased light touch (numbness/tingling of RLE, pt reports this is a subacute on chronic issue)    Cervical / Trunk Assessment Cervical / Trunk Assessment: Other exceptions Cervical / Trunk Exceptions: morbid obesity  Communication   Communication: No difficulties  Cognition Arousal/Alertness: Awake/alert Behavior During Therapy: WFL for tasks assessed/performed Overall Cognitive Status: Within Functional Limits for tasks assessed  General Comments General comments (skin integrity, edema, etc.): pt tachy up to 120 with mobility, reports dizziness during ambulation although BP of 163/61 upon completion of ambulation and SpO2  stable on room air with activity    Exercises     Assessment/Plan    PT Assessment Patient needs continued PT services  PT Problem List Decreased strength;Decreased activity tolerance;Decreased balance;Decreased mobility       PT Treatment Interventions DME instruction;Gait training;Functional mobility training;Therapeutic activities;Therapeutic exercise;Balance training;Neuromuscular re-education;Patient/family education    PT Goals (Current goals can be found in the Care Plan section)  Acute Rehab PT Goals Patient Stated Goal: to go home PT Goal Formulation: With patient Time For Goal Achievement: 09/28/20 Potential to Achieve Goals: Good Additional Goals Additional Goal #1: Pt will score >19/24 on DGI to indicate a reduced risk for falls    Frequency Min 3X/week   Barriers to discharge        Co-evaluation               AM-PAC PT "6 Clicks" Mobility  Outcome Measure Help needed turning from your back to your side while in a flat bed without using bedrails?: None Help needed moving from lying on your back to sitting on the side of a flat bed without using bedrails?: None Help needed moving to and from a bed to a chair (including a wheelchair)?: A Little Help needed standing up from a chair using your arms (e.g., wheelchair or bedside chair)?: A Little Help needed to walk in hospital room?: A Little Help needed climbing 3-5 steps with a railing? : A Little 6 Click Score: 20    End of Session   Activity Tolerance: Patient tolerated treatment well Patient left: in chair;with call bell/phone within reach Nurse Communication: Mobility status PT Visit Diagnosis: Unsteadiness on feet (R26.81);Other abnormalities of gait and mobility (R26.89)    Time: [id number] PT Time Calculation (min) (ACUTE ONLY): 17 min   Charges:   PT Evaluation $PT Eval Low Complexity: 1 Low          Zenaida Niece, PT, DPT Acute Rehabilitation Pager: [phone]   Zenaida Niece 09/14/2020, 9:00 AM

## 2020-09-14 NOTE — Discharge Summary (Signed)
Physician Discharge Summary  Alexandra Bell [id number] DOB: 1965-10-04 DOA: 09/11/2020  PCP: [name], FNP  Admit date: 09/11/2020 Discharge date: 09/14/2020  Admitted From: [facility] Disposition:  home  Recommendations for Outpatient Follow-up:  Follow up with PCP in 1-2 weeks Please obtain BMP/CBC in one week Please follow up on the following pending results:  Home Health:no  Equipment/Devices: none  Discharge Condition: Stable Code Status:   Code Status: Full Code Diet recommendation:  Diet Order             Diet Heart Room service appropriate? Yes; Fluid consistency: Thin  Diet effective now                    Brief/Interim Summary:  55 year old African-American female with past medical history significant for hypertrophic cardiomyopathy status post ICD, essential hypertension, type 2 diabetes mellitus, chronic diastolic dysfunction heart failure, paroxysmal atrial fibrillation on Eliquis, tobacco use disorder, morbid obesity, she was admitted for symptomatic anemia hemoglobin initially was 4.9 on presentation and it was around 12.6 about 5 months ago.  Patient was on Eliquis diclofenac aspirin prior to admission she was transfused with 2 units packed RBC hemoglobin came up to 6.7 she was transfused with additional 1 unit GI was consulted and they proceeded to do her EGD/colonoscopy that showed nonbleeding duodenal ulcers, mild gastritis, nonbleeding internal hemorrhoids.  Hemoglobin remained stable Dr. Lyndel Safe advised to resume Eliquis and okay to discharge 9/7.  I discussed with GI this morning.  Discharge Diagnoses:   Acute blood loss anemia Upper GI bleeding Symptomatic anemia EGD/colonoscopy that showed nonbleeding duodenal ulcers, mild gastritis, nonbleeding internal hemorrhoids.  Hemoglobin remained stable Dr. Lyndel Safe advised to resume Eliquis and okay to discharge 9/7, patient advised to hold aspirin AND FU WITH HER DOCTOR. CHECK 1BC IN 1 WK.  Status post 1 unit  PRBC. Recent Labs  Lab 09/11/20 2102 09/12/20 0912 09/12/20 1500 09/12/20 1835 09/14/20 0828  HGB 5.2* 6.7* 7.4* 7.6* 8.0*  HCT 18.6* 22.0* 24.9* 25.5* 27.0*    Acute hypoxic respiratory failure: in the setting of anemia-resolved.  On room air. PAF Essential hypertension: Rate controlled.  BP stable patient will resume Coreg, Eliquis.  HOCM-outpatient follow-up  Chronic diastolic CHF-compensated continue home meds Type 2 diabetes resume metformin and home meds Peripheral neuropathy continue Neurontin Tobacco abuse cessation was advised Morbid obesity BMI 44.5 will benefit weight loss, PCP follow-up and healthy lifestyle.  Consults: GI  Subjective: Alert awake oriented this morning.  Ambulatory.  No new complaints.  Ready for home. Discharge Exam: Vitals:   09/14/20 0433 09/14/20 1009  BP: (!) 133/53 (!) 150/66  Pulse: 76 74  Resp: 16 18  Temp: 98.7 F (37.1 C) 98.6 F (37 C)  SpO2: 100% 99%   General: Pt is alert, awake, not in acute distress Cardiovascular: RRR, S1/S2 +, no rubs, no gallops Respiratory: CTA bilaterally, no wheezing, no rhonchi Abdominal: Soft, NT, ND, bowel sounds + Extremities: no edema, no cyanosis  Discharge Instructions  Discharge Instructions     Discharge instructions   Complete by: As directed    Cbc check I n1 wk  Please call call MD or return to ER for similar or worsening recurring problem that brought you to hospital or if any fever,nausea/vomiting,abdominal pain, uncontrolled pain, chest pain,  shortness of breath or any other alarming symptoms.  Please follow-up your doctor as instructed in a week time and call the office for appointment.  Please avoid alcohol, smoking, or any other illicit  substance and maintain healthy habits including taking your regular medications as prescribed.  You were cared for by a hospitalist during your hospital stay. If you have any questions about your discharge medications or the care you  received while you were in the hospital after you are discharged, you can call the unit and ask to speak with the hospitalist on call if the hospitalist that took care of you is not available.  Once you are discharged, your primary care physician will handle any further medical issues. Please note that NO REFILLS for any discharge medications will be authorized once you are discharged, as it is imperative that you return to your primary care physician (or establish a relationship with a primary care physician if you do not have one) for your aftercare needs so that they can reassess your need for medications and monitor your lab values   Increase activity slowly   Complete by: As directed       Allergies as of 09/14/2020   No Known Allergies      Medication List     STOP taking these medications    aspirin EC 81 MG tablet       TAKE these medications    acetaminophen 500 MG tablet Commonly known as: TYLENOL Take 1,500 mg by mouth every 6 (six) hours as needed (For pain).   albuterol 108 (90 Base) MCG/ACT inhaler Commonly known as: VENTOLIN HFA Inhale 2 puffs into the lungs every 6 (six) hours as needed. For asthma   amLODipine 10 MG tablet Commonly known as: NORVASC Take 1 tablet (10 mg total) by mouth daily.   carvedilol 25 MG tablet Commonly known as: COREG Take 1 tablet (25 mg total) by mouth 2 (two) times daily.   dapagliflozin propanediol 10 MG Tabs tablet Commonly known as: Farxiga Take 1 tablet (10 mg total) by mouth daily before breakfast.   diclofenac Sodium 1 % Gel Commonly known as: VOLTAREN APPLY 2 G TOPICALLY FOUR TIMES DAILY.   Diclofenac Sodium CR 100 MG 24 hr tablet Take 1 tablet (100 mg total) by mouth every morning.   Eliquis 5 MG Tabs tablet Generic drug: apixaban Take 1 tablet by mouth twice daily   furosemide 80 MG tablet Commonly known as: Lasix Take 1 tablet (80 mg total) by mouth daily.   gabapentin 300 MG capsule Commonly known as:  NEURONTIN TAKE 1 CAPSULE BY MOUTH THREE TIMES DAILY   lisinopril 40 MG tablet Commonly known as: ZESTRIL Take 1 tablet (40 mg total) by mouth every morning.   Magnesium 250 MG Tabs Take 250 mg by mouth daily at 2 PM.   melatonin 5 MG Tabs Take 5 mg by mouth daily at 2 PM.   metFORMIN 500 MG tablet Commonly known as: GLUCOPHAGE TAKE 1 TABLET BY MOUTH TWICE DAILY WITH A MEAL   multivitamin with minerals Tabs tablet Take 1 tablet by mouth in the morning. One-A-Day Women's Multivitamin   pantoprazole 40 MG tablet Commonly known as: PROTONIX Take 1 tablet (40 mg total) by mouth 2 (two) times daily.        Follow-up Information     [name], FNP Follow up in 1 week(s).   Specialty: General Practice Contact information: [address] [place] [address] [phone]         Skeet Latch, MD .   Specialty: Cardiology Contact information: [address] [place] [address] [phone]         Lars Mage  T, MD .   Specialties: Cardiology, Radiology Contact information: [place] [address] [phone]         Jackquline Denmark, MD Follow up in 2 week(s).   Specialties: Gastroenterology, Internal Medicine Contact information: [place] [address] [phone]                No Known Allergies  The results of significant diagnostics from this hospitalization (including imaging, microbiology, ancillary and laboratory) are listed below for reference.    Microbiology: Recent Results (from the past 240 hour(s))  Resp Panel by RT-PCR (Flu A&B, Covid) Nasopharyngeal Swab     Status: None   Collection Time: 09/11/20  5:51 PM   Specimen: Nasopharyngeal Swab; Nasopharyngeal(NP) swabs in vial transport medium  Result Value Ref Range Status   SARS Coronavirus 2 by RT PCR NEGATIVE NEGATIVE Final    Comment: (NOTE) SARS-CoV-2 target nucleic acids  are NOT DETECTED.  The SARS-CoV-2 RNA is generally detectable in upper respiratory specimens during the acute phase of infection. The lowest concentration of SARS-CoV-2 viral copies this assay can detect is 138 copies/mL. A negative result does not preclude SARS-Cov-2 infection and should not be used as the sole basis for treatment or other patient management decisions. A negative result may occur with  improper specimen collection/handling, submission of specimen other than nasopharyngeal swab, presence of viral mutation(s) within the areas targeted by this assay, and inadequate number of viral copies(<138 copies/mL). A negative result must be combined with clinical observations, patient history, and epidemiological information. The expected result is Negative.  Fact Sheet for Patients:  EntrepreneurPulse.com.au  Fact Sheet for Healthcare Providers:  IncredibleEmployment.be  This test is no t yet approved or cleared by the [place] FDA and  has been authorized for detection and/or diagnosis of SARS-CoV-2 by FDA under an Emergency Use Authorization (EUA). This EUA will remain  in effect (meaning this test can be used) for the duration of the COVID-19 declaration under Section 564(b)(1) of the Act, 21 U.S.C.section 360bbb-3(b)(1), unless the authorization is terminated  or revoked sooner.       Influenza A by PCR NEGATIVE NEGATIVE Final   Influenza B by PCR NEGATIVE NEGATIVE Final    Comment: (NOTE) The Xpert Xpress SARS-CoV-2/FLU/RSV plus assay is intended as an aid in the diagnosis of influenza from Nasopharyngeal swab specimens and should not be used as a sole basis for treatment. Nasal washings and aspirates are unacceptable for Xpert Xpress SARS-CoV-2/FLU/RSV testing.  Fact Sheet for Patients: EntrepreneurPulse.com.au  Fact Sheet for Healthcare Providers: IncredibleEmployment.be  This test is  not yet approved or cleared by the [place] FDA and has been authorized for detection and/or diagnosis of SARS-CoV-2 by FDA under an Emergency Use Authorization (EUA). This EUA will remain in effect (meaning this test can be used) for the duration of the COVID-19 declaration under Section 564(b)(1) of the Act, 21 U.S.C. section 360bbb-3(b)(1), unless the authorization is terminated or revoked.  Performed at [facility], [place] [address] [place], [place] [address]     Procedures/Studies: CT Head Wo Contrast  Result Date: 09/11/2020 CLINICAL DATA:  Mental status change EXAM: CT HEAD WITHOUT CONTRAST TECHNIQUE: Contiguous axial images were obtained from the base of the skull through the vertex without intravenous contrast. COMPARISON:  None. FINDINGS: Brain: No evidence of acute infarction, hemorrhage, hydrocephalus, extra-axial collection or mass lesion/mass effect. Vascular: Atherosclerotic and physiologic intracranial calcifications. Skull: Normal. Negative for  fracture or focal lesion. Sinuses/Orbits: No acute finding. Other: Cerumen partially occludes the right external auditory canal IMPRESSION: No acute findings Electronically Signed   By: Lucrezia Europe M.D.   On: 09/11/2020 15:54   DG Chest Port 1 View  Result Date: 09/11/2020 CLINICAL DATA:  Weakness, lethargy EXAM: PORTABLE CHEST 1 VIEW COMPARISON:  Radiograph 03/25/2020, chest CT 07/14/2009 FINDINGS: Unchanged, enlarged cardiac silhouette with dual chamber pacemaker leads. There are bibasilar opacities, which are persistent on multiple prior exams including chest CT in 2011. No new airspace disease. No acute osseous abnormality. IMPRESSION: Unchanged, enlarged cardiac silhouette. Persistent bibasilar opacities, seen on multiple prior exams dating back to 2011, which could represent chronic interstitial lung disease. No new airspace disease. Electronically Signed   By: Maurine Simmering M.D.   On: 09/11/2020 15:28   VAS [place] LOWER EXTREMITY  VENOUS (DVT)  Result Date: 09/12/2020  Lower Venous DVT Study Patient Name:  Alexandra Bell  Date of Exam:   09/12/2020 Medical Rec #: [id number]         Accession #:    [id number] Date of Birth: Jul 03, 1965         Patient Gender: F Patient Age:   30 years Exam Location:  [facility] Procedure:      VAS [place] LOWER EXTREMITY VENOUS (DVT) Referring Phys: ERIC [place] --------------------------------------------------------------------------------  Indications: Edema.  Limitations: Poor ultrasound/tissue interface and body habitus. Comparison Study: no prior Performing Technologist: Archie Patten RVS  Examination Guidelines: A complete evaluation includes B-mode imaging, spectral Doppler, color Doppler, and power Doppler as needed of all accessible portions of each vessel. Bilateral testing is considered an integral part of a complete examination. Limited examinations for reoccurring indications may be performed as noted. The reflux portion of the exam is performed with the patient in reverse Trendelenburg.  +---------+---------------+---------+-----------+----------+--------------+ RIGHT    CompressibilityPhasicitySpontaneityPropertiesThrombus Aging +---------+---------------+---------+-----------+----------+--------------+ CFV      Full           Yes      Yes                                 +---------+---------------+---------+-----------+----------+--------------+ SFJ      Full                                                        +---------+---------------+---------+-----------+----------+--------------+ FV Prox  Full                                                        +---------+---------------+---------+-----------+----------+--------------+ FV Mid   Full                                                        +---------+---------------+---------+-----------+----------+--------------+ FV Distal               Yes      Yes                                  +---------+---------------+---------+-----------+----------+--------------+  PFV      Full                                                        +---------+---------------+---------+-----------+----------+--------------+ POP      Full           Yes      Yes                                 +---------+---------------+---------+-----------+----------+--------------+ PTV      Full                                                        +---------+---------------+---------+-----------+----------+--------------+ PERO     Full                                                        +---------+---------------+---------+-----------+----------+--------------+   +---------+---------------+---------+-----------+----------+--------------+ LEFT     CompressibilityPhasicitySpontaneityPropertiesThrombus Aging +---------+---------------+---------+-----------+----------+--------------+ CFV      Full           Yes      Yes                                 +---------+---------------+---------+-----------+----------+--------------+ SFJ      Full                                                        +---------+---------------+---------+-----------+----------+--------------+ FV Prox  Full                                                        +---------+---------------+---------+-----------+----------+--------------+ FV Mid   Full                                                        +---------+---------------+---------+-----------+----------+--------------+ FV DistalFull                                                        +---------+---------------+---------+-----------+----------+--------------+ PFV      Full                                                        +---------+---------------+---------+-----------+----------+--------------+  POP      Full           Yes      Yes                                  +---------+---------------+---------+-----------+----------+--------------+ PTV      Full                                                        +---------+---------------+---------+-----------+----------+--------------+ PERO     Full                                                        +---------+---------------+---------+-----------+----------+--------------+     Summary: BILATERAL: - No evidence of deep vein thrombosis seen in the lower extremities, bilaterally. -No evidence of popliteal cyst, bilaterally.   *See table(s) above for measurements and observations. Electronically signed by Deitra Mayo MD on 09/12/2020 at 3:18:01 PM.    Final     Labs: BNP (last 3 results) Recent Labs    10/22/19 1049 01/28/20 1116 09/11/20 1427  BNP 541.2* 893.9* [id number]*   Basic Metabolic Panel: Recent Labs  Lab 09/11/20 1427 09/11/20 2102 09/14/20 0828  NA 138 139 140  K 4.5 4.6 4.5  CL 105 109 109  CO2 '25 26 25  '$ GLUCOSE 105* 93 144*  BUN 24* 24* 8  CREATININE 1.00 0.95 0.67  CALCIUM 9.8 9.5 9.8   Liver Function Tests: Recent Labs  Lab 09/11/20 1534  AST 17  ALT 13  ALKPHOS 44  BILITOT 0.5  PROT 5.5*  ALBUMIN 2.9*   No results for input(s): LIPASE, AMYLASE in the last 168 hours. Recent Labs  Lab 09/11/20 1533  AMMONIA 13   CBC: Recent Labs  Lab 09/11/20 1427 09/11/20 2102 09/12/20 0912 09/12/20 1500 09/12/20 1835 09/14/20 0828  WBC 10.5 9.1  --   --   --  9.7  NEUTROABS 8.0*  --   --   --   --   --   HGB 4.5* 5.2* 6.7* 7.4* 7.6* 8.0*  HCT 16.7* 18.6* 22.0* 24.9* 25.5* 27.0*  MCV 86.5 86.5  --   --   --  86.5  PLT 242 225  --   --   --  219   Cardiac Enzymes: No results for input(s): CKTOTAL, CKMB, CKMBINDEX, TROPONINI in the last 168 hours. BNP: Invalid input(s): POCBNP CBG: Recent Labs  Lab 09/13/20 1149 09/13/20 1655 09/13/20 2047 09/14/20 0705 09/14/20 1102  GLUCAP 71 103* 115* 124* 100*   D-Dimer No results for input(s): DDIMER in  the last 72 hours. Hgb A1c Recent Labs    09/11/20 2102  HGBA1C 4.7*   Lipid Profile No results for input(s): CHOL, HDL, LDLCALC, TRIG, CHOLHDL, LDLDIRECT in the last 72 hours. Thyroid function studies Recent Labs    09/11/20 1534  TSH 1.842   Anemia work up Recent Labs    09/11/20 1534 09/11/20 2102  VITAMINB12 914  --   FOLATE 78.9  --   FERRITIN 6*  --   TIBC 515*  --   IRON 21*  --  RETICCTPCT  --  3.3*   Urinalysis    Component Value Date/Time   COLORURINE YELLOW 11/28/2010 1033   APPEARANCEUR HAZY (A) 11/28/2010 1033   LABSPEC 1.021 11/28/2010 1033   PHURINE 5.5 11/28/2010 1033   GLUCOSEU NEGATIVE 11/28/2010 1033   HGBUR MODERATE (A) 11/28/2010 1033   BILIRUBINUR negative 05/07/2019 1645   KETONESUR NEGATIVE 11/28/2010 1033   PROTEINUR Negative 05/07/2019 1645   PROTEINUR 30 (A) 11/28/2010 1033   UROBILINOGEN 0.2 05/07/2019 1645   UROBILINOGEN 0.2 11/28/2010 1033   NITRITE negative 05/07/2019 1645   NITRITE NEGATIVE 11/28/2010 1033   LEUKOCYTESUR Negative 05/07/2019 1645   Sepsis Labs Invalid input(s): PROCALCITONIN,  WBC,  LACTICIDVEN Microbiology Recent Results (from the past 240 hour(s))  Resp Panel by RT-PCR (Flu A&B, Covid) Nasopharyngeal Swab     Status: None   Collection Time: 09/11/20  5:51 PM   Specimen: Nasopharyngeal Swab; Nasopharyngeal(NP) swabs in vial transport medium  Result Value Ref Range Status   SARS Coronavirus 2 by RT PCR NEGATIVE NEGATIVE Final    Comment: (NOTE) SARS-CoV-2 target nucleic acids are NOT DETECTED.  The SARS-CoV-2 RNA is generally detectable in upper respiratory specimens during the acute phase of infection. The lowest concentration of SARS-CoV-2 viral copies this assay can detect is 138 copies/mL. A negative result does not preclude SARS-Cov-2 infection and should not be used as the sole basis for treatment or other patient management decisions. A negative result may occur with  improper specimen  collection/handling, submission of specimen other than nasopharyngeal swab, presence of viral mutation(s) within the areas targeted by this assay, and inadequate number of viral copies(<138 copies/mL). A negative result must be combined with clinical observations, patient history, and epidemiological information. The expected result is Negative.  Fact Sheet for Patients:  EntrepreneurPulse.com.au  Fact Sheet for Healthcare Providers:  IncredibleEmployment.be  This test is no t yet approved or cleared by the [place] FDA and  has been authorized for detection and/or diagnosis of SARS-CoV-2 by FDA under an Emergency Use Authorization (EUA). This EUA will remain  in effect (meaning this test can be used) for the duration of the COVID-19 declaration under Section 564(b)(1) of the Act, 21 U.S.C.section 360bbb-3(b)(1), unless the authorization is terminated  or revoked sooner.       Influenza A by PCR NEGATIVE NEGATIVE Final   Influenza B by PCR NEGATIVE NEGATIVE Final    Comment: (NOTE) The Xpert Xpress SARS-CoV-2/FLU/RSV plus assay is intended as an aid in the diagnosis of influenza from Nasopharyngeal swab specimens and should not be used as a sole basis for treatment. Nasal washings and aspirates are unacceptable for Xpert Xpress SARS-CoV-2/FLU/RSV testing.  Fact Sheet for Patients: EntrepreneurPulse.com.au  Fact Sheet for Healthcare Providers: IncredibleEmployment.be  This test is not yet approved or cleared by the [place] FDA and has been authorized for detection and/or diagnosis of SARS-CoV-2 by FDA under an Emergency Use Authorization (EUA). This EUA will remain in effect (meaning this test can be used) for the duration of the COVID-19 declaration under Section 564(b)(1) of the Act, 21 U.S.C. section 360bbb-3(b)(1), unless the authorization is terminated or revoked.  Performed at [facility], [place] [address], [place], [place] [address]      Time coordinating discharge: 35 minutes  SIGNED: Antonieta Pert, MD  [facility] 09/14/2020, 1:01 PM  If 7PM-7AM, please contact night-coverage [email or web address]

## 2020-09-15 ENCOUNTER — Other Ambulatory Visit: Payer: Self-pay | Admitting: Nurse Practitioner

## 2020-09-15 ENCOUNTER — Telehealth: Payer: Self-pay

## 2020-09-15 DIAGNOSIS — R7309 Other abnormal glucose: Secondary | ICD-10-CM

## 2020-09-15 NOTE — Telephone Encounter (Signed)
Transition Care Management Unsuccessful Follow-up Telephone Call  Date of discharge and from where:  09/14/2020 Alexandra Bell  Attempts:  1st Attempt  Reason for unsuccessful TCM follow-up call:  Left voice message

## 2020-09-22 ENCOUNTER — Telehealth: Payer: Self-pay

## 2020-09-22 NOTE — Telephone Encounter (Signed)
Lvm.  Returned call to make appt

## 2020-09-29 ENCOUNTER — Ambulatory Visit (INDEPENDENT_AMBULATORY_CARE_PROVIDER_SITE_OTHER): Payer: 59

## 2020-09-29 DIAGNOSIS — I421 Obstructive hypertrophic cardiomyopathy: Secondary | ICD-10-CM | POA: Diagnosis not present

## 2020-09-30 LAB — CUP PACEART REMOTE DEVICE CHECK
Battery Remaining Longevity: 156 mo
Battery Remaining Percentage: 100 %
Brady Statistic RA Percent Paced: 14 %
Brady Statistic RV Percent Paced: 0 %
Date Time Interrogation Session: 20220921005200
HighPow Impedance: 58 Ohm
Implantable Lead Implant Date: 20220318
Implantable Lead Implant Date: 20220318
Implantable Lead Location: 753859
Implantable Lead Location: 753860
Implantable Lead Model: 673
Implantable Lead Model: 7841
Implantable Lead Serial Number: 1122351
Implantable Lead Serial Number: 158410
Implantable Pulse Generator Implant Date: 20220318
Lead Channel Impedance Value: 373 Ohm
Lead Channel Impedance Value: 505 Ohm
Lead Channel Setting Pacing Amplitude: 2 V
Lead Channel Setting Pacing Amplitude: 2.5 V
Lead Channel Setting Pacing Pulse Width: 0.4 ms
Lead Channel Setting Sensing Sensitivity: 0.5 mV
Pulse Gen Serial Number: 593724

## 2020-10-04 ENCOUNTER — Emergency Department (HOSPITAL_COMMUNITY): Payer: 59

## 2020-10-04 ENCOUNTER — Other Ambulatory Visit: Payer: Self-pay

## 2020-10-04 ENCOUNTER — Encounter (HOSPITAL_COMMUNITY): Payer: Self-pay

## 2020-10-04 ENCOUNTER — Inpatient Hospital Stay (HOSPITAL_COMMUNITY)
Admission: EM | Admit: 2020-10-04 | Discharge: 2020-10-07 | DRG: 378 | Disposition: A | Discharge status: Home / Self Care | Payer: 59 | Attending: Internal Medicine | Admitting: Internal Medicine

## 2020-10-04 ENCOUNTER — Observation Stay (HOSPITAL_COMMUNITY): Payer: 59

## 2020-10-04 DIAGNOSIS — J45909 Unspecified asthma, uncomplicated: Secondary | ICD-10-CM | POA: Diagnosis present

## 2020-10-04 DIAGNOSIS — Z7901 Long term (current) use of anticoagulants: Secondary | ICD-10-CM | POA: Diagnosis not present

## 2020-10-04 DIAGNOSIS — G8929 Other chronic pain: Secondary | ICD-10-CM

## 2020-10-04 DIAGNOSIS — M79641 Pain in right hand: Secondary | ICD-10-CM

## 2020-10-04 DIAGNOSIS — K922 Gastrointestinal hemorrhage, unspecified: Secondary | ICD-10-CM | POA: Diagnosis present

## 2020-10-04 DIAGNOSIS — F17200 Nicotine dependence, unspecified, uncomplicated: Secondary | ICD-10-CM

## 2020-10-04 DIAGNOSIS — E119 Type 2 diabetes mellitus without complications: Secondary | ICD-10-CM

## 2020-10-04 DIAGNOSIS — I5032 Chronic diastolic (congestive) heart failure: Secondary | ICD-10-CM | POA: Diagnosis present

## 2020-10-04 DIAGNOSIS — K2970 Gastritis, unspecified, without bleeding: Secondary | ICD-10-CM

## 2020-10-04 DIAGNOSIS — I110 Hypertensive heart disease with heart failure: Secondary | ICD-10-CM | POA: Diagnosis present

## 2020-10-04 DIAGNOSIS — Z95810 Presence of automatic (implantable) cardiac defibrillator: Secondary | ICD-10-CM

## 2020-10-04 DIAGNOSIS — N179 Acute kidney failure, unspecified: Secondary | ICD-10-CM | POA: Diagnosis present

## 2020-10-04 DIAGNOSIS — I472 Ventricular tachycardia: Secondary | ICD-10-CM | POA: Diagnosis present

## 2020-10-04 DIAGNOSIS — K2990 Gastroduodenitis, unspecified, without bleeding: Secondary | ICD-10-CM

## 2020-10-04 DIAGNOSIS — K219 Gastro-esophageal reflux disease without esophagitis: Secondary | ICD-10-CM | POA: Diagnosis present

## 2020-10-04 DIAGNOSIS — D649 Anemia, unspecified: Secondary | ICD-10-CM | POA: Diagnosis not present

## 2020-10-04 DIAGNOSIS — D62 Acute posthemorrhagic anemia: Secondary | ICD-10-CM

## 2020-10-04 DIAGNOSIS — K269 Duodenal ulcer, unspecified as acute or chronic, without hemorrhage or perforation: Secondary | ICD-10-CM | POA: Diagnosis not present

## 2020-10-04 DIAGNOSIS — K648 Other hemorrhoids: Secondary | ICD-10-CM | POA: Diagnosis present

## 2020-10-04 DIAGNOSIS — I480 Paroxysmal atrial fibrillation: Secondary | ICD-10-CM | POA: Diagnosis not present

## 2020-10-04 DIAGNOSIS — K264 Chronic or unspecified duodenal ulcer with hemorrhage: Secondary | ICD-10-CM | POA: Diagnosis present

## 2020-10-04 DIAGNOSIS — M170 Bilateral primary osteoarthritis of knee: Secondary | ICD-10-CM | POA: Diagnosis present

## 2020-10-04 DIAGNOSIS — Z8249 Family history of ischemic heart disease and other diseases of the circulatory system: Secondary | ICD-10-CM

## 2020-10-04 DIAGNOSIS — Z20822 Contact with and (suspected) exposure to covid-19: Secondary | ICD-10-CM | POA: Diagnosis present

## 2020-10-04 DIAGNOSIS — E1140 Type 2 diabetes mellitus with diabetic neuropathy, unspecified: Secondary | ICD-10-CM | POA: Diagnosis present

## 2020-10-04 DIAGNOSIS — Z833 Family history of diabetes mellitus: Secondary | ICD-10-CM

## 2020-10-04 DIAGNOSIS — Z7984 Long term (current) use of oral hypoglycemic drugs: Secondary | ICD-10-CM

## 2020-10-04 DIAGNOSIS — I421 Obstructive hypertrophic cardiomyopathy: Secondary | ICD-10-CM | POA: Diagnosis present

## 2020-10-04 DIAGNOSIS — E1142 Type 2 diabetes mellitus with diabetic polyneuropathy: Secondary | ICD-10-CM | POA: Diagnosis not present

## 2020-10-04 DIAGNOSIS — Z6841 Body Mass Index (BMI) 40.0 and over, adult: Secondary | ICD-10-CM

## 2020-10-04 DIAGNOSIS — K2981 Duodenitis with bleeding: Secondary | ICD-10-CM | POA: Diagnosis present

## 2020-10-04 DIAGNOSIS — K2971 Gastritis, unspecified, with bleeding: Secondary | ICD-10-CM | POA: Diagnosis not present

## 2020-10-04 DIAGNOSIS — Z79899 Other long term (current) drug therapy: Secondary | ICD-10-CM

## 2020-10-04 DIAGNOSIS — D509 Iron deficiency anemia, unspecified: Secondary | ICD-10-CM | POA: Diagnosis present

## 2020-10-04 DIAGNOSIS — M545 Low back pain, unspecified: Secondary | ICD-10-CM

## 2020-10-04 DIAGNOSIS — I4729 Other ventricular tachycardia: Secondary | ICD-10-CM

## 2020-10-04 DIAGNOSIS — I10 Essential (primary) hypertension: Secondary | ICD-10-CM | POA: Diagnosis present

## 2020-10-04 DIAGNOSIS — F17210 Nicotine dependence, cigarettes, uncomplicated: Secondary | ICD-10-CM | POA: Diagnosis present

## 2020-10-04 DIAGNOSIS — J45901 Unspecified asthma with (acute) exacerbation: Secondary | ICD-10-CM | POA: Diagnosis present

## 2020-10-04 LAB — COMPREHENSIVE METABOLIC PANEL
ALT: 11 U/L (ref 0–44)
AST: 17 U/L (ref 15–41)
Albumin: 3 g/dL — ABNORMAL LOW (ref 3.5–5.0)
Alkaline Phosphatase: 45 U/L (ref 38–126)
Anion gap: 10 (ref 5–15)
BUN: 46 mg/dL — ABNORMAL HIGH (ref 6–20)
CO2: 21 mmol/L — ABNORMAL LOW (ref 22–32)
Calcium: 9.5 mg/dL (ref 8.9–10.3)
Chloride: 107 mmol/L (ref 98–111)
Creatinine, Ser: 1.62 mg/dL — ABNORMAL HIGH (ref 0.44–1.00)
GFR, Estimated: 37 mL/min — ABNORMAL LOW (ref >60)
Glucose, Bld: 123 mg/dL — ABNORMAL HIGH (ref 70–99)
Potassium: 4.3 mmol/L (ref 3.5–5.1)
Sodium: 138 mmol/L (ref 135–145)
Total Bilirubin: 0.5 mg/dL (ref 0.3–1.2)
Total Protein: 5.6 g/dL — ABNORMAL LOW (ref 6.5–8.1)

## 2020-10-04 LAB — CBC WITH DIFFERENTIAL/PLATELET
Abs Immature Granulocytes: 0.02 10*3/uL (ref 0.00–0.07)
Basophils Absolute: 0 10*3/uL (ref 0.0–0.1)
Basophils Relative: 0 %
Eosinophils Absolute: 0 10*3/uL (ref 0.0–0.5)
Eosinophils Relative: 0 %
HCT: 15.7 % — ABNORMAL LOW (ref 36.0–46.0)
Hemoglobin: 4.3 g/dL — CL (ref 12.0–15.0)
Immature Granulocytes: 0 %
Lymphocytes Relative: 14 %
Lymphs Abs: 1.4 10*3/uL (ref 0.7–4.0)
MCH: 23 pg — ABNORMAL LOW (ref 26.0–34.0)
MCHC: 27.4 g/dL — ABNORMAL LOW (ref 30.0–36.0)
MCV: 84 fL (ref 80.0–100.0)
Monocytes Absolute: 0.6 10*3/uL (ref 0.1–1.0)
Monocytes Relative: 6 %
Neutro Abs: 7.9 10*3/uL — ABNORMAL HIGH (ref 1.7–7.7)
Neutrophils Relative %: 80 %
Platelets: 277 10*3/uL (ref 150–400)
RBC: 1.87 MIL/uL — ABNORMAL LOW (ref 3.87–5.11)
RDW: 18.3 % — ABNORMAL HIGH (ref 11.5–15.5)
WBC: 10 10*3/uL (ref 4.0–10.5)
nRBC: 0.6 % — ABNORMAL HIGH (ref 0.0–0.2)

## 2020-10-04 LAB — TROPONIN I (HIGH SENSITIVITY)
Troponin I (High Sensitivity): 32 ng/L — ABNORMAL HIGH (ref <18)
Troponin I (High Sensitivity): 33 ng/L — ABNORMAL HIGH (ref <18)

## 2020-10-04 LAB — BRAIN NATRIURETIC PEPTIDE: B Natriuretic Peptide: 1046.8 pg/mL — ABNORMAL HIGH (ref 0.0–100.0)

## 2020-10-04 LAB — CBG MONITORING, ED
Glucose-Capillary: 93 mg/dL (ref 70–99)
Glucose-Capillary: 97 mg/dL (ref 70–99)

## 2020-10-04 LAB — PREPARE RBC (CROSSMATCH)

## 2020-10-04 LAB — HEMOGLOBIN AND HEMATOCRIT, BLOOD
HCT: 24.9 % — ABNORMAL LOW (ref 36.0–46.0)
Hemoglobin: 7.5 g/dL — ABNORMAL LOW (ref 12.0–15.0)

## 2020-10-04 LAB — RESP PANEL BY RT-PCR (FLU A&B, COVID) ARPGX2
Influenza A by PCR: NEGATIVE
Influenza B by PCR: NEGATIVE
SARS Coronavirus 2 by RT PCR: NEGATIVE

## 2020-10-04 LAB — POC OCCULT BLOOD, ED: Fecal Occult Bld: POSITIVE — AB

## 2020-10-04 LAB — PROTIME-INR
INR: 1.9 — ABNORMAL HIGH (ref 0.8–1.2)
Prothrombin Time: 21.9 seconds — ABNORMAL HIGH (ref 11.4–15.2)

## 2020-10-04 MED ORDER — SODIUM CHLORIDE 0.9 % IV SOLN
10.0000 mL/h | Freq: Once | INTRAVENOUS | Status: AC
  Administered 2020-10-04: 10 mL/h via INTRAVENOUS

## 2020-10-04 MED ORDER — TRAMADOL HCL 50 MG PO TABS: 50.0000 mg | ORAL_TABLET | Freq: Four times a day (QID) | ORAL | Status: DC | PRN

## 2020-10-04 MED ORDER — MELATONIN 5 MG PO TABS
5.0000 mg | ORAL_TABLET | Freq: Every day | ORAL | Status: DC
  Administered 2020-10-05 – 2020-10-06 (×3): 5 mg via ORAL
  Filled 2020-10-04 (×3): qty 1

## 2020-10-04 MED ORDER — ONDANSETRON HCL 4 MG PO TABS: 4.0000 mg | ORAL_TABLET | Freq: Four times a day (QID) | ORAL | Status: DC | PRN

## 2020-10-04 MED ORDER — ONDANSETRON HCL 4 MG/2ML IJ SOLN: 4.0000 mg | Freq: Four times a day (QID) | INTRAMUSCULAR | Status: DC | PRN

## 2020-10-04 MED ORDER — PANTOPRAZOLE 80MG IVPB - SIMPLE MED
80.0000 mg | Freq: Once | INTRAVENOUS | Status: AC
  Administered 2020-10-04: 80 mg via INTRAVENOUS
  Filled 2020-10-04: qty 80

## 2020-10-04 MED ORDER — ACETAMINOPHEN 325 MG PO TABS: 650.0000 mg | ORAL_TABLET | Freq: Four times a day (QID) | ORAL | Status: DC | PRN

## 2020-10-04 MED ORDER — GABAPENTIN 300 MG PO CAPS
300.0000 mg | ORAL_CAPSULE | Freq: Three times a day (TID) | ORAL | Status: DC
  Administered 2020-10-04 – 2020-10-05 (×4): 300 mg via ORAL
  Filled 2020-10-04 (×4): qty 1

## 2020-10-04 MED ORDER — INSULIN ASPART 100 UNIT/ML IJ SOLN
0.0000 [IU] | Freq: Three times a day (TID) | INTRAMUSCULAR | Status: DC
  Administered 2020-10-06: 2 [IU] via SUBCUTANEOUS

## 2020-10-04 MED ORDER — HYDROCODONE-ACETAMINOPHEN 5-325 MG PO TABS: 1.0000 | ORAL_TABLET | ORAL | Status: DC | PRN

## 2020-10-04 MED ORDER — MELATONIN 5 MG PO TABS: 5.0000 mg | ORAL_TABLET | Freq: Every day | ORAL | Status: DC

## 2020-10-04 MED ORDER — ALBUTEROL SULFATE (2.5 MG/3ML) 0.083% IN NEBU
3.0000 mL | INHALATION_SOLUTION | Freq: Four times a day (QID) | RESPIRATORY_TRACT | Status: DC | PRN
  Administered 2020-10-05: 3 mL via RESPIRATORY_TRACT

## 2020-10-04 MED ORDER — SODIUM CHLORIDE 0.9 % IV BOLUS
1000.0000 mL | Freq: Once | INTRAVENOUS | Status: AC
  Administered 2020-10-04: 1000 mL via INTRAVENOUS

## 2020-10-04 MED ORDER — MAGNESIUM OXIDE -MG SUPPLEMENT 400 (240 MG) MG PO TABS
200.0000 mg | ORAL_TABLET | Freq: Every day | ORAL | Status: DC
  Administered 2020-10-04 – 2020-10-06 (×3): 200 mg via ORAL
  Filled 2020-10-04 (×3): qty 1

## 2020-10-04 MED ORDER — PANTOPRAZOLE SODIUM 40 MG IV SOLR
40.0000 mg | Freq: Two times a day (BID) | INTRAVENOUS | Status: DC
  Administered 2020-10-04 – 2020-10-07 (×7): 40 mg via INTRAVENOUS
  Filled 2020-10-04 (×7): qty 40

## 2020-10-04 MED ORDER — CARVEDILOL 3.125 MG PO TABS
3.1250 mg | ORAL_TABLET | Freq: Two times a day (BID) | ORAL | Status: DC
  Administered 2020-10-04 – 2020-10-05 (×3): 3.125 mg via ORAL
  Filled 2020-10-04 (×3): qty 1

## 2020-10-04 MED ORDER — METOPROLOL TARTRATE 5 MG/5ML IV SOLN: 5.0000 mg | Freq: Four times a day (QID) | INTRAVENOUS | Status: DC | PRN

## 2020-10-04 MED ORDER — ACETAMINOPHEN 650 MG RE SUPP: 650.0000 mg | Freq: Four times a day (QID) | RECTAL | Status: DC | PRN

## 2020-10-04 MED ORDER — ADULT MULTIVITAMIN W/MINERALS CH
1.0000 | ORAL_TABLET | Freq: Every morning | ORAL | Status: DC
  Administered 2020-10-04 – 2020-10-07 (×3): 1 via ORAL
  Filled 2020-10-04 (×3): qty 1

## 2020-10-04 MED ORDER — FUROSEMIDE 10 MG/ML IJ SOLN
20.0000 mg | Freq: Once | INTRAMUSCULAR | Status: AC
  Administered 2020-10-04: 20 mg via INTRAVENOUS
  Filled 2020-10-04: qty 2

## 2020-10-04 NOTE — ED Notes (Signed)
Vital signs stable. 

## 2020-10-04 NOTE — Anesthesia Preprocedure Evaluation (Addendum)
Anesthesia Evaluation  Patient identified by MRN, date of birth, ID band Patient awake    Reviewed: Allergy & Precautions, NPO status , Patient's Chart, lab work & pertinent test results, reviewed documented beta blocker date and time   History of Anesthesia Complications Negative for: history of anesthetic complications  Airway Mallampati: II  TM Distance: >3 FB Neck ROM: Full    Dental  (+) Dental Advisory Given, Missing   Pulmonary asthma , Current Smoker and Patient abstained from smoking.,    Pulmonary exam normal        Cardiovascular hypertension, Pt. on medications and Pt. on home beta blockers Normal cardiovascular exam+ Cardiac Defibrillator    HOCM  '21 TTE - EF 65 to 70%. Severe concentric left ventricular  hypertrophy. Grade II diastolic dysfunction (pseudonormalization). LA and RA were moderately dilated.     Neuro/Psych negative neurological ROS  negative psych ROS   GI/Hepatic Neg liver ROS, PUD, GERD  Medicated and Controlled,  Endo/Other  diabetes, Type 2, Oral Hypoglycemic AgentsMorbid obesity  Renal/GU Renal InsufficiencyRenal disease     Musculoskeletal  (+) Arthritis ,   Abdominal (+) + obese,   Peds  Hematology  (+) anemia ,  On eliquis    Anesthesia Other Findings   Reproductive/Obstetrics                            Anesthesia Physical Anesthesia Plan  ASA: 3  Anesthesia Plan: MAC   Post-op Pain Management:    Induction:   PONV Risk Score and Plan: 2 and Propofol infusion and Treatment may vary due to age or medical condition  Airway Management Planned: Nasal Cannula and Natural Airway  Additional Equipment: None  Intra-op Plan:   Post-operative Plan:   Informed Consent: I have reviewed the patients History and Physical, chart, labs and discussed the procedure including the risks, benefits and alternatives for the proposed anesthesia with the  patient or authorized representative who has indicated his/her understanding and acceptance.       Plan Discussed with: CRNA and Anesthesiologist  Anesthesia Plan Comments:        Anesthesia Quick Evaluation

## 2020-10-04 NOTE — ED Triage Notes (Signed)
Generalized weakness for about 2 weeks with leg swelling. She feels just like her last time she needed a blood transfusion. Gums and tongue are both pale.

## 2020-10-04 NOTE — ED Notes (Addendum)
MD Shalhoub notified of updated hgb

## 2020-10-04 NOTE — H&P (View-Only) (Signed)
Referring Provider: Dr. Billy Fischer, EDP Primary Care Physician:  [name], [place] Primary Gastroenterologist:  Dr. Ardis Hughs  Reason for Consultation:  Anemia and heme positive stool  HPI: Alexandra Bell is a 55 y.o. female with medical history significant of hypertrophic cardiomyopathy s/p ICD, essential hypertension, type 2 diabetes mellitus, chronic diastolic congestive heart failure, paroxysmal atrial fibrillation on Eliquis (last dose this morning, 9/27), tobacco use disorder, morbid obesity.  Was just hospitalized earlier this month for anemia (Hgb 4.5 grams) and heme positive stools and underwent EGD and colonoscopy as listed below.  She was discharged home with Hgb of 8.0 grams.  She presented back to [facility] ED now with complaints of fatigue and weakness that have been progressive over the past 2 weeks or so.  Hgb down to 4.3 grams again.  3 units PRBCs have been ordered, first transfusing currently.  INR 1.9.  Colonoscopy 09/13/20:  Non-bleeding internal hemorrhoids, otherwise normal to the TI.  EGD 09/13/20:  Non-bleeding clean based DUs and mild gastritis.  She was on diclofenac when she had her last EGD and continues to take that for arthritic type pains.  She says that she had one maroon stool 2 weeks ago but no further sign of bleeding since then, brown stool.  Has been on pantoprazole 40 mg BID at home.  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   Fatty liver 11/2010   GERD (gastroesophageal reflux disease)    HOCM (hypertrophic obstructive cardiomyopathy) ([place]) 12/10/2019   Hx: UTI (urinary tract infection)    Hypertension    Inguinal hernia    Morbid obesity ([place]) 12/10/2019   NSVT (nonsustained ventricular tachycardia) ([place]) 03/10/2020   Obesity, Class III, BMI 40-49.9 (morbid obesity) ([place])     Periumbilical hernia     Past Surgical History:  Procedure Laterality Date   ABDOMINAL HYSTERECTOMY     BIOPSY  09/13/2020   Procedure: BIOPSY;  Surgeon: Jackquline Denmark, MD;  Location: [facility] ENDOSCOPY;  Service: Endoscopy;;   BRONCHOSCOPY, LYMPH NODE BX  MAY 2011   CHOLECYSTECTOMY  2001   COLONOSCOPY N/A 09/13/2020   Procedure: COLONOSCOPY;  Surgeon: Jackquline Denmark, MD;  Location: [facility] ENDOSCOPY;  Service: Endoscopy;  Laterality: N/A;   ERCP/SPHINCTEROTOMY/STONE EXTRACTION  2001   ESOPHAGOGASTRODUODENOSCOPY (EGD) WITH PROPOFOL N/A 09/13/2020   Procedure: ESOPHAGOGASTRODUODENOSCOPY (EGD) WITH PROPOFOL;  Surgeon: Jackquline Denmark, MD;  Location: [facility] ENDOSCOPY;  Service: Endoscopy;  Laterality: N/A;   FLEXIBLE SIGMOIDOSCOPY  11/29/2010   Procedure: FLEXIBLE SIGMOIDOSCOPY;  Surgeon: Lafayette Dragon, MD;  Location: [facility] ENDOSCOPY;  Service: Endoscopy;  Laterality: N/A;   ICD IMPLANT N/A 03/25/2020   Procedure: ICD IMPLANT;  Surgeon: Vickie Epley, MD;  Location: [place] CV LAB;  Service: Cardiovascular;  Laterality: N/A;    Prior to Admission medications   Medication Sig Start Date End Date Taking? Authorizing Provider  acetaminophen (TYLENOL) 500 MG tablet Take 1,500 mg by mouth every 6 (six) hours as needed (For pain).    [provider]  albuterol (VENTOLIN HFA) 108 (90 Base) MCG/ACT inhaler Inhale 2 puffs into the lungs every 6 (six) hours as needed. For asthma 09/07/20   [name], FNP  amLODipine (NORVASC) 10 MG tablet Take 1 tablet (10 mg total) by mouth daily. 07/01/20 09/29/20  Vickie Epley, MD  apixaban (ELIQUIS) 5 MG TABS tablet Take 1 tablet by mouth twice daily 09/09/20   Skeet Latch, MD  carvedilol (COREG) 25 MG tablet Take 1 tablet (25 mg total) by mouth 2 (two) times daily. 03/10/20 09/11/20  Skeet Latch, MD  dapagliflozin propanediol (FARXIGA) 10 MG TABS tablet Take 1 tablet (10 mg total) by mouth daily before breakfast. 09/07/20   [name], FNP  diclofenac  Sodium (VOLTAREN) 1 % GEL APPLY 2 G TOPICALLY FOUR TIMES DAILY. 06/13/20   [name], FNP  Diclofenac Sodium CR 100 MG 24 hr tablet Take 1 tablet (100 mg total) by mouth every morning. 07/28/20   [name], FNP  furosemide (LASIX) 80 MG tablet Take 1 tablet (80 mg total) by mouth daily. 03/10/20   Skeet Latch, MD  gabapentin (NEURONTIN) 300 MG capsule TAKE 1 CAPSULE BY MOUTH THREE TIMES DAILY Patient taking differently: Take 300 mg by mouth 3 (three) times daily. 07/12/20   [name], FNP  lisinopril (ZESTRIL) 40 MG tablet Take 1 tablet (40 mg total) by mouth every morning. 09/09/20   Deberah Pelton, NP  Magnesium 250 MG TABS Take 250 mg by mouth daily at 2 PM.    [provider]  melatonin 5 MG TABS Take 5 mg by mouth daily at 2 PM.    [provider]  metFORMIN (GLUCOPHAGE) 500 MG tablet TAKE 1 TABLET BY MOUTH TWICE DAILY WITH A MEAL 09/16/20   [name], FNP  Multiple Vitamin (MULTIVITAMIN WITH MINERALS) TABS tablet Take 1 tablet by mouth in the morning. One-A-Day Women's Multivitamin    [provider]  pantoprazole (PROTONIX) 40 MG tablet Take 1 tablet (40 mg total) by mouth 2 (two) times daily. 09/14/20 11/13/20  Antonieta Pert, MD    Current Facility-Administered Medications  Medication Dose Route Frequency Provider Last Rate Last Admin   0.9 %  sodium chloride infusion  10 mL/hr Intravenous Once Gareth Morgan, MD       Current Outpatient Medications  Medication Sig Dispense Refill   acetaminophen (TYLENOL) 500 MG tablet Take 1,500 mg by mouth every 6 (six) hours as needed (For pain).     albuterol (VENTOLIN HFA) 108 (90 Base) MCG/ACT inhaler Inhale 2 puffs into the lungs every 6 (six) hours as needed. For asthma 1 each 3   amLODipine (NORVASC) 10 MG tablet Take 1 tablet (10 mg total) by mouth daily. 90 tablet 3   apixaban (ELIQUIS) 5 MG TABS tablet Take 1 tablet by mouth twice daily 180 tablet 1   carvedilol (COREG) 25 MG tablet Take 1 tablet (25 mg  total) by mouth 2 (two) times daily. 180 tablet 3   dapagliflozin propanediol (FARXIGA) 10 MG TABS tablet Take 1 tablet (10 mg total) by mouth daily before breakfast. 30 tablet 3   diclofenac Sodium (VOLTAREN) 1 % GEL APPLY 2 G TOPICALLY FOUR TIMES DAILY. 400 g 0   Diclofenac Sodium CR 100 MG 24 hr tablet Take 1 tablet (100 mg total) by mouth every morning. 90 tablet 1   furosemide (LASIX) 80 MG tablet Take 1 tablet (80 mg total) by mouth daily. 90 tablet 1   gabapentin (NEURONTIN) 300 MG capsule TAKE 1 CAPSULE BY MOUTH THREE TIMES DAILY (Patient taking differently: Take 300 mg by mouth 3 (three) times daily.) 240 capsule 0   lisinopril (ZESTRIL) 40 MG tablet Take 1 tablet (40 mg total) by mouth every morning. 30 tablet 6   Magnesium 250 MG TABS Take 250 mg by mouth  daily at 2 PM.     melatonin 5 MG TABS Take 5 mg by mouth daily at 2 PM.     metFORMIN (GLUCOPHAGE) 500 MG tablet TAKE 1 TABLET BY MOUTH TWICE DAILY WITH A MEAL 180 tablet 2   Multiple Vitamin (MULTIVITAMIN WITH MINERALS) TABS tablet Take 1 tablet by mouth in the morning. One-A-Day Women's Multivitamin     pantoprazole (PROTONIX) 40 MG tablet Take 1 tablet (40 mg total) by mouth 2 (two) times daily. 60 tablet 1    Allergies as of 10/04/2020   (No Known Allergies)    Family History  Problem Relation Age of Onset   Diabetes Mother    Colon cancer Father        ?   Heart attack Father    Diabetes Sister     Social History   Socioeconomic History   Marital status: Married    Spouse name: Not on file   Number of children: 2   Years of education: Not on file   Highest education level: Not on file  Occupational History   Occupation: Food prep, Metallurgist: UNEMPLOYED  Tobacco Use   Smoking status: Every Day    Packs/day: 0.50    Years: 30.00    Pack years: 15.00    Types: Cigarettes   Smokeless tobacco: Never   Tobacco comments:    she is less than a PPD - states down to 6 cigarettes a day - 10/14  Vaping  Use   Vaping Use: Never used  Substance and Sexual Activity   Alcohol use: No   Drug use: No   Sexual activity: Not on file  Other Topics Concern   Not on file  Social History Narrative   Not on file   Social Determinants of Health   Financial Resource Strain: Not on file  Food Insecurity: Not on file  Transportation Needs: Not on file  Physical Activity: Not on file  Stress: Not on file  Social Connections: Not on file  Intimate Partner Violence: Not on file    Review of Systems: ROS is O/W negative except as mentioned in HPI.  Physical Exam: Vital signs in last 24 hours: Temp:  [98.2 F (36.8 C)] 98.2 F (36.8 C) (09/27 0856) Pulse Rate:  [64-75] 64 (09/27 0945) Resp:  [15-21] 21 (09/27 0945) BP: (97-119)/(41-46) 119/41 (09/27 0945) SpO2:  [98 %-99 %] 98 % (09/27 0945) Weight:  [242 kg] 125 kg (09/27 0858)   General:  Alert, Well-developed, well-nourished, pleasant and cooperative in NAD Head:  Normocephalic and atraumatic. Eyes:  Sclera clear, no icterus.  Conjunctiva pale. Ears:  Normal auditory acuity. Mouth:  No deformity or lesions.   Lungs:  Clear throughout to auscultation.  No wheezes, crackles, or rhonchi.  Heart:  Regular rate and rhythm; SEM noted. Abdomen:  Soft, non-distended.  BS present.  Non-tender.  Rectal:  Brown, heme positive per EDP.  Msk:  Symmetrical without gross deformities. Pulses:  Normal pulses noted. Extremities:  2-3+ pitting edema in B/L LEs Neurologic:  Alert and oriented x 4;  grossly normal neurologically. Skin:  Intact without significant lesions or rashes. Psych:  Alert and cooperative. Normal mood and affect.  Lab Results: Recent Labs    10/04/20 0846  WBC 10.0  HGB 4.3*  HCT 15.7*  PLT 277   BMET Recent Labs    10/04/20 0846  NA 138  K 4.3  CL 107  CO2 21*  GLUCOSE 123*  BUN  46*  CREATININE 1.62*  CALCIUM 9.5   LFT Recent Labs    10/04/20 0846  PROT 5.6*  ALBUMIN 3.0*  AST 17  ALT 11  ALKPHOS 45   BILITOT 0.5   PT/INR Recent Labs    10/04/20 0846  LABPROT 21.9*  INR 1.9*    Studies/Results: DG Chest Portable 1 View  Result Date: 10/04/2020 CLINICAL DATA:  Shortness of breath. EXAM: PORTABLE CHEST 1 VIEW COMPARISON:  September 11, 2020. FINDINGS: Stable cardiomegaly. Left-sided pacemaker is unchanged in position. Lungs are clear. Bony thorax is unremarkable. IMPRESSION: No active disease. Electronically Signed   By: Marijo Conception M.D.   On: 10/04/2020 09:47    IMPRESSION:  *GI bleed:  Suspect upper source (likely subacute bleed) with maroon stool a couple of weeks ago and now brown but heme positive and presence of duodenal ulcers on EGD earlier this month and long-standing NSAID use with diclofenac.  BUN slightly elevated at 24.  *ALBA/iron deficiency anemia:  Secondary to above.  Hgb 4.3 grams on admission compared to 8.0 grams on 9/7.  Receiving 3 units PRBCs.  Was on pantoprazole 40 mg BID at home. *PAF on Eliquis with last dose this AM, 9/27. *NSAID use as above *Coagulopathy with INR 1.9.  ? Due to Eliquis.  PLAN: *Will plan for EGD tomorrow morning, 9/28. *Has been given PPI bolus.  Will start IV BID for now. *Monitor Hgb.   Laban Emperor. Prem Coykendall  10/04/2020, 10:50 AM

## 2020-10-04 NOTE — H&P (Addendum)
History and Physical    DOA: 10/04/2020  PCP: [name], FNP  Patient coming from: Home  Chief Complaint: Fatigue and weakness  HPI: Alexandra Bell is a 55 y.o. female with history h/o GERD, hypertension, morbid obesity,HOCM, NSVT, paroxysmal A. fib on Eliquis and s/p AICD/pacemaker, history of CBD stones s/p cholecystectomy, chronic arthritis who was recently admitted to this [facility] (9/4-9/7) for acute blood loss anemia in the setting of Eliquis use with hemoglobin 4.9->transfused 3 units of blood at that time and seen by GI->underwent EGD/colonoscopy showing nonbleeding duodenal ulcers, mild gastritis and nonbleeding internal hemorrhoids->discharged home with hemoglobin improved/stabilized at 7.6-8.0.  Today, patient returns with complaints of feeling weak, lightheaded and extreme fatigue over the last 2 days.  Since last discharge patient resumed taking Eliquis.  She denies any ongoing melena or hematochezia although reports noticing maroon stool x1 last week on 9/14.  She does report taking p.o. Diclofenac daily for arthritic pains in her knees.  She also uses topical analgesics but states rarely help.  She has chronic leg swellings at baseline and takes 80 mg Lasix for this but denies any history of heart failure per se.  She did notice worsening of leg swellings to some extent over the last week but denies any chest pain or dyspnea or palpitations or double vision or nausea or vomiting. ED course: Afebrile, hypotensive on arrival with blood pressure 97/46-received about 600 mL of normal saline bolus after which blood pressure improved to 112/43 and IV fluids discontinued.  Pulse 62, respiratory rate 22 and currently blood pressure at 126/50. Lab work showed WBC 10 K, hemoglobin 4.3, platelets 277, sodium 138, potassium 4.3, BUN 46, creatinine 1.62, INR 1.9, glucose 123, calcium 9.5.  Rectal exam by EDP showed brown stool and occult blood positive.  Patient initiated on PRBC  transfusion and requested to be admitted for further evaluation and management.  GI has been contacted by EDP.  Review of Systems: As per HPI, otherwise review of systems negative.    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   Fatty liver 11/2010   GERD (gastroesophageal reflux disease)    HOCM (hypertrophic obstructive cardiomyopathy) ([place]) 12/10/2019   Hx: UTI (urinary tract infection)    Hypertension    Inguinal hernia    Morbid obesity ([place]) 12/10/2019   NSVT (nonsustained ventricular tachycardia) ([place]) 03/10/2020   Obesity, Class III, BMI 40-49.9 (morbid obesity) ([place])    Periumbilical hernia     Past Surgical History:  Procedure Laterality Date   ABDOMINAL HYSTERECTOMY     BIOPSY  09/13/2020   Procedure: BIOPSY;  Surgeon: Jackquline Denmark, MD;  Location: [facility] ENDOSCOPY;  Service: Endoscopy;;   BRONCHOSCOPY, LYMPH NODE BX  MAY 2011   CHOLECYSTECTOMY  2001   COLONOSCOPY N/A 09/13/2020   Procedure: COLONOSCOPY;  Surgeon: Jackquline Denmark, MD;  Location: [facility] ENDOSCOPY;  Service: Endoscopy;  Laterality: N/A;   ERCP/SPHINCTEROTOMY/STONE EXTRACTION  2001   ESOPHAGOGASTRODUODENOSCOPY (EGD) WITH PROPOFOL N/A 09/13/2020   Procedure: ESOPHAGOGASTRODUODENOSCOPY (EGD) WITH PROPOFOL;  Surgeon: Jackquline Denmark, MD;  Location: [facility] ENDOSCOPY;  Service: Endoscopy;  Laterality: N/A;   FLEXIBLE SIGMOIDOSCOPY  11/29/2010   Procedure: FLEXIBLE SIGMOIDOSCOPY;  Surgeon: Lafayette Dragon, MD;  Location: [facility] ENDOSCOPY;  Service: Endoscopy;  Laterality: N/A;   ICD IMPLANT N/A 03/25/2020  Procedure: ICD IMPLANT;  Surgeon: Vickie Epley, MD;  Location: [place] CV LAB;  Service: Cardiovascular;  Laterality: N/A;    Social history:  reports that she has been smoking cigarettes. She has a 15.00 pack-year smoking history. She has never used  smokeless tobacco. She reports that she does not drink alcohol and does not use drugs.   No Known Allergies  Family History  Problem Relation Age of Onset   Diabetes Mother    Colon cancer Father        ?   Heart attack Father    Diabetes Sister       Prior to Admission medications   Medication Sig Start Date End Date Taking? Authorizing Provider  acetaminophen (TYLENOL) 500 MG tablet Take 1,500 mg by mouth every 6 (six) hours as needed (For pain).   Yes [provider]  albuterol (VENTOLIN HFA) 108 (90 Base) MCG/ACT inhaler Inhale 2 puffs into the lungs every 6 (six) hours as needed. For asthma 09/07/20  Yes [name], FNP  amLODipine (NORVASC) 10 MG tablet Take 1 tablet (10 mg total) by mouth daily. 07/01/20 10/04/20 Yes Vickie Epley, MD  apixaban Arne Cleveland) 5 MG TABS tablet Take 1 tablet by mouth twice daily 09/09/20  Yes Skeet Latch, MD  carvedilol (COREG) 25 MG tablet Take 1 tablet (25 mg total) by mouth 2 (two) times daily. 03/10/20 10/04/20 Yes Skeet Latch, MD  dapagliflozin propanediol (FARXIGA) 10 MG TABS tablet Take 1 tablet (10 mg total) by mouth daily before breakfast. 09/07/20  Yes [name], FNP  diclofenac Sodium (VOLTAREN) 1 % GEL APPLY 2 G TOPICALLY FOUR TIMES DAILY. Patient taking differently: Apply 2 g topically daily as needed (For pain). 06/13/20  Yes [name], FNP  Diclofenac Sodium CR 100 MG 24 hr tablet Take 1 tablet (100 mg total) by mouth every morning. 07/28/20  Yes [name], FNP  furosemide (LASIX) 80 MG tablet Take 1 tablet (80 mg total) by mouth daily. 03/10/20  Yes Skeet Latch, MD  gabapentin (NEURONTIN) 300 MG capsule TAKE 1 CAPSULE BY MOUTH THREE TIMES DAILY Patient taking differently: Take 300 mg by mouth 3 (three) times daily. 07/12/20  Yes [name], FNP  lisinopril (ZESTRIL) 40 MG tablet Take 1 tablet (40 mg total) by mouth every morning. Patient taking differently: Take 40 mg by mouth daily. 09/09/20  Yes  Cleaver, Jossie Ng, NP  Magnesium 250 MG TABS Take 250 mg by mouth daily at 2 PM.   Yes [provider]  melatonin 5 MG TABS Take 5 mg by mouth daily at 2 PM.   Yes [provider]  metFORMIN (GLUCOPHAGE) 500 MG tablet TAKE 1 TABLET BY MOUTH TWICE DAILY WITH A MEAL Patient taking differently: Take 500 mg by mouth 2 (two) times daily with a meal. 09/16/20  Yes [name], FNP  Multiple Vitamin (MULTIVITAMIN WITH MINERALS) TABS tablet Take 1 tablet by mouth daily.   Yes [provider]  pantoprazole (PROTONIX) 40 MG tablet Take 1 tablet (40 mg total) by mouth 2 (two) times daily. 09/14/20 11/13/20 Yes Antonieta Pert, MD    Physical Exam: Vitals:   10/04/20 1052 10/04/20 1108 10/04/20 1318 10/04/20 1323  BP: (!) 122/47 (!) 126/50 107/73 108/70  Pulse: 61 62 (!) 122 67  Resp: (!) 24 (!) 22 (!) 22 (!) 22  Temp: 98.3 F (36.8 C) 98.2 F (36.8 C) 97.6 F (36.4 C) 97.6 F (36.4 C)  TempSrc: Oral Oral Oral   SpO2:  99% 99%  99%  Weight:      Height:        Constitutional: Obese female, appears calm, comfortable, receiving PRBC transfusion Eyes: PERRL, lids and conjunctivae normal ENMT: Mucous membranes are moist. Posterior pharynx clear of any exudate or lesions.Normal dentition.  Neck: normal, supple, no masses, no thyromegaly Respiratory: clear to auscultation bilaterally, no wheezing, no crackles. Normal respiratory effort. No accessory muscle use.  Cardiovascular: Regular rate and rhythm, no murmurs / rubs / gallops. No extremity edema. 2+ pedal pulses. No carotid bruits.  S/p pacemaker/AICD-LACW Abdomen: no tenderness, no masses palpated. No hepatosplenomegaly. Bowel sounds positive.  Musculoskeletal: no clubbing / cyanosis. No joint deformity upper and lower extremities. Good ROM, no contractures. Normal muscle tone.  Neurologic: CN 2-12 grossly intact. Sensation intact, DTR normal. Strength 5/5 in all 4.  Psychiatric: Normal judgment and insight. Alert and oriented  x 3. Normal mood.  SKIN/catheters: no rashes, lesions, ulcers. No induration  Labs on Admission: I have personally reviewed following labs and imaging studies  CBC: Recent Labs  Lab 10/04/20 0846  WBC 10.0  NEUTROABS 7.9*  HGB 4.3*  HCT 15.7*  MCV 84.0  PLT 841   Basic Metabolic Panel: Recent Labs  Lab 10/04/20 0846  NA 138  K 4.3  CL 107  CO2 21*  GLUCOSE 123*  BUN 46*  CREATININE 1.62*  CALCIUM 9.5   GFR: Estimated Creatinine Clearance: 52.4 mL/min (A) (by C-G formula based on SCr of 1.62 mg/dL (H)). Recent Labs  Lab 10/04/20 0846  WBC 10.0   Liver Function Tests: Recent Labs  Lab 10/04/20 0846  AST 17  ALT 11  ALKPHOS 45  BILITOT 0.5  PROT 5.6*  ALBUMIN 3.0*   No results for input(s): LIPASE, AMYLASE in the last 168 hours. No results for input(s): AMMONIA in the last 168 hours. Coagulation Profile: Recent Labs  Lab 10/04/20 0846  INR 1.9*   Cardiac Enzymes: No results for input(s): CKTOTAL, CKMB, CKMBINDEX, TROPONINI in the last 168 hours. BNP (last 3 results) No results for input(s): PROBNP in the last 8760 hours. HbA1C: No results for input(s): HGBA1C in the last 72 hours. CBG: No results for input(s): GLUCAP in the last 168 hours. Lipid Profile: No results for input(s): CHOL, HDL, LDLCALC, TRIG, CHOLHDL, LDLDIRECT in the last 72 hours. Thyroid Function Tests: No results for input(s): TSH, T4TOTAL, FREET4, T3FREE, THYROIDAB in the last 72 hours. Anemia Panel: No results for input(s): VITAMINB12, FOLATE, FERRITIN, TIBC, IRON, RETICCTPCT in the last 72 hours. Urine analysis:    Component Value Date/Time   COLORURINE YELLOW 11/28/2010 1033   APPEARANCEUR HAZY (A) 11/28/2010 1033   LABSPEC 1.021 11/28/2010 1033   PHURINE 5.5 11/28/2010 1033   GLUCOSEU NEGATIVE 11/28/2010 1033   HGBUR MODERATE (A) 11/28/2010 1033   BILIRUBINUR negative 05/07/2019 1645   KETONESUR NEGATIVE 11/28/2010 1033   PROTEINUR Negative 05/07/2019 1645   PROTEINUR  30 (A) 11/28/2010 1033   UROBILINOGEN 0.2 05/07/2019 1645   UROBILINOGEN 0.2 11/28/2010 1033   NITRITE negative 05/07/2019 1645   NITRITE NEGATIVE 11/28/2010 1033   LEUKOCYTESUR Negative 05/07/2019 1645    Radiological Exams on Admission: Personally reviewed  CT ABDOMEN PELVIS WO CONTRAST  Result Date: 10/04/2020 CLINICAL DATA:  GI bleed, recurrent anemia on Eliquis EXAM: CT ABDOMEN AND PELVIS WITHOUT CONTRAST TECHNIQUE: Multidetector CT imaging of the abdomen and pelvis was performed following the standard protocol without IV contrast. COMPARISON:  CT abdomen/pelvis 11/28/2010 FINDINGS: Lower chest: There is mosaic attenuation  in the lung bases likely reflecting air trapping. The heart is enlarged. Cardiac device leads are partially imaged. There is trace pericardial fluid. Hepatobiliary: The liver is unremarkable, within the confines of noncontrast technique. The gallbladder is surgically absent. There is no intra or extrahepatic biliary ductal dilatation. Pancreas: Unremarkable. Spleen: Unremarkable. Adrenals/Urinary Tract: There is an approximately 1.6 cm left adrenal nodule measuring 1.6 Hounsfield units consistent with a benign adenoma. The right adrenal is unremarkable. The kidneys are unremarkable. There are no focal lesions, within the confines of noncontrast technique. There are no stones. There is no hydronephrosis or hydroureter. The bladder is unremarkable. Stomach/Bowel: The stomach is unremarkable. There is no evidence of bowel obstruction. There is no abnormal bowel wall thickening or inflammatory change. There is no definite blood in the bowel lumen, though this is suboptimally evaluated given single-phase noncontrast technique. Vascular/Lymphatic: There is scattered calcified atherosclerotic plaque in the nonaneurysmal abdominal aorta. There is no abdominal or pelvic lymphadenopathy. Reproductive: The uterus is surgically absent. There is no adnexal mass. Other: There is small volume  simple fluid in the pelvis, nonspecific. There is no free intraperitoneal air. There is no evidence of retroperitoneal hematoma. There is a small fat containing umbilical hernia, not significantly changed since 2012. There is a small fat containing right inguinal hernia. Musculoskeletal: There is grade 1 anterolisthesis of L4 on L5 and L5 on S1. 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 There is no acute osseous abnormality or aggressive osseous lesion. IMPRESSION: 1. No definite evidence of GI bleed, suboptimally evaluated given single-phase noncontrast technique. 2. No evidence of retroperitoneal hematoma. 3. Small volume simple fluid in the pelvis, nonspecific. 4. Benign left adrenal adenoma. 5. Cardiomegaly with trace pericardial effusion. 6. Mosaic attenuation in the lung bases likely reflecting air trapping. Aortic Atherosclerosis (ICD10-I70.0). Electronically Signed   By: Valetta Mole M.D.   On: 10/04/2020 12:29   DG Chest Portable 1 View  Result Date: 10/04/2020 CLINICAL DATA:  Shortness of breath. EXAM: PORTABLE CHEST 1 VIEW COMPARISON:  September 11, 2020. FINDINGS: Stable cardiomegaly. Left-sided pacemaker is unchanged in position. Lungs are clear. Bony thorax is unremarkable. IMPRESSION: No active disease. Electronically Signed   By: Marijo Conception M.D.   On: 10/04/2020 09:47    EKG: Independently reviewed.  Sinus rhythm with nonsignificant ST changes in V1, T wave inversions in lateral leads similar to prior EKGs which also showed paced rhythm at that time.     Assessment and Plan:   Principal Problem:   Symptomatic anemia Active Problems:   Upper GI bleed   Essential hypertension   Acute renal failure (HCC)   Asthma   HOCM (hypertrophic obstructive cardiomyopathy) (HCC)   Morbid obesity (HCC)   NSVT (nonsustained ventricular tachycardia) (HCC)   AF (paroxysmal atrial fibrillation) (HCC)   Tobacco use disorder   Type 2 diabetes mellitus ([place])   Diabetic neuropathy  ([place])    1.Acute symptomatic anemia: Likely secondary to ongoing blood loss in the setting of duodenal ulcers, anticoagulant and NSAID use.  Patient most recently had EGD/colonoscopy showing mild gastritis, nonbleeding duodenal ulcers and nonbleeding internal hemorrhoids.  Although patient could have recurrent oozing from ulcers, would like to rule out retroperitoneal hematoma or other sources in the setting of blood thinner use.Patient received 80 mg IV PPI in the ED, will resume on admission.  Has orders for 3 unit PRBC transfusions and first unit transfusing currently.  Will obtain posttransfusion hemoglobin and ordered Lasix between transfusions if blood pressure tolerates.  Formal GI evaluation pending for possible repeat EGD/capsule study in a.m.  CLD for now, n.p.o. past MN.  CT abdomen WO contrast to rule out retroperitoneal bleed ordered.  Advised to avoid NSAIDs.  Noted surgical path report from 9/6-unclear if H. pylori tested  2.  Hypertension: We will hold home antihypertensives/diuretics for now.  She did receive 600 mL fluid bolus in the ED and normotensive currently.  Resume Coreg at lower dose with holding parameters.  Will plan to resume other meds as blood pressure improves after transfusion.  3.  Paroxysmal atrial fibrillation: Resumed Coreg at lower dose.  Holding anticoagulation in view of problem #1.  Patient is s/p pacemaker.  May benefit from cardiology evaluation regarding risk/benefits of resumption of anticoagulation prior to discharge  4.  Acute renal failure: Creatinine 0.6->1.6 now in the setting of problem #1 and volume loss/hypotension on presentation.  Holding diuretics/ACE inhibitors.  Received small fluid bolus in the ED and now receiving PRBC x3.  Repeat labs in AM.  5.  Diabetes mellitus type 2 with neuropathy: On oral hypoglycemics/metformin at home.  Will hold and treat with SSI for now.  Resume home meds upon discharge as appropriate-repeat BMP for creatinine after  volume resuscitation.  Resume gabapentin  6.  HOCM, NSVT: S/p AICD.  Denies history of pulmonary edema but reports taking Lasix for leg swellings.  Currently leg swellings exacerbated by problem #1.  Holding p.o. Lasix until blood pressure improves further.  Will give small dose of IV diuretic between transfusions and repeat BMP for renal function in a.m.  7.  Asthma: Currently stable, no wheezing.  Resume home inhaler therapy.  8.  Chronic arthritis, knee pains: Advised patient to avoid NSAIDs with history of duodenal ulcers and anticoagulant use..  Will utilize topical cream for now, tramadol as needed  9.Tobacco use: Reports cutting down recently and a pack now lasts for 3 days.  Encourage patient to quit given underlying heart disease/arrhythmias.  She acknowledges the need to quit but motivation questionable.  10.. Morbid obesity class III (BMI 40-49.9): Advised diet compliance, follow-up PCP for long-term goals.  DVT prophylaxis: SCDs, anticoagulants on hold due to GIB  COVID screen: Negative  Code Status: Full code.  Patient/Family Communication: Discussed with patient and all questions answered to satisfaction.  Consults called: [facility] (EDP discussed with APP-Jessica) Admission status :Patient will be admitted under OBSERVATION status.The patient's presenting symptoms, physical exam findings, and initial radiographic and laboratory data in the context of their medical condition is felt to place them at low risk for further clinical deterioration. Furthermore, it is anticipated that the patient will be medically stable for discharge from the hospital within 2 midnights of hospital stay.       Guilford Shi MD [facility] Pager in [place]  If 7PM-7AM, please contact night-coverage [email or web address]   10/04/2020, 1:33 PM

## 2020-10-04 NOTE — ED Notes (Signed)
Ashley(daughter)- [phone] Patrice(sister)- [phone] They would like to be updated if patient receives a room.

## 2020-10-04 NOTE — ED Provider Notes (Signed)
[facility] EMERGENCY DEPARTMENT Provider Note   CSN: [id number] Arrival date & time: 10/04/20  5956     History Chief Complaint  Patient presents with   Weakness   Leg Swelling    Bilateral for about two weeks.     Alexandra Bell is a 55 y.o. female.  HPI      55 year old female with a history of hypertrophic cardiomyopathy status post ICD, hypertension, type 2 diabetes, chronic diastolic dysfunction, paroxysmal atrial fibrillation on Eliquis, smoking, recent admission for symptomatic anemia September 7 with diagnosis of nonbleeding duodenal ulcers on endoscopy, gastritis, resumed on Eliquis, who presents with concern for lightheadedness and generalized weakness.  Reports that she tried to get back to normal over her after her last admission, however after the last 2 weeks she has had continuing and worsening fatigue and lightheadedness.  Reports difficulty walking due to her lightheadedness.  Denies fevers, cough, abdominal pain, nausea, vomiting, diarrhea, chest pain, or shortness of breath.  Reports to significant fatigue.  Reports she had 1 bloody bowel movement since leaving the hospital on around 9/14--was bloody stool, just one and since then has been brown.  She has been constipated.  No focal numbness, weakness, speech problem blindness or visual changes.  Bilateral leg swelling has been the same since leaving the hospital.  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   Fatty liver 11/2010   GERD (gastroesophageal reflux disease)    HOCM (hypertrophic obstructive cardiomyopathy) ([place]) 12/10/2019   Hx: UTI (urinary tract infection)    Hypertension    Inguinal hernia    Morbid obesity ([place]) 12/10/2019   NSVT (nonsustained ventricular tachycardia) ([place]) 03/10/2020   Obesity, Class  III, BMI 40-49.9 (morbid obesity) ([place])    Periumbilical hernia     Patient Active Problem List   Diagnosis Date Noted   Upper GI bleed 09/11/2020   AF (paroxysmal atrial fibrillation) ([place]) 09/11/2020   Tobacco use disorder 09/11/2020   Type 2 diabetes mellitus ([place]) 09/11/2020   Diabetic neuropathy ([place]) 09/11/2020   NSVT (nonsustained ventricular tachycardia) ([place]) 03/10/2020   HOCM (hypertrophic obstructive cardiomyopathy) ([place]) 12/10/2019   Morbid obesity ([place]) 12/10/2019   Eosinophilia 01/16/2011   Colitis, eosinophilic 16/01/930   Leukocytosis 11/28/2010   Essential hypertension 05/18/2009   ASTHMA 05/18/2009   Nonspecific (abnormal) findings on radiological and other examination of body structure 05/18/2009   COMPUTERIZED TOMOGRAPHY, CHEST, ABNORMAL 05/18/2009    Past Surgical History:  Procedure Laterality Date   ABDOMINAL HYSTERECTOMY     BIOPSY  09/13/2020   Procedure: BIOPSY;  Surgeon: Jackquline Denmark, MD;  Location: [facility];  Service: Endoscopy;;   BRONCHOSCOPY, LYMPH NODE BX  MAY 2011   CHOLECYSTECTOMY  2001   COLONOSCOPY N/A 09/13/2020   Procedure: COLONOSCOPY;  Surgeon: Jackquline Denmark, MD;  Location: [facility] ENDOSCOPY;  Service: Endoscopy;  Laterality: N/A;   ERCP/SPHINCTEROTOMY/STONE EXTRACTION  2001   ESOPHAGOGASTRODUODENOSCOPY (EGD) WITH PROPOFOL N/A 09/13/2020   Procedure: ESOPHAGOGASTRODUODENOSCOPY (EGD) WITH PROPOFOL;  Surgeon: Jackquline Denmark, MD;  Location[facility] ENDOSCOPY;  Service: Endoscopy;  Laterality: N/A;   FLEXIBLE SIGMOIDOSCOPY  11/29/2010   Procedure: FLEXIBLE SIGMOIDOSCOPY;  Surgeon: Lafayette Dragon, MD;  Location: [facility] ENDOSCOPY;  Service: Endoscopy;  Laterality: N/A;   ICD IMPLANT N/A 03/25/2020   Procedure: ICD IMPLANT;  Surgeon: Vickie Epley, MD;  Location: [facility] CV LAB;  Service: Cardiovascular;  Laterality: N/A;     OB History   No obstetric history on file.     Family History  Problem Relation Age of Onset   Diabetes Mother    Colon  cancer Father        ?   Heart attack Father    Diabetes Sister     Social History   Tobacco Use   Smoking status: Every Day    Packs/day: 0.50    Years: 30.00    Pack years: 15.00    Types: Cigarettes   Smokeless tobacco: Never   Tobacco comments:    she is less than a PPD - states down to 6 cigarettes a day - 10/14  Vaping Use   Vaping Use: Never used  Substance Use Topics   Alcohol use: No   Drug use: No    Home Medications Prior to Admission medications   Medication Sig Start Date End Date Taking? Authorizing Provider  acetaminophen (TYLENOL) 500 MG tablet Take 1,500 mg by mouth every 6 (six) hours as needed (For pain).    [provider]  albuterol (VENTOLIN HFA) 108 (90 Base) MCG/ACT inhaler Inhale 2 puffs into the lungs every 6 (six) hours as needed. For asthma 09/07/20   [name], FNP  amLODipine (NORVASC) 10 MG tablet Take 1 tablet (10 mg total) by mouth daily. 07/01/20 09/29/20  Vickie Epley, MD  apixaban Arne [place]) 5 MG TABS tablet Take 1 tablet by mouth twice daily 09/09/20   Skeet Latch, MD  carvedilol (COREG) 25 MG tablet Take 1 tablet (25 mg total) by mouth 2 (two) times daily. 03/10/20 09/11/20  Skeet Latch, MD  dapagliflozin propanediol (FARXIGA) 10 MG TABS tablet Take 1 tablet (10 mg total) by mouth daily before breakfast. 09/07/20   [name], FNP  diclofenac Sodium (VOLTAREN) 1 % GEL APPLY 2 G TOPICALLY FOUR TIMES DAILY. 06/13/20   [name], FNP  Diclofenac Sodium CR 100 MG 24 hr tablet Take 1 tablet (100 mg total) by mouth every morning. 07/28/20   [name], FNP  furosemide (LASIX) 80 MG tablet Take 1 tablet (80 mg total) by mouth daily. 03/10/20   Skeet Latch, MD  gabapentin (NEURONTIN) 300 MG capsule TAKE 1 CAPSULE BY MOUTH THREE TIMES DAILY Patient taking differently: Take 300 mg by mouth 3 (three) times daily. 07/12/20   [name], FNP  lisinopril (ZESTRIL) 40 MG tablet Take 1 tablet (40 mg total) by mouth every  morning. 09/09/20   Deberah Pelton, NP  Magnesium 250 MG TABS Take 250 mg by mouth daily at 2 PM.    [provider]  melatonin 5 MG TABS Take 5 mg by mouth daily at 2 PM.    [provider]  metFORMIN (GLUCOPHAGE) 500 MG tablet TAKE 1 TABLET BY MOUTH TWICE DAILY WITH A MEAL 09/16/20   [name], FNP  Multiple Vitamin (MULTIVITAMIN WITH MINERALS) TABS tablet Take 1 tablet by mouth in the morning. One-A-Day Women's Multivitamin    [provider]  pantoprazole (PROTONIX) 40 MG tablet Take 1 tablet (40 mg total) by mouth 2 (two) times daily. 09/14/20 11/13/20  Antonieta Pert, MD    Allergies    Patient has no known allergies.  Review of Systems   Review of Systems  Constitutional:  Positive for fatigue. Negative for fever.  HENT:  Negative for sore throat.   Eyes:  Negative for  visual disturbance.  Respiratory:  Negative for cough and shortness of breath.   Cardiovascular:  Negative for chest pain.  Gastrointestinal:  Positive for constipation. Negative for abdominal pain, diarrhea, nausea and vomiting.  Genitourinary:  Negative for difficulty urinating.  Musculoskeletal:  Negative for back pain.  Skin:  Negative for rash.  Neurological:  Positive for light-headedness. Negative for syncope, facial asymmetry, weakness, numbness and headaches.   Physical Exam Updated Vital Signs BP (!) 122/47   Pulse 61   Temp 98.3 F (36.8 C) (Oral)   Resp (!) 24   Ht 5\' 6"  (1.676 m)   Wt 125 kg   SpO2 99%   BMI 44.48 kg/m   Physical Exam Vitals and nursing note reviewed.  Constitutional:      General: She is not in acute distress.    Appearance: She is well-developed. She is ill-appearing. She is not diaphoretic.  HENT:     Head: Normocephalic and atraumatic.  Eyes:     Comments: Pale conjunctiva  Cardiovascular:     Rate and Rhythm: Normal rate and regular rhythm.     Heart sounds: Normal heart sounds. No murmur heard.   No friction rub. No gallop.  Pulmonary:      Effort: Pulmonary effort is normal. No respiratory distress.     Breath sounds: Normal breath sounds. No wheezing or rales.  Abdominal:     General: There is no distension.     Palpations: Abdomen is soft.     Tenderness: There is no abdominal tenderness. There is no guarding.  Musculoskeletal:        General: No tenderness.     Cervical back: Normal range of motion.  Skin:    General: Skin is warm and dry.     Coloration: Skin is pale.     Findings: No erythema or rash.  Neurological:     Mental Status: She is alert and oriented to person, place, and time.    ED Results / Procedures / Treatments   Labs (all labs ordered are listed, but only abnormal results are displayed) Labs Reviewed  CBC WITH DIFFERENTIAL/PLATELET - Abnormal; Notable for the following components:      Result Value   RBC 1.87 (*)    Hemoglobin 4.3 (*)    HCT 15.7 (*)    MCH 23.0 (*)    MCHC 27.4 (*)    RDW 18.3 (*)    nRBC 0.6 (*)    Neutro Abs 7.9 (*)    All other components within normal limits  COMPREHENSIVE METABOLIC PANEL - Abnormal; Notable for the following components:   CO2 21 (*)    Glucose, Bld 123 (*)    BUN 46 (*)    Creatinine, Ser 1.62 (*)    Total Protein 5.6 (*)    Albumin 3.0 (*)    GFR, Estimated 37 (*)    All other components within normal limits  PROTIME-INR - Abnormal; Notable for the following components:   Prothrombin Time 21.9 (*)    INR 1.9 (*)    All other components within normal limits  POC OCCULT BLOOD, ED - Abnormal; Notable for the following components:   Fecal Occult Bld POSITIVE (*)    All other components within normal limits  RESP PANEL BY RT-PCR (FLU A&B, COVID) ARPGX2  BRAIN NATRIURETIC PEPTIDE  I-STAT CHEM 8, ED  TYPE AND SCREEN  PREPARE RBC (CROSSMATCH)  TROPONIN I (HIGH SENSITIVITY)    EKG EKG Interpretation  Date/Time:  Tuesday October 04 2020 08:53:47 EDT Ventricular Rate:  66 PR Interval:  162 QRS Duration: 103 QT Interval:  429 QTC  Calculation: 450 R Axis:   16 Text Interpretation: Sinus rhythm Left atrial enlargement Abnormal T, consider ischemia, lateral leads No significant change since last tracing Confirmed by Gareth Morgan [phone]) on 10/04/2020 9:52:28 AM  Radiology DG Chest Portable 1 View  Result Date: 10/04/2020 CLINICAL DATA:  Shortness of breath. EXAM: PORTABLE CHEST 1 VIEW COMPARISON:  September 11, 2020. FINDINGS: Stable cardiomegaly. Left-sided pacemaker is unchanged in position. Lungs are clear. Bony thorax is unremarkable. IMPRESSION: No active disease. Electronically Signed   By: Marijo Conception M.D.   On: 10/04/2020 09:47    Procedures .Critical Care Performed by: Gareth Morgan, MD Authorized by: Gareth Morgan, MD   Critical care provider statement:    Critical care time (minutes):  45   Critical care was time spent personally by me on the following activities:  Discussions with consultants, evaluation of patient's response to treatment, examination of patient, ordering and performing treatments and interventions, ordering and review of laboratory studies, ordering and review of radiographic studies, pulse oximetry, re-evaluation of patient's condition, obtaining history from patient or surrogate and review of old charts   Medications Ordered in ED Medications  0.9 %  sodium chloride infusion (has no administration in time range)  sodium chloride 0.9 % bolus 1,000 mL (1,000 mLs Intravenous New Bag/Given 10/04/20 0947)  pantoprazole (PROTONIX) 80 mg /NS 100 mL IVPB (80 mg Intravenous New Bag/Given 10/04/20 0947)    ED Course  I have reviewed the triage vital signs and the nursing notes.  Pertinent labs & imaging results that were available during my care of the patient were reviewed by me and considered in my medical decision making (see chart for details).    MDM Rules/Calculators/A&P                           55 year old female with a history of hypertrophic cardiomyopathy status post  ICD, hypertension, type 2 diabetes, chronic diastolic dysfunction, paroxysmal atrial fibrillation on Eliquis, smoking, recent admission for symptomatic anemia September 7 with diagnosis of nonbleeding duodenal ulcers on endoscopy, gastritis, resumed on Eliquis, who presents with concern for lightheadedness and generalized weakness.  Clinical concern on exam for recurrent anemia. Initial blood pressure 97 systolic, improved to 106 without intervention, diastolic bp remain in 26R.  Given IV fluid, bolus of protonix.  Rectal exam without melena however is hemoccult positive.  CXR wiithout active disease or pulmonary edema.  CMP showed AKI with Cr of 1.62 from .6 previously. Called lab to discuss CBC and they report analyzer down--sending istat CHEM8. Consented patient for blood transfusion in anticipation of anemia, however do not feel current vital signs/exam/hx require emergency release blood and would like to obtained type and crossed blood and hgb.  Hemoglobin 4.3.  Ordered 3U pRBCs.  Discussed with Maylon Peppers with  GI.  Given her history and exam I do not suspect severe continued bleeding to necessitate eliquis reversal or continued protonix gtt beyond the bolus 80mg  given.   Will admit to hospitalist for further care with GI consult from [place].   Final Clinical Impression(s) / ED Diagnoses Final diagnoses:  Symptomatic anemia    Rx / DC Orders ED Discharge Orders     None        Gareth Morgan, MD 10/04/20 [phone]

## 2020-10-04 NOTE — Consult Note (Signed)
Referring Provider: Dr. Billy Fischer, EDP Primary Care Physician:  [name], [place] Primary Gastroenterologist:  Dr. Ardis Hughs  Reason for Consultation:  Anemia and heme positive stool  HPI: Alexandra Bell is a 55 y.o. female with medical history significant of hypertrophic cardiomyopathy s/p ICD, essential hypertension, type 2 diabetes mellitus, chronic diastolic congestive heart failure, paroxysmal atrial fibrillation on Eliquis (last dose this morning, 9/27), tobacco use disorder, morbid obesity.  Was just hospitalized earlier this month for anemia (Hgb 4.5 grams) and heme positive stools and underwent EGD and colonoscopy as listed below.  She was discharged home with Hgb of 8.0 grams.  She presented back to [facility] ED now with complaints of fatigue and weakness that have been progressive over the past 2 weeks or so.  Hgb down to 4.3 grams again.  3 units PRBCs have been ordered, first transfusing currently.  INR 1.9.  Colonoscopy 09/13/20:  Non-bleeding internal hemorrhoids, otherwise normal to the TI.  EGD 09/13/20:  Non-bleeding clean based DUs and mild gastritis.  She was on diclofenac when she had her last EGD and continues to take that for arthritic type pains.  She says that she had one maroon stool 2 weeks ago but no further sign of bleeding since then, brown stool.  Has been on pantoprazole 40 mg BID at home.  Past Medical History:  Diagnosis Date   Acute renal failure ([place])    Arthritis    Asthma    Colitis, eosinophilic 19/41/7408   Common bile duct stone    Eosinophilia 14/48/1856   Eosinophilic enteritis 31/4970   Fatty liver 11/2010   GERD (gastroesophageal reflux disease)    HOCM (hypertrophic obstructive cardiomyopathy) ([place]) 12/10/2019   Hx: UTI (urinary tract infection)    Hypertension    Inguinal hernia    Morbid obesity ([place]) 12/10/2019   NSVT (nonsustained ventricular tachycardia) ([place]) 03/10/2020   Obesity, Class III, BMI 40-49.9 (morbid obesity) ([place])     Periumbilical hernia     Past Surgical History:  Procedure Laterality Date   ABDOMINAL HYSTERECTOMY     BIOPSY  09/13/2020   Procedure: BIOPSY;  Surgeon: Jackquline Denmark, MD;  Location[facility] ENDOSCOPY;  Service: Endoscopy;;   BRONCHOSCOPY, LYMPH NODE BX  MAY 2011   CHOLECYSTECTOMY  2001   COLONOSCOPY N/A 09/13/2020   Procedure: COLONOSCOPY;  Surgeon: Jackquline Denmark, MD;  Location: [facility] ENDOSCOPY;  Service: Endoscopy;  Laterality: N/A;   ERCP/SPHINCTEROTOMY/STONE EXTRACTION  2001   ESOPHAGOGASTRODUODENOSCOPY (EGD) WITH PROPOFOL N/A 09/13/2020   Procedure: ESOPHAGOGASTRODUODENOSCOPY (EGD) WITH PROPOFOL;  Surgeon: Jackquline Denmark, MD;  Location: [facility] ENDOSCOPY;  Service: Endoscopy;  Laterality: N/A;   FLEXIBLE SIGMOIDOSCOPY  11/29/2010   Procedure: FLEXIBLE SIGMOIDOSCOPY;  Surgeon: Lafayette Dragon, MD;  Location: [facility] ENDOSCOPY;  Service: Endoscopy;  Laterality: N/A;   ICD IMPLANT N/A 03/25/2020   Procedure: ICD IMPLANT;  Surgeon: Vickie Epley, MD;  Location: [facility] CV LAB;  Service: Cardiovascular;  Laterality: N/A;    Prior to Admission medications   Medication Sig Start Date End Date Taking? Authorizing Provider  acetaminophen (TYLENOL) 500 MG tablet Take 1,500 mg by mouth every 6 (six) hours as needed (For pain).    [provider]  albuterol (VENTOLIN HFA) 108 (90 Base) MCG/ACT inhaler Inhale 2 puffs into the lungs every 6 (six) hours as needed. For asthma 09/07/20   [name], FNP  amLODipine (NORVASC) 10 MG tablet Take 1 tablet (10 mg total) by mouth daily. 07/01/20 09/29/20  Vickie Epley, MD  apixaban (ELIQUIS) 5 MG TABS tablet Take 1 tablet by mouth twice daily 09/09/20   Skeet Latch, MD  carvedilol (COREG) 25 MG tablet Take 1 tablet (25 mg total) by mouth 2 (two) times daily. 03/10/20 09/11/20  Skeet Latch, MD  dapagliflozin propanediol (FARXIGA) 10 MG TABS tablet Take 1 tablet (10 mg total) by mouth daily before breakfast. 09/07/20   [name], FNP  diclofenac  Sodium (VOLTAREN) 1 % GEL APPLY 2 G TOPICALLY FOUR TIMES DAILY. 06/13/20   [name], FNP  Diclofenac Sodium CR 100 MG 24 hr tablet Take 1 tablet (100 mg total) by mouth every morning. 07/28/20   [name], FNP  furosemide (LASIX) 80 MG tablet Take 1 tablet (80 mg total) by mouth daily. 03/10/20   Skeet Latch, MD  gabapentin (NEURONTIN) 300 MG capsule TAKE 1 CAPSULE BY MOUTH THREE TIMES DAILY Patient taking differently: Take 300 mg by mouth 3 (three) times daily. 07/12/20   [name], FNP  lisinopril (ZESTRIL) 40 MG tablet Take 1 tablet (40 mg total) by mouth every morning. 09/09/20   Deberah Pelton, NP  Magnesium 250 MG TABS Take 250 mg by mouth daily at 2 PM.    [provider]  melatonin 5 MG TABS Take 5 mg by mouth daily at 2 PM.    [provider]  metFORMIN (GLUCOPHAGE) 500 MG tablet TAKE 1 TABLET BY MOUTH TWICE DAILY WITH A MEAL 09/16/20   [name], FNP  Multiple Vitamin (MULTIVITAMIN WITH MINERALS) TABS tablet Take 1 tablet by mouth in the morning. One-A-Day Women's Multivitamin    [provider]  pantoprazole (PROTONIX) 40 MG tablet Take 1 tablet (40 mg total) by mouth 2 (two) times daily. 09/14/20 11/13/20  Antonieta Pert, MD    Current Facility-Administered Medications  Medication Dose Route Frequency Provider Last Rate Last Admin   0.9 %  sodium chloride infusion  10 mL/hr Intravenous Once Gareth Morgan, MD       Current Outpatient Medications  Medication Sig Dispense Refill   acetaminophen (TYLENOL) 500 MG tablet Take 1,500 mg by mouth every 6 (six) hours as needed (For pain).     albuterol (VENTOLIN HFA) 108 (90 Base) MCG/ACT inhaler Inhale 2 puffs into the lungs every 6 (six) hours as needed. For asthma 1 each 3   amLODipine (NORVASC) 10 MG tablet Take 1 tablet (10 mg total) by mouth daily. 90 tablet 3   apixaban (ELIQUIS) 5 MG TABS tablet Take 1 tablet by mouth twice daily 180 tablet 1   carvedilol (COREG) 25 MG tablet Take 1 tablet (25 mg  total) by mouth 2 (two) times daily. 180 tablet 3   dapagliflozin propanediol (FARXIGA) 10 MG TABS tablet Take 1 tablet (10 mg total) by mouth daily before breakfast. 30 tablet 3   diclofenac Sodium (VOLTAREN) 1 % GEL APPLY 2 G TOPICALLY FOUR TIMES DAILY. 400 g 0   Diclofenac Sodium CR 100 MG 24 hr tablet Take 1 tablet (100 mg total) by mouth every morning. 90 tablet 1   furosemide (LASIX) 80 MG tablet Take 1 tablet (80 mg total) by mouth daily. 90 tablet 1   gabapentin (NEURONTIN) 300 MG capsule TAKE 1 CAPSULE BY MOUTH THREE TIMES DAILY (Patient taking differently: Take 300 mg by mouth 3 (three) times daily.) 240 capsule 0   lisinopril (ZESTRIL) 40 MG tablet Take 1 tablet (40 mg total) by mouth every morning. 30 tablet 6   Magnesium 250 MG TABS Take 250 mg by mouth  daily at 2 PM.     melatonin 5 MG TABS Take 5 mg by mouth daily at 2 PM.     metFORMIN (GLUCOPHAGE) 500 MG tablet TAKE 1 TABLET BY MOUTH TWICE DAILY WITH A MEAL 180 tablet 2   Multiple Vitamin (MULTIVITAMIN WITH MINERALS) TABS tablet Take 1 tablet by mouth in the morning. One-A-Day Women's Multivitamin     pantoprazole (PROTONIX) 40 MG tablet Take 1 tablet (40 mg total) by mouth 2 (two) times daily. 60 tablet 1    Allergies as of 10/04/2020   (No Known Allergies)    Family History  Problem Relation Age of Onset   Diabetes Mother    Colon cancer Father        ?   Heart attack Father    Diabetes Sister     Social History   Socioeconomic History   Marital status: Married    Spouse name: Not on file   Number of children: 2   Years of education: Not on file   Highest education level: Not on file  Occupational History   Occupation: Food prep, Metallurgist: UNEMPLOYED  Tobacco Use   Smoking status: Every Day    Packs/day: 0.50    Years: 30.00    Pack years: 15.00    Types: Cigarettes   Smokeless tobacco: Never   Tobacco comments:    she is less than a PPD - states down to 6 cigarettes a day - 10/14  Vaping  Use   Vaping Use: Never used  Substance and Sexual Activity   Alcohol use: No   Drug use: No   Sexual activity: Not on file  Other Topics Concern   Not on file  Social History Narrative   Not on file   Social Determinants of Health   Financial Resource Strain: Not on file  Food Insecurity: Not on file  Transportation Needs: Not on file  Physical Activity: Not on file  Stress: Not on file  Social Connections: Not on file  Intimate Partner Violence: Not on file    Review of Systems: ROS is O/W negative except as mentioned in HPI.  Physical Exam: Vital signs in last 24 hours: Temp:  [98.2 F (36.8 C)] 98.2 F (36.8 C) (09/27 0856) Pulse Rate:  [64-75] 64 (09/27 0945) Resp:  [15-21] 21 (09/27 0945) BP: (97-119)/(41-46) 119/41 (09/27 0945) SpO2:  [98 %-99 %] 98 % (09/27 0945) Weight:  [476 kg] 125 kg (09/27 0858)   General:  Alert, Well-developed, well-nourished, pleasant and cooperative in NAD Head:  Normocephalic and atraumatic. Eyes:  Sclera clear, no icterus.  Conjunctiva pale. Ears:  Normal auditory acuity. Mouth:  No deformity or lesions.   Lungs:  Clear throughout to auscultation.  No wheezes, crackles, or rhonchi.  Heart:  Regular rate and rhythm; SEM noted. Abdomen:  Soft, non-distended.  BS present.  Non-tender.  Rectal:  Brown, heme positive per EDP.  Msk:  Symmetrical without gross deformities. Pulses:  Normal pulses noted. Extremities:  2-3+ pitting edema in B/L LEs Neurologic:  Alert and oriented x 4;  grossly normal neurologically. Skin:  Intact without significant lesions or rashes. Psych:  Alert and cooperative. Normal mood and affect.  Lab Results: Recent Labs    10/04/20 0846  WBC 10.0  HGB 4.3*  HCT 15.7*  PLT 277   BMET Recent Labs    10/04/20 0846  NA 138  K 4.3  CL 107  CO2 21*  GLUCOSE 123*  BUN  46*  CREATININE 1.62*  CALCIUM 9.5   LFT Recent Labs    10/04/20 0846  PROT 5.6*  ALBUMIN 3.0*  AST 17  ALT 11  ALKPHOS 45   BILITOT 0.5   PT/INR Recent Labs    10/04/20 0846  LABPROT 21.9*  INR 1.9*    Studies/Results: DG Chest Portable 1 View  Result Date: 10/04/2020 CLINICAL DATA:  Shortness of breath. EXAM: PORTABLE CHEST 1 VIEW COMPARISON:  September 11, 2020. FINDINGS: Stable cardiomegaly. Left-sided pacemaker is unchanged in position. Lungs are clear. Bony thorax is unremarkable. IMPRESSION: No active disease. Electronically Signed   By: Marijo Conception M.D.   On: 10/04/2020 09:47    IMPRESSION:  *GI bleed:  Suspect upper source (likely subacute bleed) with maroon stool a couple of weeks ago and now brown but heme positive and presence of duodenal ulcers on EGD earlier this month and long-standing NSAID use with diclofenac.  BUN slightly elevated at 24.  *ALBA/iron deficiency anemia:  Secondary to above.  Hgb 4.3 grams on admission compared to 8.0 grams on 9/7.  Receiving 3 units PRBCs.  Was on pantoprazole 40 mg BID at home. *PAF on Eliquis with last dose this AM, 9/27. *NSAID use as above *Coagulopathy with INR 1.9.  ? Due to Eliquis.  PLAN: *Will plan for EGD tomorrow morning, 9/28. *Has been given PPI bolus.  Will start IV BID for now. *Monitor Hgb.   Laban Emperor. Smriti Barkow  10/04/2020, 10:50 AM

## 2020-10-04 NOTE — ED Notes (Signed)
Patient signed the consent form and at the bedside.

## 2020-10-05 ENCOUNTER — Observation Stay (HOSPITAL_COMMUNITY): Payer: 59 | Admitting: Anesthesiology

## 2020-10-05 ENCOUNTER — Encounter (HOSPITAL_COMMUNITY)
Admission: EM | Disposition: A | Discharge status: Home / Self Care | Payer: Self-pay | Source: Home / Self Care | Attending: Internal Medicine

## 2020-10-05 ENCOUNTER — Encounter (HOSPITAL_COMMUNITY): Payer: Self-pay | Admitting: Internal Medicine

## 2020-10-05 DIAGNOSIS — Z8249 Family history of ischemic heart disease and other diseases of the circulatory system: Secondary | ICD-10-CM | POA: Diagnosis not present

## 2020-10-05 DIAGNOSIS — K2970 Gastritis, unspecified, without bleeding: Secondary | ICD-10-CM

## 2020-10-05 DIAGNOSIS — K2981 Duodenitis with bleeding: Secondary | ICD-10-CM

## 2020-10-05 DIAGNOSIS — M170 Bilateral primary osteoarthritis of knee: Secondary | ICD-10-CM | POA: Diagnosis present

## 2020-10-05 DIAGNOSIS — Z833 Family history of diabetes mellitus: Secondary | ICD-10-CM | POA: Diagnosis not present

## 2020-10-05 DIAGNOSIS — D649 Anemia, unspecified: Secondary | ICD-10-CM | POA: Diagnosis present

## 2020-10-05 DIAGNOSIS — Z20822 Contact with and (suspected) exposure to covid-19: Secondary | ICD-10-CM | POA: Diagnosis present

## 2020-10-05 DIAGNOSIS — I421 Obstructive hypertrophic cardiomyopathy: Secondary | ICD-10-CM | POA: Diagnosis present

## 2020-10-05 DIAGNOSIS — I5032 Chronic diastolic (congestive) heart failure: Secondary | ICD-10-CM | POA: Diagnosis present

## 2020-10-05 DIAGNOSIS — J45909 Unspecified asthma, uncomplicated: Secondary | ICD-10-CM | POA: Diagnosis present

## 2020-10-05 DIAGNOSIS — I472 Ventricular tachycardia: Secondary | ICD-10-CM | POA: Diagnosis present

## 2020-10-05 DIAGNOSIS — Z7901 Long term (current) use of anticoagulants: Secondary | ICD-10-CM | POA: Diagnosis not present

## 2020-10-05 DIAGNOSIS — D509 Iron deficiency anemia, unspecified: Secondary | ICD-10-CM | POA: Diagnosis present

## 2020-10-05 DIAGNOSIS — K2971 Gastritis, unspecified, with bleeding: Secondary | ICD-10-CM | POA: Diagnosis present

## 2020-10-05 DIAGNOSIS — D62 Acute posthemorrhagic anemia: Secondary | ICD-10-CM

## 2020-10-05 DIAGNOSIS — I480 Paroxysmal atrial fibrillation: Secondary | ICD-10-CM | POA: Diagnosis present

## 2020-10-05 DIAGNOSIS — Z6841 Body Mass Index (BMI) 40.0 and over, adult: Secondary | ICD-10-CM | POA: Diagnosis not present

## 2020-10-05 DIAGNOSIS — N179 Acute kidney failure, unspecified: Secondary | ICD-10-CM | POA: Diagnosis present

## 2020-10-05 DIAGNOSIS — K264 Chronic or unspecified duodenal ulcer with hemorrhage: Secondary | ICD-10-CM | POA: Diagnosis present

## 2020-10-05 DIAGNOSIS — K219 Gastro-esophageal reflux disease without esophagitis: Secondary | ICD-10-CM | POA: Diagnosis present

## 2020-10-05 DIAGNOSIS — K2990 Gastroduodenitis, unspecified, without bleeding: Secondary | ICD-10-CM | POA: Diagnosis not present

## 2020-10-05 DIAGNOSIS — E1140 Type 2 diabetes mellitus with diabetic neuropathy, unspecified: Secondary | ICD-10-CM | POA: Diagnosis present

## 2020-10-05 DIAGNOSIS — Z79899 Other long term (current) drug therapy: Secondary | ICD-10-CM | POA: Diagnosis not present

## 2020-10-05 DIAGNOSIS — I110 Hypertensive heart disease with heart failure: Secondary | ICD-10-CM | POA: Diagnosis present

## 2020-10-05 DIAGNOSIS — F17210 Nicotine dependence, cigarettes, uncomplicated: Secondary | ICD-10-CM | POA: Diagnosis present

## 2020-10-05 DIAGNOSIS — K269 Duodenal ulcer, unspecified as acute or chronic, without hemorrhage or perforation: Secondary | ICD-10-CM | POA: Diagnosis not present

## 2020-10-05 DIAGNOSIS — Z95810 Presence of automatic (implantable) cardiac defibrillator: Secondary | ICD-10-CM | POA: Diagnosis not present

## 2020-10-05 HISTORY — PX: ESOPHAGOGASTRODUODENOSCOPY (EGD) WITH PROPOFOL: SHX5813

## 2020-10-05 HISTORY — PX: BIOPSY: SHX5522

## 2020-10-05 HISTORY — PX: HEMOSTASIS CLIP PLACEMENT: SHX6857

## 2020-10-05 LAB — BASIC METABOLIC PANEL
Anion gap: 7 (ref 5–15)
BUN: 38 mg/dL — ABNORMAL HIGH (ref 6–20)
CO2: 26 mmol/L (ref 22–32)
Calcium: 9.5 mg/dL (ref 8.9–10.3)
Chloride: 108 mmol/L (ref 98–111)
Creatinine, Ser: 1.15 mg/dL — ABNORMAL HIGH (ref 0.44–1.00)
GFR, Estimated: 56 mL/min — ABNORMAL LOW (ref >60)
Glucose, Bld: 101 mg/dL — ABNORMAL HIGH (ref 70–99)
Potassium: 3.8 mmol/L (ref 3.5–5.1)
Sodium: 141 mmol/L (ref 135–145)

## 2020-10-05 LAB — TYPE AND SCREEN
ABO/RH(D): A POS
Antibody Screen: NEGATIVE
Unit division: 0
Unit division: 0
Unit division: 0

## 2020-10-05 LAB — GLUCOSE, CAPILLARY
Glucose-Capillary: 112 mg/dL — ABNORMAL HIGH (ref 70–99)
Glucose-Capillary: 118 mg/dL — ABNORMAL HIGH (ref 70–99)
Glucose-Capillary: 127 mg/dL — ABNORMAL HIGH (ref 70–99)

## 2020-10-05 LAB — BPAM RBC
Blood Product Expiration Date: 202210272359
Blood Product Expiration Date: 202210272359
Blood Product Expiration Date: 202210272359
ISSUE DATE / TIME: 202209271039
ISSUE DATE / TIME: 202209271303
ISSUE DATE / TIME: 202209271458
Unit Type and Rh: 6200
Unit Type and Rh: 6200
Unit Type and Rh: 6200

## 2020-10-05 LAB — HEMOGLOBIN AND HEMATOCRIT, BLOOD
HCT: 24.3 % — ABNORMAL LOW (ref 36.0–46.0)
Hemoglobin: 7.4 g/dL — ABNORMAL LOW (ref 12.0–15.0)

## 2020-10-05 LAB — SURGICAL PATHOLOGY

## 2020-10-05 SURGERY — ESOPHAGOGASTRODUODENOSCOPY (EGD) WITH PROPOFOL
Anesthesia: Monitor Anesthesia Care

## 2020-10-05 MED ORDER — FUROSEMIDE 10 MG/ML IJ SOLN
40.0000 mg | Freq: Once | INTRAMUSCULAR | Status: AC
  Administered 2020-10-05: 40 mg via INTRAVENOUS
  Filled 2020-10-05: qty 4

## 2020-10-05 MED ORDER — POTASSIUM CHLORIDE CRYS ER 20 MEQ PO TBCR
20.0000 meq | EXTENDED_RELEASE_TABLET | Freq: Once | ORAL | Status: AC
  Administered 2020-10-05: 20 meq via ORAL
  Filled 2020-10-05: qty 1

## 2020-10-05 MED ORDER — SODIUM CHLORIDE 0.9 % IV SOLN: INTRAVENOUS | Status: DC

## 2020-10-05 MED ORDER — LACTATED RINGERS IV SOLN: INTRAVENOUS | Status: DC | PRN

## 2020-10-05 MED ORDER — CARVEDILOL 12.5 MG PO TABS
12.5000 mg | ORAL_TABLET | Freq: Two times a day (BID) | ORAL | Status: DC
  Administered 2020-10-05 – 2020-10-06 (×2): 12.5 mg via ORAL
  Filled 2020-10-05 (×2): qty 1

## 2020-10-05 MED ORDER — GLUCAGON HCL RDNA (DIAGNOSTIC) 1 MG IJ SOLR
INTRAMUSCULAR | Status: DC | PRN
  Administered 2020-10-05: .5 mg via INTRAVENOUS

## 2020-10-05 MED ORDER — PROPOFOL 500 MG/50ML IV EMUL
INTRAVENOUS | Status: DC | PRN
  Administered 2020-10-05: 75 ug/kg/min via INTRAVENOUS

## 2020-10-05 MED ORDER — GABAPENTIN 300 MG PO CAPS
300.0000 mg | ORAL_CAPSULE | Freq: Two times a day (BID) | ORAL | Status: DC
  Administered 2020-10-05 – 2020-10-07 (×4): 300 mg via ORAL
  Filled 2020-10-05 (×4): qty 1

## 2020-10-05 MED ORDER — ALBUTEROL SULFATE (2.5 MG/3ML) 0.083% IN NEBU
INHALATION_SOLUTION | RESPIRATORY_TRACT | Status: AC
  Filled 2020-10-05: qty 3

## 2020-10-05 MED ORDER — PROPOFOL 10 MG/ML IV BOLUS
INTRAVENOUS | Status: DC | PRN
  Administered 2020-10-05: 40 mg via INTRAVENOUS

## 2020-10-05 MED ORDER — GLUCAGON HCL RDNA (DIAGNOSTIC) 1 MG IJ SOLR
INTRAMUSCULAR | Status: AC
  Filled 2020-10-05: qty 1

## 2020-10-05 SURGICAL SUPPLY — 15 items

## 2020-10-05 NOTE — ED Notes (Signed)
This RN to help pt ambulate to restroom due to pt stating she is unable to use purewick - pt appears a little unsteady on feet - pt used bedside commode with assistance and now back in bed.

## 2020-10-05 NOTE — Progress Notes (Addendum)
PROGRESS NOTE    Alexandra Bell  QZE:[id number] DOB: July 27, 1965 DOA: 10/04/2020 PCP: [name], FNP  Brief Narrative:Alexandra Bell is a 56 y.o. female with history h/o GERD, hypertension, morbid obesity,HOCM, NSVT, paroxysmal A. fib on Eliquis and s/p AICD/pacemaker, history of CBD stones s/p cholecystectomy, chronic arthritis who was recently admitted to this [facility] (9/4-9/7) for acute blood loss anemia in the setting of Eliquis use with hemoglobin 4.9->transfused 3 units of blood at that time and seen by GI->underwent EGD/colonoscopy showing nonbleeding duodenal ulcers, mild gastritis and nonbleeding internal hemorrhoids->discharged home with hemoglobin improved/stabilized at 7.6-8.0.  On 9/27 she returned to the hospital with generalized weakness, extreme fatigue and lightheadedness X 2 days, history of rib noticing maroon stools a week ago. -History of daily diclofenac use for arthritis in both knees  -In the ED she was hypotensive, work-up noted hemoglobin of 4.3  Assessment & Plan:   Acute blood loss anemia Upper GI bleed/duodenal ulcer/gastritis -Admitted with severe symptomatic anemia, hemoglobin of 4.3 -Transfused 3 units of PRBC  -History of daily NSAID use -Recent EGD noted mild gastritis with nonbleeding duodenal ulcers and internal hemorrhoids -EGD today noted gastritis and duodenal ulcer which was clipped, Eliquis on hold X1 week -Continue PPI -Hemoglobin up to 7.4 this morning, continue to monitor -Discussed with patient, importance of avoiding NSAIDs, follow-up H. Pylori  Hypertension -Resume carvedilol today  Paroxysmal A. Fib -Holding Eliquis x1 week, for severe upper GI bleed, duodenal ulcer -Resume carvedilol  AKI -Prerenal from blood loss, hold ACE/diuretics -IV Lasix x1 today   Diabetes mellitus type 2 with neuropathy:  -Metformin, oral hypoglycemics on hold  -CBGs are stable, sliding scale insulin  HOCM, NSVT: S/p AICD.   -Denies history  of pulmonary edema but reports taking Lasix for leg swellings. -IV Lasix x1 today   History of asthma: Currently stable, no wheezing.  Resume home inhaler therapy.   History of chronic arthritis, knee pains: Advised patient to avoid NSAIDs with history of duodenal ulcers and anticoagulant use..  Will utilize topical cream for now, tramadol as needed   Tobacco use: Reports cutting down recently and a pack now lasts for 3 days.  Encourage patient to quit given underlying heart disease/arrhythmias.  She acknowledges the need to quit but motivation questionable.    Morbid obesity class III (BMI 40-49.9): Advised diet compliance, follow-up PCP for long-term goals.   DVT prophylaxis: SCDs, anticoagulants on hold due to GIB   Code Status: Full code. Family Communication: Discussed patient in detail, no family at bedside Disposition Plan:  Status is: Observation  The patient will require care spanning > 2 midnights and should be moved to inpatient because: Inpatient level of care appropriate due to severity of illness  Dispo: The patient is from: Home              Anticipated d/c is to: Home              Patient currently is not medically stable to d/c.   Difficult to place patient No   Consultants:  Gastroenterology  Procedures: EGD  impression:               - Normal esophagus.                           - Scattered gastritis with scant adherent heme but  no active bleeding or ulcerations. Biopsied.                           - Duodenitis with 1 ulcer with hemorrhage. Clips                            (MR conditional) were placed x 3.                           - Normal second portion of the duodenum. Moderate Sedation:      N/A Recommendation:           - Return patient to hospital ward for ongoing care.                           - Advance diet as tolerated.                           - Continue present medications.                           - BID PPI.                            - No aspirin, ibuprofen, naproxen, or other                            non-steroidal anti-inflammatory drugs. No                            diclofenac.                           - Given rebleeding and endoscopic therapy today                            would recommending holding Eliquis x 7 days if                            possible.                           - Await pathology results to exclude H. Pylori,                            though NSAIDs at least contributing to                            gastroduodenitis and ulceration.  Antimicrobials:    Subjective: -  Objective: Vitals:   10/05/20 0925 10/05/20 0934 10/05/20 0944 10/05/20 1006  BP: (!) 110/33 (!) 130/28 (!) 142/27 (!) 137/55  Pulse: 69 96 67 62  Resp: 20 18 16 17   Temp: 97.8 F (36.6 C)   97.8 F (36.6 C)  TempSrc: Temporal   Oral  SpO2: 95% 95% 96% 98%  Weight:      Height:        Intake/Output Summary (Last 24 hours) at 10/05/2020 1424 Last  data filed at 10/05/2020 1228 Gross per 24 hour  Intake 2000 ml  Output 2000 ml  Net 0 ml   Filed Weights   10/04/20 0858 10/05/20 0802  Weight: 125 kg 125 kg    Examination:  General exam: Pleasant obese female laying in bed, AAOx3, no distress HEENT: No JVD CVS: S1-S2, regular rate rhythm Lungs: Clear bilaterally Abdomen: Soft, nontender, obese, nondistended, bowel sounds present Extremities: 1+ edema bilaterally Skin: No rash on exposed skin   Data Reviewed:   CBC: Recent Labs  Lab 10/04/20 0846 10/04/20 1646 10/05/20 0411  WBC 10.0  --   --   NEUTROABS 7.9*  --   --   HGB 4.3* 7.5* 7.4*  HCT 15.7* 24.9* 24.3*  MCV 84.0  --   --   PLT 277  --   --    Basic Metabolic Panel: Recent Labs  Lab 10/04/20 0846 10/05/20 0411  NA 138 141  K 4.3 3.8  CL 107 108  CO2 21* 26  GLUCOSE 123* 101*  BUN 46* 38*  CREATININE 1.62* 1.15*  CALCIUM 9.5 9.5   GFR: Estimated Creatinine Clearance: 73.8 mL/min (A) (by C-G formula based on  SCr of 1.15 mg/dL (H)). Liver Function Tests: Recent Labs  Lab 10/04/20 0846  AST 17  ALT 11  ALKPHOS 45  BILITOT 0.5  PROT 5.6*  ALBUMIN 3.0*   No results for input(s): LIPASE, AMYLASE in the last 168 hours. No results for input(s): AMMONIA in the last 168 hours. Coagulation Profile: Recent Labs  Lab 10/04/20 0846  INR 1.9*   Cardiac Enzymes: No results for input(s): CKTOTAL, CKMB, CKMBINDEX, TROPONINI in the last 168 hours. BNP (last 3 results) No results for input(s): PROBNP in the last 8760 hours. HbA1C: No results for input(s): HGBA1C in the last 72 hours. CBG: Recent Labs  Lab 10/04/20 1658 10/04/20 2236 10/05/20 1116  GLUCAP 93 97 112*   Lipid Profile: No results for input(s): CHOL, HDL, LDLCALC, TRIG, CHOLHDL, LDLDIRECT in the last 72 hours. Thyroid Function Tests: No results for input(s): TSH, T4TOTAL, FREET4, T3FREE, THYROIDAB in the last 72 hours. Anemia Panel: No results for input(s): VITAMINB12, FOLATE, FERRITIN, TIBC, IRON, RETICCTPCT in the last 72 hours. Urine analysis:    Component Value Date/Time   COLORURINE YELLOW 11/28/2010 1033   APPEARANCEUR HAZY (A) 11/28/2010 1033   LABSPEC 1.021 11/28/2010 1033   PHURINE 5.5 11/28/2010 1033   GLUCOSEU NEGATIVE 11/28/2010 1033   HGBUR MODERATE (A) 11/28/2010 1033   BILIRUBINUR negative 05/07/2019 1645   KETONESUR NEGATIVE 11/28/2010 1033   PROTEINUR Negative 05/07/2019 1645   PROTEINUR 30 (A) 11/28/2010 1033   UROBILINOGEN 0.2 05/07/2019 1645   UROBILINOGEN 0.2 11/28/2010 1033   NITRITE negative 05/07/2019 1645   NITRITE NEGATIVE 11/28/2010 1033   LEUKOCYTESUR Negative 05/07/2019 1645   Sepsis Labs: @LABRCNTIP (procalcitonin:4,lacticidven:4)  ) Recent Results (from the past 240 hour(s))  Resp Panel by RT-PCR (Flu A&B, Covid) Nasopharyngeal Swab     Status: None   Collection Time: 10/04/20  9:41 AM   Specimen: Nasopharyngeal Swab; Nasopharyngeal(NP) swabs in vial transport medium  Result Value  Ref Range Status   SARS Coronavirus 2 by RT PCR NEGATIVE NEGATIVE Final    Comment: (NOTE) SARS-CoV-2 target nucleic acids are NOT DETECTED.  The SARS-CoV-2 RNA is generally detectable in upper respiratory specimens during the acute phase of infection. The lowest concentration of SARS-CoV-2 viral copies this assay can detect is 138 copies/mL. A negative result does not preclude SARS-Cov-2  infection and should not be used as the sole basis for treatment or other patient management decisions. A negative result may occur with  improper specimen collection/handling, submission of specimen other than nasopharyngeal swab, presence of viral mutation(s) within the areas targeted by this assay, and inadequate number of viral copies(<138 copies/mL). A negative result must be combined with clinical observations, patient history, and epidemiological information. The expected result is Negative.  Fact Sheet for Patients:  EntrepreneurPulse.com.au  Fact Sheet for Healthcare Providers:  IncredibleEmployment.be  This test is no t yet approved or cleared by the [place] FDA and  has been authorized for detection and/or diagnosis of SARS-CoV-2 by FDA under an Emergency Use Authorization (EUA). This EUA will remain  in effect (meaning this test can be used) for the duration of the COVID-19 declaration under Section 564(b)(1) of the Act, 21 U.S.C.section 360bbb-3(b)(1), unless the authorization is terminated  or revoked sooner.       Influenza A by PCR NEGATIVE NEGATIVE Final   Influenza B by PCR NEGATIVE NEGATIVE Final    Comment: (NOTE) The Xpert Xpress SARS-CoV-2/FLU/RSV plus assay is intended as an aid in the diagnosis of influenza from Nasopharyngeal swab specimens and should not be used as a sole basis for treatment. Nasal washings and aspirates are unacceptable for Xpert Xpress SARS-CoV-2/FLU/RSV testing.  Fact Sheet for  Patients: EntrepreneurPulse.com.au  Fact Sheet for Healthcare Providers: IncredibleEmployment.be  This test is not yet approved or cleared by the [place] FDA and has been authorized for detection and/or diagnosis of SARS-CoV-2 by FDA under an Emergency Use Authorization (EUA). This EUA will remain in effect (meaning this test can be used) for the duration of the COVID-19 declaration under Section 564(b)(1) of the Act, 21 U.S.C. section 360bbb-3(b)(1), unless the authorization is terminated or revoked.  Performed at [facility], [place] [address], [place], [place] [address]          Radiology Studies: CT ABDOMEN PELVIS WO CONTRAST  Result Date: 10/04/2020 CLINICAL DATA:  GI bleed, recurrent anemia on Eliquis EXAM: CT ABDOMEN AND PELVIS WITHOUT CONTRAST TECHNIQUE: Multidetector CT imaging of the abdomen and pelvis was performed following the standard protocol without IV contrast. COMPARISON:  CT abdomen/pelvis 11/28/2010 FINDINGS: Lower chest: There is mosaic attenuation in the lung bases likely reflecting air trapping. The heart is enlarged. Cardiac device leads are partially imaged. There is trace pericardial fluid. Hepatobiliary: The liver is unremarkable, within the confines of noncontrast technique. The gallbladder is surgically absent. There is no intra or extrahepatic biliary ductal dilatation. Pancreas: Unremarkable. Spleen: Unremarkable. Adrenals/Urinary Tract: There is an approximately 1.6 cm left adrenal nodule measuring 1.6 Hounsfield units consistent with a benign adenoma. The right adrenal is unremarkable. The kidneys are unremarkable. There are no focal lesions, within the confines of noncontrast technique. There are no stones. There is no hydronephrosis or hydroureter. The bladder is unremarkable. Stomach/Bowel: The stomach is unremarkable. There is no evidence of bowel obstruction. There is no abnormal bowel wall thickening or  inflammatory change. There is no definite blood in the bowel lumen, though this is suboptimally evaluated given single-phase noncontrast technique. Vascular/Lymphatic: There is scattered calcified atherosclerotic plaque in the nonaneurysmal abdominal aorta. There is no abdominal or pelvic lymphadenopathy. Reproductive: The uterus is surgically absent. There is no adnexal mass. Other: There is small volume simple fluid in the pelvis, nonspecific. There is no free intraperitoneal air. There is no evidence of retroperitoneal hematoma. There is a small fat containing umbilical hernia, not significantly  changed since 2012. There is a small fat containing right inguinal hernia. Musculoskeletal: There is grade 1 anterolisthesis of L4 on L5 and L5 on S1. 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 There is no acute osseous abnormality or aggressive osseous lesion. IMPRESSION: 1. No definite evidence of GI bleed, suboptimally evaluated given single-phase noncontrast technique. 2. No evidence of retroperitoneal hematoma. 3. Small volume simple fluid in the pelvis, nonspecific. 4. Benign left adrenal adenoma. 5. Cardiomegaly with trace pericardial effusion. 6. Mosaic attenuation in the lung bases likely reflecting air trapping. Aortic Atherosclerosis (ICD10-I70.0). Electronically Signed   By: Valetta Mole M.D.   On: 10/04/2020 12:29   DG Chest Portable 1 View  Result Date: 10/04/2020 CLINICAL DATA:  Shortness of breath. EXAM: PORTABLE CHEST 1 VIEW COMPARISON:  September 11, 2020. FINDINGS: Stable cardiomegaly. Left-sided pacemaker is unchanged in position. Lungs are clear. Bony thorax is unremarkable. IMPRESSION: No active disease. Electronically Signed   By: Marijo Conception M.D.   On: 10/04/2020 09:47        Scheduled Meds:  carvedilol  3.125 mg Oral BID   gabapentin  300 mg Oral TID   insulin aspart  0-15 Units Subcutaneous TID WC   magnesium oxide  200 mg Oral Q1400   melatonin  5 mg Oral QHS    multivitamin with minerals  1 tablet Oral q AM   pantoprazole (PROTONIX) IV  40 mg Intravenous Q12H   Continuous Infusions:   LOS: 0 days    Time spent: 47min    Domenic Polite, MD [facility]   10/05/2020, 2:24 PM

## 2020-10-05 NOTE — ED Notes (Signed)
Dr. Cyd Silence asked if pt okay to have sips with meds to take night time meds - awaiting response at this time

## 2020-10-05 NOTE — Interval H&P Note (Signed)
History and Physical Interval Note: For EGD this morning to eval recurrent anemia in setting of recent PUD S/P 3 u pRBC The nature of the procedure, as well as the risks, benefits, and alternatives were carefully and thoroughly reviewed with the patient. Ample time for discussion and questions allowed. The patient understood, was satisfied, and agreed to proceed.   CBC Latest Ref Rng & Units 10/05/2020 10/04/2020 10/04/2020  WBC 4.0 - 10.5 K/uL - - 10.0  Hemoglobin 12.0 - 15.0 g/dL 7.4(L) 7.5(L) 4.3(LL)  Hematocrit 36.0 - 46.0 % 24.3(L) 24.9(L) 15.7(L)  Platelets 150 - 400 K/uL - - 277     10/05/2020 8:35 AM  Alexandra Bell  has presented today for surgery, with the diagnosis of Anemia and duodenal ulcers.  The various methods of treatment have been discussed with the patient and family. After consideration of risks, benefits and other options for treatment, the patient has consented to  Procedure(s): ESOPHAGOGASTRODUODENOSCOPY (EGD) WITH PROPOFOL (N/A) as a surgical intervention.  The patient's history has been reviewed, patient examined, no change in status, stable for surgery.  I have reviewed the patient's chart and labs.  Questions were answered to the patient's satisfaction.     [name] Marty Sadlowski

## 2020-10-05 NOTE — Transfer of Care (Signed)
Immediate Anesthesia Transfer of Care Note  Patient: Alexandra Bell  Procedure(s) Performed: ESOPHAGOGASTRODUODENOSCOPY (EGD) WITH PROPOFOL HEMOSTASIS CLIP PLACEMENT BIOPSY  Patient Location: PACU and Endoscopy Unit  Anesthesia Type:MAC  Level of Consciousness: awake and patient cooperative  Airway & Oxygen Therapy: Patient Spontanous Breathing and Patient connected to nasal cannula oxygen  Post-op Assessment: Report given to RN and Post -op Vital signs reviewed and stable  Post vital signs: Reviewed and stable  Last Vitals:  Vitals Value Taken Time  BP 110/33 10/05/20 0924  Temp    Pulse 118 10/05/20 0925  Resp 18 10/05/20 0925  SpO2 97 % 10/05/20 0925  Vitals shown include unvalidated device data.  Last Pain:  Vitals:   10/05/20 0802  TempSrc: Temporal  PainSc: 0-No pain         Complications: No notable events documented.

## 2020-10-05 NOTE — Anesthesia Procedure Notes (Signed)
Procedure Name: MAC Date/Time: 10/05/2020 8:50 AM Performed by: Lowella Dell, CRNA Pre-anesthesia Checklist: Patient identified, Emergency Drugs available, Suction available, Patient being monitored and Timeout performed Patient Re-evaluated:Patient Re-evaluated prior to induction Oxygen Delivery Method: Nasal cannula Placement Confirmation: positive ETCO2 Dental Injury: Teeth and Oropharynx as per pre-operative assessment

## 2020-10-05 NOTE — Anesthesia Postprocedure Evaluation (Signed)
Anesthesia Post Note  Patient: Alexandra Bell  Procedure(s) Performed: ESOPHAGOGASTRODUODENOSCOPY (EGD) WITH PROPOFOL HEMOSTASIS CLIP PLACEMENT BIOPSY     Patient location during evaluation: PACU Anesthesia Type: MAC Level of consciousness: awake and alert Pain management: pain level controlled Vital Signs Assessment: post-procedure vital signs reviewed and stable Respiratory status: spontaneous breathing, nonlabored ventilation and respiratory function stable Cardiovascular status: stable and blood pressure returned to baseline Anesthetic complications: no   No notable events documented.  Last Vitals:  Vitals:   10/05/20 0944 10/05/20 1006  BP: (!) 142/27 (!) 137/55  Pulse: 67 62  Resp: 16 17  Temp:  36.6 C  SpO2: 96% 98%    Last Pain:  Vitals:   10/05/20 1006  TempSrc: Oral  PainSc: 0-No pain                 Audry Pili

## 2020-10-05 NOTE — Plan of Care (Signed)

## 2020-10-05 NOTE — ED Notes (Signed)
Pt transported to Endoscopy, expected to return to ED afterwards.

## 2020-10-05 NOTE — Op Note (Signed)
[facility] Patient Name: Alexandra Bell Procedure Date : 10/05/2020 MRN: [id number] Attending MD: Jerene Bears , MD Date of Birth: Mar 09, 1965 CSN: [id number] Age: 55 Admit Type: Inpatient Procedure:                Upper GI endoscopy Indications:              Acute post hemorrhagic anemia, recent diagnosis of                            duodenal ulcers on 09/13/20, patient on Eliquis and                            NSAID Providers:                [name]. Hilarie Fredrickson, MD, Particia Nearing, RN, Benetta Spar, Technician Referring MD:             [facility] Medicines:                Monitored Anesthesia Care Complications:            No immediate complications. Estimated Blood Loss:     Estimated blood loss was minimal. Procedure:                Pre-Anesthesia Assessment:                           - Prior to the procedure, a History and Physical                            was performed, and patient medications and                            allergies were reviewed. The patient's tolerance of                            previous anesthesia was also reviewed. The risks                            and benefits of the procedure and the sedation                            options and risks were discussed with the patient.                            All questions were answered, and informed consent                            was obtained. Prior Anticoagulants: The patient has                            taken Eliquis (apixaban), last dose was 1 day prior  to procedure. ASA Grade Assessment: III - A patient                            with severe systemic disease. After reviewing the                            risks and benefits, the patient was deemed in                            satisfactory condition to undergo the procedure.                           After obtaining informed consent, the endoscope was                             passed under direct vision. Throughout the                            procedure, the patient's blood pressure, pulse, and                            oxygen saturations were monitored continuously. The                            [id number]-[id number] ([id number]) Olympus endoscope was introduced                            through the mouth, and advanced to the second part                            of duodenum. The upper GI endoscopy was                            accomplished without difficulty. The patient                            tolerated the procedure well. Scope In: Scope Out: Findings:      The examined esophagus was normal.      Scattered mild inflammation characterized by erythema and friability was       found in the gastric fundus, in the gastric body and in the gastric       antrum. There was scant adherent heme but no gastric ulcerations.       Biopsies were taken with a cold forceps for histology and Helicobacter       pylori testing (gastric body and antrum).      Severe inflammation with characterized by congestion (edema), erythema,       granularity and 1 deep ulceration was found in the duodenal bulb. The       ulceration was oozing actively. To stop active bleeding, three       hemostatic clips were successfully placed (MR conditional). There was no       bleeding at the end of the maneuver.      The second portion of the duodenum was normal. Impression:               -  Normal esophagus.                           - Scattered gastritis with scant adherent heme but                            no active bleeding or ulcerations. Biopsied.                           - Duodenitis with 1 ulcer with hemorrhage. Clips                            (MR conditional) were placed x 3.                           - Normal second portion of the duodenum. Moderate Sedation:      N/A Recommendation:           - Return patient to hospital ward for ongoing care.                           - Advance diet  as tolerated.                           - Continue present medications.                           - BID PPI.                           - No aspirin, ibuprofen, naproxen, or other                            non-steroidal anti-inflammatory drugs. No                            diclofenac.                           - Given rebleeding and endoscopic therapy today                            would recommending holding Eliquis x 7 days if                            possible.                           - Await pathology results to exclude H. Pylori,                            though NSAIDs at least contributing to                            gastroduodenitis and ulceration. Procedure Code(s):        --- Professional ---  43255, 59, Esophagogastroduodenoscopy, flexible,                            transoral; with control of bleeding, any method                           43239, Esophagogastroduodenoscopy, flexible,                            transoral; with biopsy, single or multiple Diagnosis Code(s):        --- Professional ---                           K29.70, Gastritis, unspecified, without bleeding                           K29.81, Duodenitis with bleeding                           D62, Acute posthemorrhagic anemia CPT copyright 2019 [employer]. All rights reserved. The codes documented in this report are preliminary and upon coder review may  be revised to meet current compliance requirements. Jerene Bears, MD 10/05/2020 9:29:58 AM This report has been signed electronically. Number of Addenda: 0

## 2020-10-06 DIAGNOSIS — K2990 Gastroduodenitis, unspecified, without bleeding: Secondary | ICD-10-CM

## 2020-10-06 DIAGNOSIS — K269 Duodenal ulcer, unspecified as acute or chronic, without hemorrhage or perforation: Secondary | ICD-10-CM

## 2020-10-06 DIAGNOSIS — K2970 Gastritis, unspecified, without bleeding: Secondary | ICD-10-CM

## 2020-10-06 LAB — GLUCOSE, CAPILLARY
Glucose-Capillary: 100 mg/dL — ABNORMAL HIGH (ref 70–99)
Glucose-Capillary: 133 mg/dL — ABNORMAL HIGH (ref 70–99)
Glucose-Capillary: 115 mg/dL — ABNORMAL HIGH (ref 70–99)
Glucose-Capillary: 109 mg/dL — ABNORMAL HIGH (ref 70–99)

## 2020-10-06 LAB — CBC
HCT: 24.3 % — ABNORMAL LOW (ref 36.0–46.0)
HCT: 26.3 % — ABNORMAL LOW (ref 36.0–46.0)
Hemoglobin: 7.3 g/dL — ABNORMAL LOW (ref 12.0–15.0)
Hemoglobin: 7.9 g/dL — ABNORMAL LOW (ref 12.0–15.0)
MCH: 25.5 pg — ABNORMAL LOW (ref 26.0–34.0)
MCH: 25.4 pg — ABNORMAL LOW (ref 26.0–34.0)
MCHC: 30 g/dL (ref 30.0–36.0)
MCHC: 30 g/dL (ref 30.0–36.0)
MCV: 85 fL (ref 80.0–100.0)
MCV: 84.6 fL (ref 80.0–100.0)
Platelets: 257 10*3/uL (ref 150–400)
Platelets: 276 10*3/uL (ref 150–400)
RBC: 2.86 MIL/uL — ABNORMAL LOW (ref 3.87–5.11)
RBC: 3.11 MIL/uL — ABNORMAL LOW (ref 3.87–5.11)
RDW: 17.2 % — ABNORMAL HIGH (ref 11.5–15.5)
RDW: 17.3 % — ABNORMAL HIGH (ref 11.5–15.5)
WBC: 9.2 10*3/uL (ref 4.0–10.5)
WBC: 9.4 10*3/uL (ref 4.0–10.5)
nRBC: 0.4 % — ABNORMAL HIGH (ref 0.0–0.2)
nRBC: 0.2 % (ref 0.0–0.2)

## 2020-10-06 LAB — COMPREHENSIVE METABOLIC PANEL
ALT: 11 U/L (ref 0–44)
AST: 14 U/L — ABNORMAL LOW (ref 15–41)
Albumin: 2.9 g/dL — ABNORMAL LOW (ref 3.5–5.0)
Alkaline Phosphatase: 42 U/L (ref 38–126)
Anion gap: 6 (ref 5–15)
BUN: 22 mg/dL — ABNORMAL HIGH (ref 6–20)
CO2: 25 mmol/L (ref 22–32)
Calcium: 9.8 mg/dL (ref 8.9–10.3)
Chloride: 108 mmol/L (ref 98–111)
Creatinine, Ser: 0.88 mg/dL (ref 0.44–1.00)
GFR, Estimated: >60 mL/min (ref >60)
Glucose, Bld: 118 mg/dL — ABNORMAL HIGH (ref 70–99)
Potassium: 4.4 mmol/L (ref 3.5–5.1)
Sodium: 139 mmol/L (ref 135–145)
Total Bilirubin: 1.1 mg/dL (ref 0.3–1.2)
Total Protein: 5.8 g/dL — ABNORMAL LOW (ref 6.5–8.1)

## 2020-10-06 LAB — SURGICAL PATHOLOGY

## 2020-10-06 MED ORDER — FUROSEMIDE 10 MG/ML IJ SOLN
40.0000 mg | Freq: Once | INTRAMUSCULAR | Status: AC
  Administered 2020-10-06: 40 mg via INTRAVENOUS
  Filled 2020-10-06: qty 4

## 2020-10-06 MED ORDER — CARVEDILOL 25 MG PO TABS
25.0000 mg | ORAL_TABLET | Freq: Two times a day (BID) | ORAL | Status: DC
  Administered 2020-10-06 – 2020-10-07 (×2): 25 mg via ORAL
  Filled 2020-10-06 (×2): qty 1

## 2020-10-06 MED ORDER — SODIUM CHLORIDE 0.9 % IV SOLN
250.0000 mg | Freq: Every day | INTRAVENOUS | Status: AC
  Administered 2020-10-06 – 2020-10-07 (×2): 250 mg via INTRAVENOUS
  Filled 2020-10-06 (×2): qty 20

## 2020-10-06 NOTE — Progress Notes (Signed)
Remote ICD transmission.   

## 2020-10-06 NOTE — Progress Notes (Signed)
PROGRESS NOTE    [name]  MKL:[id number] DOB: 20-Feb-1965 DOA: 10/04/2020 PCP: [name], FNP  Brief Narrative:Alexandra Bell is a 55 y.o. female with history h/o GERD, hypertension, morbid obesity,HOCM, NSVT, paroxysmal A. fib on Eliquis and s/p AICD/pacemaker, history of CBD stones s/p cholecystectomy, chronic arthritis who was recently admitted to this [facility] (9/4-9/7) for acute blood loss anemia in the setting of Eliquis use with hemoglobin 4.9->transfused 3 units of blood at that time and seen by GI->underwent EGD/colonoscopy showing nonbleeding duodenal ulcers, mild gastritis and nonbleeding internal hemorrhoids->discharged home with hemoglobin improved/stabilized at 7.6-8.0.  On 9/27 she returned to the hospital with generalized weakness, extreme fatigue and lightheadedness X 2 days, history of rib noticing maroon stools a week ago. -History of daily diclofenac use for arthritis in both knees  -In the ED she was hypotensive, work-up noted hemoglobin of 4.3  Assessment & Plan:   Acute blood loss anemia Upper GI bleed/duodenal ulcer/gastritis -Admitted with severe symptomatic anemia, hemoglobin of 4.3 -Transfused 3 units of PRBC  -History of daily NSAID use -Recent EGD noted mild gastritis with nonbleeding duodenal ulcers and internal hemorrhoids -EGD 9/28 noted gastritis and duodenal ulcer which was clipped, Eliquis on hold X1 week -Continue PPI -Hemoglobin is 7.3 this morning, will give IV iron, -Check CBC in a.m., discharge planning, increase activity, ambulation -Discussed with patient, importance of avoiding NSAIDs, follow-up H. Pylori  Hypertension -Resumed carvedilol  Paroxysmal A. Fib -Holding Eliquis x1 week, for severe upper GI bleed, duodenal ulcer -Resume carvedilol  AKI -Prerenal from blood loss, hold ACE/diuretics -Improved  Edema -Acute on chronic, worsening in the setting of anemia and blood transfusions, continue IV Lasix again today    Diabetes mellitus type 2 with neuropathy:  -Metformin, oral hypoglycemics on hold  -CBGs are stable, sliding scale insulin  HOCM, NSVT: S/p AICD.   -Denies history of pulmonary edema but reports taking Lasix for leg swellings. -IV Lasix x1 today   History of asthma: Currently stable, no wheezing.  Resume home inhaler therapy.   History of chronic arthritis, knee pains: Advised patient to avoid NSAIDs with history of duodenal ulcers and anticoagulant use..  Will utilize topical cream for now, tramadol as needed   Tobacco use: Reports cutting down recently and a pack now lasts for 3 days.  Encourage patient to quit given underlying heart disease/arrhythmias.  She acknowledges the need to quit but motivation questionable.    Morbid obesity class III (BMI 40-49.9): Advised diet compliance, follow-up PCP for long-term goals.   DVT prophylaxis: SCDs, anticoagulants on hold due to GIB   Code Status: Full code. Family Communication: Discussed patient in detail, no family at bedside Disposition Plan:  Status is: : Inpatient level of care appropriate due to severity of illness  Dispo: The patient is from: Home              Anticipated d/c is to: Home tomorrow              Patient currently is not medically stable to d/c.   Difficult to place patient No   Consultants:  Gastroenterology  Procedures: EGD  impression:               - Normal esophagus.                           - Scattered gastritis with scant adherent heme but  no active bleeding or ulcerations. Biopsied.                           - Duodenitis with 1 ulcer with hemorrhage. Clips                            (MR conditional) were placed x 3.                           - Normal second portion of the duodenum. Moderate Sedation:      N/A Recommendation:           - Return patient to hospital ward for ongoing care.                           - Advance diet as tolerated.                           -  Continue present medications.                           - BID PPI.                           - No aspirin, ibuprofen, naproxen, or other                            non-steroidal anti-inflammatory drugs. No                            diclofenac.                           - Given rebleeding and endoscopic therapy today                            would recommending holding Eliquis x 7 days if                            possible.                           - Await pathology results to exclude H. Pylori,                            though NSAIDs at least contributing to                            gastroduodenitis and ulceration.  Antimicrobials:    Subjective: -Feels a bit weak, otherwise okay overall  Objective: Vitals:   10/06/20 0800 10/06/20 0900 10/06/20 1000 10/06/20 1142  BP: (!) 145/66 (!) 151/66 (!) 161/62 (!) 132/55  Pulse: 66   75  Resp:   18   Temp: 98.7 F (37.1 C)   98.7 F (37.1 C)  TempSrc: Oral   Oral  SpO2:      Weight:      Height:        Intake/Output Summary (Last  24 hours) at 10/06/2020 1245 Last data filed at 10/06/2020 1143 Gross per 24 hour  Intake 958 ml  Output 2950 ml  Net -1992 ml   Filed Weights   10/04/20 0858 10/05/20 0802  Weight: 125 kg 125 kg    Examination:  General exam: Pleasant obese female laying in bed, AAOx3, no distress HEENT: No JVD CVS: S1-S2, regular rate rhythm Lungs: Clear bilaterally Abdomen: Soft, nontender, obese, nondistended, bowel sounds present Extremities: Trace edema Skin: No rash on exposed skin   Data Reviewed:   CBC: Recent Labs  Lab 10/04/20 0846 10/04/20 1646 10/05/20 0411 10/06/20 0320  WBC 10.0  --   --  9.2  NEUTROABS 7.9*  --   --   --   HGB 4.3* 7.5* 7.4* 7.3*  HCT 15.7* 24.9* 24.3* 24.3*  MCV 84.0  --   --  85.0  PLT 277  --   --  867   Basic Metabolic Panel: Recent Labs  Lab 10/04/20 0846 10/05/20 0411 10/06/20 0320  NA 138 141 139  K 4.3 3.8 4.4  CL 107 108 108  CO2 21* 26 25   GLUCOSE 123* 101* 118*  BUN 46* 38* 22*  CREATININE 1.62* 1.15* 0.88  CALCIUM 9.5 9.5 9.8   GFR: Estimated Creatinine Clearance: 96.5 mL/min (by C-G formula based on SCr of 0.88 mg/dL). Liver Function Tests: Recent Labs  Lab 10/04/20 0846 10/06/20 0320  AST 17 14*  ALT 11 11  ALKPHOS 45 42  BILITOT 0.5 1.1  PROT 5.6* 5.8*  ALBUMIN 3.0* 2.9*   No results for input(s): LIPASE, AMYLASE in the last 168 hours. No results for input(s): AMMONIA in the last 168 hours. Coagulation Profile: Recent Labs  Lab 10/04/20 0846  INR 1.9*   Cardiac Enzymes: No results for input(s): CKTOTAL, CKMB, CKMBINDEX, TROPONINI in the last 168 hours. BNP (last 3 results) No results for input(s): PROBNP in the last 8760 hours. HbA1C: No results for input(s): HGBA1C in the last 72 hours. CBG: Recent Labs  Lab 10/05/20 1116 10/05/20 1615 10/05/20 2251 10/06/20 0803 10/06/20 1139  GLUCAP 112* 118* 127* 100* 133*   Lipid Profile: No results for input(s): CHOL, HDL, LDLCALC, TRIG, CHOLHDL, LDLDIRECT in the last 72 hours. Thyroid Function Tests: No results for input(s): TSH, T4TOTAL, FREET4, T3FREE, THYROIDAB in the last 72 hours. Anemia Panel: No results for input(s): VITAMINB12, FOLATE, FERRITIN, TIBC, IRON, RETICCTPCT in the last 72 hours. Urine analysis:    Component Value Date/Time   COLORURINE YELLOW 11/28/2010 1033   APPEARANCEUR HAZY (A) 11/28/2010 1033   LABSPEC 1.021 11/28/2010 1033   PHURINE 5.5 11/28/2010 1033   GLUCOSEU NEGATIVE 11/28/2010 1033   HGBUR MODERATE (A) 11/28/2010 1033   BILIRUBINUR negative 05/07/2019 1645   KETONESUR NEGATIVE 11/28/2010 1033   PROTEINUR Negative 05/07/2019 1645   PROTEINUR 30 (A) 11/28/2010 1033   UROBILINOGEN 0.2 05/07/2019 1645   UROBILINOGEN 0.2 11/28/2010 1033   NITRITE negative 05/07/2019 1645   NITRITE NEGATIVE 11/28/2010 1033   LEUKOCYTESUR Negative 05/07/2019 1645   Sepsis  Labs: @LABRCNTIP (procalcitonin:4,lacticidven:4)  ) Recent Results (from the past 240 hour(s))  Resp Panel by RT-PCR (Flu A&B, Covid) Nasopharyngeal Swab     Status: None   Collection Time: 10/04/20  9:41 AM   Specimen: Nasopharyngeal Swab; Nasopharyngeal(NP) swabs in vial transport medium  Result Value Ref Range Status   SARS Coronavirus 2 by RT PCR NEGATIVE NEGATIVE Final    Comment: (NOTE) SARS-CoV-2 target nucleic acids are NOT DETECTED.  The SARS-CoV-2 RNA is generally detectable in upper respiratory specimens during the acute phase of infection. The lowest concentration of SARS-CoV-2 viral copies this assay can detect is 138 copies/mL. A negative result does not preclude SARS-Cov-2 infection and should not be used as the sole basis for treatment or other patient management decisions. A negative result may occur with  improper specimen collection/handling, submission of specimen other than nasopharyngeal swab, presence of viral mutation(s) within the areas targeted by this assay, and inadequate number of viral copies(<138 copies/mL). A negative result must be combined with clinical observations, patient history, and epidemiological information. The expected result is Negative.  Fact Sheet for Patients:  EntrepreneurPulse.com.au  Fact Sheet for Healthcare Providers:  IncredibleEmployment.be  This test is no t yet approved or cleared by the [place] FDA and  has been authorized for detection and/or diagnosis of SARS-CoV-2 by FDA under an Emergency Use Authorization (EUA). This EUA will remain  in effect (meaning this test can be used) for the duration of the COVID-19 declaration under Section 564(b)(1) of the Act, 21 U.S.C.section 360bbb-3(b)(1), unless the authorization is terminated  or revoked sooner.       Influenza A by PCR NEGATIVE NEGATIVE Final   Influenza B by PCR NEGATIVE NEGATIVE Final    Comment: (NOTE) The Xpert  Xpress SARS-CoV-2/FLU/RSV plus assay is intended as an aid in the diagnosis of influenza from Nasopharyngeal swab specimens and should not be used as a sole basis for treatment. Nasal washings and aspirates are unacceptable for Xpert Xpress SARS-CoV-2/FLU/RSV testing.  Fact Sheet for Patients: EntrepreneurPulse.com.au  Fact Sheet for Healthcare Providers: IncredibleEmployment.be  This test is not yet approved or cleared by the [place] FDA and has been authorized for detection and/or diagnosis of SARS-CoV-2 by FDA under an Emergency Use Authorization (EUA). This EUA will remain in effect (meaning this test can be used) for the duration of the COVID-19 declaration under Section 564(b)(1) of the Act, 21 U.S.C. section 360bbb-3(b)(1), unless the authorization is terminated or revoked.  Performed at [facility], [place] [address], [place], [place] [address]     Scheduled Meds:  carvedilol  12.5 mg Oral BID   gabapentin  300 mg Oral BID   insulin aspart  0-15 Units Subcutaneous TID WC   magnesium oxide  200 mg Oral Q1400   melatonin  5 mg Oral QHS   multivitamin with minerals  1 tablet Oral q AM   pantoprazole (PROTONIX) IV  40 mg Intravenous Q12H   Continuous Infusions:  ferric gluconate (FERRLECIT) IVPB Stopped (10/06/20 1239)     LOS: 1 day    Time spent: 76min    Domenic Polite, MD [facility]   10/06/2020, 12:45 PM

## 2020-10-06 NOTE — Progress Notes (Signed)
   10/06/20 0458  Notify: Provider  Provider Name/Title Dr. Tonie Griffith  Date Provider Notified 10/06/20  Time Provider Notified [phone]  Notification Type Page (secure chat)  Notification Reason Other (Comment) (12 beats of Vtach)  Provider response No new orders (will con to monitor)  Date of Provider Response 10/06/20  Time of Provider Response [phone]   Patient denied any symptoms.

## 2020-10-06 NOTE — Progress Notes (Signed)
[facility] Progress Note  CC:  GI bleed, DU  Subjective:  No BM since admission.  Feels good.  Has been on clear liquids, just advanced to soft diet by hospitalist.  Objective:  Vital signs in last 24 hours: Temp:  [98 F (36.7 C)-98.7 F (37.1 C)] 98.7 F (37.1 C) (09/29 0800) Pulse Rate:  [60-78] 66 (09/29 0800) Resp:  [18-20] 18 (09/29 1000) BP: (119-161)/(45-79) 161/62 (09/29 1000) SpO2:  [92 %-96 %] 92 % (09/29 0500) Last BM Date: 10/03/20 General:  Alert, Well-developed, in NAD Heart:  Regular rate and rhythm; SEM noted Pulm:  Slight wheezing noted B/L. Abdomen:  Soft, non-distended.  BS present.  Non-tender.  Extremities:  2-3+ pitting edema noted in B/L LEs. Neurologic:  Alert and oriented x 4;  grossly normal neurologically. Psych:  Alert and cooperative. Normal mood and affect.  Intake/Output from previous day: 09/28 0701 - 09/29 0700 In: 120 [P.O.:120] Out: 2450 [Urine:2450]  Lab Results: Recent Labs    10/04/20 0846 10/04/20 1646 10/05/20 0411 10/06/20 0320  WBC 10.0  --   --  9.2  HGB 4.3* 7.5* 7.4* 7.3*  HCT 15.7* 24.9* 24.3* 24.3*  PLT 277  --   --  257   BMET Recent Labs    10/04/20 0846 10/05/20 0411 10/06/20 0320  NA 138 141 139  K 4.3 3.8 4.4  CL 107 108 108  CO2 21* 26 25  GLUCOSE 123* 101* 118*  BUN 46* 38* 22*  CREATININE 1.62* 1.15* 0.88  CALCIUM 9.5 9.5 9.8   LFT Recent Labs    10/06/20 0320  PROT 5.8*  ALBUMIN 2.9*  AST 14*  ALT 11  ALKPHOS 42  BILITOT 1.1   PT/INR Recent Labs    10/04/20 0846  LABPROT 21.9*  INR 1.9*   CT ABDOMEN PELVIS WO CONTRAST  Result Date: 10/04/2020 CLINICAL DATA:  GI bleed, recurrent anemia on Eliquis EXAM: CT ABDOMEN AND PELVIS WITHOUT CONTRAST TECHNIQUE: Multidetector CT imaging of the abdomen and pelvis was performed following the standard protocol without IV contrast. COMPARISON:  CT abdomen/pelvis 11/28/2010 FINDINGS: Lower chest: There is mosaic attenuation in  the lung bases likely reflecting air trapping. The heart is enlarged. Cardiac device leads are partially imaged. There is trace pericardial fluid. Hepatobiliary: The liver is unremarkable, within the confines of noncontrast technique. The gallbladder is surgically absent. There is no intra or extrahepatic biliary ductal dilatation. Pancreas: Unremarkable. Spleen: Unremarkable. Adrenals/Urinary Tract: There is an approximately 1.6 cm left adrenal nodule measuring 1.6 Hounsfield units consistent with a benign adenoma. The right adrenal is unremarkable. The kidneys are unremarkable. There are no focal lesions, within the confines of noncontrast technique. There are no stones. There is no hydronephrosis or hydroureter. The bladder is unremarkable. Stomach/Bowel: The stomach is unremarkable. There is no evidence of bowel obstruction. There is no abnormal bowel wall thickening or inflammatory change. There is no definite blood in the bowel lumen, though this is suboptimally evaluated given single-phase noncontrast technique. Vascular/Lymphatic: There is scattered calcified atherosclerotic plaque in the nonaneurysmal abdominal aorta. There is no abdominal or pelvic lymphadenopathy. Reproductive: The uterus is surgically absent. There is no adnexal mass. Other: There is small volume simple fluid in the pelvis, nonspecific. There is no free intraperitoneal air. There is no evidence of retroperitoneal hematoma. There is a small fat containing umbilical hernia, not significantly changed since 2012. There is a small fat containing right inguinal hernia. Musculoskeletal: There is grade 1 anterolisthesis of L4  on L5 and L5 on S1. 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 There is no acute osseous abnormality or aggressive osseous lesion. IMPRESSION: 1. No definite evidence of GI bleed, suboptimally evaluated given single-phase noncontrast technique. 2. No evidence of retroperitoneal hematoma. 3. Small volume simple  fluid in the pelvis, nonspecific. 4. Benign left adrenal adenoma. 5. Cardiomegaly with trace pericardial effusion. 6. Mosaic attenuation in the lung bases likely reflecting air trapping. Aortic Atherosclerosis (ICD10-I70.0). Electronically Signed   By: Valetta Mole M.D.   On: 10/04/2020 12:29    Assessment / Plan: *UGI bleed secondary to DU seen on EGD 9/28:  Duodenitis with 1 ulcer with hemorrhage. Clips (MR conditional) were placed x 3.  Suspect due to NSAID use but biopsies pending for Hpylori. *ALBA/iron deficiency anemia:  Secondary to above.  Hgb 4.3 grams on admission compared to 8.0 grams on 9/7.  Received 3 units PRBCs.  Hgb stable at 7.3 grams this AM.  Was on pantoprazole 40 mg BID at home. *PAF on Eliquis with last dose this AM, 9/27. *NSAID:  Was still on diclofenac at home.  -Continue pantoprazole 40 mg BID. -NO NSAIDs!!!  Needs another regimen for her arthritic pain. -If positive for Hpylori will treat. -Hold Eliquis until 9/6 if possible. -Ok for soft diet.   LOS: 1 day   Laban Emperor. Nasira Janusz  10/06/2020, 10:19 AM

## 2020-10-07 ENCOUNTER — Encounter (HOSPITAL_COMMUNITY): Payer: Self-pay | Admitting: Internal Medicine

## 2020-10-07 LAB — BASIC METABOLIC PANEL
Anion gap: 6 (ref 5–15)
BUN: 13 mg/dL (ref 6–20)
CO2: 26 mmol/L (ref 22–32)
Calcium: 9.8 mg/dL (ref 8.9–10.3)
Chloride: 109 mmol/L (ref 98–111)
Creatinine, Ser: 0.81 mg/dL (ref 0.44–1.00)
GFR, Estimated: >60 mL/min (ref >60)
Glucose, Bld: 115 mg/dL — ABNORMAL HIGH (ref 70–99)
Potassium: 3.8 mmol/L (ref 3.5–5.1)
Sodium: 141 mmol/L (ref 135–145)

## 2020-10-07 LAB — GLUCOSE, CAPILLARY
Glucose-Capillary: 102 mg/dL — ABNORMAL HIGH (ref 70–99)
Glucose-Capillary: 116 mg/dL — ABNORMAL HIGH (ref 70–99)

## 2020-10-07 LAB — CBC
HCT: 26.1 % — ABNORMAL LOW (ref 36.0–46.0)
Hemoglobin: 7.8 g/dL — ABNORMAL LOW (ref 12.0–15.0)
MCH: 25.3 pg — ABNORMAL LOW (ref 26.0–34.0)
MCHC: 29.9 g/dL — ABNORMAL LOW (ref 30.0–36.0)
MCV: 84.7 fL (ref 80.0–100.0)
Platelets: 243 10*3/uL (ref 150–400)
RBC: 3.08 MIL/uL — ABNORMAL LOW (ref 3.87–5.11)
RDW: 17.4 % — ABNORMAL HIGH (ref 11.5–15.5)
WBC: 9.7 10*3/uL (ref 4.0–10.5)
nRBC: 0.4 % — ABNORMAL HIGH (ref 0.0–0.2)

## 2020-10-07 MED ORDER — POTASSIUM CHLORIDE CRYS ER 20 MEQ PO TBCR: 20.0000 meq | EXTENDED_RELEASE_TABLET | Freq: Every day | ORAL | 0 refills | Status: DC

## 2020-10-07 MED ORDER — FERROUS SULFATE 325 (65 FE) MG PO TBEC: 325.0000 mg | DELAYED_RELEASE_TABLET | Freq: Two times a day (BID) | ORAL | 3 refills | Status: DC

## 2020-10-07 MED ORDER — APIXABAN 5 MG PO TABS: 5.0000 mg | ORAL_TABLET | Freq: Two times a day (BID) | ORAL | Status: DC

## 2020-10-07 MED ORDER — ACETAMINOPHEN 500 MG PO TABS: 1000.0000 mg | ORAL_TABLET | Freq: Four times a day (QID) | ORAL | 0 refills | Status: DC | PRN

## 2020-10-07 MED ORDER — TRAMADOL HCL 50 MG PO TABS
50.0000 mg | ORAL_TABLET | Freq: Once | ORAL | Status: AC
  Administered 2020-10-07: 50 mg via ORAL
  Filled 2020-10-07: qty 1

## 2020-10-07 MED ORDER — GABAPENTIN 300 MG PO CAPS
300.0000 mg | ORAL_CAPSULE | Freq: Two times a day (BID) | ORAL | 0 refills | Status: DC
Start: 2020-10-07 — End: 2020-10-19

## 2020-10-07 MED ORDER — TRAMADOL HCL 50 MG PO TABS: 50.0000 mg | ORAL_TABLET | Freq: Two times a day (BID) | ORAL | 0 refills | Status: DC | PRN

## 2020-10-07 NOTE — Progress Notes (Signed)
Discharge instructions (including medications) discussed with and copy provided to patient/caregiver 

## 2020-10-07 NOTE — Discharge Summary (Signed)
Physician Discharge Summary  Alexandra Bell XNA:[id number] DOB: 1965-05-01 DOA: 10/04/2020  PCP: [name], FNP  Admit date: 10/04/2020 Discharge date: 10/07/2020  Time spent: 73minutes  Recommendations for Outpatient Follow-up:  PCP in 7 to 10 days, please check CBC at follow-up, monitor iron stores.   Discharge Diagnoses:  Principal Problem:   Symptomatic anemia Upper GI bleed Duodenal ulcer Morbid obesity   Essential hypertension   Asthma   Acute renal failure (HCC)   HOCM (hypertrophic obstructive cardiomyopathy) (HCC)   Morbid obesity (HCC)   NSVT (nonsustained ventricular tachycardia) (HCC)   Upper GI bleed   AF (paroxysmal atrial fibrillation) (HCC)   Tobacco use disorder   Type 2 diabetes mellitus ([place])   Diabetic neuropathy ([place])   Acute blood loss anemia   Gastritis and gastroduodenitis   Discharge Condition: Stable  Diet recommendation: Diabetic, low-sodium, heart healthy  Filed Weights   10/04/20 0858 10/05/20 0802  Weight: 125 kg 125 kg    History of present illness:  Alexandra Bell is a 55 y.o. female with history h/o GERD, hypertension, morbid obesity,HOCM, NSVT, paroxysmal A. fib on Eliquis and s/p AICD/pacemaker, history of CBD stones s/p cholecystectomy, chronic arthritis who was recently admitted to this [facility] (9/4-9/7) for acute blood loss anemia in the setting of Eliquis use with hemoglobin 4.9->transfused 3 units of blood at that time and seen by GI->underwent EGD/colonoscopy showing nonbleeding duodenal ulcers, mild gastritis and nonbleeding internal hemorrhoids->discharged home with hemoglobin improved/stabilized at 7.6-8.0.  On 9/27 she returned to the hospital with generalized weakness, extreme fatigue and lightheadedness X 2 days, history of rib noticing maroon stools a week ago. -History of daily diclofenac use for arthritis in both knees  -In the ED she was hypotensive, work-up noted hemoglobin of 4.3  Hospital Course:    Acute blood loss anemia Upper GI bleed/duodenal ulcer/gastritis -Admitted with severe symptomatic iron deficiency anemia, hemoglobin was 4.3 she was transfused 3 units of PRBC this admission, history of daily NSAID use for osteoarthritis  -Recent EGD noted mild gastritis with nonbleeding duodenal ulcers and internal hemorrhoids -EGD 9/28 noted gastritis and duodenal ulcer which was clipped, recommended to hold eliquis x 1 week, restart on Wednesday 10/5 -Continue PPI -Hemoglobin up to 7.8 at discharge, given IV iron yesterday and today, discharged home on oral oral iron therapy, H. pylori studies are pending -Follow-up with PCP in 1 week and recheck iron stores, CBC at follow-up -Educated regarding avoiding NSAIDs, recommended Tylenol as needed for arthritis and tramadol 70   Hypertension -Resumed carvedilol   Paroxysmal A. Fib -Holding Eliquis x1 week, for severe upper GI bleed, duodenal ulcer as above -Resumed carvedilol   AKI -Prerenal from blood loss, hold ACE/diuretics -Improved    Diabetes mellitus type 2 with neuropathy:  -Metformin, oral hypoglycemics received   Chronic diastolic CHF History of HOCM, NSVT: S/p AICD.   Chronic extremity edema -Denies history of pulmonary edema but reports taking Lasix for leg swelling -Prior 2D echo in 2021 noted EF preserved EF, grade 2 diastolic dysfunction Lasix at discharge, recommended compliance with low-salt diet   History of asthma: Currently stable, no wheezing.  Resumed home inhaler therapy.   History of chronic arthritis, knee pains: Advised patient to avoid NSAIDs with history of duodenal ulcers and anticoagulant use..  Will utilize topical cream for now, tramadol as needed   Tobacco use: Reports cutting down recently and a pack now lasts for 3 days.  Encourage patient to quit given underlying heart disease/arrhythmias.  She acknowledges the need to quit but motivation questionable.    Morbid obesity class III (BMI 40-49.9):  Advised diet compliance, follow-up PCP for long-term goals  Discharge Exam: Vitals:   10/07/20 0430 10/07/20 1011  BP: (!) 126/54 (!) 147/57  Pulse: 64 73  Resp: 20   Temp: 98.7 F (37.1 C)   SpO2: 93%     General: AAOx3 Cardiovascular: S1S2/RRR Respiratory: CTAB  Discharge Instructions   Discharge Instructions     Diet - low sodium heart healthy   Complete by: As directed    Diet Carb Modified   Complete by: As directed    Increase activity slowly   Complete by: As directed       Allergies as of 10/07/2020   No Known Allergies      Medication List     STOP taking these medications    Diclofenac Sodium CR 100 MG 24 hr tablet   lisinopril 40 MG tablet Commonly known as: ZESTRIL       TAKE these medications    acetaminophen 500 MG tablet Commonly known as: TYLENOL Take 2 tablets (1,000 mg total) by mouth every 6 (six) hours as needed (For pain). What changed: how much to take   albuterol 108 (90 Base) MCG/ACT inhaler Commonly known as: VENTOLIN HFA Inhale 2 puffs into the lungs every 6 (six) hours as needed. For asthma   amLODipine 10 MG tablet Commonly known as: NORVASC Take 1 tablet (10 mg total) by mouth daily.   apixaban 5 MG Tabs tablet Commonly known as: Eliquis Take 1 tablet (5 mg total) by mouth 2 (two) times daily. Restart on Wednesday 10/5 Start taking on: October 12, 2020 What changed:  how much to take additional instructions These instructions start on October 12, 2020. If you are unsure what to do until then, ask your doctor or other care provider.   carvedilol 25 MG tablet Commonly known as: COREG Take 1 tablet (25 mg total) by mouth 2 (two) times daily.   dapagliflozin propanediol 10 MG Tabs tablet Commonly known as: Farxiga Take 1 tablet (10 mg total) by mouth daily before breakfast.   diclofenac Sodium 1 % Gel Commonly known as: VOLTAREN APPLY 2 G TOPICALLY FOUR TIMES DAILY. What changed: See the new instructions.    ferrous sulfate 325 (65 FE) MG EC tablet Take 1 tablet (325 mg total) by mouth 2 (two) times daily with a meal.   furosemide 80 MG tablet Commonly known as: Lasix Take 1 tablet (80 mg total) by mouth daily.   gabapentin 300 MG capsule Commonly known as: NEURONTIN Take 1 capsule (300 mg total) by mouth 2 (two) times daily. What changed: when to take this   Magnesium 250 MG Tabs Take 250 mg by mouth daily at 2 PM.   melatonin 5 MG Tabs Take 5 mg by mouth daily at 2 PM.   metFORMIN 500 MG tablet Commonly known as: GLUCOPHAGE TAKE 1 TABLET BY MOUTH TWICE DAILY WITH A MEAL   multivitamin with minerals Tabs tablet Take 1 tablet by mouth daily.   pantoprazole 40 MG tablet Commonly known as: PROTONIX Take 1 tablet (40 mg total) by mouth 2 (two) times daily.   potassium chloride SA 20 MEQ tablet Commonly known as: KLOR-CON Take 1 tablet (20 mEq total) by mouth daily.   traMADol 50 MG tablet Commonly known as: ULTRAM Take 1 tablet (50 mg total) by mouth every 12 (twelve) hours as needed for severe pain or moderate pain.  No Known Allergies  Follow-up Information     [name], FNP. Schedule an appointment as soon as possible for a visit in 1 week(s).   Specialty: General Practice Contact information: [address] [place] [address] [phone]                  The results of significant diagnostics from this hospitalization (including imaging, microbiology, ancillary and laboratory) are listed below for reference.    Significant Diagnostic Studies: CT ABDOMEN PELVIS WO CONTRAST  Result Date: 10/04/2020 CLINICAL DATA:  GI bleed, recurrent anemia on Eliquis EXAM: CT ABDOMEN AND PELVIS WITHOUT CONTRAST TECHNIQUE: Multidetector CT imaging of the abdomen and pelvis was performed following the standard protocol without IV contrast. COMPARISON:  CT abdomen/pelvis 11/28/2010 FINDINGS: Lower chest: There is mosaic attenuation in the lung  bases likely reflecting air trapping. The heart is enlarged. Cardiac device leads are partially imaged. There is trace pericardial fluid. Hepatobiliary: The liver is unremarkable, within the confines of noncontrast technique. The gallbladder is surgically absent. There is no intra or extrahepatic biliary ductal dilatation. Pancreas: Unremarkable. Spleen: Unremarkable. Adrenals/Urinary Tract: There is an approximately 1.6 cm left adrenal nodule measuring 1.6 Hounsfield units consistent with a benign adenoma. The right adrenal is unremarkable. The kidneys are unremarkable. There are no focal lesions, within the confines of noncontrast technique. There are no stones. There is no hydronephrosis or hydroureter. The bladder is unremarkable. Stomach/Bowel: The stomach is unremarkable. There is no evidence of bowel obstruction. There is no abnormal bowel wall thickening or inflammatory change. There is no definite blood in the bowel lumen, though this is suboptimally evaluated given single-phase noncontrast technique. Vascular/Lymphatic: There is scattered calcified atherosclerotic plaque in the nonaneurysmal abdominal aorta. There is no abdominal or pelvic lymphadenopathy. Reproductive: The uterus is surgically absent. There is no adnexal mass. Other: There is small volume simple fluid in the pelvis, nonspecific. There is no free intraperitoneal air. There is no evidence of retroperitoneal hematoma. There is a small fat containing umbilical hernia, not significantly changed since 2012. There is a small fat containing right inguinal hernia. Musculoskeletal: There is grade 1 anterolisthesis of L4 on L5 and L5 on S1. 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 There is no acute osseous abnormality or aggressive osseous lesion. IMPRESSION: 1. No definite evidence of GI bleed, suboptimally evaluated given single-phase noncontrast technique. 2. No evidence of retroperitoneal hematoma. 3. Small volume simple fluid in  the pelvis, nonspecific. 4. Benign left adrenal adenoma. 5. Cardiomegaly with trace pericardial effusion. 6. Mosaic attenuation in the lung bases likely reflecting air trapping. Aortic Atherosclerosis (ICD10-I70.0). Electronically Signed   By: Valetta Mole M.D.   On: 10/04/2020 12:29   CT Head Wo Contrast  Result Date: 09/11/2020 CLINICAL DATA:  Mental status change EXAM: CT HEAD WITHOUT CONTRAST TECHNIQUE: Contiguous axial images were obtained from the base of the skull through the vertex without intravenous contrast. COMPARISON:  None. FINDINGS: Brain: No evidence of acute infarction, hemorrhage, hydrocephalus, extra-axial collection or mass lesion/mass effect. Vascular: Atherosclerotic and physiologic intracranial calcifications. Skull: Normal. Negative for fracture or focal lesion. Sinuses/Orbits: No acute finding. Other: Cerumen partially occludes the right external auditory canal IMPRESSION: No acute findings Electronically Signed   By: Lucrezia Europe M.D.   On: 09/11/2020 15:54   DG Chest Portable 1 View  Result Date: 10/04/2020 CLINICAL DATA:  Shortness of breath. EXAM: PORTABLE CHEST 1 VIEW COMPARISON:  September 11, 2020. FINDINGS: Stable cardiomegaly. Left-sided pacemaker is unchanged in  position. Lungs are clear. Bony thorax is unremarkable. IMPRESSION: No active disease. Electronically Signed   By: Marijo Conception M.D.   On: 10/04/2020 09:47   DG Chest Port 1 View  Result Date: 09/11/2020 CLINICAL DATA:  Weakness, lethargy EXAM: PORTABLE CHEST 1 VIEW COMPARISON:  Radiograph 03/25/2020, chest CT 07/14/2009 FINDINGS: Unchanged, enlarged cardiac silhouette with dual chamber pacemaker leads. There are bibasilar opacities, which are persistent on multiple prior exams including chest CT in 2011. No new airspace disease. No acute osseous abnormality. IMPRESSION: Unchanged, enlarged cardiac silhouette. Persistent bibasilar opacities, seen on multiple prior exams dating back to 2011, which could represent  chronic interstitial lung disease. No new airspace disease. Electronically Signed   By: Maurine Simmering M.D.   On: 09/11/2020 15:28   CUP PACEART REMOTE DEVICE CHECK  Result Date: 09/30/2020 Scheduled remote reviewed. Normal device function.  1 NS episode 189 bpm x 14 sec. No EGM. 18 ATR episodes. Longest 4 hr 08/02/20. CVR. +OAC per prior clinic note. Next remote 91 days.  VAS [place] LOWER EXTREMITY VENOUS (DVT)  Result Date: 09/12/2020  Lower Venous DVT Study Patient Name:  Alexandra Bell  Date of Exam:   09/12/2020 Medical Rec #: [id number]         Accession #:    [id number] Date of Birth: 1965/11/08         Patient Gender: F Patient Age:   50 years Exam Location:  [facility] Procedure:      VAS [place] LOWER EXTREMITY VENOUS (DVT) Referring Phys: ERIC [place] --------------------------------------------------------------------------------  Indications: Edema.  Limitations: Poor ultrasound/tissue interface and body habitus. Comparison Study: no prior Performing Technologist: Archie Patten RVS  Examination Guidelines: A complete evaluation includes B-mode imaging, spectral Doppler, color Doppler, and power Doppler as needed of all accessible portions of each vessel. Bilateral testing is considered an integral part of a complete examination. Limited examinations for reoccurring indications may be performed as noted. The reflux portion of the exam is performed with the patient in reverse Trendelenburg.  +---------+---------------+---------+-----------+----------+--------------+ RIGHT    CompressibilityPhasicitySpontaneityPropertiesThrombus Aging +---------+---------------+---------+-----------+----------+--------------+ CFV      Full           Yes      Yes                                 +---------+---------------+---------+-----------+----------+--------------+ SFJ      Full                                                         +---------+---------------+---------+-----------+----------+--------------+ FV Prox  Full                                                        +---------+---------------+---------+-----------+----------+--------------+ FV Mid   Full                                                        +---------+---------------+---------+-----------+----------+--------------+ FV  Distal               Yes      Yes                                 +---------+---------------+---------+-----------+----------+--------------+ PFV      Full                                                        +---------+---------------+---------+-----------+----------+--------------+ POP      Full           Yes      Yes                                 +---------+---------------+---------+-----------+----------+--------------+ PTV      Full                                                        +---------+---------------+---------+-----------+----------+--------------+ PERO     Full                                                        +---------+---------------+---------+-----------+----------+--------------+   +---------+---------------+---------+-----------+----------+--------------+ LEFT     CompressibilityPhasicitySpontaneityPropertiesThrombus Aging +---------+---------------+---------+-----------+----------+--------------+ CFV      Full           Yes      Yes                                 +---------+---------------+---------+-----------+----------+--------------+ SFJ      Full                                                        +---------+---------------+---------+-----------+----------+--------------+ FV Prox  Full                                                        +---------+---------------+---------+-----------+----------+--------------+ FV Mid   Full                                                         +---------+---------------+---------+-----------+----------+--------------+ FV DistalFull                                                        +---------+---------------+---------+-----------+----------+--------------+  PFV      Full                                                        +---------+---------------+---------+-----------+----------+--------------+ POP      Full           Yes      Yes                                 +---------+---------------+---------+-----------+----------+--------------+ PTV      Full                                                        +---------+---------------+---------+-----------+----------+--------------+ PERO     Full                                                        +---------+---------------+---------+-----------+----------+--------------+     Summary: BILATERAL: - No evidence of deep vein thrombosis seen in the lower extremities, bilaterally. -No evidence of popliteal cyst, bilaterally.   *See table(s) above for measurements and observations. Electronically signed by Deitra Mayo MD on 09/12/2020 at 3:18:01 PM.    Final     Microbiology: Recent Results (from the past 240 hour(s))  Resp Panel by RT-PCR (Flu A&B, Covid) Nasopharyngeal Swab     Status: None   Collection Time: 10/04/20  9:41 AM   Specimen: Nasopharyngeal Swab; Nasopharyngeal(NP) swabs in vial transport medium  Result Value Ref Range Status   SARS Coronavirus 2 by RT PCR NEGATIVE NEGATIVE Final    Comment: (NOTE) SARS-CoV-2 target nucleic acids are NOT DETECTED.  The SARS-CoV-2 RNA is generally detectable in upper respiratory specimens during the acute phase of infection. The lowest concentration of SARS-CoV-2 viral copies this assay can detect is 138 copies/mL. A negative result does not preclude SARS-Cov-2 infection and should not be used as the sole basis for treatment or other patient management decisions. A negative result may occur with   improper specimen collection/handling, submission of specimen other than nasopharyngeal swab, presence of viral mutation(s) within the areas targeted by this assay, and inadequate number of viral copies(<138 copies/mL). A negative result must be combined with clinical observations, patient history, and epidemiological information. The expected result is Negative.  Fact Sheet for Patients:  EntrepreneurPulse.com.au  Fact Sheet for Healthcare Providers:  IncredibleEmployment.be  This test is no t yet approved or cleared by the [place] FDA and  has been authorized for detection and/or diagnosis of SARS-CoV-2 by FDA under an Emergency Use Authorization (EUA). This EUA will remain  in effect (meaning this test can be used) for the duration of the COVID-19 declaration under Section 564(b)(1) of the Act, 21 U.S.C.section 360bbb-3(b)(1), unless the authorization is terminated  or revoked sooner.       Influenza A by PCR NEGATIVE NEGATIVE Final   Influenza B by PCR NEGATIVE NEGATIVE Final    Comment: (NOTE) The Xpert Xpress SARS-CoV-2/FLU/RSV plus assay  is intended as an aid in the diagnosis of influenza from Nasopharyngeal swab specimens and should not be used as a sole basis for treatment. Nasal washings and aspirates are unacceptable for Xpert Xpress SARS-CoV-2/FLU/RSV testing.  Fact Sheet for Patients: EntrepreneurPulse.com.au  Fact Sheet for Healthcare Providers: IncredibleEmployment.be  This test is not yet approved or cleared by the [place] FDA and has been authorized for detection and/or diagnosis of SARS-CoV-2 by FDA under an Emergency Use Authorization (EUA). This EUA will remain in effect (meaning this test can be used) for the duration of the COVID-19 declaration under Section 564(b)(1) of the Act, 21 U.S.C. section 360bbb-3(b)(1), unless the authorization is terminated  or revoked.  Performed at [facility], [place] [address], [place], [place] [address]      Labs: Basic Metabolic Panel: Recent Labs  Lab 10/04/20 0846 10/05/20 0411 10/06/20 0320 10/07/20 0243  NA 138 141 139 141  K 4.3 3.8 4.4 3.8  CL 107 108 108 109  CO2 21* 26 25 26   GLUCOSE 123* 101* 118* 115*  BUN 46* 38* 22* 13  CREATININE 1.62* 1.15* 0.88 0.81  CALCIUM 9.5 9.5 9.8 9.8   Liver Function Tests: Recent Labs  Lab 10/04/20 0846 10/06/20 0320  AST 17 14*  ALT 11 11  ALKPHOS 45 42  BILITOT 0.5 1.1  PROT 5.6* 5.8*  ALBUMIN 3.0* 2.9*   No results for input(s): LIPASE, AMYLASE in the last 168 hours. No results for input(s): AMMONIA in the last 168 hours. CBC: Recent Labs  Lab 10/04/20 0846 10/04/20 1646 10/05/20 0411 10/06/20 0320 10/06/20 1523 10/07/20 0243  WBC 10.0  --   --  9.2 9.4 9.7  NEUTROABS 7.9*  --   --   --   --   --   HGB 4.3* 7.5* 7.4* 7.3* 7.9* 7.8*  HCT 15.7* 24.9* 24.3* 24.3* 26.3* 26.1*  MCV 84.0  --   --  85.0 84.6 84.7  PLT 277  --   --  257 276 243   Cardiac Enzymes: No results for input(s): CKTOTAL, CKMB, CKMBINDEX, TROPONINI in the last 168 hours. BNP: BNP (last 3 results) Recent Labs    01/28/20 1116 09/11/20 1427 10/04/20 0846  BNP 893.9* 1,063.3* 1,046.8*    ProBNP (last 3 results) No results for input(s): PROBNP in the last 8760 hours.  CBG: Recent Labs  Lab 10/06/20 0803 10/06/20 1139 10/06/20 1626 10/06/20 2102 10/07/20 0750  GLUCAP 100* 133* 115* 109* 102*       Signed:  Domenic Polite MD.  [facility] 10/07/2020, 10:33 AM

## 2020-10-07 NOTE — Plan of Care (Signed)
  Problem: Education: Goal: Knowledge of General Education information will improve Description: Including pain rating scale, medication(s)/side effects and non-pharmacologic comfort measures Outcome: Adequate for Discharge   

## 2020-10-10 ENCOUNTER — Telehealth: Payer: Self-pay

## 2020-10-10 ENCOUNTER — Other Ambulatory Visit: Payer: Self-pay | Admitting: Nurse Practitioner

## 2020-10-10 DIAGNOSIS — M79641 Pain in right hand: Secondary | ICD-10-CM

## 2020-10-10 DIAGNOSIS — G8929 Other chronic pain: Secondary | ICD-10-CM

## 2020-10-10 NOTE — Telephone Encounter (Signed)
Transition Care Management Unsuccessful Follow-up Telephone Call  Date of discharge and from where:  10/04/2020 [facility]   Attempts:  1st Attempt  Reason for unsuccessful TCM follow-up call:  Left voice message

## 2020-10-10 NOTE — Telephone Encounter (Signed)
Transition Care Management Follow-up Telephone Call Date of discharge and from where: 10/04/2020 [facility]  How have you been since you were released from the hospital? Pt states she feels okay.  Any questions or concerns? No  Items Reviewed: Did the pt receive and understand the discharge instructions provided? Yes  Medications obtained and verified? Yes  Other? Yes  Any new allergies since your discharge? No  Dietary orders reviewed? Yes Do you have support at home? Yes   Home Care and Equipment/Supplies: Were home health services ordered? not applicable If so, what is the name of the agency? N/a   Has the agency set up a time to come to the patient's home? not applicable Were any new equipment or medical supplies ordered?  No What is the name of the medical supply agency? N/a  Were you able to get the supplies/equipment? not applicable Do you have any questions related to the use of the equipment or supplies? No  Functional Questionnaire: (I = Independent and D = Dependent) ADLs: I/D  Bathing/Dressing- I/D  Meal Prep- I/D  Eating- I/D  Maintaining continence- I/D  Transferring/Ambulation- I/D  Managing Meds- I/D  Follow up appointments reviewed:  PCP Hospital f/u appt confirmed? Yes  Scheduled to see Raman Ghumman on 09/13/2020 @ [facility]. [place] f/u appt confirmed? No  Scheduled to see n/a on n/a @ n/a. Are transportation arrangements needed? No  If their condition worsens, is the pt aware to call PCP or go to the Emergency Dept.? Yes Was the patient provided with contact information for the PCP's office or ED? Yes Was to pt encouraged to call back with questions or concerns? Yes

## 2020-10-10 NOTE — Telephone Encounter (Signed)
Transition Care Management Unsuccessful Follow-up Telephone Call  Date of discharge and from where:  10/07/2020[facility]   Attempts:  1st Attempt  Reason for unsuccessful TCM follow-up call:  Left voice message    

## 2020-10-13 ENCOUNTER — Encounter: Payer: Self-pay | Admitting: Nurse Practitioner

## 2020-10-13 ENCOUNTER — Ambulatory Visit (INDEPENDENT_AMBULATORY_CARE_PROVIDER_SITE_OTHER): Payer: 59 | Admitting: Nurse Practitioner

## 2020-10-13 ENCOUNTER — Other Ambulatory Visit: Payer: Self-pay

## 2020-10-13 VITALS — BP 132/62 | HR 88 | Temp 98.4°F | Ht 66.0 in | Wt 270.2 lb

## 2020-10-13 DIAGNOSIS — Z23 Encounter for immunization: Secondary | ICD-10-CM | POA: Diagnosis not present

## 2020-10-13 DIAGNOSIS — I10 Essential (primary) hypertension: Secondary | ICD-10-CM | POA: Diagnosis not present

## 2020-10-13 DIAGNOSIS — I480 Paroxysmal atrial fibrillation: Secondary | ICD-10-CM | POA: Diagnosis not present

## 2020-10-13 DIAGNOSIS — Z720 Tobacco use: Secondary | ICD-10-CM

## 2020-10-13 DIAGNOSIS — D649 Anemia, unspecified: Secondary | ICD-10-CM | POA: Diagnosis not present

## 2020-10-13 DIAGNOSIS — Z6841 Body Mass Index (BMI) 40.0 and over, adult: Secondary | ICD-10-CM

## 2020-10-13 DIAGNOSIS — E782 Mixed hyperlipidemia: Secondary | ICD-10-CM

## 2020-10-13 DIAGNOSIS — R7303 Prediabetes: Secondary | ICD-10-CM

## 2020-10-13 DIAGNOSIS — K259 Gastric ulcer, unspecified as acute or chronic, without hemorrhage or perforation: Secondary | ICD-10-CM

## 2020-10-13 DIAGNOSIS — M1990 Unspecified osteoarthritis, unspecified site: Secondary | ICD-10-CM

## 2020-10-13 NOTE — Progress Notes (Signed)
[employer] as a Education administrator for [employer], NP.,have documented all relevant documentation on the behalf of [employer], NP,as directed by  Bary Castilla, NP while in the presence of Bary Castilla, NP.  This visit occurred during the SARS-CoV-2 public health emergency.  Safety protocols were in place, including screening questions prior to the visit, additional usage of staff PPE, and extensive cleaning of exam room while observing appropriate contact time as indicated for disinfecting solutions.  Subjective:     Patient ID: Alexandra Bell , female    DOB: 1965-11-08 , 55 y.o.   MRN: [id number]   No chief complaint on file.   HPI  Pt presents today for HPFU. She was admitted on 10/04/2020 & discharged on 10/07/2020. She feels good currently, she does have a concern with her stool, runny and dark. She has yet to have a solid bowl movement. There is no blood. She has restarted the apixaban on 10/12/2020. & takes tramadol as needed for severe knee pain.     Past Medical History:  Diagnosis Date   Acute renal failure (HCC)    Arthritis    Asthma    Colitis, eosinophilic 37/62/8315   Common bile duct stone    Eosinophilia 17/61/6073   Eosinophilic enteritis 71/0626   Fatty liver 11/2010   GERD (gastroesophageal reflux disease)    HOCM (hypertrophic obstructive cardiomyopathy) ([place]) 12/10/2019   Hx: UTI (urinary tract infection)    Hypertension    Inguinal hernia    Morbid obesity ([place]) 12/10/2019   NSVT (nonsustained ventricular tachycardia) 03/10/2020   Obesity, Class III, BMI 40-49.9 (morbid obesity) ([place])    Periumbilical hernia      Family History  Problem Relation Age of Onset   Diabetes Mother    Colon cancer Father        ?   Heart attack Father    Diabetes Sister      Current Outpatient Medications:    acetaminophen (TYLENOL) 500 MG tablet, Take 2 tablets (1,000 mg total) by mouth every 6 (six) hours as needed (For pain)., Disp: , Rfl:  0   albuterol (VENTOLIN HFA) 108 (90 Base) MCG/ACT inhaler, Inhale 2 puffs into the lungs every 6 (six) hours as needed. For asthma, Disp: 1 each, Rfl: 3   apixaban (ELIQUIS) 5 MG TABS tablet, Take 1 tablet (5 mg total) by mouth 2 (two) times daily. Restart on Wednesday 10/5, Disp: , Rfl:    dapagliflozin propanediol (FARXIGA) 10 MG TABS tablet, Take 1 tablet (10 mg total) by mouth daily before breakfast., Disp: 30 tablet, Rfl: 3   ferrous sulfate 325 (65 FE) MG EC tablet, Take 1 tablet (325 mg total) by mouth 2 (two) times daily with a meal., Disp: 60 tablet, Rfl: 3   furosemide (LASIX) 80 MG tablet, Take 1 tablet (80 mg total) by mouth daily., Disp: 90 tablet, Rfl: 1   gabapentin (NEURONTIN) 300 MG capsule, Take 1 capsule (300 mg total) by mouth 2 (two) times daily., Disp: , Rfl: 0   Multiple Vitamin (MULTIVITAMIN WITH MINERALS) TABS tablet, Take 1 tablet by mouth daily., Disp: , Rfl:    pantoprazole (PROTONIX) 40 MG tablet, Take 1 tablet (40 mg total) by mouth 2 (two) times daily., Disp: 60 tablet, Rfl: 1   potassium chloride SA (KLOR-CON) 20 MEQ tablet, Take 1 tablet (20 mEq total) by mouth daily., Disp: 30 tablet, Rfl: 0   traMADol (ULTRAM) 50 MG tablet, Take 1 tablet (50 mg total) by mouth every 12 (  twelve) hours as needed for severe pain or moderate pain., Disp: 30 tablet, Rfl: 0   amLODipine (NORVASC) 10 MG tablet, Take 1 tablet (10 mg total) by mouth daily., Disp: 90 tablet, Rfl: 3   carvedilol (COREG) 25 MG tablet, Take 1 tablet (25 mg total) by mouth 2 (two) times daily., Disp: 180 tablet, Rfl: 3   diclofenac Sodium (VOLTAREN) 1 % GEL, APPLY 2 G TOPICALLY FOUR TIMES DAILY. (Patient taking differently: Apply 2 g topically daily as needed (For pain).), Disp: 400 g, Rfl: 0   Magnesium 250 MG TABS, Take 250 mg by mouth daily at 2 PM., Disp: , Rfl:    melatonin 5 MG TABS, Take 5 mg by mouth daily at 2 PM., Disp: , Rfl:    metFORMIN (GLUCOPHAGE) 500 MG tablet, TAKE 1 TABLET BY MOUTH TWICE DAILY  WITH A MEAL (Patient taking differently: Take 500 mg by mouth 2 (two) times daily with a meal.), Disp: 180 tablet, Rfl: 2   No Known Allergies   Review of Systems  Constitutional: Negative.  Negative for chills and fever.  HENT:  Negative for congestion and sinus pain.   Respiratory: Negative.  Negative for cough, choking, shortness of breath and wheezing.   Cardiovascular: Negative.  Negative for chest pain and palpitations.  Musculoskeletal:  Negative for arthralgias and myalgias.  Neurological: Negative.  Negative for weakness and headaches.  Psychiatric/Behavioral: Negative.      Today's Vitals   10/13/20 1534  BP: 132/62  Pulse: 88  Temp: 98.4 F (36.9 C)  Weight: 270 lb 3.2 oz (122.6 kg)  Height: 5\' 6"  (1.676 m)   Body mass index is 43.61 kg/m.  Wt Readings from Last 3 Encounters:  10/13/20 270 lb 3.2 oz (122.6 kg)  10/05/20 275 lb 9.2 oz (125 kg)  09/13/20 276 lb (125.2 kg)    Objective:  Physical Exam Constitutional:      Appearance: Normal appearance. She is obese.  HENT:     Head: Normocephalic and atraumatic.  Cardiovascular:     Rate and Rhythm: Normal rate and regular rhythm.     Pulses: Normal pulses.     Heart sounds: Normal heart sounds. No murmur heard. Pulmonary:     Effort: Pulmonary effort is normal. No respiratory distress.     Breath sounds: Normal breath sounds. No wheezing.  Skin:    Capillary Refill: Capillary refill takes less than 2 seconds.  Neurological:     Mental Status: She is alert.        Assessment And Plan:     1. Symptomatic anemia -Will follow up on CBC and iron studies due to recent ED for anemia and blood loss -Hold on any NSAID use  -DC diclofenac  - CBC no Diff - Iron, TIBC and Ferritin Panel  2. Essential hypertension Chronic, continue meds.  -Limit the intake of processed foods and salt intake. You should increase your intake of green vegetables and fruits. Limit the use of alcohol. Limit fast foods and fried  foods. Avoid high fatty saturated and trans fat foods. Keep yourself hydrated with drinking water. Avoid red meats. Eat lean meats instead. Exercise for atleast 30-45 min for atleast 4-5 times a week.   3. Immunization due - Flu Vaccine QUAD 6+ mos PF IM (Fluarix Quad PF)  4. Prediabetes -Chronic, continue meds  -Continue metformin  -Will check hgb a1c and assess  -advised patient to eat a healthy diet avoiding sweets and sugars.  - Hemoglobin A1c  5.  Mixed hyperlipidemia -Will check and assess.  - Lipid panel  6. Gastric ulcer, unspecified chronicity, unspecified whether gastric ulcer hemorrhage or perforation present - Ambulatory referral to Gastroenterology  7. Tobacco abuse -Encouraged patient to stop smoking. Given the risks on cardiovascular system and overall health.   8. AF (paroxysmal atrial fibrillation) ([place]) -Follow up with cardiologist  -Continue the eliquis   9. Chronic arthritis -Discontinue use of NSAID due to hx of bleeding ulcer  -Use topical cream instead   10. Class 3 severe obesity due to excess calories with serious comorbidity and body mass index (BMI) of 40.0 to 44.9 in adult [facility] -Advised patient on a healthy diet including avoiding fast food and red meats. Increase the intake of lean meats including grilled chicken and [place].  Drink a lot of water. Decrease intake of fatty foods. Exercise for 30-45 min. 4-5 a week to decrease the risk of cardiac event.    The patient was encouraged to call or send a message through [place] for any questions or concerns.   Follow up: if symptoms persist or do not get better.   Staying healthy and adopting a healthy lifestyle for your overall health is important. You should eat 7 or more servings of fruits and vegetables per day. You should drink plenty of water to keep yourself hydrated and your kidneys healthy. This includes about 65-80+ fluid ounces of water. Limit your intake of animal fats especially for elevated  cholesterol. Avoid highly processed food and limit your salt intake if you have hypertension. Avoid foods high in saturated/Trans fats. Along with a healthy diet it is also very important to maintain time for yourself to maintain a healthy mental health with low stress levels. You should get atleast 150 min of moderate intensity exercise weekly for a healthy heart. Along with eating right and exercising, aim for at least 7-9 hours of sleep daily.  Eat more whole grains which includes barley, wheat berries, oats, brown rice and whole wheat pasta. Use healthy plant oils which include olive, soy, corn, sunflower and peanut. Limit your caffeine and sugary drinks. Limit your intake of fast foods. Limit milk and dairy products to one or two daily servings.   Side effects and appropriate use of all the medication(s) were discussed with the patient today. Patient advised to use the medication(s) as directed by their healthcare provider. The patient was encouraged to read, review, and understand all associated package inserts and contact our office with any questions or concerns. The patient accepts the risks of the treatment plan and had an opportunity to ask questions.   Patient was given opportunity to ask questions. Patient verbalized understanding of the plan and was able to repeat key elements of the plan. All questions were answered to their satisfaction.  Raman Makaylen Thieme, DNP   I, Raman Saniya Tranchina have reviewed all documentation for this visit. The documentation on 10/13/20 for the exam, diagnosis, procedures, and orders are all accurate and complete.    IF YOU HAVE BEEN REFERRED TO A SPECIALIST, IT MAY TAKE 1-2 WEEKS TO SCHEDULE/PROCESS THE REFERRAL. IF YOU HAVE NOT HEARD FROM US/SPECIALIST IN TWO WEEKS, PLEASE GIVE [place] A CALL AT [phone].   THE PATIENT IS ENCOURAGED TO PRACTICE SOCIAL DISTANCING DUE TO THE COVID-19 PANDEMIC.

## 2020-10-14 LAB — CBC
Hematocrit: 29.8 % — ABNORMAL LOW (ref 34.0–46.6)
Hemoglobin: 8.9 g/dL — ABNORMAL LOW (ref 11.1–15.9)
MCH: 25.8 pg — ABNORMAL LOW (ref 26.6–33.0)
MCHC: 29.9 g/dL — ABNORMAL LOW (ref 31.5–35.7)
MCV: 86 fL (ref 79–97)
Platelets: 205 10*3/uL (ref 150–450)
RBC: 3.45 x10E6/uL — ABNORMAL LOW (ref 3.77–5.28)
RDW: 18.2 % — ABNORMAL HIGH (ref 11.7–15.4)
WBC: 8.6 10*3/uL (ref 3.4–10.8)

## 2020-10-14 LAB — HEMOGLOBIN A1C
Est. average glucose Bld gHb Est-mCnc: 100 mg/dL
Hgb A1c MFr Bld: 5.1 % (ref 4.8–5.6)

## 2020-10-14 LAB — LIPID PANEL
Chol/HDL Ratio: 2.8 ratio (ref 0.0–4.4)
Cholesterol, Total: 143 mg/dL (ref 100–199)
HDL: 52 mg/dL (ref >39)
LDL Chol Calc (NIH): 69 mg/dL (ref 0–99)
Triglycerides: 122 mg/dL (ref 0–149)
VLDL Cholesterol Cal: 22 mg/dL (ref 5–40)

## 2020-10-14 LAB — IRON,TIBC AND FERRITIN PANEL
Ferritin: 87 ng/mL (ref 15–150)
Iron Saturation: 20 % (ref 15–55)
Iron: 79 ug/dL (ref 27–159)
Total Iron Binding Capacity: 405 ug/dL (ref 250–450)
UIBC: 326 ug/dL (ref 131–425)

## 2020-10-17 ENCOUNTER — Other Ambulatory Visit: Payer: Self-pay | Admitting: Nurse Practitioner

## 2020-10-17 DIAGNOSIS — M79641 Pain in right hand: Secondary | ICD-10-CM

## 2020-10-17 DIAGNOSIS — M545 Low back pain, unspecified: Secondary | ICD-10-CM

## 2020-10-17 DIAGNOSIS — G8929 Other chronic pain: Secondary | ICD-10-CM

## 2020-10-19 ENCOUNTER — Other Ambulatory Visit: Payer: Self-pay

## 2020-10-19 DIAGNOSIS — M545 Low back pain, unspecified: Secondary | ICD-10-CM

## 2020-10-19 DIAGNOSIS — M79642 Pain in left hand: Secondary | ICD-10-CM

## 2020-10-19 DIAGNOSIS — G8929 Other chronic pain: Secondary | ICD-10-CM

## 2020-10-19 DIAGNOSIS — M79641 Pain in right hand: Secondary | ICD-10-CM

## 2020-10-19 MED ORDER — GABAPENTIN 300 MG PO CAPS: 300.0000 mg | ORAL_CAPSULE | Freq: Two times a day (BID) | ORAL | 0 refills | Status: DC

## 2020-11-03 ENCOUNTER — Other Ambulatory Visit: Payer: Self-pay

## 2020-11-03 ENCOUNTER — Encounter: Payer: Self-pay | Admitting: Nurse Practitioner

## 2020-11-03 ENCOUNTER — Ambulatory Visit (INDEPENDENT_AMBULATORY_CARE_PROVIDER_SITE_OTHER): Payer: 59 | Admitting: Nurse Practitioner

## 2020-11-03 VITALS — BP 128/70 | HR 86 | Temp 98.4°F | Ht 66.0 in | Wt 253.6 lb

## 2020-11-03 DIAGNOSIS — Z6841 Body Mass Index (BMI) 40.0 and over, adult: Secondary | ICD-10-CM

## 2020-11-03 DIAGNOSIS — D649 Anemia, unspecified: Secondary | ICD-10-CM | POA: Diagnosis not present

## 2020-11-03 DIAGNOSIS — Z23 Encounter for immunization: Secondary | ICD-10-CM

## 2020-11-03 DIAGNOSIS — E662 Morbid (severe) obesity with alveolar hypoventilation: Secondary | ICD-10-CM

## 2020-11-03 DIAGNOSIS — I10 Essential (primary) hypertension: Secondary | ICD-10-CM | POA: Diagnosis not present

## 2020-11-03 LAB — POCT UA - MICROALBUMIN
Creatinine, POC: 300 mg/dL
Microalbumin Ur, POC: 80 mg/L

## 2020-11-03 LAB — CBC
Hematocrit: 30.3 % — ABNORMAL LOW (ref 34.0–46.6)
Hemoglobin: 9.3 g/dL — ABNORMAL LOW (ref 11.1–15.9)
MCH: 28.4 pg (ref 26.6–33.0)
MCHC: 30.7 g/dL — ABNORMAL LOW (ref 31.5–35.7)
MCV: 93 fL (ref 79–97)
Platelets: 304 10*3/uL (ref 150–450)
RBC: 3.27 x10E6/uL — ABNORMAL LOW (ref 3.77–5.28)
RDW: 18.2 % — ABNORMAL HIGH (ref 11.7–15.4)
WBC: 8.1 10*3/uL (ref 3.4–10.8)

## 2020-11-03 MED ORDER — POTASSIUM CHLORIDE CRYS ER 20 MEQ PO TBCR: 20.0000 meq | EXTENDED_RELEASE_TABLET | Freq: Every day | ORAL | 2 refills | Status: DC

## 2020-11-03 MED ORDER — FERROUS SULFATE 325 (65 FE) MG PO TBEC: 325.0000 mg | DELAYED_RELEASE_TABLET | Freq: Two times a day (BID) | ORAL | 2 refills | Status: AC

## 2020-11-03 MED ORDER — PANTOPRAZOLE SODIUM 40 MG PO TBEC: 40.0000 mg | DELAYED_RELEASE_TABLET | Freq: Two times a day (BID) | ORAL | 2 refills | Status: DC

## 2020-11-03 MED ORDER — TRAMADOL HCL 50 MG PO TABS: 50.0000 mg | ORAL_TABLET | Freq: Two times a day (BID) | ORAL | 0 refills | Status: DC | PRN

## 2020-11-03 NOTE — Progress Notes (Signed)
I,Katawbba Wiggins,acting as a Education administrator for [employer], FNP.,have documented all relevant documentation on the behalf of [name], FNP,as directed by  [name], FNP while in the presence of [name], [place].   This visit occurred during the SARS-CoV-2 public health emergency.  Safety protocols were in place, including screening questions prior to the visit, additional usage of staff PPE, and extensive cleaning of exam room while observing appropriate contact time as indicated for disinfecting solutions.  Subjective:     Patient ID: Alexandra Bell , female    DOB: 1965-04-01 , 55 y.o.   MRN: [id number]   Chief Complaint  Patient presents with   Hypertension    HPI  Patient here for a blood pressure f/u. Overall she is doing well.   Hypertension This is a chronic problem. The current episode started more than 1 year ago. The problem is uncontrolled. Pertinent negatives include no anxiety, chest pain, headaches or palpitations. Risk factors for coronary artery disease include sedentary lifestyle and obesity. Past treatments include ACE inhibitors and diuretics. There are no compliance problems.  There is no history of angina or kidney disease. There is no history of chronic renal disease.    Past Medical History:  Diagnosis Date   Acute renal failure (HCC)    Arthritis    Asthma    Colitis, eosinophilic 51/89/8421   Common bile duct stone    Eosinophilia 03/19/8116   Eosinophilic enteritis 86/7737   Fatty liver 11/2010   GERD (gastroesophageal reflux disease)    HOCM (hypertrophic obstructive cardiomyopathy) ([place]) 12/10/2019   Hx: UTI (urinary tract infection)    Hypertension    Inguinal hernia    Morbid obesity ([place]) 12/10/2019   NSVT (nonsustained ventricular tachycardia) 03/10/2020   Obesity, Class III, BMI 40-49.9 (morbid obesity) ([place])    Periumbilical hernia      Family History  Problem Relation Age of Onset   Diabetes Mother    Colon cancer Father        ?    Heart attack Father    Diabetes Sister      Current Outpatient Medications:    acetaminophen (TYLENOL) 500 MG tablet, Take 2 tablets (1,000 mg total) by mouth every 6 (six) hours as needed (For pain)., Disp: , Rfl: 0   albuterol (VENTOLIN HFA) 108 (90 Base) MCG/ACT inhaler, Inhale 2 puffs into the lungs every 6 (six) hours as needed. For asthma, Disp: 1 each, Rfl: 3   amLODipine (NORVASC) 10 MG tablet, Take 1 tablet (10 mg total) by mouth daily., Disp: 90 tablet, Rfl: 3   apixaban (ELIQUIS) 5 MG TABS tablet, Take 1 tablet (5 mg total) by mouth 2 (two) times daily. Restart on Wednesday 10/5, Disp: , Rfl:    dapagliflozin propanediol (FARXIGA) 10 MG TABS tablet, Take 1 tablet (10 mg total) by mouth daily before breakfast., Disp: 30 tablet, Rfl: 3   diclofenac Sodium (VOLTAREN) 1 % GEL, APPLY 2 G TOPICALLY FOUR TIMES DAILY. (Patient taking differently: Apply 2 g topically daily as needed (For pain).), Disp: 400 g, Rfl: 0   ferrous sulfate 325 (65 FE) MG EC tablet, Take 1 tablet (325 mg total) by mouth 2 (two) times daily with a meal., Disp: 180 tablet, Rfl: 2   Magnesium 250 MG TABS, Take 250 mg by mouth daily at 2 PM., Disp: , Rfl:    melatonin 5 MG TABS, Take 5 mg by mouth daily at 2 PM., Disp: , Rfl:    metFORMIN (GLUCOPHAGE) 500  MG tablet, TAKE 1 TABLET BY MOUTH TWICE DAILY WITH A MEAL (Patient taking differently: Take 500 mg by mouth 2 (two) times daily with a meal.), Disp: 180 tablet, Rfl: 2   Multiple Vitamin (MULTIVITAMIN WITH MINERALS) TABS tablet, Take 1 tablet by mouth daily., Disp: , Rfl:    pantoprazole (PROTONIX) 40 MG tablet, Take 1 tablet (40 mg total) by mouth 2 (two) times daily., Disp: 180 tablet, Rfl: 2   potassium chloride SA (KLOR-CON) 20 MEQ tablet, Take 1 tablet (20 mEq total) by mouth daily., Disp: 90 tablet, Rfl: 2   carvedilol (COREG) 25 MG tablet, Take 1 tablet (25 mg total) by mouth 2 (two) times daily., Disp: 180 tablet, Rfl: 3   furosemide (LASIX) 80 MG tablet, Take  1 tablet by mouth once daily, Disp: 90 tablet, Rfl: 1   gabapentin (NEURONTIN) 300 MG capsule, Take 1 capsule by mouth twice daily, Disp: 60 capsule, Rfl: 0   traMADol (ULTRAM) 50 MG tablet, Take 1 tablet (50 mg total) by mouth every 12 (twelve) hours as needed for severe pain or moderate pain., Disp: 30 tablet, Rfl: 0   No Known Allergies   Review of Systems  Constitutional: Negative.   Respiratory: Negative.    Cardiovascular: Negative.  Negative for chest pain and palpitations.  Gastrointestinal: Negative.   Neurological:  Negative for headaches.  Psychiatric/Behavioral: Negative.    All other systems reviewed and are negative.   Today's Vitals   11/03/20 0944  BP: 128/70  Pulse: 86  Temp: 98.4 F (36.9 C)  Weight: 253 lb 9.6 oz (115 kg)  Height: 5\' 6"  (1.676 m)  PainSc: 7   PainLoc: Knee   Body mass index is 40.93 kg/m.  Wt Readings from Last 3 Encounters:  11/03/20 253 lb 9.6 oz (115 kg)  10/13/20 270 lb 3.2 oz (122.6 kg)  10/05/20 275 lb 9.2 oz (125 kg)    BP Readings from Last 3 Encounters:  11/03/20 128/70  10/13/20 132/62  10/07/20 (!) 158/68    Objective:  Physical Exam Vitals reviewed.  Constitutional:      General: She is not in acute distress.    Appearance: Normal appearance. She is well-developed. She is obese.  Cardiovascular:     Rate and Rhythm: Normal rate and regular rhythm.     Pulses: Normal pulses.     Heart sounds: Normal heart sounds. No murmur heard. Pulmonary:     Effort: Pulmonary effort is normal. No respiratory distress.     Breath sounds: Normal breath sounds. No wheezing.  Musculoskeletal:        General: No tenderness. Normal range of motion.     Comments: Using cane for ambulation  Skin:    General: Skin is warm and dry.     Capillary Refill: Capillary refill takes less than 2 seconds.  Neurological:     General: No focal deficit present.     Mental Status: She is alert and oriented to person, place, and time.     Cranial  Nerves: No cranial nerve deficit.     Motor: No weakness.  Psychiatric:        Mood and Affect: Mood normal.        Behavior: Behavior normal.        Thought Content: Thought content normal.        Judgment: Judgment normal.        Assessment And Plan:     1. Essential hypertension Comments: Blood pressure is well controlled, continue  current medications - CBC  2. Low hemoglobin Comments: Will recheck her hgb since being in the hospital about one month ago, she is feeling better. she is to follow up with GI - CBC - POCT UA - Microalbumin  3. Class 3 obesity with alveolar hypoventilation and body mass index (BMI) of 45.0 to 49.9 in adult, unspecified whether serious comorbidity present (HCC) Chronic Discussed healthy diet and regular exercise options  Encouraged to exercise at least 150 minutes per week with 2 days of strength training as tolerated  4. Need for vaccination Given in office - Varicella-zoster vaccine IM (Shingrix)    Patient was given opportunity to ask questions. Patient verbalized understanding of the plan and was able to repeat key elements of the plan. All questions were answered to their satisfaction.  [name], FNP   I, [name], FNP, have reviewed all documentation for this visit. The documentation on 11/23/20 for the exam, diagnosis, procedures, and orders are all accurate and complete.   IF YOU HAVE BEEN REFERRED TO A SPECIALIST, IT MAY TAKE 1-2 WEEKS TO SCHEDULE/PROCESS THE REFERRAL. IF YOU HAVE NOT HEARD FROM US/SPECIALIST IN TWO WEEKS, PLEASE GIVE [place] A CALL AT [phone].   THE PATIENT IS ENCOURAGED TO PRACTICE SOCIAL DISTANCING DUE TO THE COVID-19 PANDEMIC.

## 2020-11-03 NOTE — Patient Instructions (Addendum)
Cooking With Less Salt Cooking with less salt is one way to reduce the amount of sodium you get from food. Sodium is one of the elements that make up salt. It is found naturally in foods and is also added to certain foods. Depending on your condition and overall health, your health care provider or dietitian may recommend that you reduce your sodium intake. Most people should have less than 2,300 milligrams (mg) of sodium each day. If you have high blood pressure (hypertension), you may need to limit your sodium to 1,500 mg each day. Follow the tipsbelow to help reduce your sodium intake. What are tips for eating less sodium? Reading food labels  Check the food label before buying or using packaged ingredients. Always check the label for the serving size and sodium content. Look for products with no more than 140 mg of sodium in one serving. Check the % Daily Value column to see what percent of the daily recommended amount of sodium is provided in one serving of the product. Foods with 5% or less in this column are considered low in sodium. Foods with 20% or higher are considered high in sodium. Do not choose foods with salt as one of the first three ingredients on the ingredients list. If salt is one of the first three ingredients, it usually means the item is high in sodium.  Shopping Buy sodium-free or low-sodium products. Look for the following words on food labels: Low-sodium. Sodium-free. Reduced-sodium. No salt added. Unsalted. Always check the sodium content even if foods are labeled as low-sodium or no salt added. Buy fresh foods. Cooking Use herbs, seasonings without salt, and spices as substitutes for salt. Use sodium-free baking soda when baking. Grill, braise, or roast foods to add flavor with less salt. Avoid adding salt to pasta, rice, or hot cereals. Drain and rinse canned vegetables, beans, and meat before use. Avoid adding salt when cooking sweets and desserts. Cook with  low-sodium ingredients. What foods are high in sodium? Vegetables Regular canned vegetables (not low-sodium or reduced-sodium). Sauerkraut, pickled vegetables, and relishes. Olives. French fries. Onion rings. Regular canned tomato sauce and paste. Regular tomato and vegetable juice. Frozenvegetables in sauces. Grains Instant hot cereals. Bread stuffing, pancake, and biscuit mixes. Croutons. Seasoned rice or pasta mixes. Noodle soup cups. Boxed or frozen macaroni and cheese. Regular salted crackers. Self-rising flour. Rolls. Bagels. Flourtortillas and wraps. Meats and other proteins Meat or fish that is salted, canned, smoked, cured, spiced, or pickled. This includes bacon, ham, sausages, hot dogs, corned beef, chipped beef, meat loaves, salt pork, jerky, pickled herring, anchovies, regular canned tuna, andsardines. Salted nuts. Dairy Processed cheese and cheese spreads. Cheese curds. Blue cheese. Feta cheese.String cheese. Regular cottage cheese. Buttermilk. Canned milk. The items listed above may not be a complete list of foods high in sodium. Actual amounts of sodium may be different depending on processing. Contact a dietitian for more information. What foods are low in sodium? Fruits Fresh, frozen, or canned fruit with no sauce added. Fruit juice. Vegetables Fresh or frozen vegetables with no sauce added. "No salt added" canned vegetables. "No salt added" tomato sauce and paste. Low-sodium orreduced-sodium tomato and vegetable juice. Grains Noodles, pasta, quinoa, rice. Shredded or puffed wheat or puffed rice. Regular or quick oats (not instant). Low-sodium crackers. Low-sodium bread. Whole-grainbread and whole-grain pasta. Unsalted popcorn. Meats and other proteins Fresh or frozen whole meats, poultry (not injected with sodium), and fish with no sauce added. Unsalted nuts. Dried peas, beans, and   lentils without added salt. Unsalted canned beans. Eggs. Unsalted nut butters. Low-sodium canned  tunaor chicken. Dairy Milk. Soy milk. Yogurt. Low-sodium cheeses, such as Swiss, Monterey Jack, mozzarella, and ricotta. Sherbet or ice cream (keep to  cup per serving).Cream cheese. Fats and oils Unsalted butter or margarine. Other foods Homemade pudding. Sodium-free baking soda and baking powder. Herbs and spices.Low-sodium seasoning mixes. Beverages Coffee and tea. Carbonated beverages. The items listed above may not be a complete list of foods low in sodium. Actual amounts of sodium may be different depending on processing. Contact a dietitian for more information. What are some salt alternatives when cooking? The following are herbs, seasonings, and spices that can be used instead of salt to flavor your food. Herbs should be fresh or dried. Do not choose packaged mixes. Next to the name of the herb, spice, or seasoning aresome examples of foods you can pair it with. Herbs [place] leaves - Soups, meat and vegetable dishes, and spaghetti sauce. Basil - Italian dishes, soups, pasta, and fish dishes. Cilantro - Meat, poultry, and vegetable dishes. Chili powder - Marinades and Mexican dishes. Chives - Salad dressings and potato dishes. Cumin - Mexican dishes, couscous, and meat dishes. Dill - Fish dishes, sauces, and salads. Fennel - Meat and vegetable dishes, breads, and cookies. Garlic (do not use garlic salt) - Italian dishes, meat dishes, salad dressings, and sauces. Marjoram - Soups, potato dishes, and meat dishes. Oregano - Pizza and spaghetti sauce. Parsley - Salads, soups, pasta, and meat dishes. Rosemary - Italian dishes, salad dressings, soups, and red meats. Saffron - Fish dishes, pasta, and some poultry dishes. Sage - Stuffings and sauces. Tarragon - Fish and poultry dishes. Thyme - Stuffing, meat, and fish dishes. Seasonings Lemon juice - Fish dishes, poultry dishes, vegetables, and salads. Vinegar - Salad dressings, vegetables, and fish dishes. Spices Cinnamon - Sweet  dishes, such as cakes, cookies, and puddings. Cloves - Gingerbread, puddings, and marinades for meats. Curry - Vegetable dishes, fish and poultry dishes, and stir-fry dishes. Ginger - Vegetable dishes, fish dishes, and stir-fry dishes. Nutmeg - Pasta, vegetables, poultry, fish dishes, and custard. Summary Cooking with less salt is one way to reduce the amount of sodium that you get from food. Buy sodium-free or low-sodium products. Check the food label before using or buying packaged ingredients. Use herbs, seasonings without salt, and spices as substitutes for salt in foods. This information is not intended to replace advice given to you by your health care provider. Make sure you discuss any questions you have with your healthcare provider. Document Revised: 12/17/2018 Document Reviewed: 12/17/2018 Elsevier Patient Education  2022 [employer]  

## 2020-11-07 ENCOUNTER — Other Ambulatory Visit: Payer: Self-pay | Admitting: Cardiovascular Disease

## 2020-11-08 NOTE — Telephone Encounter (Signed)
Rx(s) sent to pharmacy electronically.  

## 2020-11-17 ENCOUNTER — Other Ambulatory Visit: Payer: Self-pay | Admitting: Nurse Practitioner

## 2020-11-17 DIAGNOSIS — M545 Low back pain, unspecified: Secondary | ICD-10-CM

## 2020-11-17 DIAGNOSIS — M79642 Pain in left hand: Secondary | ICD-10-CM

## 2020-12-15 ENCOUNTER — Other Ambulatory Visit: Payer: Self-pay | Admitting: Nurse Practitioner

## 2020-12-15 DIAGNOSIS — M79641 Pain in right hand: Secondary | ICD-10-CM

## 2020-12-15 DIAGNOSIS — G8929 Other chronic pain: Secondary | ICD-10-CM

## 2020-12-29 ENCOUNTER — Ambulatory Visit (INDEPENDENT_AMBULATORY_CARE_PROVIDER_SITE_OTHER): Payer: 59

## 2020-12-29 DIAGNOSIS — I421 Obstructive hypertrophic cardiomyopathy: Secondary | ICD-10-CM

## 2020-12-29 LAB — CUP PACEART REMOTE DEVICE CHECK
Battery Remaining Longevity: 156 mo
Battery Remaining Percentage: 100 %
Brady Statistic RA Percent Paced: 11 %
Brady Statistic RV Percent Paced: 0 %
Date Time Interrogation Session: 20221222005200
HighPow Impedance: 69 Ohm
Implantable Lead Implant Date: 20220318
Implantable Lead Implant Date: 20220318
Implantable Lead Location: 753859
Implantable Lead Location: 753860
Implantable Lead Model: 673
Implantable Lead Model: 7841
Implantable Lead Serial Number: 1122351
Implantable Lead Serial Number: 158410
Implantable Pulse Generator Implant Date: 20220318
Lead Channel Impedance Value: 374 Ohm
Lead Channel Impedance Value: 681 Ohm
Lead Channel Setting Pacing Amplitude: 2 V
Lead Channel Setting Pacing Amplitude: 2.5 V
Lead Channel Setting Pacing Pulse Width: 0.4 ms
Lead Channel Setting Sensing Sensitivity: 0.5 mV
Pulse Gen Serial Number: 593724

## 2021-01-04 ENCOUNTER — Other Ambulatory Visit: Payer: Self-pay | Admitting: Nurse Practitioner

## 2021-01-04 DIAGNOSIS — M545 Low back pain, unspecified: Secondary | ICD-10-CM

## 2021-01-04 DIAGNOSIS — M25561 Pain in right knee: Secondary | ICD-10-CM

## 2021-01-10 NOTE — Progress Notes (Signed)
Remote ICD transmission.   

## 2021-01-16 ENCOUNTER — Other Ambulatory Visit: Payer: Self-pay | Admitting: Nurse Practitioner

## 2021-01-16 DIAGNOSIS — M545 Low back pain, unspecified: Secondary | ICD-10-CM

## 2021-01-16 DIAGNOSIS — M79641 Pain in right hand: Secondary | ICD-10-CM

## 2021-01-29 ENCOUNTER — Other Ambulatory Visit: Payer: Self-pay | Admitting: Nurse Practitioner

## 2021-02-09 ENCOUNTER — Encounter: Payer: Self-pay | Admitting: Nurse Practitioner

## 2021-02-09 ENCOUNTER — Other Ambulatory Visit: Payer: Self-pay

## 2021-02-09 ENCOUNTER — Ambulatory Visit (INDEPENDENT_AMBULATORY_CARE_PROVIDER_SITE_OTHER): Payer: 59 | Admitting: Nurse Practitioner

## 2021-02-09 VITALS — BP 132/68 | HR 84 | Temp 98.3°F | Ht 64.8 in | Wt 246.4 lb

## 2021-02-09 DIAGNOSIS — D649 Anemia, unspecified: Secondary | ICD-10-CM | POA: Diagnosis not present

## 2021-02-09 DIAGNOSIS — G8929 Other chronic pain: Secondary | ICD-10-CM

## 2021-02-09 DIAGNOSIS — E782 Mixed hyperlipidemia: Secondary | ICD-10-CM

## 2021-02-09 DIAGNOSIS — I10 Essential (primary) hypertension: Secondary | ICD-10-CM | POA: Diagnosis not present

## 2021-02-09 DIAGNOSIS — Z9989 Dependence on other enabling machines and devices: Secondary | ICD-10-CM

## 2021-02-09 DIAGNOSIS — Z23 Encounter for immunization: Secondary | ICD-10-CM

## 2021-02-09 DIAGNOSIS — K269 Duodenal ulcer, unspecified as acute or chronic, without hemorrhage or perforation: Secondary | ICD-10-CM

## 2021-02-09 DIAGNOSIS — M79642 Pain in left hand: Secondary | ICD-10-CM

## 2021-02-09 DIAGNOSIS — M79641 Pain in right hand: Secondary | ICD-10-CM

## 2021-02-09 DIAGNOSIS — R600 Localized edema: Secondary | ICD-10-CM

## 2021-02-09 DIAGNOSIS — Z8719 Personal history of other diseases of the digestive system: Secondary | ICD-10-CM

## 2021-02-09 DIAGNOSIS — M545 Low back pain, unspecified: Secondary | ICD-10-CM

## 2021-02-09 DIAGNOSIS — F17200 Nicotine dependence, unspecified, uncomplicated: Secondary | ICD-10-CM

## 2021-02-09 MED ORDER — GABAPENTIN 300 MG PO CAPS: 300.0000 mg | ORAL_CAPSULE | Freq: Two times a day (BID) | ORAL | 1 refills | Status: DC

## 2021-02-09 MED ORDER — DAPAGLIFLOZIN PROPANEDIOL 10 MG PO TABS: 10.0000 mg | ORAL_TABLET | Freq: Every day | ORAL | 1 refills | Status: DC

## 2021-02-09 NOTE — Progress Notes (Signed)
I,Alexandra Bell,acting as a Education administrator for [employer], FNP.,have documented all relevant documentation on the behalf of Alexandra Brine, FNP,as directed by  Alexandra Brine, FNP while in the presence of Alexandra Bell, [place].  This visit occurred during the SARS-CoV-2 public health emergency.  Safety protocols were in place, including screening questions prior to the visit, additional usage of staff PPE, and extensive cleaning of exam room while observing appropriate contact time as indicated for disinfecting solutions.  Subjective:     Patient ID: Alexandra Bell , female    DOB: December 04, 1965 , 56 y.o.   MRN: [id number]   Chief Complaint  Patient presents with   Hyperlipidemia    HPI  Patient here for a blood pressure f/u. Overall she is doing well.   Hyperlipidemia She has no history of chronic renal disease. Pertinent negatives include no chest pain.  Hypertension This is a chronic problem. The current episode started more than 1 year ago. The problem is uncontrolled. Pertinent negatives include no anxiety, chest pain, headaches or palpitations. Risk factors for coronary artery disease include sedentary lifestyle and obesity. Past treatments include ACE inhibitors and diuretics. There are no compliance problems.  There is no history of angina or kidney disease. There is no history of chronic renal disease.    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   Heart attack Father    Diabetes Sister      Current Outpatient Medications:    acetaminophen (TYLENOL) 500 MG tablet, Take 2 tablets (1,000 mg total) by mouth every 6 (six) hours as needed (For pain)., Disp: , Rfl: 0   albuterol (VENTOLIN HFA) 108 (90 Base) MCG/ACT inhaler, Inhale 2 puffs into the lungs every 6 (six) hours as needed. For asthma, Disp: 1 each, Rfl: 3   amLODipine (NORVASC) 10 MG tablet, Take 1 tablet (10 mg total) by mouth daily., Disp: 90 tablet, Rfl: 3   apixaban (ELIQUIS) 5 MG TABS tablet, Take 1 tablet (5 mg total) by mouth 2 (two) times daily. Restart on Wednesday 10/5, Disp: , Rfl:    carvedilol (COREG) 25 MG tablet, Take 1 tablet (25 mg total) by mouth 2 (two) times daily., Disp: 180 tablet, Rfl: 3   diclofenac Sodium (VOLTAREN) 1 % GEL, Apply 2 g topically daily as needed (For pain)., Disp: 50 g, Rfl: 1   ferrous sulfate 325 (65 FE) MG EC tablet, Take 1 tablet (325 mg total) by mouth 2 (two) times daily with a meal., Disp: 180 tablet, Rfl: 2   furosemide (LASIX) 80 MG tablet, Take 1 tablet by mouth once daily, Disp: 90 tablet, Rfl: 1   Magnesium 250 MG TABS, Take  250 mg by mouth daily at 2 PM., Disp: , Rfl:    melatonin 5 MG TABS, Take 5 mg by mouth daily at 2 PM., Disp: , Rfl:    metFORMIN (GLUCOPHAGE) 500 MG tablet, TAKE 1 TABLET BY MOUTH TWICE DAILY WITH A MEAL (Patient taking differently: Take 500 mg by mouth 2 (two) times daily with a meal.), Disp: 180 tablet, Rfl: 2   Multiple Vitamin (MULTIVITAMIN WITH MINERALS) TABS tablet, Take 1 tablet by mouth daily., Disp: , Rfl:    pantoprazole (PROTONIX) 40 MG tablet, Take 1 tablet (40 mg total) by mouth 2 (two) times daily., Disp: 180 tablet, Rfl: 2   potassium chloride SA (KLOR-CON) 20 MEQ tablet, Take 1 tablet (20 mEq total) by mouth daily., Disp:  90 tablet, Rfl: 2   traMADol (ULTRAM) 50 MG tablet, TAKE 1 TABLET BY MOUTH EVERY 12 HOURS AS NEEDED FOR  SEVERE  PAIN  OR  MODERATE  PAIN, Disp: 30 tablet, Rfl: 0   dapagliflozin propanediol (FARXIGA) 10 MG TABS tablet, Take 1 tablet (10 mg total) by mouth daily before breakfast., Disp: 90 tablet, Rfl: 1   gabapentin (NEURONTIN) 300 MG capsule, Take 1 capsule (300 mg total) by mouth 2 (two) times daily., Disp: 180 capsule, Rfl: 1   No Known Allergies   Review of Systems  Constitutional: Negative.   Respiratory: Negative.    Cardiovascular: Negative.  Negative for chest pain and palpitations.  Gastrointestinal: Negative.   Neurological: Negative.  Negative for headaches.    Today's Vitals   02/09/21 1104  BP: 132/68  Pulse: 84  Temp: 98.3 F (36.8 C)  TempSrc: Oral  Weight: 246 lb 6.4 oz (111.8 kg)  Height: 5' 4.8" (1.646 m)   Body mass index is 41.26 kg/m.  Wt Readings from Last 3 Encounters:  02/09/21 246 lb 6.4 oz (111.8 kg)  11/03/20 253 lb 9.6 oz (115 kg)  10/13/20 270 lb 3.2 oz (122.6 kg)    Objective:  Physical Exam Vitals reviewed.  Constitutional:      General: She is not in acute distress.    Appearance: Normal appearance. She is well-developed. She is obese.  Cardiovascular:     Rate and Rhythm: Normal rate and regular rhythm.     Pulses:          Dorsalis pedis pulses are 1+ on the right side and 1+ on the left side.     Heart sounds: Normal heart sounds. No murmur heard. Pulmonary:     Effort: Pulmonary effort is normal. No respiratory distress.     Breath sounds: Normal breath sounds. No wheezing.  Musculoskeletal:        General: No tenderness. Normal range of motion.     Right lower leg: Edema (trace) present.     Left lower leg: Edema (trace) present.     Comments: Using cane for ambulation  Skin:    General: Skin is warm and dry.     Capillary Refill: Capillary refill takes less than 2 seconds.     Comments: She has a dark area to her right lower  extremity.   Neurological:     General: No focal deficit present.     Mental Status: She is alert and oriented to person, place, and time.     Cranial Nerves: No cranial nerve deficit.     Motor: No weakness.  Psychiatric:        Mood and Affect: Mood normal.        Behavior:  Behavior normal.        Thought Content: Thought content normal.        Judgment: Judgment normal.        Assessment And Plan:     1. Essential hypertension Comments: Blood pressure is controlled, continue current medications. - BMP8+EGFR - Lipid panel  2. Mixed hyperlipidemia Comments: Stable, continue statin as tolerated.  - Lipid panel  3. Low hemoglobin Comments: This has improved, however will refer to GI for evaluation as she had a GI bleed and has not followed up with GI - Ambulatory referral to Gastroenterology  4. Duodenal ulcer - Ambulatory referral to Gastroenterology  5. History of GI bleed - Ambulatory referral to Gastroenterology  6. Bilateral hand pain - gabapentin (NEURONTIN) 300 MG capsule; Take 1 capsule (300 mg total) by mouth 2 (two) times daily.  Dispense: 180 capsule; Refill: 1  7. Chronic low back pain without sciatica, unspecified back pain laterality - gabapentin (NEURONTIN) 300 MG capsule; Take 1 capsule (300 mg total) by mouth 2 (two) times daily.  Dispense: 180 capsule; Refill: 1  8. Bilateral lower extremity edema Comments: She has had venous doppler with reflux which were normal however continues with swelling and had ulcer likely vascular to right leg. Diabetic foot exam done.  - Ambulatory referral to Vascular Surgery  9. Tobacco use disorder Comments: Discussed importance of quitting smoking and the risk of vascular issues.      Patient was given opportunity to ask questions. Patient verbalized understanding of the plan and was able to repeat key elements of the plan. All questions were answered to their satisfaction.  Alexandra Brine, FNP   I, Alexandra Brine, FNP,  have reviewed all documentation for this visit. The documentation on 02/09/21 for the exam, diagnosis, procedures, and orders are all accurate and complete.   IF YOU HAVE BEEN REFERRED TO A SPECIALIST, IT MAY TAKE 1-2 WEEKS TO SCHEDULE/PROCESS THE REFERRAL. IF YOU HAVE NOT HEARD FROM US/SPECIALIST IN TWO WEEKS, PLEASE GIVE [place] A CALL AT [phone].   THE PATIENT IS ENCOURAGED TO PRACTICE SOCIAL DISTANCING DUE TO THE COVID-19 PANDEMIC.

## 2021-02-09 NOTE — Patient Instructions (Signed)

## 2021-02-10 LAB — BMP8+EGFR
BUN/Creatinine Ratio: 13 (ref 9–23)
BUN: 11 mg/dL (ref 6–24)
CO2: 28 mmol/L (ref 20–29)
Calcium: 10.4 mg/dL — ABNORMAL HIGH (ref 8.7–10.2)
Chloride: 104 mmol/L (ref 96–106)
Creatinine, Ser: 0.82 mg/dL (ref 0.57–1.00)
Glucose: 74 mg/dL (ref 70–99)
Potassium: 3.8 mmol/L (ref 3.5–5.2)
Sodium: 143 mmol/L (ref 134–144)
eGFR: 83 mL/min/{1.73_m2} (ref >59)

## 2021-02-10 LAB — LIPID PANEL
Chol/HDL Ratio: 2.6 ratio (ref 0.0–4.4)
Cholesterol, Total: 162 mg/dL (ref 100–199)
HDL: 63 mg/dL (ref >39)
LDL Chol Calc (NIH): 79 mg/dL (ref 0–99)
Triglycerides: 112 mg/dL (ref 0–149)
VLDL Cholesterol Cal: 20 mg/dL (ref 5–40)

## 2021-03-02 ENCOUNTER — Other Ambulatory Visit: Payer: Self-pay | Admitting: Nurse Practitioner

## 2021-03-02 MED ORDER — TRAMADOL HCL 50 MG PO TABS: ORAL_TABLET | ORAL | 0 refills | Status: DC

## 2021-03-19 ENCOUNTER — Other Ambulatory Visit: Payer: Self-pay | Admitting: Cardiovascular Disease

## 2021-03-19 ENCOUNTER — Other Ambulatory Visit: Payer: Self-pay | Admitting: Nurse Practitioner

## 2021-03-20 ENCOUNTER — Other Ambulatory Visit: Payer: Self-pay | Admitting: Nurse Practitioner

## 2021-03-20 ENCOUNTER — Other Ambulatory Visit: Payer: Self-pay

## 2021-03-20 DIAGNOSIS — M25561 Pain in right knee: Secondary | ICD-10-CM

## 2021-03-20 DIAGNOSIS — M25562 Pain in left knee: Secondary | ICD-10-CM

## 2021-03-20 MED ORDER — TRAMADOL HCL 50 MG PO TABS: ORAL_TABLET | ORAL | 0 refills | Status: DC

## 2021-03-20 NOTE — Telephone Encounter (Signed)
Clarify where she is having pain and make her aware we need to make a referral either to orthopedic or pain clinic

## 2021-03-20 NOTE — Progress Notes (Signed)
PDMP reviewed during this encounter. She is to be referred to orthopedic for chronic knee pain. Advised by staff would need to be referred to Orthopedics due to chronic knee pain. I have sent a refill of Tramadol until she is seen with orthopedics ?

## 2021-03-20 NOTE — Telephone Encounter (Signed)
Prescription refill request for Eliquis received. ?Indication:Afib ?Last office visit:6/22 ?Scr:0.8 ?Age: 56 ?Weight:111.8 kg ? ?Prescription refilled ? ?

## 2021-03-30 ENCOUNTER — Ambulatory Visit (INDEPENDENT_AMBULATORY_CARE_PROVIDER_SITE_OTHER): Payer: 59

## 2021-03-30 DIAGNOSIS — I4729 Other ventricular tachycardia: Secondary | ICD-10-CM

## 2021-03-31 LAB — CUP PACEART REMOTE DEVICE CHECK
Battery Remaining Longevity: 150 mo
Battery Remaining Percentage: 100 %
Brady Statistic RA Percent Paced: 10 %
Brady Statistic RV Percent Paced: 1 %
Date Time Interrogation Session: 20230323005100
HighPow Impedance: 72 Ohm
Implantable Lead Implant Date: 20220318
Implantable Lead Implant Date: 20220318
Implantable Lead Location: 753859
Implantable Lead Location: 753860
Implantable Lead Model: 673
Implantable Lead Model: 7841
Implantable Lead Serial Number: 1122351
Implantable Lead Serial Number: 158410
Implantable Pulse Generator Implant Date: 20220318
Lead Channel Impedance Value: 388 Ohm
Lead Channel Impedance Value: 654 Ohm
Lead Channel Setting Pacing Amplitude: 2 V
Lead Channel Setting Pacing Amplitude: 2.5 V
Lead Channel Setting Pacing Pulse Width: 0.4 ms
Lead Channel Setting Sensing Sensitivity: 0.5 mV
Pulse Gen Serial Number: 593724

## 2021-04-07 NOTE — Progress Notes (Signed)
Remote ICD transmission.   

## 2021-04-10 ENCOUNTER — Other Ambulatory Visit: Payer: Self-pay

## 2021-04-10 DIAGNOSIS — I872 Venous insufficiency (chronic) (peripheral): Secondary | ICD-10-CM

## 2021-04-17 NOTE — Progress Notes (Signed)
? ? ? ? ? ? ? ?VASCULAR & VEIN SPECIALISTS  ?         OF [place]  ?History and Physical ? ? ?Alexandra Bell is a 55 y.o. female who presents with BLE swelling.  She does have some pain in the right leg.  She states that she wakes from her sleep with severe tingling in the right foot and leg.  She states that she is pre diabetic.  She does not have any hx of DVT.  There is no family hx of varicose veins.  She has not worn compression socks in the past.  She denies any claudication, rest pain or non healing wounds.  Her right leg sx are worse.  She does work at [employer] and is on her feet quite a bit. ? ? ?She has hx of hypertension and hyperlipidemia. She has hx of ICD implanted in March 2022.  She has hx of PAF and is on Eliquis.   ? ?The pt is not on a statin for cholesterol management.  ?The pt is not on a daily aspirin.   Other AC:  Eliquis ?The pt is on CCB, BB, diuretic for hypertension.   ?The pt is diabetic.   ?Tobacco hx:  current ? ? ?Past Medical History:  ?Diagnosis Date  ? Acute renal failure ([place])   ? Acute renal failure ([place]) 11/27/2010  ? Arthritis   ? Asthma   ? Colitis, eosinophilic 75/64/3329  ? Common bile duct stone   ? Eosinophilia 01/16/2011  ? Eosinophilic enteritis 51/8841  ? Fatty liver 11/2010  ? GERD (gastroesophageal reflux disease)   ? HOCM (hypertrophic obstructive cardiomyopathy) ([place]) 12/10/2019  ? Hx: UTI (urinary tract infection)   ? Hypertension   ? Inguinal hernia   ? Morbid obesity ([place]) 12/10/2019  ? NSVT (nonsustained ventricular tachycardia) 03/10/2020  ? Obesity, Class III, BMI 40-49.9 (morbid obesity) ([place])   ? Periumbilical hernia   ? Upper GI bleed 09/11/2020  ? ? ?Past Surgical History:  ?Procedure Laterality Date  ? ABDOMINAL HYSTERECTOMY    ? BIOPSY  09/13/2020  ? Procedure: BIOPSY;  Surgeon: Jackquline Denmark, MD;  Location: [facility] ENDOSCOPY;  Service: Endoscopy;;  ? BIOPSY  10/05/2020  ? Procedure: BIOPSY;  Surgeon: Jerene Bears, MD;  Location: [facility] ENDOSCOPY;  Service:  Gastroenterology;;  ? BRONCHOSCOPY, LYMPH NODE [place]  MAY 2011  ? CHOLECYSTECTOMY  2001  ? COLONOSCOPY N/A 09/13/2020  ? Procedure: COLONOSCOPY;  Surgeon: Jackquline Denmark, MD;  Location: [facility] ENDOSCOPY;  Service: Endoscopy;  Laterality: N/A;  ? ERCP/SPHINCTEROTOMY/STONE EXTRACTION  2001  ? ESOPHAGOGASTRODUODENOSCOPY (EGD) WITH PROPOFOL N/A 09/13/2020  ? Procedure: ESOPHAGOGASTRODUODENOSCOPY (EGD) WITH PROPOFOL;  Surgeon: Jackquline Denmark, MD;  Location[facility] ENDOSCOPY;  Service: Endoscopy;  Laterality: N/A;  ? ESOPHAGOGASTRODUODENOSCOPY (EGD) WITH PROPOFOL N/A 10/05/2020  ? Procedure: ESOPHAGOGASTRODUODENOSCOPY (EGD) WITH PROPOFOL;  Surgeon: Jerene Bears, MD;  Location: [facility] ENDOSCOPY;  Service: Gastroenterology;  Laterality: N/A;  ? FLEXIBLE SIGMOIDOSCOPY  11/29/2010  ? Procedure: FLEXIBLE SIGMOIDOSCOPY;  Surgeon: Lafayette Dragon, MD;  Location: [facility] ENDOSCOPY;  Service: Endoscopy;  Laterality: N/A;  ? HEMOSTASIS CLIP PLACEMENT  10/05/2020  ? Procedure: HEMOSTASIS CLIP PLACEMENT;  Surgeon: Jerene Bears, MD;  Location: [place];  Service: Gastroenterology;;  ? ICD IMPLANT N/A 03/25/2020  ? Procedure: ICD IMPLANT;  Surgeon: Vickie Epley, MD;  Location: [place] CV LAB;  Service: Cardiovascular;  Laterality: N/A;  ? ? ?Social History  ? ?Socioeconomic History  ? Marital status: Married  ?  Spouse name: Not on file  ? Number of children: 2  ? Years of education: Not on file  ? Highest education level: Not on file  ?Occupational History  ? Occupation: Theme park manager, server  ?  Employer: UNEMPLOYED  ?Tobacco Use  ? Smoking status: Every Day  ?  Packs/day: 0.25  ?  Years: 30.00  ?  Pack years: 7.50  ?  Types: Cigarettes  ? Smokeless tobacco: Never  ? Tobacco comments:  ?  she is less than a PPD - states down to 6 cigarettes a day - 10/14, reports smoking 1 PPD for 10 years  ?Vaping Use  ? Vaping Use: Never used  ?Substance and Sexual Activity  ? Alcohol use: No  ? Drug use: No  ? Sexual activity: Not on file  ?Other Topics Concern  ?  Not on file  ?Social History Narrative  ? Not on file  ? ?Social Determinants of Health  ? ?Financial Resource Strain: Not on file  ?Food Insecurity: Not on file  ?Transportation Needs: Not on file  ?Physical Activity: Not on file  ?Stress: Not on file  ?Social Connections: Not on file  ?Intimate Partner Violence: Not on file  ? ? ?Family History  ?Problem Relation Age of Onset  ? Diabetes Mother   ? Colon cancer Father   ?     ?  ? Heart attack Father   ? Diabetes Sister   ? ? ?Current Outpatient Medications  ?Medication Sig Dispense Refill  ? acetaminophen (TYLENOL) 500 MG tablet Take 2 tablets (1,000 mg total) by mouth every 6 (six) hours as needed (For pain).  0  ? albuterol (VENTOLIN HFA) 108 (90 Base) MCG/ACT inhaler Inhale 2 puffs into the lungs every 6 (six) hours as needed. For asthma 1 each 3  ? amLODipine (NORVASC) 10 MG tablet Take 1 tablet (10 mg total) by mouth daily. 90 tablet 3  ? apixaban (ELIQUIS) 5 MG TABS tablet Take 1 tablet by mouth twice daily 180 tablet 1  ? carvedilol (COREG) 25 MG tablet Take 1 tablet (25 mg total) by mouth 2 (two) times daily. 180 tablet 3  ? dapagliflozin propanediol (FARXIGA) 10 MG TABS tablet Take 1 tablet (10 mg total) by mouth daily before breakfast. 90 tablet 1  ? diclofenac Sodium (VOLTAREN) 1 % GEL Apply 2 g topically daily as needed (For pain). 50 g 1  ? ferrous sulfate 325 (65 FE) MG EC tablet Take 1 tablet (325 mg total) by mouth 2 (two) times daily with a meal. 180 tablet 2  ? furosemide (LASIX) 80 MG tablet Take 1 tablet by mouth once daily 90 tablet 1  ? gabapentin (NEURONTIN) 300 MG capsule Take 1 capsule (300 mg total) by mouth 2 (two) times daily. 180 capsule 1  ? Magnesium 250 MG TABS Take 250 mg by mouth daily at 2 PM.    ? melatonin 5 MG TABS Take 5 mg by mouth daily at 2 PM.    ? metFORMIN (GLUCOPHAGE) 500 MG tablet TAKE 1 TABLET BY MOUTH TWICE DAILY WITH A MEAL (Patient taking differently: Take 500 mg by mouth 2 (two) times daily with a meal.) 180  tablet 2  ? Multiple Vitamin (MULTIVITAMIN WITH MINERALS) TABS tablet Take 1 tablet by mouth daily.    ? pantoprazole (PROTONIX) 40 MG tablet Take 1 tablet (40 mg total) by mouth 2 (two) times daily. 180 tablet 2  ? potassium chloride SA (KLOR-CON) 20 MEQ tablet Take 1 tablet (20  mEq total) by mouth daily. 90 tablet 2  ? traMADol (ULTRAM) 50 MG tablet TAKE 1 TABLET BY MOUTH EVERY 12 HOURS AS NEEDED FOR  SEVERE  PAIN  OR  MODERATE  PAIN 20 tablet 0  ? ?No current facility-administered medications for this visit.  ? ? ?No Known Allergies ? ?REVIEW OF SYSTEMS:  ? ?'[X]'$  denotes positive finding, '[ ]'$  denotes negative finding ?Cardiac  Comments:  ?Chest pain or chest pressure:    ?Shortness of breath upon exertion:    ?Short of breath when lying flat:    ?Irregular heart rhythm: x   ?    ?Vascular    ?Pain in calf, thigh, or hip brought on by ambulation: x See HPI  ?Pain in feet at night that wakes you up from your sleep:     ?Blood clot in your veins:    ?Leg swelling:  x mild  ?    ?Pulmonary    ?Oxygen at home:    ?Productive cough:     ?Wheezing:     ?    ?Neurologic    ?Sudden weakness in arms or legs:     ?Sudden numbness in arms or legs:     ?Sudden onset of difficulty speaking or slurred speech:    ?Temporary loss of vision in one eye:     ?Problems with dizziness:     ?    ?Gastrointestinal    ?Blood in stool:     ?Vomited blood:     ?    ?Genitourinary    ?Burning when urinating:     ?Blood in urine:    ?    ?Psychiatric    ?Major depression:     ?    ?Hematologic    ?Bleeding problems:    ?Problems with blood clotting too easily:    ?    ?Skin    ?Rashes or ulcers:    ?    ?Constitutional    ?Fever or chills:    ? ? ?PHYSICAL EXAMINATION: ? ?Today's Vitals  ? 04/20/21 1233  ?BP: 137/73  ?Pulse: 69  ?Temp: 97.9 ?F (36.6 ?C)  ?Weight: 239 lb 14.4 oz (108.8 kg)  ?Height: '5\' 6"'$  (1.676 m)  ? ?Body mass index is 38.72 kg/m?. ? ? ?General:  WDWN in NAD; vital signs documented above ?Gait: Not observed ?HENT: WNL,  normocephalic ?Pulmonary: normal non-labored breathing without wheezing ?Cardiac: regular HR; without carotid bruits ?Abdomen: soft, NT, no masses; aortic pulse is not palpable ?Skin: without rashes ?Vascular Exam

## 2021-04-18 ENCOUNTER — Other Ambulatory Visit: Payer: Self-pay | Admitting: Cardiovascular Disease

## 2021-04-18 NOTE — Progress Notes (Signed)
? ? ?Electrophysiology Office Note ?Date: 04/20/2021 ? ?ID:  Alexandra Bell, DOB 1965/09/15, MRN [id number] ? ?PCP: [name], FNP ?Primary Cardiologist: Skeet Latch, MD ?Electrophysiologist: Vickie Epley, MD  ? ?CC: Routine ICD follow-up ? ?Alexandra Bell is a 56 y.o. female seen today for Vickie Epley, MD for routine electrophysiology followup.  Since last being seen in our clinic the patient reports doing well overall.  she denies chest pain, palpitations, dyspnea, PND, orthopnea, nausea, vomiting, dizziness, syncope, edema, weight gain, or early satiety. She has not had ICD shocks.  ? ?Device History: ?[employer] ICD implanted 03/2020 for HOCM ? ?Past Medical History:  ?Diagnosis Date  ? Acute renal failure ([place])   ? Acute renal failure ([place]) 11/27/2010  ? Arthritis   ? Asthma   ? Colitis, eosinophilic 76/72/0947  ? Common bile duct stone   ? Eosinophilia 01/16/2011  ? Eosinophilic enteritis 09/6281  ? Fatty liver 11/2010  ? GERD (gastroesophageal reflux disease)   ? HOCM (hypertrophic obstructive cardiomyopathy) ([place]) 12/10/2019  ? Hx: UTI (urinary tract infection)   ? Hypertension   ? Inguinal hernia   ? Morbid obesity ([place]) 12/10/2019  ? NSVT (nonsustained ventricular tachycardia) ([place]) 03/10/2020  ? Obesity, Class III, BMI 40-49.9 (morbid obesity) ([place])   ? Periumbilical hernia   ? Upper GI bleed 09/11/2020  ? ?Past Surgical History:  ?Procedure Laterality Date  ? ABDOMINAL HYSTERECTOMY    ? BIOPSY  09/13/2020  ? Procedure: BIOPSY;  Surgeon: Jackquline Denmark, MD;  Location: [facility] ENDOSCOPY;  Service: Endoscopy;;  ? BIOPSY  10/05/2020  ? Procedure: BIOPSY;  Surgeon: Jerene Bears, MD;  Location: [facility] ENDOSCOPY;  Service: Gastroenterology;;  ? BRONCHOSCOPY, LYMPH NODE [place]  MAY 2011  ? CHOLECYSTECTOMY  2001  ? COLONOSCOPY N/A 09/13/2020  ? Procedure: COLONOSCOPY;  Surgeon: Jackquline Denmark, MD;  Location: [facility] ENDOSCOPY;  Service: Endoscopy;  Laterality: N/A;  ? ERCP/SPHINCTEROTOMY/STONE  EXTRACTION  2001  ? ESOPHAGOGASTRODUODENOSCOPY (EGD) WITH PROPOFOL N/A 09/13/2020  ? Procedure: ESOPHAGOGASTRODUODENOSCOPY (EGD) WITH PROPOFOL;  Surgeon: Jackquline Denmark, MD;  Location: [facility] ENDOSCOPY;  Service: Endoscopy;  Laterality: N/A;  ? ESOPHAGOGASTRODUODENOSCOPY (EGD) WITH PROPOFOL N/A 10/05/2020  ? Procedure: ESOPHAGOGASTRODUODENOSCOPY (EGD) WITH PROPOFOL;  Surgeon: Jerene Bears, MD;  Location: [facility] ENDOSCOPY;  Service: Gastroenterology;  Laterality: N/A;  ? FLEXIBLE SIGMOIDOSCOPY  11/29/2010  ? Procedure: FLEXIBLE SIGMOIDOSCOPY;  Surgeon: Lafayette Dragon, MD;  Location: [facility] ENDOSCOPY;  Service: Endoscopy;  Laterality: N/A;  ? HEMOSTASIS CLIP PLACEMENT  10/05/2020  ? Procedure: HEMOSTASIS CLIP PLACEMENT;  Surgeon: Jerene Bears, MD;  Location: [place];  Service: Gastroenterology;;  ? ICD IMPLANT N/A 03/25/2020  ? Procedure: ICD IMPLANT;  Surgeon: Vickie Epley, MD;  Location: [place] CV LAB;  Service: Cardiovascular;  Laterality: N/A;  ? ? ?Current Outpatient Medications  ?Medication Sig Dispense Refill  ? acetaminophen (TYLENOL) 500 MG tablet Take 2 tablets (1,000 mg total) by mouth every 6 (six) hours as needed (For pain).  0  ? albuterol (VENTOLIN HFA) 108 (90 Base) MCG/ACT inhaler Inhale 2 puffs into the lungs every 6 (six) hours as needed. For asthma 1 each 3  ? apixaban (ELIQUIS) 5 MG TABS tablet Take 1 tablet by mouth twice daily 180 tablet 1  ? carvedilol (COREG) 25 MG tablet Take 1 tablet by mouth twice daily 180 tablet 0  ? dapagliflozin propanediol (FARXIGA) 10 MG TABS tablet Take 1 tablet (10 mg total) by mouth daily before breakfast. 90 tablet 1  ?  diclofenac Sodium (VOLTAREN) 1 % GEL Apply 2 g topically daily as needed (For pain). 50 g 1  ? ferrous sulfate 325 (65 FE) MG EC tablet Take 1 tablet (325 mg total) by mouth 2 (two) times daily with a meal. 180 tablet 2  ? furosemide (LASIX) 80 MG tablet Take 1 tablet by mouth once daily 90 tablet 1  ? gabapentin (NEURONTIN) 300 MG capsule Take 1  capsule (300 mg total) by mouth 2 (two) times daily. 180 capsule 1  ? Magnesium 250 MG TABS Take 250 mg by mouth daily at 2 PM.    ? melatonin 5 MG TABS Take 5 mg by mouth daily at 2 PM.    ? metFORMIN (GLUCOPHAGE) 500 MG tablet TAKE 1 TABLET BY MOUTH TWICE DAILY WITH A MEAL (Patient taking differently: Take 500 mg by mouth 2 (two) times daily with a meal.) 180 tablet 2  ? Multiple Vitamin (MULTIVITAMIN WITH MINERALS) TABS tablet Take 1 tablet by mouth daily.    ? potassium chloride SA (KLOR-CON) 20 MEQ tablet Take 1 tablet (20 mEq total) by mouth daily. 90 tablet 2  ? traMADol (ULTRAM) 50 MG tablet TAKE 1 TABLET BY MOUTH EVERY 12 HOURS AS NEEDED FOR  SEVERE  PAIN  OR  MODERATE  PAIN 20 tablet 0  ? amLODipine (NORVASC) 10 MG tablet Take 1 tablet (10 mg total) by mouth daily. 90 tablet 3  ? pantoprazole (PROTONIX) 40 MG tablet Take 1 tablet (40 mg total) by mouth 2 (two) times daily. 180 tablet 2  ? ?No current facility-administered medications for this visit.  ? ? ?Allergies:   Patient has no known allergies.  ? ?Social History: ?Social History  ? ?Socioeconomic History  ? Marital status: Married  ?  Spouse name: Not on file  ? Number of children: 2  ? Years of education: Not on file  ? Highest education level: Not on file  ?Occupational History  ? Occupation: Theme park manager, server  ?  Employer: UNEMPLOYED  ?Tobacco Use  ? Smoking status: Every Day  ?  Packs/day: 0.25  ?  Years: 30.00  ?  Pack years: 7.50  ?  Types: Cigarettes  ? Smokeless tobacco: Never  ? Tobacco comments:  ?  she is less than a PPD - states down to 6 cigarettes a day - 10/14, reports smoking 1 PPD for 10 years  ?Vaping Use  ? Vaping Use: Never used  ?Substance and Sexual Activity  ? Alcohol use: No  ? Drug use: No  ? Sexual activity: Not on file  ?Other Topics Concern  ? Not on file  ?Social History Narrative  ? Not on file  ? ?Social Determinants of Health  ? ?Financial Resource Strain: Not on file  ?Food Insecurity: Not on file  ?Transportation  Needs: Not on file  ?Physical Activity: Not on file  ?Stress: Not on file  ?Social Connections: Not on file  ?Intimate Partner Violence: Not on file  ? ? ?Family History: ?Family History  ?Problem Relation Age of Onset  ? Diabetes Mother   ? Colon cancer Father   ?     ?  ? Heart attack Father   ? Diabetes Sister   ? ? ?Review of Systems: ?All other systems reviewed and are otherwise negative except as noted above. ? ? ?Physical Exam: ?Vitals:  ? 04/20/21 0943  ?BP: 122/70  ?Pulse: 66  ?SpO2: 92%  ?Weight: 242 lb 12.8 oz (110.1 kg)  ?Height: '5\' 6"'$  (1.676  m)  ?  ? ?GEN- The patient is well appearing, alert and oriented x 3 today.   ?HEENT: normocephalic, atraumatic; sclera clear, conjunctiva pink; hearing intact; oropharynx clear; neck supple, no JVP ?Lymph- no cervical lymphadenopathy ?Lungs- Clear to ausculation bilaterally, normal work of breathing.  No wheezes, rales, rhonchi ?Heart- Regular rate and rhythm, no murmurs, rubs or gallops, PMI not laterally displaced ?GI- soft, non-tender, non-distended, bowel sounds present, no hepatosplenomegaly ?Extremities- no clubbing or cyanosis. No edema; DP/PT/radial pulses 2+ bilaterally ?MS- no significant deformity or atrophy ?Skin- warm and dry, no rash or lesion; ICD pocket well healed ?Psych- euthymic mood, full affect ?Neuro- strength and sensation are intact ? ?ICD interrogation- reviewed in detail today,  See PACEART report ? ?EKG:  EKG is not ordered today. ? ?Recent Labs: ?09/11/2020: TSH 1.842 ?10/04/2020: B Natriuretic Peptide 1,046.8 ?10/06/2020: ALT 11 ?11/03/2020: Hemoglobin 9.3; Platelets 304 ?02/09/2021: BUN 11; Creatinine, Ser 0.82; Potassium 3.8; Sodium 143  ? ?Wt Readings from Last 3 Encounters:  ?04/20/21 242 lb 12.8 oz (110.1 kg)  ?02/09/21 246 lb 6.4 oz (111.8 kg)  ?11/03/20 253 lb 9.6 oz (115 kg)  ?  ? ?Other studies Reviewed: ?Additional studies/ records that were reviewed today include: Previous EP office notes.  ? ?Assessment and Plan: ? ?1.  HOCMs/p  [employer] dual chamber ICD  ?euvolemic today ?Stable on an appropriate medical regimen ?Normal ICD function ?See Claudia Desanctis Art report ?No changes today ? ?2. Morbid Obesity ?Body mass index is 39.19 kg/m?Marland Kitchen  ?

## 2021-04-18 NOTE — Telephone Encounter (Signed)
Rx(s) sent to pharmacy electronically.  

## 2021-04-20 ENCOUNTER — Ambulatory Visit: Payer: 59 | Admitting: Student

## 2021-04-20 ENCOUNTER — Ambulatory Visit: Payer: 59 | Admitting: Physician Assistant

## 2021-04-20 ENCOUNTER — Encounter: Payer: Self-pay | Admitting: Student

## 2021-04-20 ENCOUNTER — Ambulatory Visit (HOSPITAL_COMMUNITY)
Admission: RE | Admit: 2021-04-20 | Discharge: 2021-04-20 | Disposition: A | Discharge status: Home / Self Care | Payer: 59 | Source: Ambulatory Visit | Attending: Physician Assistant | Admitting: Physician Assistant

## 2021-04-20 ENCOUNTER — Encounter: Payer: Self-pay | Admitting: Physician Assistant

## 2021-04-20 VITALS — BP 122/70 | HR 66 | Ht 66.0 in | Wt 242.8 lb

## 2021-04-20 VITALS — BP 137/73 | HR 69 | Temp 97.9°F | Ht 66.0 in | Wt 239.9 lb

## 2021-04-20 DIAGNOSIS — M7989 Other specified soft tissue disorders: Secondary | ICD-10-CM | POA: Diagnosis not present

## 2021-04-20 DIAGNOSIS — I10 Essential (primary) hypertension: Secondary | ICD-10-CM | POA: Diagnosis not present

## 2021-04-20 DIAGNOSIS — Z95810 Presence of automatic (implantable) cardiac defibrillator: Secondary | ICD-10-CM

## 2021-04-20 DIAGNOSIS — I872 Venous insufficiency (chronic) (peripheral): Secondary | ICD-10-CM | POA: Diagnosis not present

## 2021-04-20 DIAGNOSIS — I4729 Other ventricular tachycardia: Secondary | ICD-10-CM | POA: Diagnosis not present

## 2021-04-20 DIAGNOSIS — I421 Obstructive hypertrophic cardiomyopathy: Secondary | ICD-10-CM

## 2021-04-20 LAB — CUP PACEART INCLINIC DEVICE CHECK
Date Time Interrogation Session: 20230413095400
HighPow Impedance: 65 Ohm
Implantable Lead Implant Date: 20220318
Implantable Lead Implant Date: 20220318
Implantable Lead Location: 753859
Implantable Lead Location: 753860
Implantable Lead Model: 673
Implantable Lead Model: 7841
Implantable Lead Serial Number: 1122351
Implantable Lead Serial Number: 158410
Implantable Pulse Generator Implant Date: 20220318
Lead Channel Impedance Value: 419 Ohm
Lead Channel Impedance Value: 654 Ohm
Lead Channel Pacing Threshold Amplitude: 0.6 V
Lead Channel Pacing Threshold Amplitude: 0.7 V
Lead Channel Pacing Threshold Pulse Width: 0.4 ms
Lead Channel Pacing Threshold Pulse Width: 0.4 ms
Lead Channel Sensing Intrinsic Amplitude: 10.2 mV
Lead Channel Sensing Intrinsic Amplitude: 25 mV
Lead Channel Setting Pacing Amplitude: 2 V
Lead Channel Setting Pacing Amplitude: 2.5 V
Lead Channel Setting Pacing Pulse Width: 0.4 ms
Lead Channel Setting Sensing Sensitivity: 0.5 mV
Pulse Gen Serial Number: 593724

## 2021-05-02 ENCOUNTER — Other Ambulatory Visit: Payer: Self-pay

## 2021-05-02 MED ORDER — POTASSIUM CHLORIDE CRYS ER 20 MEQ PO TBCR: 20.0000 meq | EXTENDED_RELEASE_TABLET | Freq: Every day | ORAL | 2 refills | Status: DC

## 2021-05-14 ENCOUNTER — Other Ambulatory Visit: Payer: Self-pay | Admitting: Cardiovascular Disease

## 2021-05-15 NOTE — Telephone Encounter (Signed)
Rx(s) sent to pharmacy electronically.  

## 2021-06-09 ENCOUNTER — Other Ambulatory Visit: Payer: Self-pay | Admitting: Nurse Practitioner

## 2021-06-09 ENCOUNTER — Encounter: Payer: Self-pay | Admitting: *Deleted

## 2021-06-09 DIAGNOSIS — R7309 Other abnormal glucose: Secondary | ICD-10-CM

## 2021-06-15 ENCOUNTER — Encounter: Payer: Self-pay | Admitting: Physician Assistant

## 2021-06-15 ENCOUNTER — Ambulatory Visit: Payer: 59 | Admitting: Physician Assistant

## 2021-06-15 VITALS — BP 140/80 | HR 70 | Ht 66.0 in | Wt 237.0 lb

## 2021-06-15 DIAGNOSIS — D649 Anemia, unspecified: Secondary | ICD-10-CM | POA: Diagnosis not present

## 2021-06-15 DIAGNOSIS — K625 Hemorrhage of anus and rectum: Secondary | ICD-10-CM

## 2021-06-15 NOTE — Progress Notes (Signed)
Chief Complaint: Rectal Bleeding, Occasional abdominal pain  HPI:    [name] is a 56 year old African-American female with a past medical history as listed below including reflux and multiple others, known to Dr. Ardis Hughs from previous hospitalization, who was referred to me by [name], [place] for a complaint of rectal bleeding and occasional abdominal pain.     09/13/2020 colonoscopy with Dr. Lyndel Safe with nonbleeding internal hemorrhoids and otherwise normal.  Repeat recommended in 10 years.    10/05/2020 EGD with normal esophagus, scattered gastritis with scant adherent heme but no active bleeding or ulcerations, duodenitis with 1 ulcer with hemorrhage clipped and normal second portion of the duodenum.  At that time recommended holding Eliquis 7 days if possible.  Biopsies showed nonspecific reactive gastropathy negative for H. pylori.  That time recommended twice daily PPI for at least a month and then once daily afterwards.  Also advised to avoid NSAIDs.    Today, the patient presents to clinic accompanied by a family member who assists with her history.  She tells me that she is still taking 325 mg of iron twice daily and that her primary care provider draws her regular labs.  Tells me to her knowledge she has not been anemic.  Does describe very occasional bright red blood on the toilet paper when wiping after a bowel movement.  Often times she does have to strain, but this will typically resolve within a day so she has never done anything for this.  She blames on her iron supplementation.  Other than this she does have occasional abdominal pain after eating and will oftentimes have to run to the toilet to have a bowel movement, but this has been normal for her whole life.    Denies fever, chills, weight loss, change in bowel habits, heartburn, reflux, nausea or vomiting.  Past Medical History:  Diagnosis Date   Acute renal failure ([place]) 11/27/2010   Adrenal adenoma    Arthritis    Asthma     Colitis, eosinophilic 63/01/6008   Common bile duct stone    Duodenal ulcer    Eosinophilia 93/23/5573   Eosinophilic enteritis 22/0254   Fatty liver 11/2010   Gastritis    GERD (gastroesophageal reflux disease)    HOCM (hypertrophic obstructive cardiomyopathy) ([place]) 12/10/2019   Hx: UTI (urinary tract infection)    Hypertension    Inguinal hernia    Morbid obesity ([place]) 12/10/2019   NSVT (nonsustained ventricular tachycardia) ([place]) 03/10/2020   Obesity, Class III, BMI 40-49.9 (morbid obesity) ([place])    Periumbilical hernia    Upper GI bleed 09/11/2020    Past Surgical History:  Procedure Laterality Date   ABDOMINAL HYSTERECTOMY     BIOPSY  09/13/2020   Procedure: BIOPSY;  Surgeon: Jackquline Denmark, MD;  Location: [facility] ENDOSCOPY;  Service: Endoscopy;;   BIOPSY  10/05/2020   Procedure: BIOPSY;  Surgeon: Jerene Bears, MD;  Location: [facility] ENDOSCOPY;  Service: Gastroenterology;;   BRONCHOSCOPY, LYMPH NODE [place]  MAY 2011   CHOLECYSTECTOMY  2001   COLONOSCOPY N/A 09/13/2020   Procedure: COLONOSCOPY;  Surgeon: Jackquline Denmark, MD;  Location: [facility] ENDOSCOPY;  Service: Endoscopy;  Laterality: N/A;   ERCP/SPHINCTEROTOMY/STONE EXTRACTION  2001   ESOPHAGOGASTRODUODENOSCOPY (EGD) WITH PROPOFOL N/A 09/13/2020   Procedure: ESOPHAGOGASTRODUODENOSCOPY (EGD) WITH PROPOFOL;  Surgeon: Jackquline Denmark, MD;  Location: [facility] ENDOSCOPY;  Service: Endoscopy;  Laterality: N/A;   ESOPHAGOGASTRODUODENOSCOPY (EGD) WITH PROPOFOL N/A 10/05/2020   Procedure: ESOPHAGOGASTRODUODENOSCOPY (EGD) WITH PROPOFOL;  Surgeon: Jerene Bears, MD;  Location: [facility] ENDOSCOPY;  Service: Gastroenterology;  Laterality: N/A;   FLEXIBLE SIGMOIDOSCOPY  11/29/2010   Procedure: FLEXIBLE SIGMOIDOSCOPY;  Surgeon: Lafayette Dragon, MD;  Location: [facility] ENDOSCOPY;  Service: Endoscopy;  Laterality: N/A;   HEMOSTASIS CLIP PLACEMENT  10/05/2020   Procedure: HEMOSTASIS CLIP PLACEMENT;  Surgeon: Jerene Bears, MD;  Location: [facility] ENDOSCOPY;  Service: Gastroenterology;;   ICD  IMPLANT N/A 03/25/2020   Procedure: ICD IMPLANT;  Surgeon: Vickie Epley, MD;  Location: [place] CV LAB;  Service: Cardiovascular;  Laterality: N/A;    Current Outpatient Medications  Medication Sig Dispense Refill   acetaminophen (TYLENOL) 500 MG tablet Take 2 tablets (1,000 mg total) by mouth every 6 (six) hours as needed (For pain).  0   albuterol (VENTOLIN HFA) 108 (90 Base) MCG/ACT inhaler Inhale 2 puffs into the lungs every 6 (six) hours as needed. For asthma 1 each 3   apixaban (ELIQUIS) 5 MG TABS tablet Take 1 tablet by mouth twice daily 180 tablet 1   carvedilol (COREG) 25 MG tablet Take 1 tablet by mouth twice daily 180 tablet 0   dapagliflozin propanediol (FARXIGA) 10 MG TABS tablet Take 1 tablet (10 mg total) by mouth daily before breakfast. 90 tablet 1   diclofenac Sodium (VOLTAREN) 1 % GEL Apply 2 g topically daily as needed (For pain). 50 g 1   ferrous sulfate 325 (65 FE) MG EC tablet Take 1 tablet (325 mg total) by mouth 2 (two) times daily with a meal. 180 tablet 2   furosemide (LASIX) 80 MG tablet Take 1 tablet by mouth once daily 90 tablet 2   gabapentin (NEURONTIN) 300 MG capsule Take 1 capsule (300 mg total) by mouth 2 (two) times daily. 180 capsule 1   Magnesium 250 MG TABS Take 250 mg by mouth daily at 2 PM.     melatonin 5 MG TABS Take 5 mg by mouth daily at 2 PM.     metFORMIN (GLUCOPHAGE) 500 MG tablet Take 1 tablet (500 mg total) by mouth 2 (two) times daily with a meal. 90 tablet 1   Multiple Vitamin (MULTIVITAMIN WITH MINERALS) TABS tablet Take 1 tablet by mouth daily.     pantoprazole (PROTONIX) 40 MG tablet Take 1 tablet (40 mg total) by mouth 2 (two) times daily. 180 tablet 2   potassium chloride SA (KLOR-CON M) 20 MEQ tablet Take 1 tablet (20 mEq total) by mouth daily. 90 tablet 2   traMADol (ULTRAM) 50 MG tablet TAKE 1 TABLET BY MOUTH EVERY 12 HOURS AS NEEDED FOR  SEVERE  PAIN  OR  MODERATE  PAIN 20 tablet 0   No current facility-administered  medications for this visit.    Allergies as of 06/15/2021   (No Known Allergies)    Family History  Problem Relation Age of Onset   Diabetes Mother    Colon cancer Father        ?   Heart attack Father    Diabetes Sister     Social History   Socioeconomic History   Marital status: Married    Spouse name: Not on file   Number of children: 2   Years of education: Not on file   Highest education level: Not on file  Occupational History   Occupation: Food prep, Metallurgist: UNEMPLOYED  Tobacco Use   Smoking status: Every Day    Packs/day: 0.25    Years: 30.00    Total pack years: 7.50  Types: Cigarettes   Smokeless tobacco: Never   Tobacco comments:    she is less than a PPD - states down to 6 cigarettes a day - 10/14, reports smoking 1 PPD for 10 years  Vaping Use   Vaping Use: Never used  Substance and Sexual Activity   Alcohol use: No   Drug use: No   Sexual activity: Not on file  Other Topics Concern   Not on file  Social History Narrative   Not on file   Social Determinants of Health   Financial Resource Strain: Not on file  Food Insecurity: Not on file  Transportation Needs: Not on file  Physical Activity: Not on file  Stress: Not on file  Social Connections: Not on file  Intimate Partner Violence: Not on file    Review of Systems:    Constitutional: No weight loss, fever or chills Cardiovascular: No chest pain Respiratory: No SOB Gastrointestinal: See HPI and otherwise negative   Physical Exam:  Vital signs: BP 140/80   Pulse 70   Ht '5\' 6"'$  (1.676 m)   Wt 237 lb (107.5 kg)   BMI 38.25 kg/m    Constitutional:   Pleasant overweight AA female appears to be in NAD, Well developed, Well nourished, alert and cooperative Head:  Normocephalic and atraumatic. Eyes:   PEERL, EOMI. No icterus. Conjunctiva pink. Ears:  Normal auditory acuity. Neck:  Supple Throat: Oral cavity and pharynx without inflammation, swelling or lesion.   Respiratory: Respirations even and unlabored. Lungs clear to auscultation bilaterally.   No wheezes, crackles, or rhonchi.  Cardiovascular: Normal S1, S2. No MRG. Regular rate and rhythm. No peripheral edema, cyanosis or pallor.  Gastrointestinal:  Soft, nondistended, nontender. No rebound or guarding. Normal bowel sounds. No appreciable masses or hepatomegaly. Rectal:  Declined. Msk:  Symmetrical without gross deformities. Without edema, no deformity or joint abnormality.  Neurologic:  Alert and  oriented x4;  grossly normal neurologically.  Skin:   Dry and intact without significant lesions or rashes. Psychiatric: Demonstrates good judgement and reason without abnormal affect or behaviors.  RELEVANT LABS AND IMAGING: CBC    Component Value Date/Time   WBC 8.1 11/03/2020 1014   WBC 9.7 10/07/2020 0243   RBC 3.27 (L) 11/03/2020 1014   RBC 3.08 (L) 10/07/2020 0243   HGB 9.3 (L) 11/03/2020 1014   HGB 14.3 01/17/2011 1113   HCT 30.3 (L) 11/03/2020 1014   HCT 43.5 01/17/2011 1113   PLT 304 11/03/2020 1014   MCV 93 11/03/2020 1014   MCV 92.2 01/17/2011 1113   MCH 28.4 11/03/2020 1014   MCH 25.3 (L) 10/07/2020 0243   MCHC 30.7 (L) 11/03/2020 1014   MCHC 29.9 (L) 10/07/2020 0243   RDW 18.2 (H) 11/03/2020 1014   RDW 15.5 (H) 01/17/2011 1113   LYMPHSABS 1.4 10/04/2020 0846   LYMPHSABS 2.6 03/23/2020 0000   LYMPHSABS 3.9 (H) 01/17/2011 1113   MONOABS 0.6 10/04/2020 0846   MONOABS 0.6 01/17/2011 1113   EOSABS 0.0 10/04/2020 0846   EOSABS 0.1 03/23/2020 0000   BASOSABS 0.0 10/04/2020 0846   BASOSABS 0.0 03/23/2020 0000   BASOSABS 0.1 01/17/2011 1113    CMP     Component Value Date/Time   NA 143 02/09/2021 1150   K 3.8 02/09/2021 1150   CL 104 02/09/2021 1150   CO2 28 02/09/2021 1150   GLUCOSE 74 02/09/2021 1150   GLUCOSE 115 (H) 10/07/2020 0243   BUN 11 02/09/2021 1150   CREATININE 0.82 02/09/2021 1150  CALCIUM 10.4 (H) 02/09/2021 1150   PROT 5.8 (L) 10/06/2020 0320    PROT 6.4 07/28/2020 1057   ALBUMIN 2.9 (L) 10/06/2020 0320   ALBUMIN 3.8 07/28/2020 1057   AST 14 (L) 10/06/2020 0320   ALT 11 10/06/2020 0320   ALKPHOS 42 10/06/2020 0320   BILITOT 1.1 10/06/2020 0320   BILITOT <0.2 07/28/2020 1057   GFRNONAA >60 10/07/2020 0243   GFRAA 70 01/14/2020 1259    Assessment: 1.  Rectal bleeding: Very occasional bright red blood when wiping on the toilet paper, does have some harder than normal stools at times given iron usage, colonoscopy normal in September of last year other than internal hemorrhoids; internal hemorrhoids 2.  Occasional abdominal pain: Occasional pain immediately after eating; consider gastritis +/- IBS 3.  History of duodenal ulcer: Hospitalization and finding a bleeding duodenal ulcer which was clipped, requesting recent labs  Plan: 1.  Recommend the patient buy over-the-counter Preparation H suppositories and use these as needed for any rectal bleeding.  If she sees an increase in amount or if this does not stop or changes at all then she will call and let us know.  Reassured her that she has had a recent colonoscopy with internal hemorrhoids and this is the cause given her constipation. 2.  Discussed laxatives but the patient tells me that her constipation quickly corrects itself and she does not want to have diarrhea, so she wants to continue what she is doing.  Recommend a fiber supplement daily for variance in stool. 3.  Continue twice daily PPI given occasional epigastric abdominal pains. 4.  Requesting recent labs from PCP including CBC, CMP and iron studies.  If these have not been checked lately then discussed with patient that she will come back in for labs. 5.  Reviewed antireflux diet. 6.  Patient to follow with [place] as needed or per recommendations as above.  Ellouise Newer, PA-C [facility] 06/15/2021, 9:45 AM  Cc: [name], FNP

## 2021-06-15 NOTE — Patient Instructions (Signed)
Please purchase the following medications over the counter and take as directed: Preparation H Suppositories-as needed  We are requesting records from [facility] regarding your labwork.  If you are age 57 or older, your body mass index should be between 23-30. Your Body mass index is 38.25 kg/m. If this is out of the aforementioned range listed, please consider follow up with your Primary Care Provider.  If you are age 48 or younger, your body mass index should be between 19-25. Your Body mass index is 38.25 kg/m. If this is out of the aformentioned range listed, please consider follow up with your Primary Care Provider.   _____________________________________________________ [facility] providers would like to encourage you to use [facility] to communicate with providers for non-urgent requests or questions.  Due to long hold times on the telephone, sending your provider a message by [facility] may be a faster and more efficient way to get a response.  Please allow 48 business hours for a response.  Please remember that this is for non-urgent requests.  ____________________________________________________  Due to recent changes in healthcare laws, you may see the results of your imaging and laboratory studies on MyChart before your provider has had a chance to review them.  We understand that in some cases there may be results that are confusing or concerning to you. Not all laboratory results come back in the same time frame and the provider may be waiting for multiple results in order to interpret others.  Please give [place] 48 hours in order for your provider to thoroughly review all the results before contacting the office for clarification of your results.

## 2021-06-26 NOTE — Progress Notes (Signed)
I agree with the above note, plan 

## 2021-06-29 ENCOUNTER — Ambulatory Visit (INDEPENDENT_AMBULATORY_CARE_PROVIDER_SITE_OTHER): Payer: 59

## 2021-06-29 DIAGNOSIS — Z95810 Presence of automatic (implantable) cardiac defibrillator: Secondary | ICD-10-CM

## 2021-06-29 LAB — CUP PACEART REMOTE DEVICE CHECK
Battery Remaining Longevity: 150 mo
Battery Remaining Percentage: 100 %
Brady Statistic RA Percent Paced: 8 %
Brady Statistic RV Percent Paced: 1 %
Date Time Interrogation Session: 20230622005100
HighPow Impedance: 76 Ohm
Implantable Lead Implant Date: 20220318
Implantable Lead Implant Date: 20220318
Implantable Lead Location: 753859
Implantable Lead Location: 753860
Implantable Lead Model: 673
Implantable Lead Model: 7841
Implantable Lead Serial Number: 1122351
Implantable Lead Serial Number: 158410
Implantable Pulse Generator Implant Date: 20220318
Lead Channel Impedance Value: 415 Ohm
Lead Channel Impedance Value: 677 Ohm
Lead Channel Setting Pacing Amplitude: 2 V
Lead Channel Setting Pacing Amplitude: 2.5 V
Lead Channel Setting Pacing Pulse Width: 0.4 ms
Lead Channel Setting Sensing Sensitivity: 0.5 mV
Pulse Gen Serial Number: 593724

## 2021-07-06 NOTE — Progress Notes (Signed)
Remote ICD transmission.   

## 2021-07-11 ENCOUNTER — Other Ambulatory Visit: Payer: Self-pay | Admitting: Cardiovascular Disease

## 2021-07-12 ENCOUNTER — Telehealth (HOSPITAL_BASED_OUTPATIENT_CLINIC_OR_DEPARTMENT_OTHER): Payer: Self-pay | Admitting: Cardiovascular Disease

## 2021-07-12 NOTE — Telephone Encounter (Signed)
Please call pt to schedule overdue appointment with Dr. Oval Linsey for refills. Last seen 04/2020 with Coletta Memos, NP. Was supposed to see Dr. Oval Linsey 3 months after that. Thank you!!

## 2021-07-12 NOTE — Telephone Encounter (Signed)
Left message for patient to call and schedule overdue follow up with Dr. Oval Linsey or Laurann Montana, NP for medication refills

## 2021-07-13 NOTE — Telephone Encounter (Signed)
Left message for patient to call and schedule overdue follow up with  Dr. Acequia/Caitlin Walker, NP to refill medications 

## 2021-07-18 ENCOUNTER — Telehealth: Payer: Self-pay | Admitting: Cardiovascular Disease

## 2021-07-18 MED ORDER — CARVEDILOL 25 MG PO TABS
25.0000 mg | ORAL_TABLET | Freq: Two times a day (BID) | ORAL | 0 refills | Status: DC
Start: 2021-07-18 — End: 2021-07-20

## 2021-07-18 NOTE — Telephone Encounter (Signed)
Rx(s) sent to pharmacy electronically.  

## 2021-07-18 NOTE — Telephone Encounter (Signed)
*  STAT* If patient is at the pharmacy, call can be transferred to refill team.   1. Which medications need to be refilled? (please list name of each medication and dose if known) carvedilol (COREG) 25 MG tablet  2. Which pharmacy/location (including street and city if local pharmacy) is medication to be sent to? [place] ([place]),  - [address]  3. Do they need a 30 day or 90 day supply? 90 day  Patient will be out Thursday. Patient has an appointment 09/07/2021

## 2021-07-20 ENCOUNTER — Other Ambulatory Visit: Payer: Self-pay

## 2021-07-20 MED ORDER — CARVEDILOL 25 MG PO TABS: 25.0000 mg | ORAL_TABLET | Freq: Two times a day (BID) | ORAL | 0 refills | Status: DC

## 2021-07-27 ENCOUNTER — Encounter: Payer: 59 | Admitting: Nurse Practitioner

## 2021-08-01 ENCOUNTER — Other Ambulatory Visit: Payer: Self-pay | Admitting: Nurse Practitioner

## 2021-08-01 DIAGNOSIS — G8929 Other chronic pain: Secondary | ICD-10-CM

## 2021-08-01 DIAGNOSIS — M79642 Pain in left hand: Secondary | ICD-10-CM

## 2021-08-01 NOTE — Progress Notes (Signed)
Per PCP office, patient has not had any labwork with them recently. Last labs were completed 02/2021 and included CMP and lipid panel only.

## 2021-08-10 ENCOUNTER — Encounter: Payer: Self-pay | Admitting: Nurse Practitioner

## 2021-08-10 ENCOUNTER — Ambulatory Visit (INDEPENDENT_AMBULATORY_CARE_PROVIDER_SITE_OTHER): Payer: Commercial Managed Care - HMO | Admitting: Nurse Practitioner

## 2021-08-10 VITALS — BP 122/60 | HR 77 | Temp 98.4°F | Ht 66.0 in | Wt 246.4 lb

## 2021-08-10 DIAGNOSIS — E6609 Other obesity due to excess calories: Secondary | ICD-10-CM | POA: Diagnosis not present

## 2021-08-10 DIAGNOSIS — H6121 Impacted cerumen, right ear: Secondary | ICD-10-CM | POA: Diagnosis not present

## 2021-08-10 DIAGNOSIS — Z23 Encounter for immunization: Secondary | ICD-10-CM

## 2021-08-10 DIAGNOSIS — Z0000 Encounter for general adult medical examination without abnormal findings: Secondary | ICD-10-CM

## 2021-08-10 DIAGNOSIS — Z6839 Body mass index (BMI) 39.0-39.9, adult: Secondary | ICD-10-CM

## 2021-08-10 DIAGNOSIS — I700 Atherosclerosis of aorta: Secondary | ICD-10-CM | POA: Diagnosis not present

## 2021-08-10 DIAGNOSIS — I421 Obstructive hypertrophic cardiomyopathy: Secondary | ICD-10-CM | POA: Diagnosis not present

## 2021-08-10 DIAGNOSIS — F17200 Nicotine dependence, unspecified, uncomplicated: Secondary | ICD-10-CM

## 2021-08-10 DIAGNOSIS — I119 Hypertensive heart disease without heart failure: Secondary | ICD-10-CM | POA: Diagnosis not present

## 2021-08-10 DIAGNOSIS — Z1231 Encounter for screening mammogram for malignant neoplasm of breast: Secondary | ICD-10-CM

## 2021-08-10 DIAGNOSIS — D649 Anemia, unspecified: Secondary | ICD-10-CM

## 2021-08-10 DIAGNOSIS — I10 Essential (primary) hypertension: Secondary | ICD-10-CM

## 2021-08-10 DIAGNOSIS — R7303 Prediabetes: Secondary | ICD-10-CM

## 2021-08-10 DIAGNOSIS — E66812 Obesity, class 2: Secondary | ICD-10-CM

## 2021-08-10 LAB — POCT URINALYSIS DIPSTICK
Bilirubin, UA: NEGATIVE
Blood, UA: NEGATIVE
Glucose, UA: POSITIVE — AB
Ketones, UA: NEGATIVE
Leukocytes, UA: NEGATIVE
Nitrite, UA: NEGATIVE
Protein, UA: NEGATIVE
Spec Grav, UA: 1.02 (ref 1.010–1.025)
Urobilinogen, UA: 0.2 E.U./dL
pH, UA: 5.5 (ref 5.0–8.0)

## 2021-08-10 MED ORDER — PANTOPRAZOLE SODIUM 40 MG PO TBEC: 40.0000 mg | DELAYED_RELEASE_TABLET | Freq: Two times a day (BID) | ORAL | 2 refills | Status: DC

## 2021-08-10 NOTE — Patient Instructions (Signed)

## 2021-08-10 NOTE — Progress Notes (Signed)
Barnet Glasgow Martin,acting as a Education administrator for [name], FNP.,have documented all relevant documentation on the behalf of [name], FNP,as directed by  [name], FNP while in the presence of [name], [place].   Subjective:     Patient ID: Alexandra Bell , female    DOB: Apr 14, 1965 , 56 y.o.   MRN: [id number]   Chief Complaint  Patient presents with   Annual Exam    HPI  Patient presents today for HM. Patient is complaint with all her medications, Patient has no other issues today. No changes since her last visit.   BP Readings from Last 3 Encounters: 08/10/21 : 122/60 06/15/21 : 140/80 04/20/21 : 137/73       Past Medical History:  Diagnosis Date   Acute renal failure ([place]) 11/27/2010   Adrenal adenoma    Arthritis    Asthma    Colitis, eosinophilic 97/94/8016   Common bile duct stone    Duodenal ulcer    Eosinophilia 55/37/4827   Eosinophilic enteritis 07/8673   Fatty liver 11/2010   Gastritis    GERD (gastroesophageal reflux disease)    HOCM (hypertrophic obstructive cardiomyopathy) ([place]) 12/10/2019   Hx: UTI (urinary tract infection)    Hypertension    Inguinal hernia    Morbid obesity ([place]) 12/10/2019   NSVT (nonsustained ventricular tachycardia) ([place]) 03/10/2020   Obesity, Class III, BMI 40-49.9 (morbid obesity) ([place])    Periumbilical hernia    Upper GI bleed 09/11/2020     Family History  Problem Relation Age of Onset   Diabetes Mother    Colon cancer Father        ?   Heart attack Father    Diabetes Sister      Current Outpatient Medications:    acetaminophen (TYLENOL) 500 MG tablet, Take 2 tablets (1,000 mg total) by mouth every 6 (six) hours as needed (For pain)., Disp: , Rfl: 0   albuterol (VENTOLIN HFA) 108 (90 Base) MCG/ACT inhaler, Inhale 2 puffs into the lungs every 6 (six) hours as needed. For asthma, Disp: 1 each, Rfl: 3   apixaban (ELIQUIS) 5 MG TABS tablet, Take 1 tablet by mouth twice daily, Disp: 180 tablet, Rfl: 1   carvedilol  (COREG) 25 MG tablet, Take 1 tablet (25 mg total) by mouth 2 (two) times daily., Disp: 180 tablet, Rfl: 0   diclofenac Sodium (VOLTAREN) 1 % GEL, Apply 2 g topically daily as needed (For pain)., Disp: 50 g, Rfl: 1   ferrous sulfate 325 (65 FE) MG EC tablet, Take 1 tablet (325 mg total) by mouth 2 (two) times daily with a meal., Disp: 180 tablet, Rfl: 2   furosemide (LASIX) 80 MG tablet, Take 1 tablet by mouth once daily, Disp: 90 tablet, Rfl: 2   gabapentin (NEURONTIN) 300 MG capsule, Take 1 capsule by mouth twice daily, Disp: 180 capsule, Rfl: 0   Magnesium 250 MG TABS, Take 250 mg by mouth daily at 2 PM., Disp: , Rfl:    melatonin 5 MG TABS, Take 5 mg by mouth daily at 2 PM., Disp: , Rfl:    Multiple Vitamin (MULTIVITAMIN WITH MINERALS) TABS tablet, Take 1 tablet by mouth daily., Disp: , Rfl:    potassium chloride SA (KLOR-CON M) 20 MEQ tablet, Take 1 tablet (20 mEq total) by mouth daily., Disp: 90 tablet, Rfl: 2   traMADol (ULTRAM) 50 MG tablet, TAKE 1 TABLET BY MOUTH EVERY 12 HOURS AS NEEDED FOR  SEVERE  PAIN  OR  MODERATE  PAIN, Disp: 20 tablet, Rfl: 0   amLODipine (NORVASC) 10 MG tablet, Take 1 tablet by mouth once daily, Disp: 90 tablet, Rfl: 0   FARXIGA 10 MG TABS tablet, TAKE 1 TABLET BY MOUTH ONCE DAILY BEFORE BREAKFAST, Disp: 90 tablet, Rfl: 0   metFORMIN (GLUCOPHAGE) 500 MG tablet, Take 1 tablet (500 mg total) by mouth 2 (two) times daily with a meal., Disp: 180 tablet, Rfl: 1   pantoprazole (PROTONIX) 40 MG tablet, Take 1 tablet (40 mg total) by mouth 2 (two) times daily., Disp: 180 tablet, Rfl: 2   No Known Allergies    The patient states she is status post hysterectomy.  No LMP recorded. Patient has had a hysterectomy.. Negative for Dysmenorrhea and Negative for Menorrhagia. Negative for: breast discharge, breast lump(s), breast pain and breast self exam. Associated symptoms include abnormal vaginal bleeding. Pertinent negatives include abnormal bleeding (hematology), anxiety,  decreased libido, depression, difficulty falling sleep, dyspareunia, history of infertility, nocturia, sexual dysfunction, sleep disturbances, urinary incontinence, urinary urgency, vaginal discharge and vaginal itching. Diet regular; she is trying to limit her starches and bread. The patient states her exercise level is moderate walking at work; continues to work part time.  The patient's tobacco use is:  Social History   Tobacco Use  Smoking Status Every Day   Packs/day: 0.25   Years: 30.00   Total pack years: 7.50   Types: Cigarettes  Smokeless Tobacco Never  Tobacco Comments   she is less than a PPD - states down to 6 cigarettes a day - 10/14, reports smoking 1 PPD for 10 years; she has cut down to 3 a day   She has been exposed to passive smoke. The patient's alcohol use is:  Social History   Substance and Sexual Activity  Alcohol Use No    Review of Systems  Constitutional: Negative.   HENT: Negative.    Eyes: Negative.   Respiratory: Negative.    Cardiovascular: Negative.   Gastrointestinal: Negative.   Endocrine: Negative.   Genitourinary: Negative.   Musculoskeletal: Negative.   Skin: Negative.   Allergic/Immunologic: Negative.   Hematological: Negative.   Psychiatric/Behavioral: Negative.       Today's Vitals   08/10/21 1009  BP: 122/60  Pulse: 77  Temp: 98.4 F (36.9 C)  TempSrc: Oral  Weight: 246 lb 6.4 oz (111.8 kg)  Height: 5' 6"  (1.676 m)  PainSc: 0-No pain   Body mass index is 39.77 kg/m.   Objective:  Physical Exam Vitals reviewed.  Constitutional:      General: She is not in acute distress.    Appearance: Normal appearance. She is well-developed. She is obese.  HENT:     Head: Normocephalic and atraumatic.     Right Ear: Hearing and external ear normal. There is impacted cerumen.     Left Ear: Hearing, tympanic membrane, ear canal and external ear normal. There is no impacted cerumen.     Nose: Nose normal.     Mouth/Throat:     Mouth:  Mucous membranes are moist.  Eyes:     General: Lids are normal.     Extraocular Movements: Extraocular movements intact.     Conjunctiva/sclera: Conjunctivae normal.     Pupils: Pupils are equal, round, and reactive to light.     Funduscopic exam:    Right eye: No papilledema.        Left eye: No papilledema.  Neck:     Thyroid: No thyroid mass.  Vascular: No carotid bruit.  Cardiovascular:     Rate and Rhythm: Normal rate and regular rhythm.     Pulses: Normal pulses.     Heart sounds: Normal heart sounds. No murmur heard. Pulmonary:     Effort: Pulmonary effort is normal. No respiratory distress.     Breath sounds: Normal breath sounds. No wheezing.  Chest:     Chest wall: No mass.  Breasts:    Tanner Score is 5.     Right: Normal. No mass or tenderness.     Left: Normal. No mass or tenderness.  Abdominal:     General: Abdomen is flat. Bowel sounds are normal. There is no distension.     Palpations: Abdomen is soft.     Tenderness: There is no abdominal tenderness.  Genitourinary:    Rectum: Guaiac result negative.  Musculoskeletal:        General: No swelling. Normal range of motion.     Cervical back: Full passive range of motion without pain, normal range of motion and neck supple.     Right lower leg: No edema.     Left lower leg: No edema.  Lymphadenopathy:     Upper Body:     Right upper body: No supraclavicular, axillary or pectoral adenopathy.     Left upper body: No supraclavicular, axillary or pectoral adenopathy.  Skin:    General: Skin is warm and dry.     Capillary Refill: Capillary refill takes less than 2 seconds.  Neurological:     General: No focal deficit present.     Mental Status: She is alert and oriented to person, place, and time.     Cranial Nerves: No cranial nerve deficit.     Sensory: No sensory deficit.  Psychiatric:        Mood and Affect: Mood normal.        Behavior: Behavior normal.        Thought Content: Thought content  normal.        Judgment: Judgment normal.         Assessment And Plan:     1. Annual physical exam Behavior modifications discussed and diet history reviewed.   Pt will continue to exercise regularly and modify diet with low GI, plant based foods and decrease intake of processed foods.  Recommend intake of daily multivitamin, Vitamin D, and calcium.  Recommend mammogram and colonoscopy for preventive screenings, as well as recommend immunizations that include influenza, TDAP, and Shingles  2. Class 2 obesity due to excess calories without serious comorbidity with body mass index (BMI) of 39.0 to 39.9 in adult She is encouraged to strive for BMI less than 30 to decrease cardiac risk. Advised to aim for at least 150 minutes of exercise per week.  3. Encounter for screening mammogram for breast cancer Pt instructed on Self Breast Exam.According to ACOG guidelines Women aged 55 and older are recommended to get an annual mammogram. Form completed and given to patient contact the [facility] for appointment scheduing.  Pt encouraged to get annual mammogram - MM Digital Screening; Future  4. Tobacco use disorder Smoking cessation instruction/counseling given:  counseled patient on the dangers of tobacco use, advised patient to stop smoking, and reviewed strategies to maximize success  5. Immunization due - Tdap vaccine greater than or equal to 7yo IM  6. Hypertensive heart disease without heart failure Comments: Blood pressure is well controlled, continue current medications. Continue low salt diet. EKG done, continue follow  up with Cardiology Sinus  Rhythm  -Incomplete left bundle branch block.   -Left atrial enlargement.   Voltage criteria for LVH  (R(I)+S(III) exceeds 2.50 mV)  -Voltage criteria w/o ST/T abnormality may be normal.   -Nonspecific ST depression   +   T-abnormality  -Nondiagnostic  -Possible  Anterolateral  ischemia.  Rate 66 - EKG 12-Lead - CMP14+EGFR -  Microalbumin / Creatinine Urine Ratio - POCT Urinalysis Dipstick (81002)  7. Low hemoglobin Comments: Levels had improved, will recheck to ensure continues to be stable.  - CBC no Diff  8. Prediabetes Comments: Stable, continue medications, also on Farxiga for congestive heart failure - CMP14+EGFR - Hemoglobin A1c  9. Atherosclerosis of aorta (HCC) Comments: Continue statin.  - CMP14+EGFR - Lipid panel  10. Impacted cerumen of right ear Comments: Water lavage done with good results - Ear Lavage  11. HOCM (hypertrophic obstructive cardiomyopathy) ([place]) Comments: Continue f/u with Cardiology   Patient was given opportunity to ask questions. Patient verbalized understanding of the plan and was able to repeat key elements of the plan. All questions were answered to their satisfaction.   [name], FNP   I, [name], FNP, have reviewed all documentation for this visit. The documentation on 08/10/21 for the exam, diagnosis, procedures, and orders are all accurate and complete.   THE PATIENT IS ENCOURAGED TO PRACTICE SOCIAL DISTANCING DUE TO THE COVID-19 PANDEMIC.

## 2021-08-11 ENCOUNTER — Other Ambulatory Visit: Payer: Self-pay | Admitting: Cardiology

## 2021-08-11 LAB — CMP14+EGFR
ALT: 8 IU/L (ref 0–32)
AST: 12 IU/L (ref 0–40)
Albumin/Globulin Ratio: 1.4 (ref 1.2–2.2)
Albumin: 4.1 g/dL (ref 3.8–4.9)
Alkaline Phosphatase: 81 IU/L (ref 44–121)
BUN/Creatinine Ratio: 13 (ref 9–23)
BUN: 10 mg/dL (ref 6–24)
Bilirubin Total: 0.3 mg/dL (ref 0.0–1.2)
CO2: 28 mmol/L (ref 20–29)
Calcium: 10.8 mg/dL — ABNORMAL HIGH (ref 8.7–10.2)
Chloride: 99 mmol/L (ref 96–106)
Creatinine, Ser: 0.77 mg/dL (ref 0.57–1.00)
Globulin, Total: 2.9 g/dL (ref 1.5–4.5)
Glucose: 88 mg/dL (ref 70–99)
Potassium: 4.9 mmol/L (ref 3.5–5.2)
Sodium: 142 mmol/L (ref 134–144)
Total Protein: 7 g/dL (ref 6.0–8.5)
eGFR: 90 mL/min/{1.73_m2} (ref >59)

## 2021-08-11 LAB — LIPID PANEL
Chol/HDL Ratio: 2.7 ratio (ref 0.0–4.4)
Cholesterol, Total: 195 mg/dL (ref 100–199)
HDL: 72 mg/dL (ref >39)
LDL Chol Calc (NIH): 100 mg/dL — ABNORMAL HIGH (ref 0–99)
Triglycerides: 132 mg/dL (ref 0–149)
VLDL Cholesterol Cal: 23 mg/dL (ref 5–40)

## 2021-08-11 LAB — HEMOGLOBIN A1C
Est. average glucose Bld gHb Est-mCnc: 108 mg/dL
Hgb A1c MFr Bld: 5.4 % (ref 4.8–5.6)

## 2021-08-11 LAB — CBC
Hematocrit: 41.9 % (ref 34.0–46.6)
Hemoglobin: 13.6 g/dL (ref 11.1–15.9)
MCH: 30 pg (ref 26.6–33.0)
MCHC: 32.5 g/dL (ref 31.5–35.7)
MCV: 93 fL (ref 79–97)
Platelets: 248 10*3/uL (ref 150–450)
RBC: 4.53 x10E6/uL (ref 3.77–5.28)
RDW: 12.5 % (ref 11.7–15.4)
WBC: 6.9 10*3/uL (ref 3.4–10.8)

## 2021-08-11 LAB — MICROALBUMIN / CREATININE URINE RATIO
Creatinine, Urine: 80.7 mg/dL
Microalb/Creat Ratio: 5 mg/g creat (ref 0–29)
Microalbumin, Urine: 4.4 ug/mL

## 2021-08-11 NOTE — Telephone Encounter (Signed)
This is Dr. Blenda Mounts pt. Medication was D/C off of pt's medication list. Does pt suppose to still be taking medication? Please address

## 2021-08-11 NOTE — Telephone Encounter (Signed)
Rx request sent to pharmacy.  

## 2021-08-11 NOTE — Telephone Encounter (Signed)
Upon further review, discovered Amlodipine was discontinued by [facility] on 04/20/21.   BP and recent visit with PCP was 122/60. Melinda, LPN advised pt should most likely continue taking.   Spoke to pt who stated she is still taking amlodipine and no one has told her to stop taking it. Rx sent in for pt who verbalized thanks.

## 2021-08-11 NOTE — Telephone Encounter (Signed)
Follow Up:      Patient is returning Alexandra Bell call from today.

## 2021-08-11 NOTE — Telephone Encounter (Signed)
Attempted to call pt to discuss if she is taking this. Left VM to call back.

## 2021-08-11 NOTE — Telephone Encounter (Signed)
Pt has appointment with you on 8/31. Requesting refills for Amlodipine 10 mg. Dr. Quentin Ore increased to 10 mg 06/2020; Oda Kilts said to continue taking when he saw her in April this year. It is now no longer on medication list, appears to have been discontinued but unsure by who. Should pt continue taking? Please advise. Thank you!

## 2021-08-17 ENCOUNTER — Other Ambulatory Visit: Payer: Self-pay | Admitting: Nurse Practitioner

## 2021-08-23 ENCOUNTER — Other Ambulatory Visit: Payer: Self-pay

## 2021-08-23 DIAGNOSIS — R7309 Other abnormal glucose: Secondary | ICD-10-CM

## 2021-08-23 MED ORDER — METFORMIN HCL 500 MG PO TABS: 500.0000 mg | ORAL_TABLET | Freq: Two times a day (BID) | ORAL | 1 refills | Status: DC

## 2021-09-07 ENCOUNTER — Other Ambulatory Visit: Payer: Self-pay | Admitting: Cardiovascular Disease

## 2021-09-07 ENCOUNTER — Telehealth (HOSPITAL_BASED_OUTPATIENT_CLINIC_OR_DEPARTMENT_OTHER): Payer: Self-pay | Admitting: *Deleted

## 2021-09-07 ENCOUNTER — Encounter (HOSPITAL_BASED_OUTPATIENT_CLINIC_OR_DEPARTMENT_OTHER): Payer: Self-pay | Admitting: Cardiovascular Disease

## 2021-09-07 ENCOUNTER — Ambulatory Visit (INDEPENDENT_AMBULATORY_CARE_PROVIDER_SITE_OTHER): Payer: Commercial Managed Care - HMO | Admitting: Cardiovascular Disease

## 2021-09-07 VITALS — BP 142/68 | HR 84 | Ht 66.0 in | Wt 245.0 lb

## 2021-09-07 DIAGNOSIS — F17200 Nicotine dependence, unspecified, uncomplicated: Secondary | ICD-10-CM

## 2021-09-07 DIAGNOSIS — I421 Obstructive hypertrophic cardiomyopathy: Secondary | ICD-10-CM | POA: Diagnosis not present

## 2021-09-07 DIAGNOSIS — I480 Paroxysmal atrial fibrillation: Secondary | ICD-10-CM

## 2021-09-07 DIAGNOSIS — I10 Essential (primary) hypertension: Secondary | ICD-10-CM | POA: Diagnosis not present

## 2021-09-07 DIAGNOSIS — Z5181 Encounter for therapeutic drug level monitoring: Secondary | ICD-10-CM

## 2021-09-07 MED ORDER — SACUBITRIL-VALSARTAN 24-26 MG PO TABS
1.0000 | ORAL_TABLET | Freq: Two times a day (BID) | ORAL | 3 refills | Status: DC
Start: 2021-09-07 — End: 2021-10-05

## 2021-09-07 NOTE — Telephone Encounter (Signed)
Entresto 24-26 mg #1 lot [id number] esp 8/25 Given to patient at visit

## 2021-09-07 NOTE — Telephone Encounter (Signed)
Eliquis '5mg'$  refill request received. Patient is 56 years old, weight-111.1kg, Crea-0.77 on 08/10/2021, Diagnosis-Afib, and last seen by Dr. Oval Linsey on today, 09/07/21. Dose is appropriate based on dosing criteria. Will send in refill to requested pharmacy.

## 2021-09-07 NOTE — Patient Instructions (Signed)
Medication Instructions:  START ENTRESTO 24-26 MG TWICE A DAY   *If you need a refill on your cardiac medications before your next appointment, please call your pharmacy*  Lab Work: BMET IN 1 WEEK   If you have labs (blood work) drawn today and your tests are completely normal, you will receive your results only by: [place] (if you have MyChart) OR A paper copy in the mail If you have any lab test that is abnormal or we need to change your treatment, we will call you to review the results.  Testing/Procedures: Your physician has requested that you have an echocardiogram. Echocardiography is a painless test that uses sound waves to create images of your heart. It provides your doctor with information about the size and shape of your heart and how well your heart's chambers and valves are working. This procedure takes approximately one hour. There are no restrictions for this procedure.]  Follow-Up: At [facility], you and your health needs are our priority.  As part of our continuing mission to provide you with exceptional heart care, we have created designated Provider Care Teams.  These Care Teams include your primary Cardiologist (physician) and Advanced Practice Providers (APPs -  Physician Assistants and Nurse Practitioners) who all work together to provide you with the care you need, when you need it.  We recommend signing up for the patient portal called "MyChart".  Sign up information is provided on this After Visit Summary.  MyChart is used to connect with patients for Virtual Visits (Telemedicine).  Patients are able to view lab/test results, encounter notes, upcoming appointments, etc.  Non-urgent messages can be sent to your provider as well.   To learn more about what you can do with MyChart, go to [email or web address].    Your next appointment:   6 month(s)  The format for your next appointment:   In Person  Provider:   Skeet Latch, MD    1-2  MONTHS WITH PHARM D

## 2021-09-07 NOTE — Progress Notes (Signed)
Cardiology Office Note   Date:  09/07/2021   ID:  [name], DOB 06/08/1965, MRN [id number]  PCP:  Alexandra Brine, FNP  Cardiologist:   Alexandra Latch, MD  EP: Dr. Quentin Bell  No chief complaint on file.  History of Present Illness: Alexandra Bell is a 56 y.o. female with HOCM status post ICD, paroxysmal atrial fibrillation, hypertension, asthma, GERD,  and obesity here for follow up.  She saw her PCP and had LE edema and was noted to have a heart murmur on exam.  She reports that she has had a murmur for many years.  BNP at that time was 451.  She had an echo 11/2019 that revealed LVEF 65 to 70% with severe LVH and grade 2 diastolic dysfunction.  She was referred for cardiac MRI which revealed LVEF 56% with extensive mid wall late gadolinium enhancement in the basal to mid anteroseptum, mid to apical anterior, and mid to apical inferolateral myocardium.  She wore an ambulatory monitor that did reveal episodes of NSVT and PAF.  She was referred to EP for consideration of an ICD which she had implanted on 03/2020.  When she saw her PCP in January BNP was elevated to 893.  She was started on Farxiga.   At her last visit her blood pressure was uncontrolled.  Metoprolol switched to carvedilol and amlodipine was increased.  Furosemide was also increased.  She followed up with Alexandra Bell 04/2020 and was doing well.  Her blood pressure was high in the office but she had been unable to refill her carvedilol at the pharmacy.  Alexandra Bell reports exercising by walking twice a week for 15 minutes and experiences no significant issues during these walks, although some days are affected by knee pain. She denies any shortness of breath while walking, chest pain, or pressure. She also denies experiencing shortness of breath when lying down and has not had any recent swelling. The patient's self-monitored blood pressure at home is approximately 140/70-76.  She mostly cooks at home and tries to watch  her salt intake. She reports a current smoking habit of three to five cigarettes per day, with a goal of gradually cutting back. The patient has not experienced any recent episodes of atrial fibrillation or palpitations.  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/9935   Fatty liver 11/2010   Gastritis    GERD (gastroesophageal reflux disease)    HOCM (hypertrophic obstructive cardiomyopathy) ([place]) 12/10/2019   Hx: UTI (urinary tract infection)    Hypertension    Inguinal hernia    Morbid obesity ([place]) 12/10/2019   NSVT (nonsustained ventricular tachycardia) ([place]) 03/10/2020   Obesity, Class III, BMI 40-49.9 (morbid obesity) ([place])    Periumbilical hernia    Upper GI bleed 09/11/2020    Past Surgical History:  Procedure Laterality Date   ABDOMINAL HYSTERECTOMY     BIOPSY  09/13/2020   Procedure: BIOPSY;  Surgeon: Jackquline Denmark, MD;  Location: [facility] ENDOSCOPY;  Service: Endoscopy;;   BIOPSY  10/05/2020   Procedure: BIOPSY;  Surgeon: Jerene Bears, MD;  Location: [facility] ENDOSCOPY;  Service: Gastroenterology;;   BRONCHOSCOPY, LYMPH NODE [place]  MAY 2011   CHOLECYSTECTOMY  2001   COLONOSCOPY N/A 09/13/2020  Procedure: COLONOSCOPY;  Surgeon: Jackquline Denmark, MD;  Location: [facility] ENDOSCOPY;  Service: Endoscopy;  Laterality: N/A;   ERCP/SPHINCTEROTOMY/STONE EXTRACTION  2001   ESOPHAGOGASTRODUODENOSCOPY (EGD) WITH PROPOFOL N/A 09/13/2020   Procedure: ESOPHAGOGASTRODUODENOSCOPY (EGD) WITH PROPOFOL;  Surgeon: Jackquline Denmark, MD;  Location: [facility] ENDOSCOPY;  Service: Endoscopy;  Laterality: N/A;   ESOPHAGOGASTRODUODENOSCOPY (EGD) WITH PROPOFOL N/A 10/05/2020   Procedure: ESOPHAGOGASTRODUODENOSCOPY (EGD) WITH PROPOFOL;  Surgeon: Jerene Bears, MD;  Location: [facility] ENDOSCOPY;  Service: Gastroenterology;   Laterality: N/A;   FLEXIBLE SIGMOIDOSCOPY  11/29/2010   Procedure: FLEXIBLE SIGMOIDOSCOPY;  Surgeon: Lafayette Dragon, MD;  Location: [facility] ENDOSCOPY;  Service: Endoscopy;  Laterality: N/A;   HEMOSTASIS CLIP PLACEMENT  10/05/2020   Procedure: HEMOSTASIS CLIP PLACEMENT;  Surgeon: Jerene Bears, MD;  Location: [facility] ENDOSCOPY;  Service: Gastroenterology;;   ICD IMPLANT N/A 03/25/2020   Procedure: ICD IMPLANT;  Surgeon: Vickie Epley, MD;  Location: [facility];  Service: Cardiovascular;  Laterality: N/A;     Current Outpatient Medications  Medication Sig Dispense Refill   acetaminophen (TYLENOL) 500 MG tablet Take 2 tablets (1,000 mg total) by mouth every 6 (six) hours as needed (For pain).  0   albuterol (VENTOLIN HFA) 108 (90 Base) MCG/ACT inhaler Inhale 2 puffs into the lungs every 6 (six) hours as needed. For asthma 1 each 3   amLODipine (NORVASC) 10 MG tablet Take 1 tablet by mouth once daily 90 tablet 0   carvedilol (COREG) 25 MG tablet Take 1 tablet (25 mg total) by mouth 2 (two) times daily. 180 tablet 0   diclofenac Sodium (VOLTAREN) 1 % GEL Apply 2 g topically daily as needed (For pain). 50 g 1   FARXIGA 10 MG TABS tablet TAKE 1 TABLET BY MOUTH ONCE DAILY BEFORE BREAKFAST 90 tablet 0   ferrous sulfate 325 (65 FE) MG EC tablet Take 1 tablet (325 mg total) by mouth 2 (two) times daily with a meal. 180 tablet 2   furosemide (LASIX) 80 MG tablet Take 1 tablet by mouth once daily 90 tablet 2   gabapentin (NEURONTIN) 300 MG capsule Take 1 capsule by mouth twice daily 180 capsule 0   Magnesium 250 MG TABS Take 250 mg by mouth daily at 2 PM.     melatonin 5 MG TABS Take 5 mg by mouth daily at 2 PM.     metFORMIN (GLUCOPHAGE) 500 MG tablet Take 1 tablet (500 mg total) by mouth 2 (two) times daily with a meal. 180 tablet 1   Multiple Vitamin (MULTIVITAMIN WITH MINERALS) TABS tablet Take 1 tablet by mouth daily.     pantoprazole (PROTONIX) 40 MG tablet Take 1 tablet (40 mg total) by mouth 2  (two) times daily. 180 tablet 2   potassium chloride SA (KLOR-CON M) 20 MEQ tablet Take 1 tablet (20 mEq total) by mouth daily. 90 tablet 2   sacubitril-valsartan (ENTRESTO) 24-26 MG Take 1 tablet by mouth 2 (two) times daily. 180 tablet 3   traMADol (ULTRAM) 50 MG tablet TAKE 1 TABLET BY MOUTH EVERY 12 HOURS AS NEEDED FOR  SEVERE  PAIN  OR  MODERATE  PAIN 20 tablet 0   apixaban (ELIQUIS) 5 MG TABS tablet Take 1 tablet by mouth twice daily 180 tablet 1   No current facility-administered medications for this visit.    Allergies:   Patient has no known allergies.    Social History:  The patient  reports that she has been smoking cigarettes. She has a 7.50 pack-year  smoking history. She has never used smokeless tobacco. She reports that she does not drink alcohol and does not use drugs.   Family History:  The patient's family history includes Colon cancer in her father; Diabetes in her mother and sister; Heart attack in her father.    ROS:  Please see the history of present illness.   Otherwise, review of systems are positive for none.   All other systems are reviewed and negative.    PHYSICAL EXAM: VS:  BP (!) 142/68   Pulse 84   Ht '5\' 6"'$  (1.676 m)   Wt 245 lb (111.1 kg)   BMI 39.54 kg/m  , BMI Body mass index is 39.54 kg/m. GENERAL:  Well appearing HEENT:  Pupils equal round and reactive, fundi not visualized, oral mucosa unremarkable NECK:  No JVD, waveform within normal limits, carotid upstroke brisk and symmetric, no bruits LUNGS:  Clear to auscultation bilaterally HEART:  RRR.  PMI not displaced or sustained,S1 and S2 within normal limits, no S3, no S4, no clicks, no rubs, III/VI systolic murmurs at the LUSB.  Increases with Valsalva and hand grip. ABD:  Flat, positive bowel sounds normal in frequency in pitch, no bruits, no rebound, no guarding, no midline pulsatile mass, no hepatomegaly, no splenomegaly EXT:  2 plus pulses throughout, trace LE edema to the upper tibia  bilaterally, no cyanosis no clubbing SKIN:  No rashes no nodules NEURO:  Cranial nerves II through XII grossly intact, motor grossly intact throughout PSYCH:  Cognitively intact, oriented to person place and time  EKG:  EKG is not ordered today. The ekg ordered 12/10/19 demonstrates sinus rhythm.  Rate 74 bpm.  LVH.  Ventricular fusion complexes.    Echo 11/2019: 1. Left ventricular ejection fraction, by estimation, is 65 to 70%. The  left ventricle has normal function. The left ventricle has no regional  wall motion abnormalities. There is severe concentric left ventricular  hypertrophy. Left ventricular diastolic   parameters are consistent with Grade II diastolic dysfunction  (pseudonormalization).   2. Right ventricular systolic function is normal. The right ventricular  size is normal.   3. Left atrial size was moderately dilated.   4. Right atrial size was moderately dilated.   5. The mitral valve is normal in structure. No evidence of mitral valve  regurgitation.   6. The aortic valve is normal in structure. Aortic valve regurgitation is  not visualized.   FINDINGS   Left Ventricle: Left ventricular ejection fraction, by estimation, is 65  to 70%. The left ventricle has normal function. The left ventricle has no  regional wall motion abnormalities. Definity contrast agent was given IV  to delineate the left ventricular   endocardial borders. The left ventricular internal cavity size was small.  There is severe concentric left ventricular hypertrophy. Left ventricular  diastolic parameters are consistent with Grade II diastolic dysfunction  (pseudonormalization).   Cardiac MRI 01/2020: IMPRESSION: 1. Normal LV size with severe asymmetric basal to mid septal hypertrophy, EF 56% with normal wall motion. Very mild mitral valve SAM without significant regurgitation. The LGE pattern and low ECV percentage are suggestive of hypertrophic cardiomyopathy. The extensive LGE does  suggest a higher risk for ventricular arrhythmias.   2.  Normal right ventricular size and systolic function.   This study is suggestive of hypertrophic cardiomyopathy.  Recent Labs: 09/11/2020: TSH 1.842 10/04/2020: B Natriuretic Peptide 1,046.8 08/10/2021: ALT 8; BUN 10; Creatinine, Ser 0.77; Hemoglobin 13.6; Platelets 248; Potassium 4.9; Sodium 142  Lipid Panel    Component Value Date/Time   CHOL 195 08/10/2021 1130   TRIG 132 08/10/2021 1130   HDL 72 08/10/2021 1130   CHOLHDL 2.7 08/10/2021 1130   LDLCALC 100 (H) 08/10/2021 1130      Wt Readings from Last 3 Encounters:  09/07/21 245 lb (111.1 kg)  08/10/21 246 lb 6.4 oz (111.8 kg)  06/15/21 237 lb (107.5 kg)      ASSESSMENT AND PLAN:  # Hypertrophic Cardiomyopathy (HOCM) - Plan: Recheck echocardiogram to assess LV thickness and monitor for any significant changes. Continue monitoring with Dr. Quentin Bell for defibrillator management.    # Hypertension: - Assessment: Blood pressure at home around 140/70-76, current medications at maximum dosages. - Plan: Add Entresto 24/26 mg twice a day to help with blood pressure control and diastolic function. Obtain BMP in one week to check potassium levels and ensure stability.  # Atrial Fibrillation - Assessment: No recent episodes reported, patient on Eliquis and Carvedilol for protection and rate control. - Plan: Continue current medications and monitor for any changes in symptoms or episodes.  # Hyperlipidemia - Assessment: Cholesterol numbers within target range. - Plan: Continue current management and monitor lipid profile periodically.  # Smoking Cessation - Assessment: Patient has reduced smoking to 3-5 cigarettes per day and is working on cutting back further. - Plan: Encourage patient to continue reducing cigarette consumption by one per week or every two weeks. Consider using nicotine patches to aid in the transition.    Follow-up appointment scheduled for six months.  Patient advised to contact the clinic if any concerns or issues arise before the scheduled follow-up.  Current medicines are reviewed at length with the patient today.  The patient does not have concerns regarding medicines.  The following changes have been made: Start Entresto  Labs/ tests ordered today include:   Orders Placed This Encounter  Procedures   Basic metabolic panel   AMB Referral to [facility]   ECHOCARDIOGRAM COMPLETE    Disposition:   FU with APP in 1 month.  Rayquon Uselman C. Oval Linsey, MD, [facility] in 6 months.     Signed, Lichelle Viets C. Oval Linsey, MD, [facility]  09/07/2021 12:47 PM    [place]

## 2021-09-07 NOTE — Telephone Encounter (Signed)
Please review for refill. Thank you! 

## 2021-09-14 ENCOUNTER — Ambulatory Visit: Payer: Commercial Managed Care - HMO | Attending: Cardiovascular Disease

## 2021-09-14 LAB — BASIC METABOLIC PANEL
BUN/Creatinine Ratio: 20 (ref 9–23)
BUN: 16 mg/dL (ref 6–24)
CO2: 24 mmol/L (ref 20–29)
Calcium: 10.2 mg/dL (ref 8.7–10.2)
Chloride: 103 mmol/L (ref 96–106)
Creatinine, Ser: 0.8 mg/dL (ref 0.57–1.00)
Glucose: 123 mg/dL — ABNORMAL HIGH (ref 70–99)
Potassium: 4.1 mmol/L (ref 3.5–5.2)
Sodium: 140 mmol/L (ref 134–144)
eGFR: 86 mL/min/{1.73_m2} (ref >59)

## 2021-09-18 ENCOUNTER — Telehealth: Payer: Self-pay | Admitting: Cardiovascular Disease

## 2021-09-18 NOTE — Telephone Encounter (Signed)
Returned call to patient to clarify the form that was needed for her Eliquis. Patient needing a prior authorization form. Will route to Alvina Filbert for prior auth!

## 2021-09-18 NOTE — Telephone Encounter (Signed)
Patient called and mentioned that she needs Dr. Oval Linsey to fill out a PLA for the patient's medication  apixaban (ELIQUIS) 5 MG TABS tablet

## 2021-09-19 NOTE — Telephone Encounter (Addendum)
Approvedtoday [id number];Review Type:Prior Auth;Coverage Start Date:09/19/2021;Coverage End Date:09/19/2022;  Above copied from Covermymeds  Tried to call patient, went to [employer] approved and called [employer]

## 2021-09-21 ENCOUNTER — Ambulatory Visit (HOSPITAL_COMMUNITY): Payer: Commercial Managed Care - HMO | Attending: Cardiovascular Disease

## 2021-09-21 DIAGNOSIS — I10 Essential (primary) hypertension: Secondary | ICD-10-CM | POA: Diagnosis present

## 2021-09-21 DIAGNOSIS — I421 Obstructive hypertrophic cardiomyopathy: Secondary | ICD-10-CM | POA: Diagnosis not present

## 2021-09-21 DIAGNOSIS — I480 Paroxysmal atrial fibrillation: Secondary | ICD-10-CM | POA: Insufficient documentation

## 2021-09-21 LAB — ECHOCARDIOGRAM COMPLETE
Area-P 1/2: 2.33 cm2
S' Lateral: 1.3 cm

## 2021-09-21 MED ORDER — PERFLUTREN LIPID MICROSPHERE
1.0000 mL | INTRAVENOUS | Status: AC | PRN
  Administered 2021-09-21: 1 mL via INTRAVENOUS

## 2021-09-28 ENCOUNTER — Ambulatory Visit (INDEPENDENT_AMBULATORY_CARE_PROVIDER_SITE_OTHER): Payer: Commercial Managed Care - HMO

## 2021-09-28 DIAGNOSIS — Z95810 Presence of automatic (implantable) cardiac defibrillator: Secondary | ICD-10-CM

## 2021-09-28 LAB — CUP PACEART REMOTE DEVICE CHECK
Battery Remaining Longevity: 150 mo
Battery Remaining Percentage: 100 %
Brady Statistic RA Percent Paced: 8 %
Brady Statistic RV Percent Paced: 1 %
Date Time Interrogation Session: 20230921005100
HighPow Impedance: 64 Ohm
Implantable Lead Implant Date: 20220318
Implantable Lead Implant Date: 20220318
Implantable Lead Location: 753859
Implantable Lead Location: 753860
Implantable Lead Model: 673
Implantable Lead Model: 7841
Implantable Lead Serial Number: 1122351
Implantable Lead Serial Number: 158410
Implantable Pulse Generator Implant Date: 20220318
Lead Channel Impedance Value: 434 Ohm
Lead Channel Impedance Value: 565 Ohm
Lead Channel Setting Pacing Amplitude: 2 V
Lead Channel Setting Pacing Amplitude: 2.5 V
Lead Channel Setting Pacing Pulse Width: 0.4 ms
Lead Channel Setting Sensing Sensitivity: 0.5 mV
Pulse Gen Serial Number: 593724

## 2021-10-05 ENCOUNTER — Ambulatory Visit: Payer: Commercial Managed Care - HMO | Attending: Cardiovascular Disease | Admitting: Pharmacist

## 2021-10-05 ENCOUNTER — Encounter: Payer: Self-pay | Admitting: Pharmacist

## 2021-10-05 VITALS — BP 121/72 | HR 64

## 2021-10-05 DIAGNOSIS — I10 Essential (primary) hypertension: Secondary | ICD-10-CM | POA: Diagnosis not present

## 2021-10-05 DIAGNOSIS — I421 Obstructive hypertrophic cardiomyopathy: Secondary | ICD-10-CM

## 2021-10-05 MED ORDER — SACUBITRIL-VALSARTAN 24-26 MG PO TABS: 1.0000 | ORAL_TABLET | Freq: Two times a day (BID) | ORAL | 5 refills | Status: DC

## 2021-10-05 NOTE — Progress Notes (Signed)
Patient ID: Alexandra Bell                 DOB: 20-Sep-1965                      MRN: [id number]      HPI: Alexandra Bell is a 56 y.o. female referred by Dr. Oval Linsey to HTN clinic. PMH is significant for HOCM, HTN, A Fib, HLD, and smoking. Patient started on Entresto at last visit with Dr Oval Linsey and follow up labs were normal.  Patient presents today in good spirits. Reports no adverse effects to [facility] or other cardiac medications.  Checks blood pressure sporadically. Believes her last reading was 140/89. Uses a wrist cuff. She does not know if it is accurate.  Continues to smoke about 4-5 cigarettes a day.  Knows to avoid salt in her diet.  Current HTN meds:   Amlodipine '10mg'$  daily Carvedilol '25mg'$  BID Furosemide '80mg'$  daily Entresto 24-'26mg'$  BID  BP goal: <130/80  Home BP readings: 142/89  Wt Readings from Last 3 Encounters:  09/07/21 245 lb (111.1 kg)  08/10/21 246 lb 6.4 oz (111.8 kg)  06/15/21 237 lb (107.5 kg)   BP Readings from Last 3 Encounters:  10/05/21 121/72  09/21/21 (!) 145/83  09/07/21 (!) 142/68   Pulse Readings from Last 3 Encounters:  10/05/21 64  09/07/21 84  08/10/21 77    Renal function: CrCl cannot be calculated (Patient's most recent lab result is older than the maximum 21 days allowed.).  Past Medical History:  Diagnosis Date   Acute renal failure ([place]) 11/27/2010   Adrenal adenoma    Arthritis    Asthma    Colitis, eosinophilic 95/62/1308   Common bile duct stone    Duodenal ulcer    Eosinophilia 65/78/4696   Eosinophilic enteritis 29/5284   Fatty liver 11/2010   Gastritis    GERD (gastroesophageal reflux disease)    HOCM (hypertrophic obstructive cardiomyopathy) ([place]) 12/10/2019   Hx: UTI (urinary tract infection)    Hypertension    Inguinal hernia    Morbid obesity ([place]) 12/10/2019   NSVT (nonsustained ventricular tachycardia) ([place]) 03/10/2020   Obesity, Class III, BMI 40-49.9 (morbid obesity) ([place])    Periumbilical hernia     Upper GI bleed 09/11/2020    Current Outpatient Medications on File Prior to Visit  Medication Sig Dispense Refill   acetaminophen (TYLENOL) 500 MG tablet Take 2 tablets (1,000 mg total) by mouth every 6 (six) hours as needed (For pain).  0   albuterol (VENTOLIN HFA) 108 (90 Base) MCG/ACT inhaler Inhale 2 puffs into the lungs every 6 (six) hours as needed. For asthma 1 each 3   amLODipine (NORVASC) 10 MG tablet Take 1 tablet by mouth once daily 90 tablet 0   apixaban (ELIQUIS) 5 MG TABS tablet Take 1 tablet by mouth twice daily 180 tablet 1   carvedilol (COREG) 25 MG tablet Take 1 tablet (25 mg total) by mouth 2 (two) times daily. 180 tablet 0   diclofenac Sodium (VOLTAREN) 1 % GEL Apply 2 g topically daily as needed (For pain). 50 g 1   FARXIGA 10 MG TABS tablet TAKE 1 TABLET BY MOUTH ONCE DAILY BEFORE BREAKFAST 90 tablet 0   ferrous sulfate 325 (65 FE) MG EC tablet Take 1 tablet (325 mg total) by mouth 2 (two) times daily with a meal. 180 tablet 2   furosemide (LASIX) 80 MG tablet Take 1 tablet by mouth once daily  90 tablet 2   gabapentin (NEURONTIN) 300 MG capsule Take 1 capsule by mouth twice daily 180 capsule 0   Magnesium 250 MG TABS Take 250 mg by mouth daily at 2 PM.     melatonin 5 MG TABS Take 5 mg by mouth daily at 2 PM.     metFORMIN (GLUCOPHAGE) 500 MG tablet Take 1 tablet (500 mg total) by mouth 2 (two) times daily with a meal. 180 tablet 1   Multiple Vitamin (MULTIVITAMIN WITH MINERALS) TABS tablet Take 1 tablet by mouth daily.     pantoprazole (PROTONIX) 40 MG tablet Take 1 tablet (40 mg total) by mouth 2 (two) times daily. 180 tablet 2   potassium chloride SA (KLOR-CON M) 20 MEQ tablet Take 1 tablet (20 mEq total) by mouth daily. 90 tablet 2   traMADol (ULTRAM) 50 MG tablet TAKE 1 TABLET BY MOUTH EVERY 12 HOURS AS NEEDED FOR  SEVERE  PAIN  OR  MODERATE  PAIN 20 tablet 0   No current facility-administered medications on file prior to visit.    No Known  Allergies   Assessment/Plan:  1. Hypertension -    Patient BP today 121/72 which is at goal of <130/80 and much improved over previous readings. Continue current medications, no changes needed.  Gave recommendations of [employer] cuffs for home BP monitoring. Also gave nicotine cessation suggestions. Patient interested in nicotine gum in the future. Offered to send in Rx but patient declined. Educated on the chew and park method and gave information for the [place] quit line. Patient voiced understanding.  Continue: Amlodipine '10mg'$  daily Carvedilol '25mg'$  BID Furosemide '80mg'$  daily Entresto 24-'26mg'$  BID Recheck as needed  Karren Cobble, PharmD, [place], [place], [place], [place], [place], [address] Phone: [phone], Fax: [phone]

## 2021-10-05 NOTE — Patient Instructions (Addendum)
It was nice meeting you today  Your blood pressure looks great today.  We would like it to stay less than 130/80  Please continue your: Amlodipine '10mg'$  daily Carvedilol '25mg'$  twice a day Furosemide '80mg'$  daily Entresto 24-'26mg'$  twice a day  The blood pressure cuffs we recommend are made by [employer].  We recommend the Series 3 or higher  If you would like to try the nicotine gum please let me know  Please call with any questions  Karren Cobble, PharmD, [place], [place], [place], [place], [place], [address] Phone: [phone], Fax: [phone]

## 2021-10-09 NOTE — Progress Notes (Signed)
Remote ICD transmission.   

## 2021-11-01 ENCOUNTER — Other Ambulatory Visit: Payer: Self-pay | Admitting: Cardiovascular Disease

## 2021-11-01 NOTE — Telephone Encounter (Signed)
Rx request sent to pharmacy.  

## 2021-11-14 ENCOUNTER — Other Ambulatory Visit: Payer: Self-pay | Admitting: Nurse Practitioner

## 2021-11-16 ENCOUNTER — Other Ambulatory Visit: Payer: Self-pay | Admitting: Nurse Practitioner

## 2021-11-16 DIAGNOSIS — M79642 Pain in left hand: Secondary | ICD-10-CM

## 2021-11-16 DIAGNOSIS — G8929 Other chronic pain: Secondary | ICD-10-CM

## 2021-12-11 ENCOUNTER — Other Ambulatory Visit: Payer: Self-pay

## 2021-12-11 ENCOUNTER — Encounter (HOSPITAL_COMMUNITY): Payer: Self-pay

## 2021-12-11 ENCOUNTER — Inpatient Hospital Stay (HOSPITAL_COMMUNITY)
Admission: EM | Admit: 2021-12-11 | Discharge: 2021-12-14 | DRG: 193 | Disposition: A | Discharge status: Home / Self Care | Payer: Commercial Managed Care - HMO | Attending: Internal Medicine | Admitting: Internal Medicine

## 2021-12-11 ENCOUNTER — Ambulatory Visit (HOSPITAL_COMMUNITY)
Admission: EM | Admit: 2021-12-11 | Discharge: 2021-12-11 | Disposition: A | Discharge status: Home / Self Care | Payer: Commercial Managed Care - HMO | Attending: Internal Medicine | Admitting: Internal Medicine

## 2021-12-11 ENCOUNTER — Encounter (HOSPITAL_COMMUNITY): Payer: Self-pay | Admitting: Emergency Medicine

## 2021-12-11 ENCOUNTER — Emergency Department (HOSPITAL_COMMUNITY): Payer: Commercial Managed Care - HMO

## 2021-12-11 DIAGNOSIS — E119 Type 2 diabetes mellitus without complications: Secondary | ICD-10-CM | POA: Diagnosis present

## 2021-12-11 DIAGNOSIS — K760 Fatty (change of) liver, not elsewhere classified: Secondary | ICD-10-CM | POA: Diagnosis present

## 2021-12-11 DIAGNOSIS — I421 Obstructive hypertrophic cardiomyopathy: Secondary | ICD-10-CM | POA: Diagnosis present

## 2021-12-11 DIAGNOSIS — I480 Paroxysmal atrial fibrillation: Secondary | ICD-10-CM | POA: Diagnosis not present

## 2021-12-11 DIAGNOSIS — J45901 Unspecified asthma with (acute) exacerbation: Secondary | ICD-10-CM | POA: Diagnosis not present

## 2021-12-11 DIAGNOSIS — J441 Chronic obstructive pulmonary disease with (acute) exacerbation: Secondary | ICD-10-CM

## 2021-12-11 DIAGNOSIS — Z1152 Encounter for screening for COVID-19: Secondary | ICD-10-CM

## 2021-12-11 DIAGNOSIS — Z79899 Other long term (current) drug therapy: Secondary | ICD-10-CM

## 2021-12-11 DIAGNOSIS — R0602 Shortness of breath: Secondary | ICD-10-CM

## 2021-12-11 DIAGNOSIS — J9601 Acute respiratory failure with hypoxia: Secondary | ICD-10-CM | POA: Diagnosis not present

## 2021-12-11 DIAGNOSIS — E785 Hyperlipidemia, unspecified: Secondary | ICD-10-CM | POA: Diagnosis present

## 2021-12-11 DIAGNOSIS — J069 Acute upper respiratory infection, unspecified: Secondary | ICD-10-CM

## 2021-12-11 DIAGNOSIS — Z950 Presence of cardiac pacemaker: Secondary | ICD-10-CM

## 2021-12-11 DIAGNOSIS — I5042 Chronic combined systolic (congestive) and diastolic (congestive) heart failure: Secondary | ICD-10-CM | POA: Diagnosis present

## 2021-12-11 DIAGNOSIS — Z7984 Long term (current) use of oral hypoglycemic drugs: Secondary | ICD-10-CM

## 2021-12-11 DIAGNOSIS — J101 Influenza due to other identified influenza virus with other respiratory manifestations: Principal | ICD-10-CM | POA: Diagnosis present

## 2021-12-11 DIAGNOSIS — F17200 Nicotine dependence, unspecified, uncomplicated: Secondary | ICD-10-CM | POA: Diagnosis present

## 2021-12-11 DIAGNOSIS — Z7901 Long term (current) use of anticoagulants: Secondary | ICD-10-CM

## 2021-12-11 DIAGNOSIS — Z833 Family history of diabetes mellitus: Secondary | ICD-10-CM

## 2021-12-11 DIAGNOSIS — I509 Heart failure, unspecified: Secondary | ICD-10-CM

## 2021-12-11 DIAGNOSIS — F17210 Nicotine dependence, cigarettes, uncomplicated: Secondary | ICD-10-CM | POA: Diagnosis present

## 2021-12-11 DIAGNOSIS — K219 Gastro-esophageal reflux disease without esophagitis: Secondary | ICD-10-CM | POA: Diagnosis present

## 2021-12-11 DIAGNOSIS — I10 Essential (primary) hypertension: Secondary | ICD-10-CM | POA: Diagnosis present

## 2021-12-11 DIAGNOSIS — Z8249 Family history of ischemic heart disease and other diseases of the circulatory system: Secondary | ICD-10-CM

## 2021-12-11 DIAGNOSIS — Z6841 Body Mass Index (BMI) 40.0 and over, adult: Secondary | ICD-10-CM

## 2021-12-11 DIAGNOSIS — I110 Hypertensive heart disease with heart failure: Secondary | ICD-10-CM | POA: Diagnosis present

## 2021-12-11 HISTORY — DX: Chronic obstructive pulmonary disease, unspecified: J44.9

## 2021-12-11 LAB — I-STAT VENOUS BLOOD GAS, ED
Acid-Base Excess: 3 mmol/L — ABNORMAL HIGH (ref 0.0–2.0)
Bicarbonate: 27.6 mmol/L (ref 20.0–28.0)
Calcium, Ion: 1.2 mmol/L (ref 1.15–1.40)
HCT: 45 % (ref 36.0–46.0)
Hemoglobin: 15.3 g/dL — ABNORMAL HIGH (ref 12.0–15.0)
O2 Saturation: 90 %
Potassium: 4.2 mmol/L (ref 3.5–5.1)
Sodium: 137 mmol/L (ref 135–145)
TCO2: 29 mmol/L (ref 22–32)
pCO2, Ven: 39.3 mmHg — ABNORMAL LOW (ref 44–60)
pH, Ven: 7.454 — ABNORMAL HIGH (ref 7.25–7.43)
pO2, Ven: 56 mmHg — ABNORMAL HIGH (ref 32–45)

## 2021-12-11 LAB — COMPREHENSIVE METABOLIC PANEL
ALT: 14 U/L (ref 0–44)
AST: 21 U/L (ref 15–41)
Albumin: 3.4 g/dL — ABNORMAL LOW (ref 3.5–5.0)
Alkaline Phosphatase: 63 U/L (ref 38–126)
Anion gap: 8 (ref 5–15)
BUN: 12 mg/dL (ref 6–20)
CO2: 24 mmol/L (ref 22–32)
Calcium: 9.8 mg/dL (ref 8.9–10.3)
Chloride: 106 mmol/L (ref 98–111)
Creatinine, Ser: 0.67 mg/dL (ref 0.44–1.00)
GFR, Estimated: >60 mL/min (ref >60)
Glucose, Bld: 126 mg/dL — ABNORMAL HIGH (ref 70–99)
Potassium: 4.2 mmol/L (ref 3.5–5.1)
Sodium: 138 mmol/L (ref 135–145)
Total Bilirubin: 0.6 mg/dL (ref 0.3–1.2)
Total Protein: 6.5 g/dL (ref 6.5–8.1)

## 2021-12-11 LAB — CBC WITH DIFFERENTIAL/PLATELET
Abs Immature Granulocytes: 0.03 10*3/uL (ref 0.00–0.07)
Basophils Absolute: 0 10*3/uL (ref 0.0–0.1)
Basophils Relative: 0 %
Eosinophils Absolute: 0 10*3/uL (ref 0.0–0.5)
Eosinophils Relative: 0 %
HCT: 43 % (ref 36.0–46.0)
Hemoglobin: 14.2 g/dL (ref 12.0–15.0)
Immature Granulocytes: 0 %
Lymphocytes Relative: 13 %
Lymphs Abs: 1.3 10*3/uL (ref 0.7–4.0)
MCH: 31.1 pg (ref 26.0–34.0)
MCHC: 33 g/dL (ref 30.0–36.0)
MCV: 94.3 fL (ref 80.0–100.0)
Monocytes Absolute: 1.5 10*3/uL — ABNORMAL HIGH (ref 0.1–1.0)
Monocytes Relative: 15 %
Neutro Abs: 7.2 10*3/uL (ref 1.7–7.7)
Neutrophils Relative %: 72 %
Platelets: 225 10*3/uL (ref 150–400)
RBC: 4.56 MIL/uL (ref 3.87–5.11)
RDW: 13.6 % (ref 11.5–15.5)
WBC: 10 10*3/uL (ref 4.0–10.5)
nRBC: 0 % (ref 0.0–0.2)

## 2021-12-11 LAB — RESP PANEL BY RT-PCR (FLU A&B, COVID) ARPGX2
Influenza A by PCR: POSITIVE — AB
Influenza B by PCR: NEGATIVE
SARS Coronavirus 2 by RT PCR: NEGATIVE

## 2021-12-11 LAB — BRAIN NATRIURETIC PEPTIDE: B Natriuretic Peptide: 1127.5 pg/mL — ABNORMAL HIGH (ref 0.0–100.0)

## 2021-12-11 LAB — CBG MONITORING, ED: Glucose-Capillary: 149 mg/dL — ABNORMAL HIGH (ref 70–99)

## 2021-12-11 MED ORDER — SACUBITRIL-VALSARTAN 24-26 MG PO TABS
1.0000 | ORAL_TABLET | Freq: Two times a day (BID) | ORAL | Status: DC
  Administered 2021-12-12 – 2021-12-14 (×6): 1 via ORAL
  Filled 2021-12-11 (×7): qty 1

## 2021-12-11 MED ORDER — METHYLPREDNISOLONE SODIUM SUCC 125 MG IJ SOLR: 80.0000 mg | Freq: Once | INTRAMUSCULAR | Status: DC

## 2021-12-11 MED ORDER — HYDROCODONE-ACETAMINOPHEN 5-325 MG PO TABS
1.0000 | ORAL_TABLET | ORAL | Status: DC | PRN
  Administered 2021-12-12 – 2021-12-13 (×2): 1 via ORAL
  Filled 2021-12-11 (×2): qty 1

## 2021-12-11 MED ORDER — ONDANSETRON HCL 4 MG PO TABS: 4.0000 mg | ORAL_TABLET | Freq: Four times a day (QID) | ORAL | Status: DC | PRN

## 2021-12-11 MED ORDER — IPRATROPIUM-ALBUTEROL 0.5-2.5 (3) MG/3ML IN SOLN
3.0000 mL | RESPIRATORY_TRACT | Status: AC
  Administered 2021-12-11 (×3): 3 mL via RESPIRATORY_TRACT
  Filled 2021-12-11 (×3): qty 3

## 2021-12-11 MED ORDER — METHYLPREDNISOLONE SODIUM SUCC 125 MG IJ SOLR
125.0000 mg | Freq: Two times a day (BID) | INTRAMUSCULAR | Status: AC
  Administered 2021-12-12 (×2): 125 mg via INTRAVENOUS
  Filled 2021-12-11 (×2): qty 2

## 2021-12-11 MED ORDER — OSELTAMIVIR PHOSPHATE 75 MG PO CAPS
75.0000 mg | ORAL_CAPSULE | Freq: Two times a day (BID) | ORAL | Status: DC
  Administered 2021-12-12 – 2021-12-14 (×6): 75 mg via ORAL
  Filled 2021-12-11 (×9): qty 1

## 2021-12-11 MED ORDER — ALBUTEROL SULFATE (2.5 MG/3ML) 0.083% IN NEBU
INHALATION_SOLUTION | RESPIRATORY_TRACT | Status: AC
  Filled 2021-12-11: qty 3

## 2021-12-11 MED ORDER — APIXABAN 5 MG PO TABS
5.0000 mg | ORAL_TABLET | Freq: Two times a day (BID) | ORAL | Status: DC
  Administered 2021-12-12 – 2021-12-14 (×6): 5 mg via ORAL
  Filled 2021-12-11 (×6): qty 1

## 2021-12-11 MED ORDER — POLYETHYLENE GLYCOL 3350 17 G PO PACK: 17.0000 g | PACK | Freq: Every day | ORAL | Status: DC | PRN

## 2021-12-11 MED ORDER — DICLOFENAC SODIUM 1 % EX GEL: 2.0000 g | Freq: Every day | CUTANEOUS | Status: DC | PRN

## 2021-12-11 MED ORDER — FUROSEMIDE 20 MG PO TABS
80.0000 mg | ORAL_TABLET | Freq: Every day | ORAL | Status: DC
  Administered 2021-12-12: 80 mg via ORAL
  Filled 2021-12-11: qty 4

## 2021-12-11 MED ORDER — GABAPENTIN 300 MG PO CAPS
300.0000 mg | ORAL_CAPSULE | Freq: Two times a day (BID) | ORAL | Status: DC
  Administered 2021-12-12 – 2021-12-14 (×6): 300 mg via ORAL
  Filled 2021-12-11 (×6): qty 1

## 2021-12-11 MED ORDER — ALBUTEROL SULFATE (2.5 MG/3ML) 0.083% IN NEBU
2.5000 mg | INHALATION_SOLUTION | Freq: Once | RESPIRATORY_TRACT | Status: AC
  Administered 2021-12-11: 2.5 mg via RESPIRATORY_TRACT

## 2021-12-11 MED ORDER — ACETAMINOPHEN 325 MG PO TABS: 650.0000 mg | ORAL_TABLET | Freq: Four times a day (QID) | ORAL | Status: DC | PRN

## 2021-12-11 MED ORDER — PREDNISONE 20 MG PO TABS
40.0000 mg | ORAL_TABLET | Freq: Every day | ORAL | Status: DC
  Administered 2021-12-13 – 2021-12-14 (×2): 40 mg via ORAL
  Filled 2021-12-11 (×3): qty 2

## 2021-12-11 MED ORDER — CARVEDILOL 25 MG PO TABS
25.0000 mg | ORAL_TABLET | Freq: Two times a day (BID) | ORAL | Status: DC
  Administered 2021-12-12 – 2021-12-14 (×4): 25 mg via ORAL
  Filled 2021-12-11 (×2): qty 1
  Filled 2021-12-11: qty 2
  Filled 2021-12-11: qty 1

## 2021-12-11 MED ORDER — AMLODIPINE BESYLATE 10 MG PO TABS
10.0000 mg | ORAL_TABLET | Freq: Every day | ORAL | Status: DC
  Administered 2021-12-12 – 2021-12-14 (×3): 10 mg via ORAL
  Filled 2021-12-11 (×2): qty 1
  Filled 2021-12-11: qty 2

## 2021-12-11 MED ORDER — ALBUTEROL SULFATE (2.5 MG/3ML) 0.083% IN NEBU: 2.5000 mg | INHALATION_SOLUTION | RESPIRATORY_TRACT | Status: DC | PRN

## 2021-12-11 MED ORDER — METHYLPREDNISOLONE SODIUM SUCC 125 MG IJ SOLR
INTRAMUSCULAR | Status: AC
  Filled 2021-12-11: qty 2

## 2021-12-11 MED ORDER — MELATONIN 5 MG PO TABS
5.0000 mg | ORAL_TABLET | Freq: Every day | ORAL | Status: DC
  Administered 2021-12-12 – 2021-12-13 (×3): 5 mg via ORAL
  Filled 2021-12-11 (×4): qty 1

## 2021-12-11 MED ORDER — INSULIN ASPART 100 UNIT/ML IJ SOLN: 0.0000 [IU] | Freq: Every day | INTRAMUSCULAR | Status: DC

## 2021-12-11 MED ORDER — MAGNESIUM SULFATE 2 GM/50ML IV SOLN
2.0000 g | Freq: Once | INTRAVENOUS | Status: AC
  Administered 2021-12-12: 2 g via INTRAVENOUS
  Filled 2021-12-11: qty 50

## 2021-12-11 MED ORDER — INSULIN ASPART 100 UNIT/ML IJ SOLN
0.0000 [IU] | Freq: Three times a day (TID) | INTRAMUSCULAR | Status: DC
  Administered 2021-12-12: 2 [IU] via SUBCUTANEOUS

## 2021-12-11 MED ORDER — FUROSEMIDE 10 MG/ML IJ SOLN
60.0000 mg | Freq: Once | INTRAMUSCULAR | Status: AC
  Administered 2021-12-12: 60 mg via INTRAVENOUS
  Filled 2021-12-11: qty 6

## 2021-12-11 MED ORDER — ONDANSETRON HCL 4 MG/2ML IJ SOLN: 4.0000 mg | Freq: Four times a day (QID) | INTRAMUSCULAR | Status: DC | PRN

## 2021-12-11 MED ORDER — PANTOPRAZOLE SODIUM 40 MG PO TBEC
40.0000 mg | DELAYED_RELEASE_TABLET | Freq: Two times a day (BID) | ORAL | Status: DC
  Administered 2021-12-12 – 2021-12-14 (×6): 40 mg via ORAL
  Filled 2021-12-11 (×6): qty 1

## 2021-12-11 MED ORDER — SODIUM CHLORIDE 0.9% FLUSH
3.0000 mL | Freq: Two times a day (BID) | INTRAVENOUS | Status: DC
  Administered 2021-12-12 – 2021-12-14 (×5): 3 mL via INTRAVENOUS

## 2021-12-11 MED ORDER — METHYLPREDNISOLONE SODIUM SUCC 125 MG IJ SOLR
80.0000 mg | Freq: Once | INTRAMUSCULAR | Status: AC
  Administered 2021-12-11: 80 mg via INTRAVENOUS

## 2021-12-11 MED ORDER — IPRATROPIUM-ALBUTEROL 0.5-2.5 (3) MG/3ML IN SOLN
3.0000 mL | Freq: Once | RESPIRATORY_TRACT | Status: AC
  Administered 2021-12-11: 3 mL via RESPIRATORY_TRACT

## 2021-12-11 MED ORDER — ACETAMINOPHEN 650 MG RE SUPP: 650.0000 mg | Freq: Four times a day (QID) | RECTAL | Status: DC | PRN

## 2021-12-11 MED ORDER — IPRATROPIUM-ALBUTEROL 0.5-2.5 (3) MG/3ML IN SOLN
RESPIRATORY_TRACT | Status: AC
  Filled 2021-12-11: qty 3

## 2021-12-11 NOTE — ED Notes (Signed)
Patient is being discharged from the Urgent Care and sent to the Emergency Department via carelink . Per Sharyn Lull NP, patient is in need of higher level of care due to dyspnea and sating in the high 80's on room air. Patient is aware and verbalizes understanding of plan of care.  Vitals:   12/11/21 1800 12/11/21 1844  BP:    Pulse:    Resp:    Temp:    SpO2: 92% 98%

## 2021-12-11 NOTE — ED Provider Notes (Signed)
[facility] EMERGENCY DEPARTMENT Provider Note   CSN: [id number] Arrival date & time: 12/11/21  1948     History  Chief Complaint  Patient presents with   Shortness of Breath    Alexandra Bell is a 56 y.o. female.   Shortness of Breath  The patient is a 56 year old female with past medical history of asthma, COPD, HFrEF, A-fib (on Eliquis), T2DM, ICD who is presenting by [facility] from urgent care for shortness of breath.  The patient states that she developed shortness of breath yesterday.  She has been using breathing treatments at home without improvement.  She endorsing generalized body aches and from review of the outside note the patient had an episode of epistaxis earlier today which resolved prior to her presentation at [facility].  She denies fever, nausea, vomiting, chest pain, lower extremity swelling.  The patient has no home O2 requirement but does endorse a recent sick contact and family members at home.    Home Medications Prior to Admission medications   Medication Sig Start Date End Date Taking? Authorizing Provider  acetaminophen (TYLENOL) 500 MG tablet Take 2 tablets (1,000 mg total) by mouth every 6 (six) hours as needed (For pain). 10/07/20  Yes Domenic Polite, MD  albuterol (PROVENTIL) (2.5 MG/3ML) 0.083% nebulizer solution Inhale 3 mL 3 times a day by nebulization route as needed for 30 days. 03/04/17  Yes [provider]  albuterol (VENTOLIN HFA) 108 (90 Base) MCG/ACT inhaler Inhale 2 puffs into the lungs every 6 (six) hours as needed. For asthma 09/07/20  Yes [name], FNP  amLODipine (NORVASC) 10 MG tablet Take 1 tablet by mouth once daily 11/01/21  Yes Skeet Latch, MD  apixaban Arne Cleveland) 5 MG TABS tablet Take 1 tablet by mouth twice daily 09/07/21  Yes Skeet Latch, MD  carvedilol (COREG) 25 MG tablet Take 1 tablet (25 mg total) by mouth 2 (two) times daily. 07/20/21  Yes Skeet Latch, MD  diclofenac Sodium (VOLTAREN) 1 %  GEL Apply 2 g topically daily as needed (For pain). 01/06/21  Yes [name], FNP  FARXIGA 10 MG TABS tablet TAKE 1 TABLET BY MOUTH ONCE DAILY BEFORE BREAKFAST 11/14/21  Yes [name], FNP  ferrous sulfate 325 (65 FE) MG EC tablet Take 1 tablet (325 mg total) by mouth 2 (two) times daily with a meal. 11/03/20 12/11/21 Yes [name], FNP  furosemide (LASIX) 80 MG tablet Take 1 tablet by mouth once daily 05/15/21  Yes Skeet Latch, MD  gabapentin (NEURONTIN) 300 MG capsule Take 1 capsule by mouth twice daily 11/16/21  Yes [name], FNP  Magnesium 250 MG TABS Take 250 mg by mouth daily at 2 PM.   Yes [provider]  melatonin 5 MG TABS Take 5 mg by mouth daily at 2 PM.   Yes [provider]  meloxicam (MOBIC) 15 MG tablet Take 15 mg by mouth daily as needed. 12/07/21  Yes [provider]  metFORMIN (GLUCOPHAGE) 500 MG tablet Take 1 tablet (500 mg total) by mouth 2 (two) times daily with a meal. 08/23/21  Yes [name], FNP  Multiple Vitamin (MULTIVITAMIN WITH MINERALS) TABS tablet Take 1 tablet by mouth daily.   Yes [provider]  pantoprazole (PROTONIX) 40 MG tablet Take 1 tablet (40 mg total) by mouth 2 (two) times daily. 08/10/21  Yes [name], FNP  potassium chloride SA (KLOR-CON M) 20 MEQ tablet Take 1 tablet (20 mEq total) by mouth daily. 05/02/21  Yes  [name], FNP  sacubitril-valsartan (ENTRESTO) 24-26 MG Take 1 tablet by mouth 2 (two) times daily. 10/05/21  Yes Skeet Latch, MD      Allergies    Patient has no known allergies.    Review of Systems   Review of Systems  Respiratory:  Positive for shortness of breath.    See HPI Physical Exam Updated Vital Signs BP 130/68   Pulse 82   Temp 97.6 F (36.4 C) (Oral)   Resp (!) 29   Ht '5\' 6"'$  (1.676 m)   Wt 113.9 kg   SpO2 100%   BMI 40.51 kg/m  Physical Exam Vitals and nursing note reviewed.  Constitutional:      General: She is not in acute distress.     Appearance: She is well-developed.  HENT:     Head: Normocephalic and atraumatic.  Eyes:     Conjunctiva/sclera: Conjunctivae normal.  Cardiovascular:     Rate and Rhythm: Tachycardia present. Rhythm irregular.     Heart sounds: No murmur heard. Pulmonary:     Effort: Pulmonary effort is normal. No respiratory distress.     Breath sounds: Normal breath sounds.  Abdominal:     Palpations: Abdomen is soft.     Tenderness: There is no abdominal tenderness.  Musculoskeletal:        General: No swelling.     Cervical back: Neck supple.     Right lower leg: Edema present.     Left lower leg: Edema present.  Skin:    General: Skin is warm and dry.     Capillary Refill: Capillary refill takes less than 2 seconds.  Neurological:     Mental Status: She is alert.  Psychiatric:        Mood and Affect: Mood normal.     ED Results / Procedures / Treatments   Labs (all labs ordered are listed, but only abnormal results are displayed) Labs Reviewed  RESP PANEL BY RT-PCR (FLU A&B, COVID) ARPGX2 - Abnormal; Notable for the following components:      Result Value   Influenza A by PCR POSITIVE (*)    All other components within normal limits  BRAIN NATRIURETIC PEPTIDE - Abnormal; Notable for the following components:   B Natriuretic Peptide 1,127.5 (*)    All other components within normal limits  COMPREHENSIVE METABOLIC PANEL - Abnormal; Notable for the following components:   Glucose, Bld 126 (*)    Albumin 3.4 (*)    All other components within normal limits  CBC WITH DIFFERENTIAL/PLATELET - Abnormal; Notable for the following components:   Monocytes Absolute 1.5 (*)    All other components within normal limits  I-STAT VENOUS BLOOD GAS, ED - Abnormal; Notable for the following components:   pH, Ven 7.454 (*)    pCO2, Ven 39.3 (*)    pO2, Ven 56 (*)    Acid-Base Excess 3.0 (*)    Hemoglobin 15.3 (*)    All other components within normal limits  URINALYSIS, ROUTINE W REFLEX  MICROSCOPIC    EKG EKG Interpretation  Date/Time:  Monday December 11 2021 19:58:06 EST Ventricular Rate:  118 PR Interval:    QRS Duration: 99 QT Interval:  351 QTC Calculation: 467 R Axis:   7 Text Interpretation: Atrial fibrillation LVH with secondary repolarization abnormality Abnormal ECG Confirmed by Carmin Muskrat [phone]) on 12/11/2021 8:38:55 PM  Radiology DG Chest Portable 1 View  Result Date: 12/11/2021 CLINICAL DATA:  Shortness of breath EXAM: PORTABLE CHEST 1 VIEW  COMPARISON:  Chest x-ray dated December 04, 2020 FINDINGS: Unchanged cardiomegaly. Left chest wall dual lead pacer with unchanged lead position. Bilateral interstitial opacities, similar to prior exam. No large pleural effusion or evidence of pneumothorax. IMPRESSION: Unchanged cardiomegaly. Bilateral interstitial opacities, likely due to pulmonary edema. Electronically Signed   By: Yetta Glassman M.D.   On: 12/11/2021 20:43    Procedures Procedures    Medications Ordered in ED Medications  furosemide (LASIX) injection 60 mg (has no administration in time range)  oseltamivir (TAMIFLU) capsule 75 mg (has no administration in time range)  ipratropium-albuterol (DUONEB) 0.5-2.5 (3) MG/3ML nebulizer solution 3 mL (3 mLs Nebulization Given 12/11/21 2206)    ED Course/ Medical Decision Making/ A&P                           Medical Decision Making   The patient is a 56 year old female with past medical history of asthma, COPD, HFrEF, A-fib (on Eliquis), T2DM, ICD who is presenting by [facility] from urgent care for shortness of breath.  The differential diagnosis considered includes: COPD exacerbation, CHF exacerbation, ACS, viral respiratory infection, pneumonia, sepsis, PE.  On initial evaluation, the patient was tachycardic to the 120s in atrial fibrillation.  She was endorsing shortness of breath and generalized bodyaches.  She was on 1 L oxygen with sats in the low 90s.  The patient's physical exam was  significant for lower extremity edema bilaterally which the patient said was chronic.  I independently reviewed and interpreted all parts of the patient's diagnostic workup which included an EKG which showed atrial fibrillation with RVR; chest x-ray which showed cardiomegaly with bilateral interstitial opacities favored to be pulmonary edema; CBC with white blood cell count of 10.0 and hemoglobin of 14.2; CMP with glucose of 126 but no other significant metabolic abnormality; BNP elevated to 1127; VBG with pH of 7.454, pCO2 of 39.3; and respiratory panel which was positive for influenza A.  Based on the patient's reported sick contact and a family member, reported generalized myalgias, positive influenza A test, and elevated BNP with signs of volume overload on chest x-ray and physical exam with signs of volume overload, a viral respiratory infection with CHF exacerbation is favored as a likely source of the patient's dyspnea and new O2 requirement.  While additional causes such as ACS were considered, the patient's absence of chest pain and nonischemic EKG make this less likely and her symptoms are better explained by alternative causes.  PE was also considered however, the patient reports compliance with her Eliquis and her hypoxia and dyspnea are better explained by her infection and volume status.  While in the emergency department, the patient received back-to-back DuoNebs with reported improvement in her respiratory effort.  She was noted to convert out of A-fib back to normal sinus rhythm with a rate in the 80s.  IV diuretics were ordered for management of the patient's volume overload.  The patient was admitted to the hospitalist service for further management.  Amount and/or Complexity of Data Reviewed External Data Reviewed: labs, radiology, ECG and notes. Labs: ordered. Decision-making details documented in ED Course. Radiology: ordered and independent interpretation performed.  Decision-making details documented in ED Course. ECG/medicine tests: ordered and independent interpretation performed. Decision-making details documented in ED Course.  Risk Prescription drug management.   Patient's presentation is most consistent with acute presentation with potential threat to life or bodily function.         Final Clinical  Impression(s) / ED Diagnoses Final diagnoses:  Shortness of breath  Acute on chronic congestive heart failure, unspecified heart failure type [facility])  Influenza A    Rx / DC Orders ED Discharge Orders     None         Dani Gobble, MD 12/11/21 2228    Carmin Muskrat, MD 12/12/21 2302

## 2021-12-11 NOTE — ED Notes (Signed)
Patient is being discharged from the Urgent Care and sent to the Emergency Department via [facility] . Per Joella Prince NP, patient is in need of higher level of care due to hypoxia on RA at 86%. Patient is aware and verbalizes understanding of plan of care. Called report to Janett Billow, charge nurse [place] ED Vitals:   12/11/21 1800 12/11/21 1844  BP:    Pulse:    Resp:    Temp:    SpO2: 92% 98%

## 2021-12-11 NOTE — ED Provider Notes (Signed)
[place]    CSN: [id number] Arrival date & time: 12/11/21  1423      History   Chief Complaint Chief Complaint  Patient presents with   Cough   Epistaxis   Generalized Body Aches    HPI NATHANIEL YADEN is a 56 y.o. female.   Patient presents urgent care for evaluation of cough, body aches, and shortness of breath/wheezing that started yesterday.  She reports nasal congestion and productive cough with yellow/clear phlegm.  No recent antibiotic or steroid use.  Patient has a history of asthma, COPD, atrial fibrillation, type 2 diabetes, and tobacco use disorder.  Takes Eliquis daily.  ICD in place, patient has not felt this go off recently.  Patient is a current every day cigarette smoker, denies all other drug use.  She has not been hospitalized related to respiratory problems recently.  Sister and another family member are sick with similar symptoms.  She is vaccinated against COVID-19 but has not received her influenza vaccine this year.  Denies nausea, vomiting, dizziness, chest pain, weakness, headache, vision changes, decreased oral intake, urinary symptoms, abdominal pain, and confusion.  Reports nosebleed earlier today that stopped after couple of hours with slow oozing.  Denies history of frequent nosebleeds.  Bleeding stopped prior to arrival at urgent care.   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   Fatty liver 11/2010   Gastritis    GERD (gastroesophageal reflux disease)    HOCM (hypertrophic obstructive cardiomyopathy) ([place]) 12/10/2019   Hx: UTI (urinary tract infection)    Hypertension    Inguinal hernia    Morbid obesity ([place]) 12/10/2019   NSVT (nonsustained ventricular  tachycardia) ([place]) 03/10/2020   Obesity, Class III, BMI 40-49.9 (morbid obesity) ([place])    Periumbilical hernia    Upper GI bleed 09/11/2020    Patient Active Problem List   Diagnosis Date Noted   Pain in both knees 03/20/2021   Acute blood loss anemia    Gastritis and gastroduodenitis    Symptomatic anemia 10/04/2020   Duodenal ulcer    Chronic anticoagulation    AF (paroxysmal atrial fibrillation) ([place]) 09/11/2020   Tobacco use disorder 09/11/2020   Type 2 diabetes mellitus ([place]) 09/11/2020   Diabetic neuropathy ([place]) 09/11/2020   NSVT (nonsustained ventricular tachycardia) ([place]) 03/10/2020   HOCM (hypertrophic obstructive cardiomyopathy) ([place]) 12/10/2019   Eosinophilia 01/16/2011   Colitis, eosinophilic 93/71/6967   Leukocytosis 11/28/2010   Essential hypertension 05/18/2009   Asthma 05/18/2009   Nonspecific (abnormal) findings on radiological and other examination of body structure 05/18/2009   COMPUTERIZED TOMOGRAPHY, CHEST, ABNORMAL 05/18/2009    Past Surgical History:  Procedure Laterality Date   ABDOMINAL HYSTERECTOMY     BIOPSY  09/13/2020   Procedure: BIOPSY;  Surgeon: Jackquline Denmark, MD;  Location[facility] ENDOSCOPY;  Service: Endoscopy;;   BIOPSY  10/05/2020   Procedure: BIOPSY;  Surgeon: Jerene Bears, MD;  Location: [facility] ENDOSCOPY;  Service: Gastroenterology;;   BRONCHOSCOPY, LYMPH NODE BX  MAY 2011   CHOLECYSTECTOMY  2001   COLONOSCOPY N/A 09/13/2020   Procedure: COLONOSCOPY;  Surgeon: Jackquline Denmark, MD;  Location: [facility] ENDOSCOPY;  Service: Endoscopy;  Laterality: N/A;   ERCP/SPHINCTEROTOMY/STONE EXTRACTION  2001   ESOPHAGOGASTRODUODENOSCOPY (EGD) WITH PROPOFOL N/A 09/13/2020   Procedure: ESOPHAGOGASTRODUODENOSCOPY (EGD) WITH PROPOFOL;  Surgeon: Jackquline Denmark, MD;  Location: [facility] ENDOSCOPY;  Service: Endoscopy;  Laterality: N/A;   ESOPHAGOGASTRODUODENOSCOPY (EGD) WITH PROPOFOL N/A 10/05/2020   Procedure: ESOPHAGOGASTRODUODENOSCOPY (EGD) WITH PROPOFOL;  Surgeon: Jerene Bears,  MD;  Location: [facility] ENDOSCOPY;  Service: Gastroenterology;  Laterality: N/A;   FLEXIBLE SIGMOIDOSCOPY  11/29/2010   Procedure: FLEXIBLE SIGMOIDOSCOPY;  Surgeon: Lafayette Dragon, MD;  Location[facility] ENDOSCOPY;  Service: Endoscopy;  Laterality: N/A;   HEMOSTASIS CLIP PLACEMENT  10/05/2020   Procedure: HEMOSTASIS CLIP PLACEMENT;  Surgeon: Jerene Bears, MD;  Location: [facility] ENDOSCOPY;  Service: Gastroenterology;;   ICD IMPLANT N/A 03/25/2020   Procedure: ICD IMPLANT;  Surgeon: Vickie Epley, MD;  Location: [place] CV LAB;  Service: Cardiovascular;  Laterality: N/A;    OB History   No obstetric history on file.      Home Medications    Prior to Admission medications   Medication Sig Start Date End Date Taking? Authorizing Provider  acetaminophen (TYLENOL) 500 MG tablet Take 2 tablets (1,000 mg total) by mouth every 6 (six) hours as needed (For pain). 10/07/20   Domenic Polite, MD  albuterol (VENTOLIN HFA) 108 (90 Base) MCG/ACT inhaler Inhale 2 puffs into the lungs every 6 (six) hours as needed. For asthma 09/07/20   [name], FNP  amLODipine (NORVASC) 10 MG tablet Take 1 tablet by mouth once daily 11/01/21   Skeet Latch, MD  apixaban Arne Cleveland) 5 MG TABS tablet Take 1 tablet by mouth twice daily 09/07/21   Skeet Latch, MD  carvedilol (COREG) 25 MG tablet Take 1 tablet (25 mg total) by mouth 2 (two) times daily. 07/20/21   Skeet Latch, MD  diclofenac Sodium (VOLTAREN) 1 % GEL Apply 2 g topically daily as needed (For pain). 01/06/21   [name], FNP  FARXIGA 10 MG TABS tablet TAKE 1 TABLET BY MOUTH ONCE DAILY BEFORE BREAKFAST 11/14/21   [name], FNP  ferrous sulfate 325 (65 FE) MG EC tablet Take 1 tablet (325 mg total) by mouth 2 (two) times daily with a meal. 11/03/20 11/03/21  [name], FNP  furosemide (LASIX) 80 MG tablet Take 1 tablet by mouth once daily 05/15/21   Skeet Latch, MD  gabapentin (NEURONTIN) 300 MG capsule Take 1 capsule by mouth twice daily  11/16/21   [name], FNP  Magnesium 250 MG TABS Take 250 mg by mouth daily at 2 PM.    [provider]  melatonin 5 MG TABS Take 5 mg by mouth daily at 2 PM.    [provider]  metFORMIN (GLUCOPHAGE) 500 MG tablet Take 1 tablet (500 mg total) by mouth 2 (two) times daily with a meal. 08/23/21   [name], FNP  Multiple Vitamin (MULTIVITAMIN WITH MINERALS) TABS tablet Take 1 tablet by mouth daily.    [provider]  pantoprazole (PROTONIX) 40 MG tablet Take 1 tablet (40 mg total) by mouth 2 (two) times daily. 08/10/21   [name], FNP  potassium chloride SA (KLOR-CON M) 20 MEQ tablet Take 1 tablet (20 mEq total) by mouth daily. 05/02/21   [name], FNP  sacubitril-valsartan (ENTRESTO) 24-26 MG Take 1 tablet by mouth 2 (two) times daily. 10/05/21   Skeet Latch, MD  traMADol (ULTRAM) 50 MG tablet TAKE 1 TABLET BY MOUTH EVERY 12 HOURS AS NEEDED FOR  SEVERE  PAIN  OR  MODERATE  PAIN 03/20/21   Laurance Flatten,  Doreene Burke, FNP    Family History Family History  Problem Relation Age of Onset   Diabetes Mother    Colon cancer Father        ?   Heart attack Father    Diabetes Sister     Social History Social History   Tobacco Use   Smoking status: Every Day    Packs/day: 0.25    Years: 30.00    Total pack years: 7.50    Types: Cigarettes   Smokeless tobacco: Never   Tobacco comments:    she is less than a PPD - states down to 6 cigarettes a day - 10/14, reports smoking 1 PPD for 10 years; she has cut down to 3 a day  Vaping Use   Vaping Use: Never used  Substance Use Topics   Alcohol use: No   Drug use: No     Allergies   Patient has no known allergies.   Review of Systems Review of Systems  HENT:  Positive for nosebleeds.   Respiratory:  Positive for cough.   Per HPI   Physical Exam Triage Vital Signs ED Triage Vitals  Enc Vitals Group     BP 12/11/21 1757 137/83     Pulse Rate 12/11/21 1757 90     Resp 12/11/21 1757 20     Temp  12/11/21 1757 98.9 F (37.2 C)     Temp Source 12/11/21 1757 Oral     SpO2 --      Weight --      Height --      Head Circumference --      Peak Flow --      Pain Score 12/11/21 1756 8     Pain Loc --      Pain Edu? --      Excl. in [place]? --    No data found.  Updated Vital Signs BP 137/83 (BP Location: Left Arm)   Pulse 90   Temp 98.9 F (37.2 C) (Oral)   Resp 20   SpO2 98%   Visual Acuity Right Eye Distance:   Left Eye Distance:   Bilateral Distance:    Right Eye Near:   Left Eye Near:    Bilateral Near:     Physical Exam Vitals and nursing note reviewed.  Constitutional:      Appearance: She is obese. She is ill-appearing. She is not toxic-appearing.  HENT:     Head: Normocephalic and atraumatic.     Right Ear: Hearing, tympanic membrane, ear canal and external ear normal.     Left Ear: Hearing, tympanic membrane, ear canal and external ear normal.     Nose: Congestion present.     Right Turbinates: Swollen.     Left Turbinates: Swollen.     Mouth/Throat:     Lips: Pink.     Mouth: Mucous membranes are moist.     Pharynx: No posterior oropharyngeal erythema.  Eyes:     General: Lids are normal. Vision grossly intact. Gaze aligned appropriately.        Right eye: No discharge.        Left eye: No discharge.     Extraocular Movements: Extraocular movements intact.     Conjunctiva/sclera: Conjunctivae normal.  Cardiovascular:     Rate and Rhythm: Normal rate and regular rhythm.     Heart sounds: Normal heart sounds, S1 normal and S2 normal.  Pulmonary:     Effort: Prolonged expiration present. No respiratory distress.  Breath sounds: Normal air entry. Decreased breath sounds and wheezing present. No rhonchi or rales.     Comments: Decreased breath sounds to auscultation initially with diffuse faint expiratory wheeze. Abdominal:     Tenderness: There is no right CVA tenderness or left CVA tenderness.  Musculoskeletal:     Cervical back: Neck supple.      Right lower leg: No edema.     Left lower leg: No edema.  Lymphadenopathy:     Cervical: No cervical adenopathy.  Skin:    General: Skin is warm and dry.     Capillary Refill: Capillary refill takes less than 2 seconds.     Findings: No rash.  Neurological:     General: No focal deficit present.     Mental Status: She is alert and oriented to person, place, and time. Mental status is at baseline.     Cranial Nerves: No dysarthria or facial asymmetry.  Psychiatric:        Mood and Affect: Mood normal.        Speech: Speech normal.        Behavior: Behavior normal.        Thought Content: Thought content normal.        Judgment: Judgment normal.      UC Treatments / Results  Labs (all labs ordered are listed, but only abnormal results are displayed) Labs Reviewed - No data to display  EKG   Radiology No results found.  Procedures Procedures (including critical care time)  Medications Ordered in UC Medications  ipratropium-albuterol (DUONEB) 0.5-2.5 (3) MG/3ML nebulizer solution 3 mL (3 mLs Nebulization Given 12/11/21 1826)  albuterol (PROVENTIL) (2.5 MG/3ML) 0.083% nebulizer solution 2.5 mg (2.5 mg Nebulization Given 12/11/21 1904)    Initial Impression / Assessment and Plan / UC Course  I have reviewed the triage vital signs and the nursing notes.  Pertinent labs & imaging results that were available during my care of the patient were reviewed by me and considered in my medical decision making (see chart for details).   1.  COPD exacerbation with hypoxia O2 saturation 88% upon arrival on room air, patient tachypneic and short of breath. Able to speak in full sentences with some difficulty initially.  Patient placed on 2 L of oxygen and oxygen saturation improved to 96% on 2 L.  DuoNeb given in clinic and subjective shortness of breath improved significantly as well as objective lung sounds.  Lung sounds remain diminished but moving more air.  Room air trial for 10 minutes  completed.  Initially, patient's oxygen on room air after DuoNeb 93%, then dropped back down to 84% on room air.  Shortness of breath returned and patient given another albuterol breathing treatment.  Patient would benefit from a visit to the emergency department for further workup and evaluation due to hypoxia and need for supplemental oxygen.  Discussed these recommendations with patient who expresses understanding and agreement with plan.  Unable to perform imaging in urgent care as our x-ray machine is down.  Patient given 80 mg Solu-Medrol IV prior to [facility] transport to [facility] emergency department for further evaluation. Discussed risks of deferring ED visit, she is agreeable with this and expresses understanding. '    Final Clinical Impressions(s) / UC Diagnoses   Final diagnoses:  Acute respiratory failure with hypoxia ([place])  Viral URI with cough  COPD exacerbation [facility])   Discharge Instructions   None    ED Prescriptions   None  PDMP not reviewed this encounter.   Talbot Grumbling,  12/11/21 1931

## 2021-12-11 NOTE — H&P (Signed)
History and Physical    Alexandra Bell JHE:[id number] DOB: 07-Oct-1965 DOA: 12/11/2021  PCP: [name], FNP   Patient coming from: Home   Chief Complaint: SOB, cough, aches   HPI: Alexandra Bell is a 56 y.o. female with medical history significant for HOCM with ICD, atrial fibrillation on Eliquis, hypertension, hyperlipidemia, and asthma who presents to the emergency department with cough, shortness of breath, and aches.  Patient reports that she was exposed to family members with upper respiratory illness and then she herself developed cough, general aches, rhinorrhea, and loose stool on 12/09/2021.  She has gone on to develop worsening shortness of breath and wheezing associated with this.  She was initially evaluated at an urgent care where she was found to be saturating in the mid 80s on room air.  ED Course: Upon arrival to the ED, patient is found to be afebrile and saturating mid 80s on room air with heart rate 120s and stable blood pressure.  EKG demonstrates atrial fibrillation with rate 118 and LVH with repolarization abnormality.  Chest x-ray findings of cardiomegaly and bilateral interstitial opacities do not appear significantly changed from priors.  Influenza A PCR is positive and blood work notable for BNP of 1128.  Patient was treated with DuoNeb and 60 mg IV Lasix in the ED.  Review of Systems:  All other systems reviewed and apart from HPI, are negative.  Past Medical History:  Diagnosis Date   Acute renal failure ([place]) 11/27/2010   Adrenal adenoma    Arthritis    Asthma    Colitis, eosinophilic 18/56/3149   Common bile duct stone    COPD (chronic obstructive pulmonary disease) (HCC)    Duodenal ulcer    Eosinophilia 70/26/3785   Eosinophilic enteritis 88/5027   Fatty liver 11/2010   Gastritis    GERD (gastroesophageal reflux disease)    HOCM (hypertrophic obstructive cardiomyopathy) ([place]) 12/10/2019   Hx: UTI (urinary tract infection)    Hypertension     Inguinal hernia    Morbid obesity ([place]) 12/10/2019   NSVT (nonsustained ventricular tachycardia) ([place]) 03/10/2020   Obesity, Class III, BMI 40-49.9 (morbid obesity) ([place])    Periumbilical hernia    Upper GI bleed 09/11/2020    Past Surgical History:  Procedure Laterality Date   ABDOMINAL HYSTERECTOMY     BIOPSY  09/13/2020   Procedure: BIOPSY;  Surgeon: Jackquline Denmark, MD;  Location: [facility] ENDOSCOPY;  Service: Endoscopy;;   BIOPSY  10/05/2020   Procedure: BIOPSY;  Surgeon: Jerene Bears, MD;  Location: [facility] ENDOSCOPY;  Service: Gastroenterology;;   BRONCHOSCOPY, LYMPH NODE BX  MAY 2011   CHOLECYSTECTOMY  2001   COLONOSCOPY N/A 09/13/2020   Procedure: COLONOSCOPY;  Surgeon: Jackquline Denmark, MD;  Location: [facility] ENDOSCOPY;  Service: Endoscopy;  Laterality: N/A;   ERCP/SPHINCTEROTOMY/STONE EXTRACTION  2001   ESOPHAGOGASTRODUODENOSCOPY (EGD) WITH PROPOFOL N/A 09/13/2020   Procedure: ESOPHAGOGASTRODUODENOSCOPY (EGD) WITH PROPOFOL;  Surgeon: Jackquline Denmark, MD;  Location: [facility] ENDOSCOPY;  Service: Endoscopy;  Laterality: N/A;   ESOPHAGOGASTRODUODENOSCOPY (EGD) WITH PROPOFOL N/A 10/05/2020   Procedure: ESOPHAGOGASTRODUODENOSCOPY (EGD) WITH PROPOFOL;  Surgeon: Jerene Bears, MD;  Location: [facility] ENDOSCOPY;  Service: Gastroenterology;  Laterality: N/A;   FLEXIBLE SIGMOIDOSCOPY  11/29/2010   Procedure: FLEXIBLE SIGMOIDOSCOPY;  Surgeon: Lafayette Dragon, MD;  Location: [facility] ENDOSCOPY;  Service: Endoscopy;  Laterality: N/A;   HEMOSTASIS CLIP PLACEMENT  10/05/2020   Procedure: HEMOSTASIS CLIP PLACEMENT;  Surgeon: Jerene Bears, MD;  Location: [facility] ENDOSCOPY;  Service: Gastroenterology;;  ICD IMPLANT N/A 03/25/2020   Procedure: ICD IMPLANT;  Surgeon: Vickie Epley, MD;  Location: [place] CV LAB;  Service: Cardiovascular;  Laterality: N/A;    Social History:   reports that she has been smoking cigarettes. She has a 7.50 pack-year smoking history. She has never used smokeless tobacco. She reports that she does not drink  alcohol and does not use drugs.  No Known Allergies  Family History  Problem Relation Age of Onset   Diabetes Mother    Colon cancer Father        ?   Heart attack Father    Diabetes Sister      Prior to Admission medications   Medication Sig Start Date End Date Taking? Authorizing Provider  acetaminophen (TYLENOL) 500 MG tablet Take 2 tablets (1,000 mg total) by mouth every 6 (six) hours as needed (For pain). 10/07/20  Yes Domenic Polite, MD  albuterol (PROVENTIL) (2.5 MG/3ML) 0.083% nebulizer solution Inhale 3 mL 3 times a day by nebulization route as needed for 30 days. 03/04/17  Yes [provider]  albuterol (VENTOLIN HFA) 108 (90 Base) MCG/ACT inhaler Inhale 2 puffs into the lungs every 6 (six) hours as needed. For asthma 09/07/20  Yes [name], FNP  amLODipine (NORVASC) 10 MG tablet Take 1 tablet by mouth once daily 11/01/21  Yes Skeet Latch, MD  apixaban Arne [place]) 5 MG TABS tablet Take 1 tablet by mouth twice daily 09/07/21  Yes Skeet Latch, MD  carvedilol (COREG) 25 MG tablet Take 1 tablet (25 mg total) by mouth 2 (two) times daily. 07/20/21  Yes Skeet Latch, MD  diclofenac Sodium (VOLTAREN) 1 % GEL Apply 2 g topically daily as needed (For pain). 01/06/21  Yes [name], FNP  FARXIGA 10 MG TABS tablet TAKE 1 TABLET BY MOUTH ONCE DAILY BEFORE BREAKFAST 11/14/21  Yes [name], FNP  ferrous sulfate 325 (65 FE) MG EC tablet Take 1 tablet (325 mg total) by mouth 2 (two) times daily with a meal. 11/03/20 12/11/21 Yes [name], FNP  furosemide (LASIX) 80 MG tablet Take 1 tablet by mouth once daily 05/15/21  Yes Skeet Latch, MD  gabapentin (NEURONTIN) 300 MG capsule Take 1 capsule by mouth twice daily 11/16/21  Yes [name], FNP  Magnesium 250 MG TABS Take 250 mg by mouth daily at 2 PM.   Yes [provider]  melatonin 5 MG TABS Take 5 mg by mouth daily at 2 PM.   Yes [provider]  meloxicam (MOBIC) 15 MG tablet Take  15 mg by mouth daily as needed. 12/07/21  Yes [provider]  metFORMIN (GLUCOPHAGE) 500 MG tablet Take 1 tablet (500 mg total) by mouth 2 (two) times daily with a meal. 08/23/21  Yes [name], FNP  Multiple Vitamin (MULTIVITAMIN WITH MINERALS) TABS tablet Take 1 tablet by mouth daily.   Yes [provider]  pantoprazole (PROTONIX) 40 MG tablet Take 1 tablet (40 mg total) by mouth 2 (two) times daily. 08/10/21  Yes [name], FNP  potassium chloride SA (KLOR-CON M) 20 MEQ tablet Take 1 tablet (20 mEq total) by mouth daily. 05/02/21  Yes [name], FNP  sacubitril-valsartan (ENTRESTO) 24-26 MG Take 1 tablet by mouth 2 (two) times daily. 10/05/21  Yes Skeet Latch, MD    Physical Exam: Vitals:   12/11/21 2033 12/11/21 2035 12/11/21 2045 12/11/21 2205  BP:   136/65 130/68  Pulse:   81 82  Resp:   (!) 24 Marland Kitchen)  29  Temp:      TempSrc:      SpO2:  96% 98% 100%  Weight: 113.9 kg     Height: '5\' 6"'$  (1.676 m)       Constitutional: NAD, no pallor or diaphoresis  Eyes: PERTLA, lids and conjunctivae normal ENMT: Mucous membranes are moist. Posterior pharynx clear of any exudate or lesions.   Neck: supple, no masses  Respiratory: Diminished breath sounds bilaterally with prolonged expiratory phase and wheezes. Dyspnea with speech.  Cardiovascular: S1 & S2 heard, regular rate and rhythm. No JVD. Abdomen: No distension, no tenderness, soft. Bowel sounds active.  Musculoskeletal: no clubbing / cyanosis. No joint deformity upper and lower extremities.   Skin: no significant rashes, lesions, ulcers. Warm, dry, well-perfused. Neurologic: CN 2-12 grossly intact. Moving all extremities. Wakes to voice, oriented.  Psychiatric: Calm. Cooperative.    Labs and Imaging on Admission: I have personally reviewed following labs and imaging studies  CBC: Recent Labs  Lab 12/11/21 2000 12/11/21 2011  WBC 10.0  --   NEUTROABS 7.2  --   HGB 14.2 15.3*  HCT 43.0 45.0  MCV 94.3   --   PLT 225  --    Basic Metabolic Panel: Recent Labs  Lab 12/11/21 2000 12/11/21 2011  NA 138 137  K 4.2 4.2  CL 106  --   CO2 24  --   GLUCOSE 126*  --   BUN 12  --   CREATININE 0.67  --   CALCIUM 9.8  --    GFR: Estimated Creatinine Clearance: 99.3 mL/min (by C-G formula based on SCr of 0.67 mg/dL). Liver Function Tests: Recent Labs  Lab 12/11/21 2000  AST 21  ALT 14  ALKPHOS 63  BILITOT 0.6  PROT 6.5  ALBUMIN 3.4*   No results for input(s): "LIPASE", "AMYLASE" in the last 168 hours. No results for input(s): "AMMONIA" in the last 168 hours. Coagulation Profile: No results for input(s): "INR", "PROTIME" in the last 168 hours. Cardiac Enzymes: No results for input(s): "CKTOTAL", "CKMB", "CKMBINDEX", "TROPONINI" in the last 168 hours. BNP (last 3 results) No results for input(s): "PROBNP" in the last 8760 hours. HbA1C: No results for input(s): "HGBA1C" in the last 72 hours. CBG: No results for input(s): "GLUCAP" in the last 168 hours. Lipid Profile: No results for input(s): "CHOL", "HDL", "LDLCALC", "TRIG", "CHOLHDL", "LDLDIRECT" in the last 72 hours. Thyroid Function Tests: No results for input(s): "TSH", "T4TOTAL", "FREET4", "T3FREE", "THYROIDAB" in the last 72 hours. Anemia Panel: No results for input(s): "VITAMINB12", "FOLATE", "FERRITIN", "TIBC", "IRON", "RETICCTPCT" in the last 72 hours. Urine analysis:    Component Value Date/Time   COLORURINE YELLOW 11/28/2010 1033   APPEARANCEUR HAZY (A) 11/28/2010 1033   LABSPEC 1.021 11/28/2010 1033   PHURINE 5.5 11/28/2010 1033   GLUCOSEU NEGATIVE 11/28/2010 1033   HGBUR MODERATE (A) 11/28/2010 1033   BILIRUBINUR negative 08/10/2021 1528   KETONESUR NEGATIVE 11/28/2010 1033   PROTEINUR Negative 08/10/2021 1528   PROTEINUR 30 (A) 11/28/2010 1033   UROBILINOGEN 0.2 08/10/2021 1528   UROBILINOGEN 0.2 11/28/2010 1033   NITRITE negative 08/10/2021 1528   NITRITE NEGATIVE 11/28/2010 1033   LEUKOCYTESUR Negative  08/10/2021 1528   Sepsis Labs: '@LABRCNTIP'$ (procalcitonin:4,lacticidven:4) ) Recent Results (from the past 240 hour(s))  Resp Panel by RT-PCR (Flu A&B, Covid) Anterior Nasal Swab     Status: Abnormal   Collection Time: 12/11/21  8:16 PM   Specimen: Anterior Nasal Swab  Result Value Ref Range Status   SARS  Coronavirus 2 by RT PCR NEGATIVE NEGATIVE Final    Comment: (NOTE) SARS-CoV-2 target nucleic acids are NOT DETECTED.  The SARS-CoV-2 RNA is generally detectable in upper respiratory specimens during the acute phase of infection. The lowest concentration of SARS-CoV-2 viral copies this assay can detect is 138 copies/mL. A negative result does not preclude SARS-Cov-2 infection and should not be used as the sole basis for treatment or other patient management decisions. A negative result may occur with  improper specimen collection/handling, submission of specimen other than nasopharyngeal swab, presence of viral mutation(s) within the areas targeted by this assay, and inadequate number of viral copies(<138 copies/mL). A negative result must be combined with clinical observations, patient history, and epidemiological information. The expected result is Negative.  Fact Sheet for Patients:  EntrepreneurPulse.com.au  Fact Sheet for Healthcare Providers:  IncredibleEmployment.be  This test is no t yet approved or cleared by the [place] FDA and  has been authorized for detection and/or diagnosis of SARS-CoV-2 by FDA under an Emergency Use Authorization (EUA). This EUA will remain  in effect (meaning this test can be used) for the duration of the COVID-19 declaration under Section 564(b)(1) of the Act, 21 U.S.C.section 360bbb-3(b)(1), unless the authorization is terminated  or revoked sooner.       Influenza A by PCR POSITIVE (A) NEGATIVE Final   Influenza B by PCR NEGATIVE NEGATIVE Final    Comment: (NOTE) The Xpert Xpress  SARS-CoV-2/FLU/RSV plus assay is intended as an aid in the diagnosis of influenza from Nasopharyngeal swab specimens and should not be used as a sole basis for treatment. Nasal washings and aspirates are unacceptable for Xpert Xpress SARS-CoV-2/FLU/RSV testing.  Fact Sheet for Patients: EntrepreneurPulse.com.au  Fact Sheet for Healthcare Providers: IncredibleEmployment.be  This test is not yet approved or cleared by the [place] FDA and has been authorized for detection and/or diagnosis of SARS-CoV-2 by FDA under an Emergency Use Authorization (EUA). This EUA will remain in effect (meaning this test can be used) for the duration of the COVID-19 declaration under Section 564(b)(1) of the Act, 21 U.S.C. section 360bbb-3(b)(1), unless the authorization is terminated or revoked.  Performed at [facility], [place] [address] [place], [place] [address]      Radiological Exams on Admission: DG Chest Portable 1 View  Result Date: 12/11/2021 CLINICAL DATA:  Shortness of breath EXAM: PORTABLE CHEST 1 VIEW COMPARISON:  Chest x-ray dated December 04, 2020 FINDINGS: Unchanged cardiomegaly. Left chest wall dual lead pacer with unchanged lead position. Bilateral interstitial opacities, similar to prior exam. No large pleural effusion or evidence of pneumothorax. IMPRESSION: Unchanged cardiomegaly. Bilateral interstitial opacities, likely due to pulmonary edema. Electronically Signed   By: Yetta Glassman M.D.   On: 12/11/2021 20:43    EKG: Independently reviewed. Atrial fibrillation, rate 118, LVH with repolarization abnormality.   Assessment/Plan   1. Influenza A; asthma exacerbation; acute hypoxic respiratory failure  - She presents with cough, SOB, wheezing, and hypoxia d/t asthma exacerbation precipitated by influenza A infection  - Start Tamiflu, droplet precautions, and systemic steroids, give 2 g IV magnesium, and continue albuterol as needed     2. HOCM; chronic HFpEF  - Appears compensated on admission  - Continue Lasix, Coreg, and Entresto, monitor daily wt  3. HTN  - Continue Norvasc and Coreg   4. Type II DM  - A1c was only 5.4% in August 2023  - Check CBGs and use low-intensity SSI if needed   5. Atrial fibrillation  -  She was in RVR initially but respiratory status improved with breathing treatment and HR slowed  - Continue Eliquis and Coreg     DVT prophylaxis: Eliquis  Code Status: Full  Level of Care: Level of care: Telemetry Cardiac Family Communication: none present  Disposition Plan:  Patient is from: home  Anticipated d/c is to: Home  Anticipated d/c date is: Possibly as early as 12/5 or 12/13/21  Patient currently: Pending improved respiratory status  Consults called: none  Admission status: Observation     Vianne Bulls, MD [facility]  12/11/2021, 10:56 PM

## 2021-12-11 NOTE — ED Notes (Signed)
[facility] called for transport. 

## 2021-12-11 NOTE — ED Triage Notes (Signed)
Pt BIB [facility] from [facility] d/t c/o SHOB. Pt's initial O2 85% on RA. Pt placed on 2L and didn't show initial improve. Pt now 94% on 2L, states she is still feeling SHOB.  HX- COPD, CHF, has defibrillator

## 2021-12-11 NOTE — ED Triage Notes (Signed)
Pt reports a cough and body aches. Symptoms started yesterday.  Also reports a nosebleed today lasting about 2 hrs.

## 2021-12-12 DIAGNOSIS — J9601 Acute respiratory failure with hypoxia: Secondary | ICD-10-CM | POA: Diagnosis present

## 2021-12-12 DIAGNOSIS — Z7901 Long term (current) use of anticoagulants: Secondary | ICD-10-CM | POA: Diagnosis not present

## 2021-12-12 DIAGNOSIS — E119 Type 2 diabetes mellitus without complications: Secondary | ICD-10-CM | POA: Diagnosis present

## 2021-12-12 DIAGNOSIS — Z950 Presence of cardiac pacemaker: Secondary | ICD-10-CM | POA: Diagnosis not present

## 2021-12-12 DIAGNOSIS — F17210 Nicotine dependence, cigarettes, uncomplicated: Secondary | ICD-10-CM | POA: Diagnosis present

## 2021-12-12 DIAGNOSIS — Z6841 Body Mass Index (BMI) 40.0 and over, adult: Secondary | ICD-10-CM | POA: Diagnosis not present

## 2021-12-12 DIAGNOSIS — I480 Paroxysmal atrial fibrillation: Secondary | ICD-10-CM | POA: Diagnosis present

## 2021-12-12 DIAGNOSIS — I5042 Chronic combined systolic (congestive) and diastolic (congestive) heart failure: Secondary | ICD-10-CM | POA: Diagnosis present

## 2021-12-12 DIAGNOSIS — J45901 Unspecified asthma with (acute) exacerbation: Secondary | ICD-10-CM | POA: Diagnosis present

## 2021-12-12 DIAGNOSIS — I10 Essential (primary) hypertension: Secondary | ICD-10-CM

## 2021-12-12 DIAGNOSIS — Z79899 Other long term (current) drug therapy: Secondary | ICD-10-CM | POA: Diagnosis not present

## 2021-12-12 DIAGNOSIS — R0602 Shortness of breath: Secondary | ICD-10-CM | POA: Diagnosis present

## 2021-12-12 DIAGNOSIS — K760 Fatty (change of) liver, not elsewhere classified: Secondary | ICD-10-CM | POA: Diagnosis present

## 2021-12-12 DIAGNOSIS — E785 Hyperlipidemia, unspecified: Secondary | ICD-10-CM | POA: Diagnosis present

## 2021-12-12 DIAGNOSIS — Z7984 Long term (current) use of oral hypoglycemic drugs: Secondary | ICD-10-CM | POA: Diagnosis not present

## 2021-12-12 DIAGNOSIS — Z8249 Family history of ischemic heart disease and other diseases of the circulatory system: Secondary | ICD-10-CM | POA: Diagnosis not present

## 2021-12-12 DIAGNOSIS — I421 Obstructive hypertrophic cardiomyopathy: Secondary | ICD-10-CM | POA: Diagnosis present

## 2021-12-12 DIAGNOSIS — J4521 Mild intermittent asthma with (acute) exacerbation: Secondary | ICD-10-CM | POA: Diagnosis not present

## 2021-12-12 DIAGNOSIS — Z833 Family history of diabetes mellitus: Secondary | ICD-10-CM | POA: Diagnosis not present

## 2021-12-12 DIAGNOSIS — J441 Chronic obstructive pulmonary disease with (acute) exacerbation: Secondary | ICD-10-CM | POA: Diagnosis present

## 2021-12-12 DIAGNOSIS — Z1152 Encounter for screening for COVID-19: Secondary | ICD-10-CM | POA: Diagnosis not present

## 2021-12-12 DIAGNOSIS — J101 Influenza due to other identified influenza virus with other respiratory manifestations: Secondary | ICD-10-CM | POA: Diagnosis present

## 2021-12-12 DIAGNOSIS — F17200 Nicotine dependence, unspecified, uncomplicated: Secondary | ICD-10-CM

## 2021-12-12 DIAGNOSIS — I110 Hypertensive heart disease with heart failure: Secondary | ICD-10-CM | POA: Diagnosis present

## 2021-12-12 DIAGNOSIS — K219 Gastro-esophageal reflux disease without esophagitis: Secondary | ICD-10-CM | POA: Diagnosis present

## 2021-12-12 LAB — CBG MONITORING, ED
Glucose-Capillary: 157 mg/dL — ABNORMAL HIGH (ref 70–99)
Glucose-Capillary: 221 mg/dL — ABNORMAL HIGH (ref 70–99)

## 2021-12-12 LAB — CBC
HCT: 43.2 % (ref 36.0–46.0)
Hemoglobin: 13.9 g/dL (ref 12.0–15.0)
MCH: 31 pg (ref 26.0–34.0)
MCHC: 32.2 g/dL (ref 30.0–36.0)
MCV: 96.4 fL (ref 80.0–100.0)
Platelets: 222 10*3/uL (ref 150–400)
RBC: 4.48 MIL/uL (ref 3.87–5.11)
RDW: 13.4 % (ref 11.5–15.5)
WBC: 7.2 10*3/uL (ref 4.0–10.5)
nRBC: 0 % (ref 0.0–0.2)

## 2021-12-12 LAB — BASIC METABOLIC PANEL
Anion gap: 9 (ref 5–15)
BUN: 15 mg/dL (ref 6–20)
CO2: 21 mmol/L — ABNORMAL LOW (ref 22–32)
Calcium: 9.6 mg/dL (ref 8.9–10.3)
Chloride: 106 mmol/L (ref 98–111)
Creatinine, Ser: 0.66 mg/dL (ref 0.44–1.00)
GFR, Estimated: >60 mL/min (ref >60)
Glucose, Bld: 167 mg/dL — ABNORMAL HIGH (ref 70–99)
Potassium: 4.5 mmol/L (ref 3.5–5.1)
Sodium: 136 mmol/L (ref 135–145)

## 2021-12-12 LAB — HIV ANTIBODY (ROUTINE TESTING W REFLEX): HIV Screen 4th Generation wRfx: NONREACTIVE

## 2021-12-12 LAB — MAGNESIUM: Magnesium: 2.7 mg/dL — ABNORMAL HIGH (ref 1.7–2.4)

## 2021-12-12 MED ORDER — MOMETASONE FURO-FORMOTEROL FUM 100-5 MCG/ACT IN AERO
2.0000 | INHALATION_SPRAY | Freq: Two times a day (BID) | RESPIRATORY_TRACT | Status: DC
  Administered 2021-12-13 – 2021-12-14 (×2): 2 via RESPIRATORY_TRACT
  Filled 2021-12-12 (×2): qty 8.8

## 2021-12-12 MED ORDER — GUAIFENESIN-DM 100-10 MG/5ML PO SYRP: 5.0000 mL | ORAL_SOLUTION | ORAL | Status: DC | PRN

## 2021-12-12 MED ORDER — IPRATROPIUM-ALBUTEROL 0.5-2.5 (3) MG/3ML IN SOLN
3.0000 mL | Freq: Four times a day (QID) | RESPIRATORY_TRACT | Status: DC
  Administered 2021-12-13 (×3): 3 mL via RESPIRATORY_TRACT
  Filled 2021-12-12 (×3): qty 3

## 2021-12-12 MED ORDER — FUROSEMIDE 10 MG/ML IJ SOLN
60.0000 mg | Freq: Once | INTRAMUSCULAR | Status: AC
  Administered 2021-12-13: 60 mg via INTRAVENOUS
  Filled 2021-12-12: qty 6

## 2021-12-12 MED ORDER — ORAL CARE MOUTH RINSE: 15.0000 mL | OROMUCOSAL | Status: DC | PRN

## 2021-12-12 MED ORDER — IPRATROPIUM-ALBUTEROL 0.5-2.5 (3) MG/3ML IN SOLN
3.0000 mL | RESPIRATORY_TRACT | Status: DC | PRN
  Filled 2021-12-12: qty 3

## 2021-12-12 NOTE — Assessment & Plan Note (Addendum)
Continue blood pressure control with carvedilol, amlodipine and entresto.

## 2021-12-12 NOTE — Assessment & Plan Note (Addendum)
Mild vascular congestion on chest radiograph. Will add one dose of IV furosemide for acute pulmonary edema Continue blood pressure monitoring.  Continue with carvedilol, amlodipine and entresto.

## 2021-12-12 NOTE — Assessment & Plan Note (Signed)
Insulin sliding scale for glucose cover and monitoring

## 2021-12-12 NOTE — Assessment & Plan Note (Signed)
Smoking cessation counseling 

## 2021-12-12 NOTE — ED Notes (Signed)
Pt titrated to 1 L [place]. Sats holding at 98%.

## 2021-12-12 NOTE — Hospital Course (Signed)
Alexandra Bell was admitted to the hospital with the working diagnosis of asthma exacerbation in the setting of influenza A infection.   56 yo female with the past medical history of hypertrophic cardiomyopathy, atrial fibrillation, hypertension, dyslipidemia, and asthma who presented with dyspnea, cough and aches. Reported 3 days of cough, general aches and pains, rhinorrhea and diarrhea. Positive sick contact at home. Her symptoms evolved into wheezing and dyspnea. She was evaluated at a urgent care and found hypoxic with 02 saturation in the 80's. She was referred to the ED for further evaluation. On her initial physical examination her blood pressure was 136/65, HR 81, RR 29 and 02 saturation 80's on room air and 96% on supplemental 02 per [place]. Lungs with decreased breath sounds with prolonged expiratory phase and expiratory wheezing, heart with S1 and S2 present irregularly irregular, with no gallops or rubs, abdomen with no distention and no lower extremity edema.   NA 138, K 4,2 CL 106 bicarbonate 24 glucose 126, bun 12 cr 0,67  BNP 1,127  Wbc 10,0 hgb 14.2 plt 225  Influenza A positive Covid 19 negative   Chest radiograph with cardiomegaly, with bilateral hilar vascular congestion and cephalization of the vasculature. Fluid in the right fissure. Pacemaker in place with one atrial and one ventricular lead.   EKG 118 bpm, left axis, normal intervals, atrial fibrillation rhythm with no significant ST segment changes, negative T wave lead I and AvL.   Patient was placed on oseltamivir and systemic corticosteroids.

## 2021-12-12 NOTE — Progress Notes (Signed)
Progress Note   Patient: Alexandra Bell RCV:[id number] DOB: [birth year] DOA: 12/11/2021     0 DOS: the patient was seen and examined on 12/12/2021   Brief hospital course: Alexandra Bell was admitted to the hospital with the working diagnosis of asthma exacerbation in the setting of influenza A infection.   56 yo female with the past medical history of hypertrophic cardiomyopathy, atrial fibrillation, hypertension, dyslipidemia, and asthma who presented with dyspnea, cough and aches. Reported 3 days of cough, general aches and pains, rhinorrhea and diarrhea. Positive sick contact at home. Her symptoms evolved into wheezing and dyspnea. She was evaluated at a urgent care and found hypoxic with 02 saturation in the 80's. She was referred to the ED for further evaluation. On her initial physical examination her blood pressure was 136/65, HR 81, RR 29 and 02 saturation 80's on room air and 96% on supplemental 02 per [place]. Lungs with decreased breath sounds with prolonged expiratory phase and expiratory wheezing, heart with S1 and S2 present irregularly irregular, with no gallops or rubs, abdomen with no distention and no lower extremity edema.   NA 138, K 4,2 CL 106 bicarbonate 24 glucose 126, bun 12 cr 0,67  BNP 1,127  Wbc 10,0 hgb 14.2 plt 225  Influenza A positive Covid 19 negative   Chest radiograph with cardiomegaly, with bilateral hilar vascular congestion and cephalization of the vasculature. Fluid in the right fissure. Pacemaker in place with one atrial and one ventricular lead.   EKG 118 bpm, left axis, normal intervals, atrial fibrillation rhythm with no significant ST segment changes, negative T wave lead I and AvL.   Patient was placed on oseltamivir and systemic corticosteroids.    Assessment and Plan: * Acute asthma exacerbation Acute influenza A infection.  Patient continue to have dyspnea, not much wheezing Plan to continue systemic steroids and bronchodilator therapy Antitussive  agents and airway clearing techniques Inhaled corticosteroids.  Oseltamivir   Acute hypoxemic respiratory failure, continue with supplemental 02 per [place] to keep 02 saturation 92% or greater.   HOCM (hypertrophic obstructive cardiomyopathy) (HCC) Mild vascular congestion on chest radiograph. Will add one dose of IV furosemide for acute pulmonary edema Continue blood pressure monitoring.  Continue with carvedilol, amlodipine and entresto.   AF (paroxysmal atrial fibrillation) (HCC) Continue rate control atrial fibrillation and anticoagulation   Essential hypertension Continue blood pressure control with carvedilol, amlodipine and entresto.   Type 2 diabetes mellitus (HCC) Insulin sliding scale for glucose cover and monitoring   Tobacco use disorder Smoking cessation counseling.         Subjective: Patient with no chest pain, continue to have dyspnea, no palpitations   Physical Exam: Vitals:   12/12/21 1230 12/12/21 1344 12/12/21 1731 12/12/21 1754  BP: (!) 112/59  (!) 112/55   Pulse:   76   Resp: (!) 22  (!) 21   Temp:  99.1 F (37.3 C)  98.6 F (37 C)  TempSrc:  Oral  Oral  SpO2:   98%   Weight:      Height:       Neurology awake and alert ENT with mild pallor Cardiovascular S1 and S2 present, irregularly irregular with no gallops, rubs or murmurs Respiratory with prolonged expiratory phase and decreased breath sounds with no wheezing or rhonchi Abdomen with no distention Trace lower extremity edema  Data Reviewed:    Family Communication: no family at the bedside   Disposition: Status is: Inpatient Remains inpatient appropriate because: IV steroids and respiratory  support   Planned Discharge Destination: Home    Author: Tawni Millers, MD 12/12/2021 6:52 PM  For on call review [email or web address].

## 2021-12-12 NOTE — ED Notes (Signed)
Pt had episode of diarrhea 

## 2021-12-12 NOTE — Assessment & Plan Note (Addendum)
Acute influenza A infection.  Patient continue to have dyspnea, not much wheezing Plan to continue systemic steroids and bronchodilator therapy Antitussive agents and airway clearing techniques Inhaled corticosteroids.  Oseltamivir   Acute hypoxemic respiratory failure, continue with supplemental 02 per [place] to keep 02 saturation 92% or greater.

## 2021-12-12 NOTE — ED Notes (Signed)
Pt's sats 83% on RA. Oxygen was not in nose. Oxygen -placed back on and sats up to 94%

## 2021-12-12 NOTE — Assessment & Plan Note (Signed)
Continue rate control atrial fibrillation and anticoagulation

## 2021-12-12 NOTE — ED Notes (Signed)
Pt's sats dropped to 83% on RA when asleep. Oxygen 2 L [place] placed back on pt at this time.

## 2021-12-12 NOTE — ED Notes (Signed)
Sats 100

## 2021-12-13 DIAGNOSIS — I10 Essential (primary) hypertension: Secondary | ICD-10-CM | POA: Diagnosis not present

## 2021-12-13 DIAGNOSIS — J4521 Mild intermittent asthma with (acute) exacerbation: Secondary | ICD-10-CM | POA: Diagnosis not present

## 2021-12-13 DIAGNOSIS — I421 Obstructive hypertrophic cardiomyopathy: Secondary | ICD-10-CM | POA: Diagnosis not present

## 2021-12-13 LAB — CBC
HCT: 42 % (ref 36.0–46.0)
Hemoglobin: 13.8 g/dL (ref 12.0–15.0)
MCH: 30.5 pg (ref 26.0–34.0)
MCHC: 32.9 g/dL (ref 30.0–36.0)
MCV: 92.9 fL (ref 80.0–100.0)
Platelets: 215 10*3/uL (ref 150–400)
RBC: 4.52 MIL/uL (ref 3.87–5.11)
RDW: 13.2 % (ref 11.5–15.5)
WBC: 12.1 10*3/uL — ABNORMAL HIGH (ref 4.0–10.5)
nRBC: 0 % (ref 0.0–0.2)

## 2021-12-13 LAB — BASIC METABOLIC PANEL
Anion gap: 9 (ref 5–15)
BUN: 28 mg/dL — ABNORMAL HIGH (ref 6–20)
CO2: 27 mmol/L (ref 22–32)
Calcium: 9.5 mg/dL (ref 8.9–10.3)
Chloride: 101 mmol/L (ref 98–111)
Creatinine, Ser: 0.82 mg/dL (ref 0.44–1.00)
GFR, Estimated: >60 mL/min (ref >60)
Glucose, Bld: 139 mg/dL — ABNORMAL HIGH (ref 70–99)
Potassium: 4.1 mmol/L (ref 3.5–5.1)
Sodium: 137 mmol/L (ref 135–145)

## 2021-12-13 LAB — GLUCOSE, CAPILLARY
Glucose-Capillary: 109 mg/dL — ABNORMAL HIGH (ref 70–99)
Glucose-Capillary: 112 mg/dL — ABNORMAL HIGH (ref 70–99)
Glucose-Capillary: 132 mg/dL — ABNORMAL HIGH (ref 70–99)
Glucose-Capillary: 148 mg/dL — ABNORMAL HIGH (ref 70–99)
Glucose-Capillary: 187 mg/dL — ABNORMAL HIGH (ref 70–99)

## 2021-12-13 LAB — MRSA NEXT GEN BY PCR, NASAL: MRSA by PCR Next Gen: NOT DETECTED

## 2021-12-13 MED ORDER — IPRATROPIUM-ALBUTEROL 0.5-2.5 (3) MG/3ML IN SOLN
3.0000 mL | Freq: Two times a day (BID) | RESPIRATORY_TRACT | Status: DC
  Administered 2021-12-14: 3 mL via RESPIRATORY_TRACT
  Filled 2021-12-13: qty 3

## 2021-12-13 NOTE — Progress Notes (Signed)
PROGRESS NOTE  Alexandra Bell JGG:[id number] DOB: 01-10-1965 DOA: 12/11/2021 PCP: [name], FNP   LOS: 1 day   Brief Narrative / Interim history: 56 year old female with hypertrophic cardiomyopathy, PAF, HTN, HLD, asthma, ongoing tobacco use comes to the hospital with several days of cough, general aches, and had sick contacts at home.  She was also been having worsening shortness of breath especially with ambulation, and worsening wheezing.  She was hypoxic at urgent care, satting in the 80s, tachypneic and she was sent to the ER.  She was found to be flu positive, was placed on Tamiflu, steroids and was admitted to the hospital.  Subjective / 24h Interval events: Feeling slightly better.  Tells me she has not been ambulating yet.  Assesement and Plan: Principal Problem:   Acute asthma exacerbation Active Problems:   HOCM (hypertrophic obstructive cardiomyopathy) (HCC)   AF (paroxysmal atrial fibrillation) (HCC)   Essential hypertension   Type 2 diabetes mellitus (HCC)   Tobacco use disorder   Principal problem Asthma exacerbation due to flu A infection, with acute hypoxic respiratory failure-continues to have dyspnea, wheezing today.  Continue Tamiflu, steroids, nebulizers.  Ambulate, wean off oxygen as tolerated  Active problems Hypertrophic obstructive cardiomyopathy-had some vascular congestion on chest x-ray, received IV Lasix x 1.  Euvolemic this morning.  Continue Entresto.  Most recent 2D echo done September 2023 shows LVEF 70-75%, severe LV hypertrophy and grade 1 diastolic dysfunction  PAF-continue rate control with Coreg, continue Eliquis  Tobacco use-counseled for cessation  Essential hypertension-continue Coreg, Norvasc, Entresto  DM2-continue sliding scale  Scheduled Meds:  amLODipine  10 mg Oral Daily   apixaban  5 mg Oral BID   carvedilol  25 mg Oral BID WC   gabapentin  300 mg Oral BID   insulin aspart  0-5 Units Subcutaneous QHS   insulin aspart   0-6 Units Subcutaneous TID WC   ipratropium-albuterol  3 mL Nebulization Q6H   melatonin  5 mg Oral QHS   mometasone-formoterol  2 puff Inhalation BID   oseltamivir  75 mg Oral BID   pantoprazole  40 mg Oral BID   predniSONE  40 mg Oral Q breakfast   sacubitril-valsartan  1 tablet Oral BID   sodium chloride flush  3 mL Intravenous Q12H   Continuous Infusions: PRN Meds:.acetaminophen **OR** acetaminophen, diclofenac Sodium, guaiFENesin-dextromethorphan, HYDROcodone-acetaminophen, ipratropium-albuterol, ondansetron **OR** ondansetron (ZOFRAN) IV, mouth rinse, polyethylene glycol  Current Outpatient Medications  Medication Instructions   acetaminophen (TYLENOL) 1,000 mg, Oral, Every 6 hours PRN   albuterol (PROVENTIL) (2.5 MG/3ML) 0.083% nebulizer solution Inhale 3 mL 3 times a day by nebulization route as needed for 30 days.   albuterol (VENTOLIN HFA) 108 (90 Base) MCG/ACT inhaler 2 puffs, Inhalation, Every 6 hours PRN, For asthma   amLODipine (NORVASC) 10 mg, Oral, Daily   apixaban (ELIQUIS) 5 MG TABS tablet Take 1 tablet by mouth twice daily   carvedilol (COREG) 25 mg, Oral, 2 times daily   diclofenac Sodium (VOLTAREN) 2 g, Topical, Daily PRN   Farxiga 10 mg, Oral, Daily before breakfast   ferrous sulfate 325 mg, Oral, 2 times daily with meals   furosemide (LASIX) 80 MG tablet Take 1 tablet by mouth once daily   gabapentin (NEURONTIN) 300 mg, Oral, 2 times daily   Magnesium 250 mg, Oral, Daily   melatonin 5 mg, Oral, Daily   meloxicam (MOBIC) 15 mg, Oral, Daily PRN   metFORMIN (GLUCOPHAGE) 500 mg, Oral, 2 times daily with meals  Multiple Vitamin (MULTIVITAMIN WITH MINERALS) TABS tablet 1 tablet, Oral, Daily   pantoprazole (PROTONIX) 40 mg, Oral, 2 times daily   potassium chloride SA (KLOR-CON M) 20 MEQ tablet 20 mEq, Oral, Daily   sacubitril-valsartan (ENTRESTO) 24-26 MG 1 tablet, Oral, 2 times daily    Diet Orders (From admission, onward)     Start     Ordered   12/11/21 2256   Diet regular Room service appropriate? Yes; Fluid consistency: Thin  Diet effective now       Question Answer Comment  Room service appropriate? Yes   Fluid consistency: Thin      12/11/21 2256            DVT prophylaxis:  apixaban (ELIQUIS) tablet 5 mg   Lab Results  Component Value Date   PLT 215 12/13/2021      Code Status: Full Code  Family Communication: no family at bedside   Status is: Inpatient Remains inpatient appropriate because: Hypoxia, wheezing, persistent DOE  Level of care: Telemetry Cardiac  Consultants:  None  Objective: Vitals:   12/13/21 0330 12/13/21 0744 12/13/21 0754 12/13/21 0907  BP: 116/88  127/62 134/62  Pulse: 69 78 69   Resp: '12 17 16   '$ Temp: 98 F (36.7 C)  98 F (36.7 C)   TempSrc: Oral  Oral   SpO2: 98% 95% 99%   Weight:      Height:        Intake/Output Summary (Last 24 hours) at 12/13/2021 1111 Last data filed at 12/13/2021 0330 Gross per 24 hour  Intake 240 ml  Output 400 ml  Net -160 ml   Wt Readings from Last 3 Encounters:  12/12/21 114.5 kg  09/07/21 111.1 kg  08/10/21 111.8 kg    Examination:  Constitutional: NAD Eyes: no scleral icterus ENMT: Mucous membranes are moist.  Neck: normal, supple Respiratory: Faint end expiratory wheezing. Cardiovascular: Regular rate and rhythm, no murmurs / rubs / gallops. No LE edema.  Abdomen: non distended, no tenderness. Bowel sounds positive.  Musculoskeletal: no clubbing / cyanosis.  Skin: no rashes Neurologic: non focal   Data Reviewed: I have independently reviewed following labs and imaging studies   CBC Recent Labs  Lab 12/11/21 2000 12/11/21 2011 12/12/21 0252 12/13/21 0019  WBC 10.0  --  7.2 12.1*  HGB 14.2 15.3* 13.9 13.8  HCT 43.0 45.0 43.2 42.0  PLT 225  --  222 215  MCV 94.3  --  96.4 92.9  MCH 31.1  --  31.0 30.5  MCHC 33.0  --  32.2 32.9  RDW 13.6  --  13.4 13.2  LYMPHSABS 1.3  --   --   --   MONOABS 1.5*  --   --   --   EOSABS 0.0  --    --   --   BASOSABS 0.0  --   --   --     Recent Labs  Lab 12/11/21 2000 12/11/21 2011 12/12/21 0252 12/13/21 0019  NA 138 137 136 137  K 4.2 4.2 4.5 4.1  CL 106  --  106 101  CO2 24  --  21* 27  GLUCOSE 126*  --  167* 139*  BUN 12  --  15 28*  CREATININE 0.67  --  0.66 0.82  CALCIUM 9.8  --  9.6 9.5  AST 21  --   --   --   ALT 14  --   --   --   [facility]  63  --   --   --   BILITOT 0.6  --   --   --   ALBUMIN 3.4*  --   --   --   MG  --   --  2.7*  --   BNP 1,127.5*  --   --   --     ------------------------------------------------------------------------------------------------------------------ No results for input(s): "CHOL", "HDL", "LDLCALC", "TRIG", "CHOLHDL", "LDLDIRECT" in the last 72 hours.  Lab Results  Component Value Date   HGBA1C 5.4 08/10/2021   ------------------------------------------------------------------------------------------------------------------ No results for input(s): "TSH", "T4TOTAL", "T3FREE", "THYROIDAB" in the last 72 hours.  Invalid input(s): "FREET3"  Cardiac Enzymes No results for input(s): "CKMB", "TROPONINI", "MYOGLOBIN" in the last 168 hours.  Invalid input(s): "CK" ------------------------------------------------------------------------------------------------------------------    Component Value Date/Time   BNP 1,127.5 (H) 12/11/2021 2000    CBG: Recent Labs  Lab 12/11/21 2354 12/12/21 0920 12/12/21 1435 12/12/21 2358 12/13/21 0623  GLUCAP 149* 157* 221* 148* 132*    Recent Results (from the past 240 hour(s))  Resp Panel by RT-PCR (Flu A&B, Covid) Anterior Nasal Swab     Status: Abnormal   Collection Time: 12/11/21  8:16 PM   Specimen: Anterior Nasal Swab  Result Value Ref Range Status   SARS Coronavirus 2 by RT PCR NEGATIVE NEGATIVE Final    Comment: (NOTE) SARS-CoV-2 target nucleic acids are NOT DETECTED.  The SARS-CoV-2 RNA is generally detectable in upper respiratory specimens during the acute phase of  infection. The lowest concentration of SARS-CoV-2 viral copies this assay can detect is 138 copies/mL. A negative result does not preclude SARS-Cov-2 infection and should not be used as the sole basis for treatment or other patient management decisions. A negative result may occur with  improper specimen collection/handling, submission of specimen other than nasopharyngeal swab, presence of viral mutation(s) within the areas targeted by this assay, and inadequate number of viral copies(<138 copies/mL). A negative result must be combined with clinical observations, patient history, and epidemiological information. The expected result is Negative.  Fact Sheet for Patients:  EntrepreneurPulse.com.au  Fact Sheet for Healthcare Providers:  IncredibleEmployment.be  This test is no t yet approved or cleared by the [place] FDA and  has been authorized for detection and/or diagnosis of SARS-CoV-2 by FDA under an Emergency Use Authorization (EUA). This EUA will remain  in effect (meaning this test can be used) for the duration of the COVID-19 declaration under Section 564(b)(1) of the Act, 21 U.S.C.section 360bbb-3(b)(1), unless the authorization is terminated  or revoked sooner.       Influenza A by PCR POSITIVE (A) NEGATIVE Final   Influenza B by PCR NEGATIVE NEGATIVE Final    Comment: (NOTE) The Xpert Xpress SARS-CoV-2/FLU/RSV plus assay is intended as an aid in the diagnosis of influenza from Nasopharyngeal swab specimens and should not be used as a sole basis for treatment. Nasal washings and aspirates are unacceptable for Xpert Xpress SARS-CoV-2/FLU/RSV testing.  Fact Sheet for Patients: EntrepreneurPulse.com.au  Fact Sheet for Healthcare Providers: IncredibleEmployment.be  This test is not yet approved or cleared by the [place] FDA and has been authorized for detection and/or diagnosis of  SARS-CoV-2 by FDA under an Emergency Use Authorization (EUA). This EUA will remain in effect (meaning this test can be used) for the duration of the COVID-19 declaration under Section 564(b)(1) of the Act, 21 U.S.C. section 360bbb-3(b)(1), unless the authorization is terminated or revoked.  Performed at [facility], [place] [address], [place], [place] [address]  MRSA Next Gen by PCR, Nasal     Status: None   Collection Time: 12/12/21 11:38 PM   Specimen: Nasal Mucosa; Nasal Swab  Result Value Ref Range Status   MRSA by PCR Next Gen NOT DETECTED NOT DETECTED Final    Comment: (NOTE) The GeneXpert MRSA Assay (FDA approved for NASAL specimens only), is one component of a comprehensive MRSA colonization surveillance program. It is not intended to diagnose MRSA infection nor to guide or monitor treatment for MRSA infections. Test performance is not FDA approved in patients less than 48 years old. Performed at [facility], [place] [address], [place], [place] [address]      Radiology Studies: No results found.   Marzetta Board, MD, PhD [facility]  Between 7 am - 7 pm I am available, please contact me via Amion (for emergencies) or Securechat (non urgent messages)  Between 7 pm - 7 am I am not available, please contact night coverage MD/APP via Amion

## 2021-12-13 NOTE — Progress Notes (Signed)
Mobility Specialist Progress Note    12/13/21 1352  Mobility  Activity Ambulated with assistance in hallway  Level of Assistance Contact guard assist, steadying assist  Assistive Device Cane  Distance Ambulated (ft) 120 ft  Activity Response Tolerated fair  Mobility Referral Yes  $Mobility charge 1 Mobility   Pre-Mobility: 70 HR, 95% SpO2 During Mobility: 120 HR, >/=90% SpO2 Post-Mobility: 84 HR, 97% SpO2  Pt received in bed and agreeable. No complaints. Required 2LO2 to maintain SpO2 >=90%. Took x1 extended standing rest break. Encouraged pursed lip breathing. Returned to sitting EOB with call bell in reach.   Hildred Alamin Mobility Specialist  Please Psychologist, sport and exercise or Rehab Office at [phone]

## 2021-12-13 NOTE — TOC Initial Note (Signed)
Transition of Care [facility]) - Initial/Assessment Note    Patient Details  Name: Alexandra Bell MRN: [id number] Date of Birth: Mar 27, 1965  [facility] CM/SW Contact:    Ninfa Meeker, RN Phone Number: 12/13/2021, 3:15 PM  Clinical Narrative:                 Patient is a 56 yr old female  who presented to ED with cough, SOB, wheezing, and hypoxia d/t asthma exacerbation precipitated by influenza A infection.    Transition of Care [facility]) Department has reviewed patient and no TOC needs have been identified at this time. We will continue to monitor patient advancement through Interdisciplinary progressions and if new patient needs arise, please place a consult.       Patient Goals and CMS Choice        Expected Discharge Plan and Services                                                Prior Living Arrangements/Services                       Activities of Daily Living Home Assistive Devices/Equipment: Cane (specify quad or straight) ADL Screening (condition at time of admission) Patient's cognitive ability adequate to safely complete daily activities?: Yes Is the patient deaf or have difficulty hearing?: No Does the patient have difficulty seeing, even when wearing glasses/contacts?: No Does the patient have difficulty concentrating, remembering, or making decisions?: No Patient able to express need for assistance with ADLs?: Yes Does the patient have difficulty dressing or bathing?: No Independently performs ADLs?: Yes (appropriate for developmental age) Does the patient have difficulty walking or climbing stairs?: No Weakness of Legs: Both Weakness of Arms/Hands: None  Permission Sought/Granted                  Emotional Assessment              Admission diagnosis:  Shortness of breath [R06.02] Influenza A [J10.1] Acute on chronic congestive heart failure, unspecified heart failure type ([place]) [I50.9] Patient Active  Problem List   Diagnosis Date Noted   Influenza A 12/11/2021   Acute respiratory failure with hypoxia ([place]) 12/11/2021   Pain in both knees 03/20/2021   Acute blood loss anemia    Gastritis and gastroduodenitis    Symptomatic anemia 10/04/2020   Duodenal ulcer    Chronic anticoagulation    AF (paroxysmal atrial fibrillation) ([place]) 09/11/2020   Tobacco use disorder 09/11/2020   Type 2 diabetes mellitus ([place]) 09/11/2020   Diabetic neuropathy ([place]) 09/11/2020   NSVT (nonsustained ventricular tachycardia) ([place]) 03/10/2020   HOCM (hypertrophic obstructive cardiomyopathy) ([place]) 12/10/2019   Eosinophilia 01/16/2011   Colitis, eosinophilic 35/32/9924   Leukocytosis 11/28/2010   Essential hypertension 05/18/2009   Acute asthma exacerbation 05/18/2009   Nonspecific (abnormal) findings on radiological and other examination of body structure 05/18/2009   COMPUTERIZED TOMOGRAPHY, CHEST, ABNORMAL 05/18/2009   PCP:  [name], [place] [facility]:   [place], [place] - [address] [place] [address] Phone: [phone] Fax: [phone]  [facility] - [place] - [address] [place]) [place] [address] Phone: [phone] Fax: [phone]     Social Determinants of Health (SDOH)  Interventions    Readmission Risk Interventions     No data to display

## 2021-12-13 NOTE — Plan of Care (Signed)
  Problem: Activity: Goal: Ability to tolerate increased activity will improve Outcome: Progressing   Problem: Respiratory: Goal: Ability to maintain a clear airway will improve Outcome: Progressing Goal: Levels of oxygenation will improve 12/13/2021 0152 by Rennis Petty, RN Outcome: Progressing 12/13/2021 0152 by Rennis Petty, RN Outcome: Progressing   Problem: Clinical Measurements: Goal: Respiratory complications will improve Outcome: Progressing Goal: Cardiovascular complication will be avoided Outcome: Progressing   Problem: Elimination: Goal: Will not experience complications related to urinary retention Outcome: Progressing   Problem: Activity: Goal: Risk for activity intolerance will decrease Outcome: Not Progressing

## 2021-12-14 DIAGNOSIS — J4521 Mild intermittent asthma with (acute) exacerbation: Secondary | ICD-10-CM | POA: Diagnosis not present

## 2021-12-14 DIAGNOSIS — J101 Influenza due to other identified influenza virus with other respiratory manifestations: Secondary | ICD-10-CM | POA: Diagnosis not present

## 2021-12-14 DIAGNOSIS — I480 Paroxysmal atrial fibrillation: Secondary | ICD-10-CM | POA: Diagnosis not present

## 2021-12-14 DIAGNOSIS — I10 Essential (primary) hypertension: Secondary | ICD-10-CM | POA: Diagnosis not present

## 2021-12-14 LAB — CBC
HCT: 41.9 % (ref 36.0–46.0)
Hemoglobin: 13.8 g/dL (ref 12.0–15.0)
MCH: 30.7 pg (ref 26.0–34.0)
MCHC: 32.9 g/dL (ref 30.0–36.0)
MCV: 93.3 fL (ref 80.0–100.0)
Platelets: 243 10*3/uL (ref 150–400)
RBC: 4.49 MIL/uL (ref 3.87–5.11)
RDW: 13 % (ref 11.5–15.5)
WBC: 12.7 10*3/uL — ABNORMAL HIGH (ref 4.0–10.5)
nRBC: 0 % (ref 0.0–0.2)

## 2021-12-14 LAB — BASIC METABOLIC PANEL
Anion gap: 6 (ref 5–15)
BUN: 23 mg/dL — ABNORMAL HIGH (ref 6–20)
CO2: 31 mmol/L (ref 22–32)
Calcium: 9.8 mg/dL (ref 8.9–10.3)
Chloride: 102 mmol/L (ref 98–111)
Creatinine, Ser: 0.67 mg/dL (ref 0.44–1.00)
GFR, Estimated: >60 mL/min (ref >60)
Glucose, Bld: 110 mg/dL — ABNORMAL HIGH (ref 70–99)
Potassium: 4.7 mmol/L (ref 3.5–5.1)
Sodium: 139 mmol/L (ref 135–145)

## 2021-12-14 LAB — GLUCOSE, CAPILLARY
Glucose-Capillary: 106 mg/dL — ABNORMAL HIGH (ref 70–99)
Glucose-Capillary: 145 mg/dL — ABNORMAL HIGH (ref 70–99)

## 2021-12-14 MED ORDER — PREDNISONE 20 MG PO TABS: 40.0000 mg | ORAL_TABLET | Freq: Every day | ORAL | 0 refills | Status: AC

## 2021-12-14 MED ORDER — NICOTINE 14 MG/24HR TD PT24: 14.0000 mg | MEDICATED_PATCH | TRANSDERMAL | 0 refills | Status: AC

## 2021-12-14 MED ORDER — GUAIFENESIN-DM 100-10 MG/5ML PO SYRP: 5.0000 mL | ORAL_SOLUTION | ORAL | 0 refills | Status: AC | PRN

## 2021-12-14 MED ORDER — OSELTAMIVIR PHOSPHATE 75 MG PO CAPS: 75.0000 mg | ORAL_CAPSULE | Freq: Two times a day (BID) | ORAL | 0 refills | Status: AC

## 2021-12-14 NOTE — Progress Notes (Signed)
Mobility Specialist Progress Note    12/14/21 0927  Mobility  Activity Ambulated with assistance in hallway  Level of Assistance Standby assist, set-up cues, supervision of patient - no hands on  Assistive Device Front wheel walker  Distance Ambulated (ft) 120 ft  Activity Response Tolerated well  Mobility Referral Yes  $Mobility charge 1 Mobility   Pre-Mobility: 92 HR, 94% SpO2 During Mobility: 97 HR, >/=89% SpO2 Post-Mobility: 72 HR, 93% SpO2  Pt received sitting EOB and agreeable. Encouraged pursed lip breathing. Tolerated on RA. Returned to sitting EOB with call bell in reach. Encouraged flutter valve and incentive spirometer use.   Hildred Alamin Mobility Specialist  Please Psychologist, sport and exercise or Rehab Office at [phone]

## 2021-12-14 NOTE — Plan of Care (Signed)

## 2021-12-14 NOTE — Progress Notes (Signed)
Nurse requested Mobility Specialist to perform oxygen saturation test with pt which includes removing pt from oxygen both at rest and while ambulating.  Below are the results from that testing.     Patient Saturations on Room Air at Rest = spO2 94%  Patient Saturations on Room Air while Ambulating = sp02 >/=89% .   At end of testing pt left in room on RA.    Reported results to nurse.

## 2021-12-14 NOTE — Discharge Summary (Signed)
Physician Discharge Summary  Alexandra Bell:[id number] DOB: 10-06-1965 DOA: 12/11/2021  PCP: [name], FNP  Admit date: 12/11/2021 Discharge date: 12/14/2021  Admitted From: home Disposition:  home  Recommendations for Outpatient Follow-up:  Follow up with PCP in 1-2 weeks  Home Health: none Equipment/Devices: none  Discharge Condition: stable CODE STATUS: Full code Diet Orders (From admission, onward)     Start     Ordered   12/11/21 2256  Diet regular Room service appropriate? Yes; Fluid consistency: Thin  Diet effective now       Question Answer Comment  Room service appropriate? Yes   Fluid consistency: Thin      12/11/21 2256            HPI: Per admitting MD, Alexandra Bell is a 56 y.o. female with medical history significant for HOCM with ICD, atrial fibrillation on Eliquis, hypertension, hyperlipidemia, and asthma who presents to the emergency department with cough, shortness of breath, and aches. Patient reports that she was exposed to family members with upper respiratory illness and then she herself developed cough, general aches, rhinorrhea, and loose stool on 12/09/2021.  She has gone on to develop worsening shortness of breath and wheezing associated with this.  She was initially evaluated at an urgent care where she was found to be saturating in the mid 80s on room air.  Hospital Course / Discharge diagnoses: Principal Problem:   Acute asthma exacerbation Active Problems:   HOCM (hypertrophic obstructive cardiomyopathy) (HCC)   AF (paroxysmal atrial fibrillation) (HCC)   Essential hypertension   Type 2 diabetes mellitus (HCC)   Tobacco use disorder   Principal problem Asthma exacerbation due to flu A infection, with acute hypoxic respiratory failure-patient was admitted to the hospital, placed on steroids, nebulizers as well as Tamiflu.  She gradually improved, she was able to be weaned off to room air, wheezing has resolved, she is  ambulating in the hallway without significant difficulties, feeling back to baseline, and will be discharged home in stable condition.  She has few more days of Tamiflu and steroids, and she will finish the treatment as an outpatient.  Active problems Hypertrophic obstructive cardiomyopathy-had some vascular congestion on chest x-ray, received IV Lasix x 1.  Euvolemic this morning.  Continue Entresto.  Most recent 2D echo done September 2023 shows LVEF 70-75%, severe LV hypertrophy and grade 1 diastolic dysfunction  PAF-continue rate control with Coreg, continue Eliquis Tobacco use-counseled for cessation, nicotine patch called in Essential hypertension-continue Coreg, Norvasc, Entresto DM2-continue home regimen  Sepsis ruled out   Discharge Instructions   Allergies as of 12/14/2021   No Known Allergies      Medication List     TAKE these medications    acetaminophen 500 MG tablet Commonly known as: TYLENOL Take 2 tablets (1,000 mg total) by mouth every 6 (six) hours as needed (For pain).   albuterol (2.5 MG/3ML) 0.083% nebulizer solution Commonly known as: PROVENTIL Inhale 3 mL 3 times a day by nebulization route as needed for 30 days.   albuterol 108 (90 Base) MCG/ACT inhaler Commonly known as: VENTOLIN HFA Inhale 2 puffs into the lungs every 6 (six) hours as needed. For asthma   amLODipine 10 MG tablet Commonly known as: NORVASC Take 1 tablet by mouth once daily   carvedilol 25 MG tablet Commonly known as: COREG Take 1 tablet (25 mg total) by mouth 2 (two) times daily.   diclofenac Sodium 1 % Gel Commonly known as: VOLTAREN Apply  2 g topically daily as needed (For pain).   Eliquis 5 MG Tabs tablet Generic drug: apixaban Take 1 tablet by mouth twice daily   Farxiga 10 MG Tabs tablet Generic drug: dapagliflozin propanediol TAKE 1 TABLET BY MOUTH ONCE DAILY BEFORE BREAKFAST   ferrous sulfate 325 (65 FE) MG EC tablet Take 1 tablet (325 mg total) by mouth 2  (two) times daily with a meal.   furosemide 80 MG tablet Commonly known as: LASIX Take 1 tablet by mouth once daily   gabapentin 300 MG capsule Commonly known as: NEURONTIN Take 1 capsule by mouth twice daily   guaiFENesin-dextromethorphan 100-10 MG/5ML syrup Commonly known as: ROBITUSSIN DM Take 5 mLs by mouth every 4 (four) hours as needed for cough.   Magnesium 250 MG Tabs Take 250 mg by mouth daily at 2 PM.   melatonin 5 MG Tabs Take 5 mg by mouth daily at 2 PM.   meloxicam 15 MG tablet Commonly known as: MOBIC Take 15 mg by mouth daily as needed.   metFORMIN 500 MG tablet Commonly known as: GLUCOPHAGE Take 1 tablet (500 mg total) by mouth 2 (two) times daily with a meal.   multivitamin with minerals Tabs tablet Take 1 tablet by mouth daily.   nicotine 14 mg/24hr patch Commonly known as: NICODERM CQ - dosed in mg/24 hours Place 1 patch (14 mg total) onto the skin daily.   oseltamivir 75 MG capsule Commonly known as: TAMIFLU Take 1 capsule (75 mg total) by mouth 2 (two) times daily for 2 days.   pantoprazole 40 MG tablet Commonly known as: PROTONIX Take 1 tablet (40 mg total) by mouth 2 (two) times daily.   potassium chloride SA 20 MEQ tablet Commonly known as: KLOR-CON M Take 1 tablet (20 mEq total) by mouth daily.   predniSONE 20 MG tablet Commonly known as: DELTASONE Take 2 tablets (40 mg total) by mouth daily with breakfast for 4 days. Start taking on: December 15, 2021   sacubitril-valsartan 24-26 MG Commonly known as: ENTRESTO Take 1 tablet by mouth 2 (two) times daily.       Consultations: None   Procedures/Studies:  DG Chest Portable 1 View  Result Date: 12/11/2021 CLINICAL DATA:  Shortness of breath EXAM: PORTABLE CHEST 1 VIEW COMPARISON:  Chest x-ray dated December 04, 2020 FINDINGS: Unchanged cardiomegaly. Left chest wall dual lead pacer with unchanged lead position. Bilateral interstitial opacities, similar to prior exam. No large  pleural effusion or evidence of pneumothorax. IMPRESSION: Unchanged cardiomegaly. Bilateral interstitial opacities, likely due to pulmonary edema. Electronically Signed   By: Yetta Glassman M.D.   On: 12/11/2021 20:43     Subjective: - no chest pain, shortness of breath, no abdominal pain, nausea or vomiting.   Discharge Exam: BP 124/62 (BP Location: Left Arm)   Pulse 78   Temp 98.5 F (36.9 C) (Oral)   Resp 17   Ht '5\' 5"'$  (1.651 m)   Wt 113.1 kg   SpO2 90%   BMI 41.49 kg/m   General: Pt is alert, awake, not in acute distress Cardiovascular: RRR, S1/S2 +, no rubs, no gallops Respiratory: CTA bilaterally, no wheezing, no rhonchi Abdominal: Soft, NT, ND, bowel sounds + Extremities: no edema, no cyanosis    The results of significant diagnostics from this hospitalization (including imaging, microbiology, ancillary and laboratory) are listed below for reference.     Microbiology: Recent Results (from the past 240 hour(s))  Resp Panel by RT-PCR (Flu A&B, Covid) Anterior Nasal Swab  Status: Abnormal   Collection Time: 12/11/21  8:16 PM   Specimen: Anterior Nasal Swab  Result Value Ref Range Status   SARS Coronavirus 2 by RT PCR NEGATIVE NEGATIVE Final    Comment: (NOTE) SARS-CoV-2 target nucleic acids are NOT DETECTED.  The SARS-CoV-2 RNA is generally detectable in upper respiratory specimens during the acute phase of infection. The lowest concentration of SARS-CoV-2 viral copies this assay can detect is 138 copies/mL. A negative result does not preclude SARS-Cov-2 infection and should not be used as the sole basis for treatment or other patient management decisions. A negative result may occur with  improper specimen collection/handling, submission of specimen other than nasopharyngeal swab, presence of viral mutation(s) within the areas targeted by this assay, and inadequate number of viral copies(<138 copies/mL). A negative result must be combined with clinical  observations, patient history, and epidemiological information. The expected result is Negative.  Fact Sheet for Patients:  EntrepreneurPulse.com.au  Fact Sheet for Healthcare Providers:  IncredibleEmployment.be  This test is no t yet approved or cleared by the [place] FDA and  has been authorized for detection and/or diagnosis of SARS-CoV-2 by FDA under an Emergency Use Authorization (EUA). This EUA will remain  in effect (meaning this test can be used) for the duration of the COVID-19 declaration under Section 564(b)(1) of the Act, 21 U.S.C.section 360bbb-3(b)(1), unless the authorization is terminated  or revoked sooner.       Influenza A by PCR POSITIVE (A) NEGATIVE Final   Influenza B by PCR NEGATIVE NEGATIVE Final    Comment: (NOTE) The Xpert Xpress SARS-CoV-2/FLU/RSV plus assay is intended as an aid in the diagnosis of influenza from Nasopharyngeal swab specimens and should not be used as a sole basis for treatment. Nasal washings and aspirates are unacceptable for Xpert Xpress SARS-CoV-2/FLU/RSV testing.  Fact Sheet for Patients: EntrepreneurPulse.com.au  Fact Sheet for Healthcare Providers: IncredibleEmployment.be  This test is not yet approved or cleared by the [place] FDA and has been authorized for detection and/or diagnosis of SARS-CoV-2 by FDA under an Emergency Use Authorization (EUA). This EUA will remain in effect (meaning this test can be used) for the duration of the COVID-19 declaration under Section 564(b)(1) of the Act, 21 U.S.C. section 360bbb-3(b)(1), unless the authorization is terminated or revoked.  Performed at [facility], [place] [address] [place], [place] [address]   MRSA Next Gen by PCR, Nasal     Status: None   Collection Time: 12/12/21 11:38 PM   Specimen: Nasal Mucosa; Nasal Swab  Result Value Ref Range Status   MRSA by PCR Next Gen NOT DETECTED  NOT DETECTED Final    Comment: (NOTE) The GeneXpert MRSA Assay (FDA approved for NASAL specimens only), is one component of a comprehensive MRSA colonization surveillance program. It is not intended to diagnose MRSA infection nor to guide or monitor treatment for MRSA infections. Test performance is not FDA approved in patients less than 5 years old. Performed at [facility], [place] [address], [place], [place] [address]      Labs: Basic Metabolic Panel: Recent Labs  Lab 12/11/21 2000 12/11/21 2011 12/12/21 0252 12/13/21 0019 12/14/21 0037  NA 138 137 136 137 139  K 4.2 4.2 4.5 4.1 4.7  CL 106  --  106 101 102  CO2 24  --  21* 27 31  GLUCOSE 126*  --  167* 139* 110*  BUN 12  --  15 28* 23*  CREATININE 0.67  --  0.66 0.82 0.67  CALCIUM 9.8  --  9.6 9.5 9.8  MG  --   --  2.7*  --   --    Liver Function Tests: Recent Labs  Lab 12/11/21 2000  AST 21  ALT 14  ALKPHOS 63  BILITOT 0.6  PROT 6.5  ALBUMIN 3.4*   CBC: Recent Labs  Lab 12/11/21 2000 12/11/21 2011 12/12/21 0252 12/13/21 0019 12/14/21 0037  WBC 10.0  --  7.2 12.1* 12.7*  NEUTROABS 7.2  --   --   --   --   HGB 14.2 15.3* 13.9 13.8 13.8  HCT 43.0 45.0 43.2 42.0 41.9  MCV 94.3  --  96.4 92.9 93.3  PLT 225  --  222 215 243   CBG: Recent Labs  Lab 12/13/21 1152 12/13/21 1655 12/13/21 2102 12/14/21 0547 12/14/21 1203  GLUCAP 109* 112* 187* 106* 145*   Hgb A1c No results for input(s): "HGBA1C" in the last 72 hours. Lipid Profile No results for input(s): "CHOL", "HDL", "LDLCALC", "TRIG", "CHOLHDL", "LDLDIRECT" in the last 72 hours. Thyroid function studies No results for input(s): "TSH", "T4TOTAL", "T3FREE", "THYROIDAB" in the last 72 hours.  Invalid input(s): "FREET3" Urinalysis    Component Value Date/Time   COLORURINE YELLOW 11/28/2010 1033   APPEARANCEUR HAZY (A) 11/28/2010 1033   LABSPEC 1.021 11/28/2010 1033   PHURINE 5.5 11/28/2010 1033   GLUCOSEU NEGATIVE 11/28/2010 1033    HGBUR MODERATE (A) 11/28/2010 1033   BILIRUBINUR negative 08/10/2021 1528   KETONESUR NEGATIVE 11/28/2010 1033   PROTEINUR Negative 08/10/2021 1528   PROTEINUR 30 (A) 11/28/2010 1033   UROBILINOGEN 0.2 08/10/2021 1528   UROBILINOGEN 0.2 11/28/2010 1033   NITRITE negative 08/10/2021 1528   NITRITE NEGATIVE 11/28/2010 1033   LEUKOCYTESUR Negative 08/10/2021 1528    FURTHER DISCHARGE INSTRUCTIONS:   Get Medicines reviewed and adjusted: Please take all your medications with you for your next visit with your Primary MD   Laboratory/radiological data: Please request your Primary MD to go over all hospital tests and procedure/radiological results at the follow up, please ask your Primary MD to get all Hospital records sent to his/her office.   In some cases, they will be blood work, cultures and biopsy results pending at the time of your discharge. Please request that your primary care M.D. goes through all the records of your hospital data and follows up on these results.   Also Note the following: If you experience worsening of your admission symptoms, develop shortness of breath, life threatening emergency, suicidal or homicidal thoughts you must seek medical attention immediately by calling 911 or calling your MD immediately  if symptoms less severe.   You must read complete instructions/literature along with all the possible adverse reactions/side effects for all the Medicines you take and that have been prescribed to you. Take any new Medicines after you have completely understood and accpet all the possible adverse reactions/side effects.    Do not drive when taking Pain medications or sleeping medications (Benzodaizepines)   Do not take more than prescribed Pain, Sleep and Anxiety Medications. It is not advisable to combine anxiety,sleep and pain medications without talking with your primary care practitioner   Special Instructions: If you have smoked or chewed Tobacco  in the last 2  yrs please stop smoking, stop any regular Alcohol  and or any Recreational drug use.   Wear Seat belts while driving.   Please note: You were cared for by a hospitalist during your hospital stay. Once  you are discharged, your primary care physician will handle any further medical issues. Please note that NO REFILLS for any discharge medications will be authorized once you are discharged, as it is imperative that you return to your primary care physician (or establish a relationship with a primary care physician if you do not have one) for your post hospital discharge needs so that they can reassess your need for medications and monitor your lab values.  Time coordinating discharge: 40 minutes  SIGNED:  Marzetta Board, MD, PhD 12/14/2021, 1:40 PM

## 2021-12-14 NOTE — Progress Notes (Signed)
Explained discharge instructions to patient. Reviewed follow up appointment and next medication administration times. Also reviewed education. Patient verbalized having an understanding for instructions given. All belongings are in the patient's possession to include TOC meds. IV was removed by staff RN. CCMD was notified. NTransported downstairs for discharge.

## 2021-12-14 NOTE — Plan of Care (Signed)
Problem: Education: Goal: Knowledge of disease or condition will improve Outcome: Progressing Goal: Knowledge of the prescribed therapeutic regimen will improve Outcome: Progressing Goal: Individualized Educational Video(s) Outcome: Progressing   Problem: Activity: Goal: Ability to tolerate increased activity will improve Outcome: Progressing Goal: Will verbalize the importance of balancing activity with adequate rest periods Outcome: Progressing   Problem: Respiratory: Goal: Ability to maintain a clear airway will improve Outcome: Progressing Goal: Levels of oxygenation will improve Outcome: Progressing Goal: Ability to maintain adequate ventilation will improve Outcome: Progressing   Problem: Education: Goal: Ability to describe self-care measures that may prevent or decrease complications (Diabetes Survival Skills Education) will improve Outcome: Progressing Goal: Individualized Educational Video(s) Outcome: Progressing   Problem: Coping: Goal: Ability to adjust to condition or change in health will improve Outcome: Progressing   Problem: Fluid Volume: Goal: Ability to maintain a balanced intake and output will improve Outcome: Progressing   Problem: Health Behavior/Discharge Planning: Goal: Ability to identify and utilize available resources and services will improve Outcome: Progressing Goal: Ability to manage health-related needs will improve Outcome: Progressing   Problem: Metabolic: Goal: Ability to maintain appropriate glucose levels will improve Outcome: Progressing   Problem: Nutritional: Goal: Maintenance of adequate nutrition will improve Outcome: Progressing Goal: Progress toward achieving an optimal weight will improve Outcome: Progressing   Problem: Skin Integrity: Goal: Risk for impaired skin integrity will decrease Outcome: Progressing   Problem: Tissue Perfusion: Goal: Adequacy of tissue perfusion will improve Outcome: Progressing    Problem: Education: Goal: Knowledge of General Education information will improve Description: Including pain rating scale, medication(s)/side effects and non-pharmacologic comfort measures Outcome: Progressing   Problem: Health Behavior/Discharge Planning: Goal: Ability to manage health-related needs will improve Outcome: Progressing   Problem: Clinical Measurements: Goal: Ability to maintain clinical measurements within normal limits will improve Outcome: Progressing Goal: Will remain free from infection Outcome: Progressing Goal: Diagnostic test results will improve Outcome: Progressing Goal: Respiratory complications will improve Outcome: Progressing Goal: Cardiovascular complication will be avoided Outcome: Progressing   Problem: Activity: Goal: Risk for activity intolerance will decrease Outcome: Progressing   Problem: Nutrition: Goal: Adequate nutrition will be maintained Outcome: Progressing   Problem: Coping: Goal: Level of anxiety will decrease Outcome: Progressing   Problem: Elimination: Goal: Will not experience complications related to bowel motility Outcome: Progressing Goal: Will not experience complications related to urinary retention Outcome: Progressing   Problem: Pain Managment: Goal: General experience of comfort will improve Outcome: Progressing   Problem: Safety: Goal: Ability to remain free from injury will improve Outcome: Progressing   Problem: Skin Integrity: Goal: Risk for impaired skin integrity will decrease Outcome: Progressing   Problem: Education: Goal: Knowledge of General Education information will improve Description: Including pain rating scale, medication(s)/side effects and non-pharmacologic comfort measures Outcome: Progressing   Problem: Health Behavior/Discharge Planning: Goal: Ability to manage health-related needs will improve Outcome: Progressing   Problem: Clinical Measurements: Goal: Ability to maintain  clinical measurements within normal limits will improve Outcome: Progressing Goal: Will remain free from infection Outcome: Progressing Goal: Diagnostic test results will improve Outcome: Progressing Goal: Respiratory complications will improve Outcome: Progressing Goal: Cardiovascular complication will be avoided Outcome: Progressing   Problem: Activity: Goal: Risk for activity intolerance will decrease Outcome: Progressing   Problem: Nutrition: Goal: Adequate nutrition will be maintained Outcome: Progressing   Problem: Coping: Goal: Level of anxiety will decrease Outcome: Progressing   Problem: Elimination: Goal: Will not experience complications related to bowel motility Outcome: Progressing Goal: Will not experience complications  related to urinary retention Outcome: Progressing   Problem: Pain Managment: Goal: General experience of comfort will improve Outcome: Progressing

## 2021-12-15 ENCOUNTER — Telehealth: Payer: Self-pay

## 2021-12-15 NOTE — Telephone Encounter (Signed)
Transition Care Management Unsuccessful Follow-up Telephone Call  Date of discharge and from where:  12/14/2021   Attempts:  1st Attempt  Reason for unsuccessful TCM follow-up call:  Left voice message

## 2021-12-18 ENCOUNTER — Telehealth: Payer: Self-pay

## 2021-12-18 NOTE — Telephone Encounter (Signed)
Transition Care Management Follow-up Telephone Call Date of discharge and from where: 12/14/2021  How have you been since you were released from the hospital? Pt states she is doing okay. She also reports a recent death in the family but she is doing okay.  Any questions or concerns? No  Items Reviewed: Did the pt receive and understand the discharge instructions provided? Yes  Medications obtained and verified? Yes  Other? Yes  Any new allergies since your discharge? No  Dietary orders reviewed? Yes Do you have support at home? Yes   Home Care and Equipment/Supplies: Were home health services ordered? no If so, what is the name of the agency? N/a  Has the agency set up a time to come to the patient's home? no Were any new equipment or medical supplies ordered?  No What is the name of the medical supply agency? N/a Were you able to get the supplies/equipment? no Do you have any questions related to the use of the equipment or supplies? No  Functional Questionnaire: (I = Independent and D = Dependent) ADLs: i  Bathing/Dressing- i  Meal Prep- i  Eating- i  Maintaining continence- i  Transferring/Ambulation- i  Managing Meds- i  Follow up appointments reviewed:  PCP Hospital f/u appt confirmed? Yes  Scheduled to see janece moore on n/a @ n/a. [facility] f/u appt confirmed? No  Scheduled to see n/a on n/a @ n/a. Are transportation arrangements needed? No  If their condition worsens, is the pt aware to call PCP or go to the Emergency Dept.? Yes Was the patient provided with contact information for the PCP's office or ED? Yes Was to pt encouraged to call back with questions or concerns? Yes

## 2021-12-20 ENCOUNTER — Telehealth: Payer: Self-pay

## 2021-12-20 NOTE — Telephone Encounter (Signed)
Left patient voicemail for apt on Monday [facility]. Told patient to call the office to confirm apt. Also, per provider, patient needs to start atorvastatin for cardiac protection & sch an apt for 8 week follow up.

## 2021-12-25 ENCOUNTER — Inpatient Hospital Stay: Payer: Commercial Managed Care - HMO | Admitting: Nurse Practitioner

## 2021-12-28 ENCOUNTER — Ambulatory Visit (INDEPENDENT_AMBULATORY_CARE_PROVIDER_SITE_OTHER): Payer: 59

## 2021-12-28 DIAGNOSIS — I421 Obstructive hypertrophic cardiomyopathy: Secondary | ICD-10-CM

## 2021-12-28 LAB — CUP PACEART REMOTE DEVICE CHECK
Battery Remaining Longevity: 144 mo
Battery Remaining Percentage: 100 %
Brady Statistic RA Percent Paced: 7 %
Brady Statistic RV Percent Paced: 0 %
Date Time Interrogation Session: 20231221005100
HighPow Impedance: 64 Ohm
Implantable Lead Connection Status: 753985
Implantable Lead Connection Status: 753985
Implantable Lead Implant Date: 20220318
Implantable Lead Implant Date: 20220318
Implantable Lead Location: 753859
Implantable Lead Location: 753860
Implantable Lead Model: 673
Implantable Lead Model: 7841
Implantable Lead Serial Number: 1122351
Implantable Lead Serial Number: 158410
Implantable Pulse Generator Implant Date: 20220318
Lead Channel Impedance Value: 389 Ohm
Lead Channel Impedance Value: 595 Ohm
Lead Channel Setting Pacing Amplitude: 2 V
Lead Channel Setting Pacing Amplitude: 2.5 V
Lead Channel Setting Pacing Pulse Width: 0.4 ms
Lead Channel Setting Sensing Sensitivity: 0.5 mV
Pulse Gen Serial Number: 593724
Zone Setting Status: 755011

## 2022-01-06 ENCOUNTER — Other Ambulatory Visit: Payer: Self-pay | Admitting: Cardiovascular Disease

## 2022-01-09 NOTE — Telephone Encounter (Signed)
Rx request sent to pharmacy.  

## 2022-01-11 ENCOUNTER — Encounter: Payer: Self-pay | Admitting: Nurse Practitioner

## 2022-01-11 ENCOUNTER — Ambulatory Visit
Admission: RE | Admit: 2022-01-11 | Discharge: 2022-01-11 | Disposition: A | Discharge status: Home / Self Care | Payer: Medicaid Other | Source: Ambulatory Visit | Attending: Nurse Practitioner | Admitting: Nurse Practitioner

## 2022-01-11 ENCOUNTER — Ambulatory Visit (INDEPENDENT_AMBULATORY_CARE_PROVIDER_SITE_OTHER): Payer: Medicaid Other | Admitting: Nurse Practitioner

## 2022-01-11 VITALS — BP 138/70 | HR 79 | Temp 99.0°F | Ht 65.0 in | Wt 248.0 lb

## 2022-01-11 DIAGNOSIS — Z8709 Personal history of other diseases of the respiratory system: Secondary | ICD-10-CM

## 2022-01-11 DIAGNOSIS — Z6841 Body Mass Index (BMI) 40.0 and over, adult: Secondary | ICD-10-CM

## 2022-01-11 DIAGNOSIS — J4521 Mild intermittent asthma with (acute) exacerbation: Secondary | ICD-10-CM | POA: Diagnosis not present

## 2022-01-11 DIAGNOSIS — F17200 Nicotine dependence, unspecified, uncomplicated: Secondary | ICD-10-CM | POA: Diagnosis not present

## 2022-01-11 DIAGNOSIS — I119 Hypertensive heart disease without heart failure: Secondary | ICD-10-CM | POA: Diagnosis not present

## 2022-01-11 DIAGNOSIS — Z23 Encounter for immunization: Secondary | ICD-10-CM

## 2022-01-11 DIAGNOSIS — E669 Obesity, unspecified: Secondary | ICD-10-CM

## 2022-01-11 DIAGNOSIS — Z09 Encounter for follow-up examination after completed treatment for conditions other than malignant neoplasm: Secondary | ICD-10-CM | POA: Diagnosis not present

## 2022-01-11 DIAGNOSIS — E1169 Type 2 diabetes mellitus with other specified complication: Secondary | ICD-10-CM

## 2022-01-11 DIAGNOSIS — R059 Cough, unspecified: Secondary | ICD-10-CM | POA: Diagnosis not present

## 2022-01-11 DIAGNOSIS — Z79899 Other long term (current) drug therapy: Secondary | ICD-10-CM

## 2022-01-11 DIAGNOSIS — I700 Atherosclerosis of aorta: Secondary | ICD-10-CM

## 2022-01-11 DIAGNOSIS — E6609 Other obesity due to excess calories: Secondary | ICD-10-CM

## 2022-01-11 NOTE — Progress Notes (Signed)
Subjective:     Patient ID: Alexandra Bell , female    DOB: [birth year] , 57 y.o.   MRN: [id number]   Chief Complaint  Patient presents with   Hospitalization Follow-up    HPI  Pt presents today for HPFU. For cough, shortness of breath, and aches. Patient reports that she was exposed to family members with upper respiratory illness and then she herself developed cough, general aches, rhinorrhea, and loose stool on 12/09/2021. Discharged on 12/14/2021. She was treated with [facility] to take two times a day. She does not have a refill of Dulera and has completed the dose.  She had not been taking her albuterol often prior to the flu.   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   Heart attack Father    Diabetes Sister      Current Outpatient Medications:    mometasone-formoterol (DULERA) 100-5 MCG/ACT AERO, Inhale 2 puffs into the lungs 2 (two) times daily., Disp: , Rfl:    acetaminophen (TYLENOL) 500 MG tablet, Take 2 tablets (1,000 mg total) by mouth every 6 (six)  hours as needed (For pain)., Disp: , Rfl: 0   albuterol (PROVENTIL) (2.5 MG/3ML) 0.083% nebulizer solution, Inhale 3 mL 3 times a day by nebulization route as needed for 30 days., Disp: , Rfl:    albuterol (VENTOLIN HFA) 108 (90 Base) MCG/ACT inhaler, Inhale 2 puffs into the lungs every 6 (six) hours as needed. For asthma, Disp: 1 each, Rfl: 3   amLODipine (NORVASC) 10 MG tablet, Take 1 tablet by mouth once daily, Disp: 90 tablet, Rfl: 1   apixaban (ELIQUIS) 5 MG TABS tablet, Take 1 tablet by mouth twice daily, Disp: 180 tablet, Rfl: 1   carvedilol (COREG) 25 MG tablet, Take 1 tablet by mouth twice daily, Disp: 180 tablet, Rfl: 1   diclofenac Sodium (VOLTAREN) 1 % GEL, Apply 2 g topically daily as needed (For pain)., Disp: 50 g, Rfl: 1   FARXIGA 10 MG TABS tablet, TAKE 1 TABLET BY MOUTH ONCE DAILY BEFORE BREAKFAST, Disp: 90 tablet, Rfl: 0   ferrous sulfate 325 (65 FE) MG EC tablet, Take 1 tablet (325 mg total) by mouth 2 (two) times daily with a meal., Disp: 180 tablet, Rfl: 2   furosemide (LASIX) 80 MG tablet, Take 1 tablet by mouth once daily, Disp: 90 tablet, Rfl: 2   gabapentin (NEURONTIN) 300 MG capsule,  Take 1 capsule by mouth twice daily, Disp: 180 capsule, Rfl: 0   guaiFENesin-dextromethorphan (ROBITUSSIN DM) 100-10 MG/5ML syrup, Take 5 mLs by mouth every 4 (four) hours as needed for cough., Disp: 118 mL, Rfl: 0   Magnesium 250 MG TABS, Take 250 mg by mouth daily at 2 PM., Disp: , Rfl:    melatonin 5 MG TABS, Take 5 mg by mouth daily at 2 PM., Disp: , Rfl:    meloxicam (MOBIC) 15 MG tablet, Take 15 mg by mouth daily as needed., Disp: , Rfl:    metFORMIN (GLUCOPHAGE) 500 MG tablet, Take 1 tablet (500 mg total) by mouth 2 (two) times daily with a meal., Disp: 180 tablet, Rfl: 1   Multiple Vitamin (MULTIVITAMIN WITH MINERALS) TABS tablet, Take 1 tablet by mouth daily., Disp: , Rfl:    nicotine (NICODERM CQ - DOSED IN MG/24 HOURS) 14 mg/24hr patch, Place 1 patch (14 mg total) onto the skin  daily., Disp: 28 patch, Rfl: 0   pantoprazole (PROTONIX) 40 MG tablet, Take 1 tablet (40 mg total) by mouth 2 (two) times daily., Disp: 180 tablet, Rfl: 2   potassium chloride SA (KLOR-CON M) 20 MEQ tablet, Take 1 tablet (20 mEq total) by mouth daily., Disp: 90 tablet, Rfl: 2   sacubitril-valsartan (ENTRESTO) 24-26 MG, Take 1 tablet by mouth 2 (two) times daily., Disp: 60 tablet, Rfl: 5   No Known Allergies   Review of Systems  Constitutional: Negative.   HENT: Negative.    Eyes: Negative.   Respiratory: Negative.    Cardiovascular: Negative.   Neurological: Negative.   Psychiatric/Behavioral: Negative.       Today's Vitals   01/11/22 1506  BP: 138/70  Pulse: 79  Temp: 99 F (37.2 C)  TempSrc: Oral  SpO2: 92%  Weight: 248 lb (112.5 kg)  Height: _0  (1.651 m)   Body mass index is 41.27 kg/m.   Objective:  Physical Exam Vitals reviewed.  Constitutional:      General: She is not in acute distress.    Appearance: Normal appearance. She is well-developed. She is obese.  Cardiovascular:     Rate and Rhythm: Normal rate and regular rhythm.     Pulses: Normal pulses.     Heart sounds: Normal heart sounds. No murmur heard. Pulmonary:     Effort: Pulmonary effort is normal. No respiratory distress.     Breath sounds: Normal breath sounds. No wheezing.  Musculoskeletal:        General: No tenderness. Normal range of motion.     Comments: Using cane for ambulation  Skin:    General: Skin is warm and dry.     Capillary Refill: Capillary refill takes less than 2 seconds.  Neurological:     General: No focal deficit present.     Mental Status: She is alert and oriented to person, place, and time.     Cranial Nerves: No cranial nerve deficit.     Motor: No weakness.  Psychiatric:        Mood and Affect: Mood normal.        Behavior: Behavior normal.        Thought Content: Thought content normal.        Judgment: Judgment normal.         Assessment And Plan:     1.  Hypertensive heart disease without heart failure Comments: Blood pressure is stable, continue current medications and follow-up with cardiology  2. Diabetes mellitus type 2 in obese (  [place]) Comments: Well controlled, continue [place].  3. History of influenza Comments: Was admitted to the hospital more than 14 days ago for influenza A.  She is feeling much better at this time - DG Chest 2 View; Future  4. Mild intermittent asthma with acute exacerbation Comments: She is to go for a chest x-ray to evaluate for previous bilateral opacity  5. Tobacco use disorder  6. Atherosclerosis of aorta (HCC) Comments: Discussed this finding and risk for stroke and heart attack.  She is willing to start a statin For prevention.  Discussed side effects - CMP14+EGFR; Future  7. Class 3 severe obesity due to excess calories with serious comorbidity and body mass index (BMI) of 40.0 to 44.9 in adult [facility]) Comments: Discussed importance of exercise and regularly as tolerated and eating a healthy diet low in fat and sugars  8. Other long term (current) drug therapy - CMP14+EGFR; Future  9. Hospital discharge follow-up TCM Performed. A member of the clinical team spoke with the patient upon dischare. Discharge summary was reviewed in full detail during the visit. Meds reconciled and compared to discharge meds. Medication list is updated and reviewed with the patient.  Greater than 50% face to face time was spent in counseling an coordination of care.  All questions were answered to the satisfaction of the patient.       Patient was given opportunity to ask questions. Patient verbalized understanding of the plan and was able to repeat key elements of the plan. All questions were answered to their satisfaction.  [name], FNP   I, [name], FNP, have reviewed all documentation for this visit. The documentation on 01/12/22 for the exam, diagnosis, procedures, and orders are all accurate and complete.    IF YOU HAVE BEEN REFERRED TO A SPECIALIST, IT MAY TAKE 1-2 WEEKS TO SCHEDULE/PROCESS THE REFERRAL. IF YOU HAVE NOT HEARD FROM US/SPECIALIST IN TWO WEEKS, PLEASE GIVE [place] A CALL AT [phone].   THE PATIENT IS ENCOURAGED TO PRACTICE SOCIAL DISTANCING DUE TO THE COVID-19 PANDEMIC.

## 2022-01-11 NOTE — Patient Instructions (Signed)
Atherosclerosis  Atherosclerosis is when plaque builds up in the arteries. This causes narrowing and hardening of the arteries. Arteries are blood vessels that carry blood from the heart to all parts of the body. This blood contains oxygen. Plaque occurs due to inflammation or from a buildup of fat, cholesterol, calcium, waste products of cells, and a clotting material in the blood (fibrin). Plaque decreases the amount of blood that can flow through the artery. Atherosclerosis can affect any artery in your body, including: Heart arteries. Damage to these arteries may lead to coronary artery disease, which can cause a heart attack. Brain arteries. Damage to these arteries may cause a stroke. Leg, arm, and pelvis arteries. Peripheral artery disease (PAD) may result from damage to these arteries. Kidney arteries. Kidney (renal) failure may result from damage to kidney arteries. Treatment may slow the disease and prevent further damage to your heart, brain, peripheral arteries, and kidneys. What are the causes? This condition develops slowly over many years. The inner layers of your arteries become damaged and allow the gradual buildup of plaque. The exact cause of atherosclerosis is not fully understood. Symptoms of atherosclerosis do not occur until an artery becomes narrow or blocked. What increases the risk? The following factors may make you more likely to develop this condition: Being middle-aged or older. Certain medical conditions, including: High blood pressure. High cholesterol. High blood fats (triglycerides). Diabetes. Sleep apnea. Obesity. Certain lab levels, including: Elevated C-reactive protein (CRP). This is a sign of increased inflammation in your body. Elevated homocysteine levels. This is an amino acid that is associated with heart and blood vessel disease. Using tobacco or nicotine products. A family history of atherosclerosis. Not exercising enough (sedentary  lifestyle). Being stressed. Drinking too much alcohol or using drugs, such as cocaine or methamphetamine. What are the signs or symptoms? Symptoms of atherosclerosis do not occur until the plaque severely narrows or blocks the artery, which decreases blood flow. Sometimes, atherosclerosis does not cause symptoms. Symptoms of this condition include: Coronary artery disease. This may cause chest pain and shortness of breath. Decreased blood supply to your brain, which may cause a stroke. Signs of a stroke may include sudden: Weakness or numbness in your face, arm, or leg, especially on one side of your body. Trouble walking or difficulty moving your arms or legs. Loss of balance or coordination. Confusion. Slurred speech. Trouble speaking, or trouble understanding speech, or both (aphasia). Vision changes in one or both eyes. This may be double vision, blurred vision, or loss of vision. Severe headache with no known cause. The headache is often described as the worst headache ever experienced. PAD, which may cause pain, numbness, or nonhealing wounds, often in your legs and hips. Renal failure. This may cause tiredness, problems with urination, swelling, and itchy skin. How is this diagnosed? This condition is diagnosed based on your medical history and a physical exam. During the exam, your health care provider will: Check your pulse in different places. Listen for a "whooshing" sound over your arteries (bruit). You may also have tests, such as: Blood tests to check your levels of cholesterol, triglycerides, blood sugar, and CRP. Ankle-brachial index to compare blood pressure in your arms to blood pressure in your ankles to see how your blood is flowing. Heart (cardiac) tests. Electrocardiogram (ECG) to check for heart damage. Stress test to see how your heart reacts to exercise. Ultrasound tests. Ultrasound of your peripheral arteries to check blood flow. Echocardiogram to get images of  your heart's  chambers and valves. X-ray tests. Chest X-ray to see if you have an enlarged heart, which is a sign of heart failure. CT scan to check for damage to your heart, brain, or arteries. Angiogram. This is a test where dye is injected and X-rays are used to see the blood flow in the arteries. How is this treated? This condition is treated with lifestyle changes as the first step. These may include: Changing your diet. Losing weight. Reducing stress. Exercising and being physically active more regularly. Quitting smoking. You may also need medicine to: Lower triglycerides and cholesterol. Control blood pressure. Prevent blood clots. Lower inflammation in your body. Control your blood sugar. Sometimes, surgery is needed to: Remove plaque from an artery (endarterectomy). Open or widen a narrowed heart artery or peripheral artery (angioplasty). Create a new path for your blood with one of these procedures: Heart (coronary) artery bypass graft surgery. Peripheral artery bypass graft surgery. Place a small mesh tube (stent) in an artery to open or widen a narrowed artery. Follow these instructions at home: Eating and drinking  Eat a heart-healthy diet. Talk with your health care provider or a dietitian if you need help. A heart-healthy diet involves: Limiting unhealthy fats and increasing healthy fats. Some examples of healthy fats are avocados and olive oil. Eating plant-based foods, such as fruits, vegetables, nuts, whole grains, and legumes (such as peas and lentils). If you drink alcohol: Limit how much you have to: 0-1 drink a day for women who are not pregnant. 0-2 drinks a day for men. Know how much alcohol is in a drink. In the [place] one drink equals one 12 oz bottle of beer (355 mL), one 5 oz glass of wine (148 mL), or one 1 oz glass of hard liquor (44 mL). Lifestyle  Maintain a healthy weight. Lose weight if your health care provider says that you need to do  that. Follow an exercise program as told by your health care provider. Do not use any products that contain nicotine or tobacco. These products include cigarettes, chewing tobacco, and vaping devices, such as e-cigarettes. If you need help quitting, ask your health care provider. Do not use drugs. General instructions Take over-the-counter and prescription medicines only as told by your health care provider. Manage other health conditions as told. Keep all follow-up visits. This is important. Contact a health care provider if you have: An irregular heartbeat. Unexplained tiredness (fatigue). Trouble urinating, or you are producing less urine or foamy urine. Swelling of your hands or feet, or itchy skin. Unexplained pain or numbness in your legs or hips. A wound that is slow to heal or is not healing. Get help right away if: You have any symptoms of a heart attack. These may be: Chest pain. This includes squeezing chest pain that may feel like indigestion (angina). Shortness of breath. Pain in your neck, jaw, arms, back, or stomach. Cold sweat. Nausea. Light-headedness. Sudden pain, numbness, or coldness in a limb. You have any symptoms of a stroke. "BE FAST" is an easy way to remember the main warning signs of a stroke: B - Balance. Signs are dizziness, sudden trouble walking, or loss of balance. E - Eyes. Signs are trouble seeing or a sudden change in vision. F - Face. Signs are sudden weakness or numbness of the face, or the face or eyelid drooping on one side. A - Arms. Signs are weakness or numbness in an arm. This happens suddenly and usually on one side of the body. S -  Speech. Signs are sudden trouble speaking, slurred speech, or trouble understanding what people say. T - Time. Time to call emergency services. Write down what time symptoms started. You have other signs of a stroke, such as: A sudden, severe headache with no known cause. Nausea or vomiting. Seizure. These  symptoms may represent a serious problem that is an emergency. Do not wait to see if the symptoms will go away. Get medical help right away. Call your local emergency services (911 in the [place] Do not drive yourself to the hospital. Summary Atherosclerosis is when plaque builds up in the arteries and causes narrowing and hardening of the arteries. Plaque occurs due to inflammation or from a buildup of fat, cholesterol, calcium, cellular waste products, and fibrin. This condition may not cause any symptoms. Symptoms of atherosclerosis do not occur until the plaque severely narrows or blocks the artery. Treatment starts with lifestyle changes and may include medicines. In some cases, surgery is needed. Get help right away if you have any symptoms of a heart attack or stroke. This information is not intended to replace advice given to you by your health care provider. Make sure you discuss any questions you have with your health care provider. Document Revised: 03/30/2020 Document Reviewed: 03/30/2020 Elsevier Patient Education  [place].

## 2022-01-12 MED ORDER — AZITHROMYCIN 250 MG PO TABS: ORAL_TABLET | ORAL | 0 refills | Status: AC

## 2022-01-18 NOTE — Progress Notes (Signed)
Remote ICD transmission.   

## 2022-02-04 ENCOUNTER — Ambulatory Visit (HOSPITAL_COMMUNITY)
Admission: EM | Admit: 2022-02-04 | Discharge: 2022-02-04 | Disposition: A | Discharge status: Home / Self Care | Payer: Medicaid Other | Attending: Emergency Medicine | Admitting: Emergency Medicine

## 2022-02-04 DIAGNOSIS — J4521 Mild intermittent asthma with (acute) exacerbation: Secondary | ICD-10-CM | POA: Diagnosis not present

## 2022-02-04 MED ORDER — PREDNISONE 20 MG PO TABS: 40.0000 mg | ORAL_TABLET | Freq: Every day | ORAL | 0 refills | Status: AC

## 2022-02-04 MED ORDER — IPRATROPIUM-ALBUTEROL 0.5-2.5 (3) MG/3ML IN SOLN
RESPIRATORY_TRACT | Status: AC
  Filled 2022-02-04: qty 3

## 2022-02-04 MED ORDER — IPRATROPIUM-ALBUTEROL 0.5-2.5 (3) MG/3ML IN SOLN
3.0000 mL | Freq: Once | RESPIRATORY_TRACT | Status: AC
  Administered 2022-02-04: 3 mL via RESPIRATORY_TRACT

## 2022-02-04 NOTE — Discharge Instructions (Addendum)
Start the prednisone tomorrow. Once daily for 5 days.  Use your nebulizer every 6 hours (3x daily) for the next 3-4 days  Please monitor for worsening symptoms. If you develop shortness of breath, or have wheezing that does not respond to breathing treatments, please go directly to the emergency department.

## 2022-02-04 NOTE — ED Triage Notes (Signed)
Pt is here for cough,fatigue,x awile

## 2022-02-04 NOTE — ED Provider Notes (Signed)
[place]    CSN: [id number] Arrival date & time: 02/04/22  1436     History   Chief Complaint Chief Complaint  Patient presents with   Cough    HPI Alexandra Bell is a 57 y.o. female.  Presents with 2-3 day history of cough Reports developing shortness of breath over the last day or so Had some wheezing at home She has been using nebulizer at home No fevers  Hx asthma, CHF/HCM Current smoker  She was hospitalized last month for hypoxic resp failure Had xray 1/5 and treated for possible infection. 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   Fatty liver 11/2010   Gastritis    GERD (gastroesophageal reflux disease)    HOCM (hypertrophic obstructive cardiomyopathy) ([place]) 12/10/2019   Hx: UTI (urinary tract infection)    Hypertension    Inguinal hernia    Morbid obesity ([place]) 12/10/2019   NSVT (nonsustained ventricular tachycardia) ([place]) 03/10/2020   Obesity, Class III, BMI 40-49.9 (morbid obesity) ([place])    Periumbilical hernia    Upper GI bleed 09/11/2020    Patient Active Problem List   Diagnosis Date Noted   Pain in both knees 03/20/2021   Gastritis and gastroduodenitis    Symptomatic anemia 10/04/2020   Duodenal ulcer    Chronic anticoagulation    AF (paroxysmal atrial fibrillation) ([place]) 09/11/2020   Tobacco use disorder 09/11/2020   Type 2 diabetes mellitus ([place]) 09/11/2020   Diabetic neuropathy ([place]) 09/11/2020   NSVT (nonsustained ventricular tachycardia) ([place]) 03/10/2020   HOCM (hypertrophic obstructive cardiomyopathy) ([place]) 12/10/2019   Eosinophilia 01/16/2011   Colitis,  eosinophilic 94/70/9628   Leukocytosis 11/28/2010   Essential hypertension 05/18/2009   Acute asthma exacerbation 05/18/2009   Nonspecific (abnormal) findings on radiological and other examination of body structure 05/18/2009   COMPUTERIZED TOMOGRAPHY, CHEST, ABNORMAL 05/18/2009    Past Surgical History:  Procedure Laterality Date   ABDOMINAL HYSTERECTOMY     BIOPSY  09/13/2020   Procedure: BIOPSY;  Surgeon: Jackquline Denmark, MD;  Location: [facility] ENDOSCOPY;  Service: Endoscopy;;   BIOPSY  10/05/2020   Procedure: BIOPSY;  Surgeon: Jerene Bears, MD;  Location: [facility] ENDOSCOPY;  Service: Gastroenterology;;   BRONCHOSCOPY, LYMPH NODE BX  MAY 2011   CHOLECYSTECTOMY  2001   COLONOSCOPY N/A 09/13/2020   Procedure: COLONOSCOPY;  Surgeon: Jackquline Denmark, MD;  Location: [facility] ENDOSCOPY;  Service: Endoscopy;  Laterality: N/A;   ERCP/SPHINCTEROTOMY/STONE EXTRACTION  2001   ESOPHAGOGASTRODUODENOSCOPY (EGD) WITH PROPOFOL N/A 09/13/2020   Procedure: ESOPHAGOGASTRODUODENOSCOPY (EGD) WITH PROPOFOL;  Surgeon: Jackquline Denmark, MD;  Location: [facility] ENDOSCOPY;  Service: Endoscopy;  Laterality: N/A;   ESOPHAGOGASTRODUODENOSCOPY (EGD) WITH PROPOFOL N/A 10/05/2020   Procedure: ESOPHAGOGASTRODUODENOSCOPY (EGD) WITH PROPOFOL;  Surgeon: Jerene Bears, MD;  Location: [facility] ENDOSCOPY;  Service: Gastroenterology;  Laterality: N/A;   FLEXIBLE SIGMOIDOSCOPY  11/29/2010   Procedure: FLEXIBLE SIGMOIDOSCOPY;  Surgeon: Lafayette Dragon, MD;  Location: [facility] ENDOSCOPY;  Service: Endoscopy;  Laterality: N/A;   HEMOSTASIS CLIP PLACEMENT  10/05/2020   Procedure: HEMOSTASIS CLIP PLACEMENT;  Surgeon: Jerene Bears, MD;  Location: [facility] ENDOSCOPY;  Service: Gastroenterology;;   ICD IMPLANT N/A 03/25/2020   Procedure: ICD IMPLANT;  Surgeon: Vickie Epley, MD;  Location: [facility] CV LAB;  Service: Cardiovascular;  Laterality: N/A;    OB History   No obstetric history on file.      Home Medications    Prior to Admission medications   Medication Sig  Start Date End Date Taking? Authorizing Provider  albuterol (PROVENTIL) (2.5 MG/3ML) 0.083% nebulizer solution Inhale 3 mL 3 times a day by nebulization route as needed for 30 days. 03/04/17  Yes [provider]  albuterol (VENTOLIN HFA) 108 (90 Base) MCG/ACT inhaler Inhale 2 puffs into the lungs every 6 (six) hours as needed. For asthma 09/07/20  Yes [name], FNP  amLODipine (NORVASC) 10 MG tablet Take 1 tablet by mouth once daily 11/01/21  Yes Skeet Latch, MD  apixaban Arne [place]) 5 MG TABS tablet Take 1 tablet by mouth twice daily 09/07/21  Yes Skeet Latch, MD  carvedilol (COREG) 25 MG tablet Take 1 tablet by mouth twice daily 01/09/22  Yes Skeet Latch, MD  diclofenac Sodium (VOLTAREN) 1 % GEL Apply 2 g topically daily as needed (For pain). 01/06/21  Yes [name], FNP  FARXIGA 10 MG TABS tablet TAKE 1 TABLET BY MOUTH ONCE DAILY BEFORE BREAKFAST 11/14/21  Yes [name], FNP  furosemide (LASIX) 80 MG tablet Take 1 tablet by mouth once daily 05/15/21  Yes Skeet Latch, MD  gabapentin (NEURONTIN) 300 MG capsule Take 1 capsule by mouth twice daily 11/16/21  Yes [name], FNP  guaiFENesin-dextromethorphan (ROBITUSSIN DM) 100-10 MG/5ML syrup Take 5 mLs by mouth every 4 (four) hours as needed for cough. 12/14/21  Yes Gherghe, Vella Redhead, MD  Magnesium 250 MG TABS Take 250 mg by mouth daily at 2 PM.   Yes [provider]  melatonin 5 MG TABS Take 5 mg by mouth daily at 2 PM.   Yes [provider]  meloxicam (MOBIC) 15 MG tablet Take 15 mg by mouth daily as needed. 12/07/21  Yes [provider]  metFORMIN (GLUCOPHAGE) 500 MG tablet Take 1 tablet (500 mg total) by mouth 2 (two) times daily with a meal. 08/23/21  Yes [name], FNP  mometasone-formoterol (DULERA) 100-5 MCG/ACT AERO Inhale 2 puffs into the lungs 2 (two) times daily.   Yes [provider]  Multiple Vitamin (MULTIVITAMIN WITH MINERALS) TABS tablet Take 1 tablet by mouth  daily.   Yes [provider]  nicotine (NICODERM CQ - DOSED IN MG/24 HOURS) 14 mg/24hr patch Place 1 patch (14 mg total) onto the skin daily. 12/14/21 12/14/22 Yes Gherghe, Vella Redhead, MD  pantoprazole (PROTONIX) 40 MG tablet Take 1 tablet (40 mg total) by mouth 2 (two) times daily. 08/10/21  Yes [name], FNP  potassium chloride SA (KLOR-CON M) 20 MEQ tablet Take 1 tablet (20 mEq total) by mouth daily. 05/02/21  Yes [name], FNP  predniSONE (DELTASONE) 20 MG tablet Take 2 tablets (40 mg total) by mouth daily with breakfast for 5 days. 02/04/22 02/09/22 Yes Kyleen Villatoro, Wells Guiles, PA-C  sacubitril-valsartan (ENTRESTO) 24-26 MG Take 1 tablet by mouth 2 (two) times daily. 10/05/21  Yes Skeet Latch, MD  ferrous sulfate 325 (65 FE) MG EC tablet Take 1 tablet (325 mg total) by mouth 2 (two) times daily with a meal. 11/03/20 12/11/21  [name], FNP    Family History Family History  Problem Relation Age of Onset   Diabetes Mother    Colon cancer Father        ?  Heart attack Father    Diabetes Sister     Social History Social History   Tobacco Use   Smoking status: Every Day    Packs/day: 0.25    Years: 30.00    Total pack years: 7.50    Types: Cigarettes   Smokeless tobacco: Never   Tobacco comments:    she is less than a PPD - states down to 6 cigarettes a day - 10/14, reports smoking 1 PPD for 10 years; she has cut down to 3 a day  Vaping Use   Vaping Use: Never used  Substance Use Topics   Alcohol use: No   Drug use: No     Allergies   Patient has no known allergies.   Review of Systems Review of Systems As per HPI  Physical Exam Triage Vital Signs ED Triage Vitals [02/04/22 1650]  Enc Vitals Group     BP      Pulse      Resp      Temp      Temp src      SpO2      Weight      Height      Head Circumference      Peak Flow      Pain Score 8     Pain Loc      Pain Edu?      Excl. in [place]?    No data found.  Updated Vital Signs BP 122/73 (BP  Location: Right Arm)   Pulse 71   Temp 98.3 F (36.8 C) (Oral)   Resp (!) 22   SpO2 94%   Physical Exam Vitals and nursing note reviewed.  Constitutional:      General: She is not in acute distress.    Comments: Can speak in full sentences   HENT:     Nose: No congestion or rhinorrhea.     Mouth/Throat:     Mouth: Mucous membranes are moist.     Pharynx: Oropharynx is clear.  Cardiovascular:     Rate and Rhythm: Normal rate and regular rhythm.     Pulses: Normal pulses.     Heart sounds: Normal heart sounds.  Pulmonary:     Effort: Pulmonary effort is normal. No respiratory distress.     Breath sounds: Wheezing present.     Comments: Normal work of breathing. Insp and exp faint wheezes throughout  Neurological:     Mental Status: She is alert and oriented to person, place, and time.     UC Treatments / Results  Labs (all labs ordered are listed, but only abnormal results are displayed) Labs Reviewed - No data to display  EKG   Radiology No results found.  Procedures Procedures   Medications Ordered in UC Medications  ipratropium-albuterol (DUONEB) 0.5-2.5 (3) MG/3ML nebulizer solution 3 mL (3 mLs Nebulization Given 02/04/22 1711)    Initial Impression / Assessment and Plan / UC Course  I have reviewed the triage vital signs and the nursing notes.  Pertinent labs & imaging results that were available during my care of the patient were reviewed by me and considered in my medical decision making (see chart for details).  On arrival SpO2 is ~88% There was some discrepancy between two different sensors - one reporting 93% and the other 87% room air Both are working correctly when placed on this provider  DuoNeb given with mild improvement.  She does not appear short of breath and is not coughing in  clinic  Placed on [place] 3L, up to 97% Room air trial reveals O2 sitting around 92-93% This is much improved from pre-oxygen  Suspect acute asthma  exacerbation Considered hospitalization due to oxygen requirement. However patient is well appearing with stable vitals at this time, and sating well on room air. Up to 95% room air at time of discharge   Neb treatments every 6 hours for the next 3-4 days Prednisone 40 mg daily x 5 days Hx DM; last A1c was 5.4  Discussed strict ED precautions for any worsening of symptoms. Patient is agreeable to plan  Final Clinical Impressions(s) / UC Diagnoses   Final diagnoses:  Mild intermittent asthma with acute exacerbation     Discharge Instructions      Start the prednisone tomorrow. Once daily for 5 days.  Use your nebulizer every 6 hours (3x daily) for the next 3-4 days  Please monitor for worsening symptoms. If you develop shortness of breath, or have wheezing that does not respond to breathing treatments, please go directly to the emergency department.      ED Prescriptions     Medication Sig Dispense Auth. Provider   predniSONE (DELTASONE) 20 MG tablet Take 2 tablets (40 mg total) by mouth daily with breakfast for 5 days. 10 tablet Trellis Vanoverbeke, Wells Guiles, PA-C      PDMP not reviewed this encounter.   Kyra Leyland 02/04/22 1836

## 2022-02-09 ENCOUNTER — Other Ambulatory Visit: Payer: Self-pay | Admitting: Nurse Practitioner

## 2022-02-09 DIAGNOSIS — M79642 Pain in left hand: Secondary | ICD-10-CM

## 2022-02-09 DIAGNOSIS — M545 Low back pain, unspecified: Secondary | ICD-10-CM

## 2022-02-21 ENCOUNTER — Other Ambulatory Visit: Payer: Self-pay

## 2022-02-21 DIAGNOSIS — Z8701 Personal history of pneumonia (recurrent): Secondary | ICD-10-CM

## 2022-02-22 ENCOUNTER — Other Ambulatory Visit: Payer: Commercial Managed Care - HMO

## 2022-02-22 ENCOUNTER — Ambulatory Visit
Admission: RE | Admit: 2022-02-22 | Discharge: 2022-02-22 | Disposition: A | Discharge status: Home / Self Care | Payer: Medicaid Other | Source: Ambulatory Visit | Attending: Nurse Practitioner | Admitting: Nurse Practitioner

## 2022-02-22 DIAGNOSIS — I700 Atherosclerosis of aorta: Secondary | ICD-10-CM

## 2022-02-22 DIAGNOSIS — Z79899 Other long term (current) drug therapy: Secondary | ICD-10-CM | POA: Diagnosis not present

## 2022-02-22 DIAGNOSIS — Z8701 Personal history of pneumonia (recurrent): Secondary | ICD-10-CM

## 2022-02-23 LAB — CMP14+EGFR
ALT: 7 IU/L (ref 0–32)
AST: 10 IU/L (ref 0–40)
Albumin/Globulin Ratio: 1.8 (ref 1.2–2.2)
Albumin: 4.2 g/dL (ref 3.8–4.9)
Alkaline Phosphatase: 71 IU/L (ref 44–121)
BUN/Creatinine Ratio: 17 (ref 9–23)
BUN: 12 mg/dL (ref 6–24)
Bilirubin Total: <0.2 mg/dL (ref 0.0–1.2)
CO2: 23 mmol/L (ref 20–29)
Calcium: 10.4 mg/dL — ABNORMAL HIGH (ref 8.7–10.2)
Chloride: 104 mmol/L (ref 96–106)
Creatinine, Ser: 0.72 mg/dL (ref 0.57–1.00)
Globulin, Total: 2.3 g/dL (ref 1.5–4.5)
Glucose: 76 mg/dL (ref 70–99)
Potassium: 4.5 mmol/L (ref 3.5–5.2)
Sodium: 143 mmol/L (ref 134–144)
Total Protein: 6.5 g/dL (ref 6.0–8.5)
eGFR: 97 mL/min/{1.73_m2} (ref >59)

## 2022-02-28 ENCOUNTER — Other Ambulatory Visit: Payer: Self-pay | Admitting: Nurse Practitioner

## 2022-02-28 ENCOUNTER — Other Ambulatory Visit: Payer: Self-pay | Admitting: Cardiovascular Disease

## 2022-03-01 NOTE — Telephone Encounter (Signed)
Rx request sent to pharmacy.  

## 2022-03-02 ENCOUNTER — Telehealth: Payer: Self-pay

## 2022-03-02 ENCOUNTER — Other Ambulatory Visit: Payer: Self-pay

## 2022-03-02 MED ORDER — DAPAGLIFLOZIN PROPANEDIOL 10 MG PO TABS: 10.0000 mg | ORAL_TABLET | Freq: Every day | ORAL | 2 refills | Status: DC

## 2022-03-02 NOTE — Telephone Encounter (Signed)
Per pharmacy [facility] requires PA  PA started via [facility] Key: [facility]

## 2022-03-03 ENCOUNTER — Other Ambulatory Visit: Payer: Self-pay | Admitting: Nurse Practitioner

## 2022-03-03 ENCOUNTER — Other Ambulatory Visit: Payer: Self-pay | Admitting: Cardiovascular Disease

## 2022-03-05 ENCOUNTER — Telehealth: Payer: Self-pay

## 2022-03-05 NOTE — Telephone Encounter (Signed)
Error

## 2022-03-05 NOTE — Telephone Encounter (Signed)
Per [employer] patient has alternative primary pharmacy coverage. Spoke with [facility] and they do not show any other insurance/pharmacy coverage for patient.   LVM for patient to inquire about other coverage

## 2022-03-07 NOTE — Telephone Encounter (Signed)
Spoke with patient and she states she has already spoken with [employer] and let them know she does not have any other insurance coverage. She will call them again to try and sort it out, she will call us back once this has been completed.

## 2022-03-07 NOTE — Telephone Encounter (Signed)
Patient returned call and advised she has spoken with [employer] and they state they have corrected the problem. She states they recommend resubmitting the PA after 24 hours.   PA will be resubmitted tomorrow. Patient aware.

## 2022-03-13 NOTE — Telephone Encounter (Signed)
New PA attempted via CMM:  [place] '10MG'$  tablets Not Required  Per CMM PA is not required for [place].

## 2022-03-14 ENCOUNTER — Other Ambulatory Visit: Payer: Self-pay | Admitting: Cardiovascular Disease

## 2022-03-14 ENCOUNTER — Other Ambulatory Visit: Payer: Self-pay | Admitting: Nurse Practitioner

## 2022-03-14 DIAGNOSIS — R7309 Other abnormal glucose: Secondary | ICD-10-CM

## 2022-03-14 DIAGNOSIS — I480 Paroxysmal atrial fibrillation: Secondary | ICD-10-CM

## 2022-03-14 NOTE — Telephone Encounter (Signed)
Eliquis '5mg'$  refill request received. Patient is 57 years old, weight-112.5kg, Crea-0.72 on 02/22/22, Diagnosis-Afib, and last seen by Dr. Oval Linsey on 09/07/21. Dose is appropriate based on dosing criteria. Will send in refill to requested pharmacy.

## 2022-03-15 NOTE — Telephone Encounter (Signed)
Received fax from [place] that [facility] is covered 90/90 from 03/14/22-03/14/23.

## 2022-03-21 ENCOUNTER — Telehealth: Payer: Self-pay | Admitting: Cardiovascular Disease

## 2022-03-21 DIAGNOSIS — I421 Obstructive hypertrophic cardiomyopathy: Secondary | ICD-10-CM

## 2022-03-21 DIAGNOSIS — I10 Essential (primary) hypertension: Secondary | ICD-10-CM

## 2022-03-21 NOTE — Telephone Encounter (Signed)
*  STAT* If patient is at the pharmacy, call can be transferred to refill team.   1. Which medications need to be refilled? (please list name of each medication and dose if known)   sacubitril-valsartan (ENTRESTO) 24-26 MG    2. Which pharmacy/location (including street and city if local pharmacy) is medication to be sent to?   [place] ([place] - [address]      3. Do they need a 30 day or 90 day supply? [employer]

## 2022-03-22 MED ORDER — SACUBITRIL-VALSARTAN 24-26 MG PO TABS: 1.0000 | ORAL_TABLET | Freq: Two times a day (BID) | ORAL | 1 refills | Status: DC

## 2022-03-22 NOTE — Telephone Encounter (Signed)
Rx request sent to pharmacy.  

## 2022-03-22 NOTE — Telephone Encounter (Signed)
Patient needs PA for [employer] (Key: [place]) [phone] Need help? Call us at [phone] Status Shared Drug Entresto 24-'26MG'$  tablets  Will forward to prior [facility] department

## 2022-03-23 ENCOUNTER — Telehealth: Payer: Self-pay

## 2022-03-23 NOTE — Telephone Encounter (Signed)
Pharmacy Patient Advocate Encounter   Received notification from RN tablets  that prior authorization for Entresto 24-26MG  tablets  is required/requested.     PA submitted on 3.15.24 to (ins) [place] via [employer] or [facility]) confirmation # [id number] Status is pending

## 2022-03-26 ENCOUNTER — Other Ambulatory Visit (HOSPITAL_COMMUNITY): Payer: Self-pay

## 2022-03-26 NOTE — Telephone Encounter (Signed)
Pharmacy Patient Advocate Encounter  Prior Authorization for Entresto 24-26MG  tablets  has been Approved by [employer] (ins).    PA# (p/a initiated with plan verbally. Please see approv al document in Media  Effective dates: 3.18.24 through 3.18.25

## 2022-03-27 NOTE — Telephone Encounter (Signed)
Left detailed message, ok per DPR Message left at [facility] PA obtained

## 2022-03-29 ENCOUNTER — Ambulatory Visit (HOSPITAL_BASED_OUTPATIENT_CLINIC_OR_DEPARTMENT_OTHER): Payer: Medicaid Other | Admitting: Cardiovascular Disease

## 2022-03-29 ENCOUNTER — Encounter (HOSPITAL_BASED_OUTPATIENT_CLINIC_OR_DEPARTMENT_OTHER): Payer: Self-pay | Admitting: Cardiovascular Disease

## 2022-03-29 ENCOUNTER — Ambulatory Visit (INDEPENDENT_AMBULATORY_CARE_PROVIDER_SITE_OTHER): Payer: Medicaid Other

## 2022-03-29 VITALS — BP 138/64 | HR 77 | Ht 65.0 in | Wt 256.1 lb

## 2022-03-29 DIAGNOSIS — I421 Obstructive hypertrophic cardiomyopathy: Secondary | ICD-10-CM

## 2022-03-29 DIAGNOSIS — I10 Essential (primary) hypertension: Secondary | ICD-10-CM

## 2022-03-29 LAB — CUP PACEART REMOTE DEVICE CHECK
Battery Remaining Longevity: 144 mo
Battery Remaining Percentage: 100 %
Brady Statistic RA Percent Paced: 7 %
Brady Statistic RV Percent Paced: 0 %
Date Time Interrogation Session: 20240320005100
HighPow Impedance: 79 Ohm
Implantable Lead Connection Status: 753985
Implantable Lead Connection Status: 753985
Implantable Lead Implant Date: 20220318
Implantable Lead Implant Date: 20220318
Implantable Lead Location: 753859
Implantable Lead Location: 753860
Implantable Lead Model: 673
Implantable Lead Model: 7841
Implantable Lead Serial Number: 1122351
Implantable Lead Serial Number: 158410
Implantable Pulse Generator Implant Date: 20220318
Lead Channel Impedance Value: 383 Ohm
Lead Channel Impedance Value: 620 Ohm
Lead Channel Setting Pacing Amplitude: 2 V
Lead Channel Setting Pacing Amplitude: 2.5 V
Lead Channel Setting Pacing Pulse Width: 0.4 ms
Lead Channel Setting Sensing Sensitivity: 0.5 mV
Pulse Gen Serial Number: 593724
Zone Setting Status: 755011

## 2022-03-29 MED ORDER — ENTRESTO 24-26 MG PO TABS: 1.0000 | ORAL_TABLET | Freq: Two times a day (BID) | ORAL | 0 refills | Status: DC

## 2022-03-29 NOTE — Progress Notes (Signed)
Cardiology Office Note   Date:  03/29/2022   ID:  Alexandra Bell, DOB 11-Apr-1965, MRN [id number]  PCP:  [name], FNP  Cardiologist:   Skeet Latch, MD  EP: Dr. Quentin Ore  No chief complaint on file.  History of Present Illness: Alexandra Bell is a 57 y.o. female with HOCM status post ICD, paroxysmal atrial fibrillation, hypertension, asthma, GERD,  and obesity here for follow up.  She saw her PCP and had LE edema and was noted to have a heart murmur on exam.  She reports that she has had a murmur for many years.  BNP at that time was 451.  She had an echo 11/2019 that revealed LVEF 65 to 70% with severe LVH and grade 2 diastolic dysfunction.  She was referred for cardiac MRI which revealed LVEF 56% with extensive mid wall late gadolinium enhancement in the basal to mid anteroseptum, mid to apical anterior, and mid to apical inferolateral myocardium.  She wore an ambulatory monitor that did reveal episodes of NSVT and PAF.  She was referred to EP for consideration of an ICD which she had implanted on 03/2020.  When she saw her PCP in January BNP was elevated to 893.  She was started on Farxiga.   03/10/2020 her blood pressure was uncontrolled.  Metoprolol switched to carvedilol and amlodipine was increased.  Furosemide was also increased.  She followed up with Coletta Memos 04/2020 and was doing well.  Her blood pressure was high in the office but she had been unable to refill her carvedilol at the pharmacy. On 08/2021 entresto was added, she was working on reducing her smoking. ECHO 09/2021 revealed LVEF 70-75% and grade 1 diastolic dysfunction. Mid cavitary gradient was 4 m/s.  Today, the patient states that she has been doing well. She has been getting some exercise walking around the house and yard work she only feels limited by arthritis in her knees. She has been unable to quit smoking, she smokes 4 cigarettes a day. Her blood pressure at home has been between 130-140 which mirrors  the initial reading in clinic today of 138/68, rechecked at 138/64. She usually cooks at home and is trying to cut down on salt intake. Notably she has been out of Entresto for a week and hasn't been taking it.  She denies any palpitations, chest pain, shortness of breath, or peripheral edema. No lightheadedness, headaches, syncope, orthopnea, or PND.  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   Fatty liver 11/2010   Gastritis    GERD (gastroesophageal reflux disease)    HOCM (hypertrophic obstructive cardiomyopathy) ([place]) 12/10/2019   Hx: UTI (urinary tract infection)    Hypertension    Inguinal hernia    Morbid obesity ([place]) 12/10/2019   NSVT (nonsustained ventricular tachycardia) ([place]) 03/10/2020   Obesity, Class III, BMI 40-49.9 (morbid obesity) ([place])    Periumbilical hernia    Upper GI bleed 09/11/2020    Past Surgical History:  Procedure Laterality Date   ABDOMINAL HYSTERECTOMY     BIOPSY  09/13/2020   Procedure: BIOPSY;  Surgeon: Jackquline Denmark, MD;  Location: [place];  Service: Endoscopy;;   BIOPSY  10/05/2020  Procedure: BIOPSY;  Surgeon: Jerene Bears, MD;  Location: [facility] ENDOSCOPY;  Service: Gastroenterology;;   BRONCHOSCOPY, LYMPH NODE BX  MAY 2011   CHOLECYSTECTOMY  2001   COLONOSCOPY N/A 09/13/2020   Procedure: COLONOSCOPY;  Surgeon: Jackquline Denmark, MD;  Location: [facility] ENDOSCOPY;  Service: Endoscopy;  Laterality: N/A;   ERCP/SPHINCTEROTOMY/STONE EXTRACTION  2001   ESOPHAGOGASTRODUODENOSCOPY (EGD) WITH PROPOFOL N/A 09/13/2020   Procedure: ESOPHAGOGASTRODUODENOSCOPY (EGD) WITH PROPOFOL;  Surgeon: Jackquline Denmark, MD;  Location: [facility] ENDOSCOPY;  Service: Endoscopy;  Laterality: N/A;    ESOPHAGOGASTRODUODENOSCOPY (EGD) WITH PROPOFOL N/A 10/05/2020   Procedure: ESOPHAGOGASTRODUODENOSCOPY (EGD) WITH PROPOFOL;  Surgeon: Jerene Bears, MD;  Location: [facility] ENDOSCOPY;  Service: Gastroenterology;  Laterality: N/A;   FLEXIBLE SIGMOIDOSCOPY  11/29/2010   Procedure: FLEXIBLE SIGMOIDOSCOPY;  Surgeon: Lafayette Dragon, MD;  Location: [facility] ENDOSCOPY;  Service: Endoscopy;  Laterality: N/A;   HEMOSTASIS CLIP PLACEMENT  10/05/2020   Procedure: HEMOSTASIS CLIP PLACEMENT;  Surgeon: Jerene Bears, MD;  Location: [facility] ENDOSCOPY;  Service: Gastroenterology;;   ICD IMPLANT N/A 03/25/2020   Procedure: ICD IMPLANT;  Surgeon: Vickie Epley, MD;  Location: [place] CV LAB;  Service: Cardiovascular;  Laterality: N/A;     Current Outpatient Medications  Medication Sig Dispense Refill   albuterol (PROVENTIL) (2.5 MG/3ML) 0.083% nebulizer solution Inhale 3 mL 3 times a day by nebulization route as needed for 30 days.     albuterol (VENTOLIN HFA) 108 (90 Base) MCG/ACT inhaler Inhale 2 puffs into the lungs every 6 (six) hours as needed. For asthma 1 each 3   amLODipine (NORVASC) 10 MG tablet Take 1 tablet by mouth once daily 90 tablet 1   apixaban (ELIQUIS) 5 MG TABS tablet Take 1 tablet by mouth twice daily 180 tablet 1   carvedilol (COREG) 25 MG tablet Take 1 tablet by mouth twice daily 180 tablet 1   diclofenac Sodium (VOLTAREN) 1 % GEL Apply 2 g topically daily as needed (For pain). 50 g 1   FARXIGA 10 MG TABS tablet TAKE 1 TABLET BY MOUTH ONCE DAILY BEFORE BREAKFAST 90 tablet 0   furosemide (LASIX) 80 MG tablet Take 1 tablet by mouth once daily 90 tablet 1   gabapentin (NEURONTIN) 300 MG capsule Take 1 capsule by mouth twice daily 180 capsule 0   guaiFENesin-dextromethorphan (ROBITUSSIN DM) 100-10 MG/5ML syrup Take 5 mLs by mouth every 4 (four) hours as needed for cough. 118 mL 0   Magnesium 250 MG TABS Take 250 mg by mouth daily at 2 PM.     melatonin 5 MG TABS Take 5 mg by mouth daily at 2 PM.      meloxicam (MOBIC) 15 MG tablet Take 15 mg by mouth daily as needed.     metFORMIN (GLUCOPHAGE) 500 MG tablet TAKE 1 TABLET BY MOUTH TWICE DAILY WITH A MEAL 180 tablet 1   mometasone-formoterol (DULERA) 100-5 MCG/ACT AERO Inhale 2 puffs into the lungs 2 (two) times daily.     Multiple Vitamin (MULTIVITAMIN WITH MINERALS) TABS tablet Take 1 tablet by mouth daily.     nicotine (NICODERM CQ - DOSED IN MG/24 HOURS) 14 mg/24hr patch Place 1 patch (14 mg total) onto the skin daily. 28 patch 0   pantoprazole (PROTONIX) 40 MG tablet Take 1 tablet (40 mg total) by mouth 2 (two) times daily. 180 tablet 2   potassium chloride SA (KLOR-CON M) 20 MEQ tablet Take 1 tablet (20 mEq total) by mouth daily. 90 tablet 2   sacubitril-valsartan (ENTRESTO) 24-26  MG Take 1 tablet by mouth 2 (two) times daily. 180 tablet 1   sacubitril-valsartan (ENTRESTO) 24-26 MG Take 1 tablet by mouth 2 (two) times daily. 28 tablet 0   ferrous sulfate 325 (65 FE) MG EC tablet Take 1 tablet (325 mg total) by mouth 2 (two) times daily with a meal. 180 tablet 2   No current facility-administered medications for this visit.    Allergies:   Patient has no known allergies.    Social History:  The patient  reports that she has been smoking cigarettes. She has a 7.50 pack-year smoking history. She has never used smokeless tobacco. She reports that she does not drink alcohol and does not use drugs.   Family History:  The patient's family history includes Colon cancer in her father; Diabetes in her mother and sister; Heart attack in her father.    ROS:  Please see the history of present illness.   (+) Knee pain All other systems are reviewed and negative.    PHYSICAL EXAM: VS:  BP 138/64   Pulse 77   Ht 5\' 5"  (1.651 m)   Wt 256 lb 1.6 oz (116.2 kg)   SpO2 95%   BMI 42.62 kg/m  , BMI Body mass index is 42.62 kg/m. GENERAL:  Well appearing HEENT:  Pupils equal round and reactive, fundi not visualized, oral mucosa  unremarkable NECK:  No JVD, waveform within normal limits, carotid upstroke brisk and symmetric, no bruits LUNGS:  Clear to auscultation bilaterally HEART:  RRR.  PMI not displaced or sustained,S1 and S2 within normal limits, no S3, no S4, no clicks, no rubs, III/VI systolic murmurs at the LUSB.  +Augment with Valsalva and hand grip.  Radiates to carotids ABD:  Flat, positive bowel sounds normal in frequency in pitch, no bruits, no rebound, no guarding, no midline pulsatile mass, no hepatomegaly, no splenomegaly EXT:  2 plus pulses throughout, trace LE edema to the upper tibia bilaterally, no cyanosis no clubbing SKIN:  No rashes no nodules NEURO:  Cranial nerves II through XII grossly intact, motor grossly intact throughout [facility]:  Cognitively intact, oriented to person place and time  EKG:  EKG is personally reviewed 03/29/2022: The EKG was not ordered. 12/10/19: sinus rhythm.  Rate 74 bpm.  LVH.  Ventricular fusion complexes.    Echo 11/2019: 1. Left ventricular ejection fraction, by estimation, is 65 to 70%. The  left ventricle has normal function. The left ventricle has no regional  wall motion abnormalities. There is severe concentric left ventricular  hypertrophy. Left ventricular diastolic   parameters are consistent with Grade II diastolic dysfunction  (pseudonormalization).   2. Right ventricular systolic function is normal. The right ventricular  size is normal.   3. Left atrial size was moderately dilated.   4. Right atrial size was moderately dilated.   5. The mitral valve is normal in structure. No evidence of mitral valve  regurgitation.   6. The aortic valve is normal in structure. Aortic valve regurgitation is  not visualized.   FINDINGS   Left Ventricle: Left ventricular ejection fraction, by estimation, is 65  to 70%. The left ventricle has normal function. The left ventricle has no  regional wall motion abnormalities. Definity contrast agent was given IV  to  delineate the left ventricular   endocardial borders. The left ventricular internal cavity size was small.  There is severe concentric left ventricular hypertrophy. Left ventricular  diastolic parameters are consistent with Grade II diastolic dysfunction  (pseudonormalization).  Cardiac MRI 01/2020: IMPRESSION: 1. Normal LV size with severe asymmetric basal to mid septal hypertrophy, EF 56% with normal wall motion. Very mild mitral valve SAM without significant regurgitation. The LGE pattern and low ECV percentage are suggestive of hypertrophic cardiomyopathy. The extensive LGE does suggest a higher risk for ventricular arrhythmias.   2.  Normal right ventricular size and systolic function.   This study is suggestive of hypertrophic cardiomyopathy.  Recent Labs: 12/11/2021: B Natriuretic Peptide 1,127.5 12/12/2021: Magnesium 2.7 12/14/2021: Hemoglobin 13.8; Platelets 243 02/22/2022: ALT 7; BUN 12; Creatinine, Ser 0.72; Potassium 4.5; Sodium 143    Lipid Panel    Component Value Date/Time   CHOL 195 08/10/2021 1130   TRIG 132 08/10/2021 1130   HDL 72 08/10/2021 1130   CHOLHDL 2.7 08/10/2021 1130   LDLCALC 100 (H) 08/10/2021 1130      Wt Readings from Last 3 Encounters:  03/29/22 256 lb 1.6 oz (116.2 kg)  01/11/22 248 lb (112.5 kg)  12/14/21 249 lb 5.4 oz (113.1 kg)      ASSESSMENT AND PLAN:  # Hypertrophic Cardiomyopathy (HCM): - Continue current medications: amlodipine, carvedilol, and Entresto for blood pressure control. - Schedule a repeat echocardiogram in September to monitor gradient. - No additional medical therapy indicated at this time, as the patient is asymptomatic. - Defibrillator management by EP.  # Osteoarthritis and Knee Pain: - Encourage weight loss to alleviate stress on knees. - Recommend engaging in water activities for low-impact exercise and cardiovascular health.  # Smoking: - Current status: Patient smoking four cigarettes per day. -  Encourage further smoking cessation efforts. - Note: Patient not utilizing nicotine gum or patches due to ulcers.  # Hypertension: - Continue amlodipine, carvedilol, and Entresto for management. - Plan: Monitor blood pressure regularly due to high readings during the visit and recent lapse in [place].  # Diet and Nutrition: - Encourage home cooking and adherence to a low-sodium diet for improved blood pressure and overall health. - Support system: Patient's sister assists with cooking.   Current medicines are reviewed at length with the patient today.  The patient does not have concerns regarding medicines.  The following changes have been made: Resume Entresto  Labs/ tests ordered today include:   Orders Placed This Encounter  Procedures   ECHOCARDIOGRAM COMPLETE    Disposition:   [facility] Jaimarie Rapozo C. Oval Linsey, MD, [facility] after September ECHO.  I,Coren O'Brien,acting as a Education administrator for [place], MD.,have documented all relevant documentation on the behalf of Skeet Latch, MD,as directed by  Skeet Latch, MD while in the presence of Skeet Latch, MD.  I, Luis Lopez Oval Linsey, MD have reviewed all documentation for this visit.  The documentation of the exam, diagnosis, procedures, and orders on 03/29/2022 are all accurate and complete.

## 2022-03-29 NOTE — Patient Instructions (Addendum)
Medication Instructions:  Your physician recommends that you continue on your current medications as directed. Please refer to the Current Medication list given to you today.  *If you need a refill on your cardiac medications before your next appointment, please call your pharmacy*  Lab Work: NONE  Testing/Procedures: Your physician has requested that you have an echocardiogram. Echocardiography is a painless test that uses sound waves to create images of your heart. It provides your doctor with information about the size and shape of your heart and how well your heart's chambers and valves are working. This procedure takes approximately one hour. There are no restrictions for this procedure. Please do NOT wear cologne, perfume, aftershave, or lotions (deodorant is allowed). Please arrive 15 minutes prior to your appointment time.  IN SEPTEMBER ABOUT 1 WEEK PRIOR TO FOLLOW UP   Follow-Up: At [facility], you and your health needs are our priority.  As part of our continuing mission to provide you with exceptional heart care, we have created designated Provider Care Teams.  These Care Teams include your primary Cardiologist (physician) and Advanced Practice Providers (APPs -  Physician Assistants and Nurse Practitioners) who all work together to provide you with the care you need, when you need it.  We recommend signing up for the patient portal called "MyChart".  Sign up information is provided on this After Visit Summary.  MyChart is used to connect with patients for Virtual Visits (Telemedicine).  Patients are able to view lab/test results, encounter notes, upcoming appointments, etc.  Non-urgent messages can be sent to your provider as well.   To learn more about what you can do with MyChart, go to [email or web address].    Your next appointment:   AFTER ECHO IN [place]   The format for your next appointment:   In Person  Provider:   DR [facility]

## 2022-05-01 ENCOUNTER — Other Ambulatory Visit: Payer: Self-pay | Admitting: Nurse Practitioner

## 2022-05-01 ENCOUNTER — Other Ambulatory Visit: Payer: Self-pay | Admitting: Cardiovascular Disease

## 2022-05-01 NOTE — Telephone Encounter (Signed)
Rx(s) sent to pharmacy electronically.  

## 2022-05-02 NOTE — Progress Notes (Signed)
Remote ICD transmission.   

## 2022-05-03 ENCOUNTER — Other Ambulatory Visit: Payer: Self-pay | Admitting: Pharmacist

## 2022-05-03 NOTE — Progress Notes (Signed)
Pharmacy Quality Review  Patient has a diagnosis of diabetes and no statin therapy prescribed. Reviewed patient for clinical recommendation.   Patient has a documentation of aortic atherosclerosis on prior imaging.   PREVENT Risk Score: 10 year risk of CVD: 10.4%; 30 year risk: 34.9% - 10 year risk of ASCVD: 5.8%; 30 year risk 19.7%  Provider previously discussed statin therapy with patient. Contacted patient to discuss. Left voicemail for patient to return my call. Upcoming PCP appointment on 5/6 - note placed in Appointment Notes.   Catie Eppie Fessel, PharmD, BCACP, CPP [facility] [phone]

## 2022-05-06 ENCOUNTER — Other Ambulatory Visit: Payer: Self-pay | Admitting: Nurse Practitioner

## 2022-05-06 DIAGNOSIS — I700 Atherosclerosis of aorta: Secondary | ICD-10-CM

## 2022-05-06 MED ORDER — ATORVASTATIN CALCIUM 20 MG PO TABS: 20.0000 mg | ORAL_TABLET | Freq: Every day | ORAL | 11 refills | Status: DC

## 2022-05-09 ENCOUNTER — Other Ambulatory Visit: Payer: Self-pay | Admitting: Nurse Practitioner

## 2022-05-09 DIAGNOSIS — M79642 Pain in left hand: Secondary | ICD-10-CM

## 2022-05-09 DIAGNOSIS — M545 Low back pain, unspecified: Secondary | ICD-10-CM

## 2022-05-13 ENCOUNTER — Other Ambulatory Visit: Payer: Self-pay | Admitting: Nurse Practitioner

## 2022-05-13 DIAGNOSIS — M79641 Pain in right hand: Secondary | ICD-10-CM

## 2022-05-13 DIAGNOSIS — M545 Low back pain, unspecified: Secondary | ICD-10-CM

## 2022-05-16 NOTE — Progress Notes (Signed)
Hershal Coria Martin,acting as a Neurosurgeon for Arnette Felts, FNP.,have documented all relevant documentation on the behalf of Arnette Felts, FNP,as directed by  Arnette Felts, FNP while in the presence of Arnette Felts, FNP.    Subjective:     Patient ID: Alexandra Bell , female    DOB: 1965-10-02 , 57 y.o.   MRN: [id number]   Chief Complaint  Patient presents with   Hypertension   Hyperlipidemia    HPI  Patient here for a blood pressure f/u. Patient states compliance with medications and has no other concerns today.   She has had 2 falls one last month and the month prior. No injuries. She has not had PT in "a while" She does see Dr. Luiz Blare at [facility].she is going to call to make an appt.   Hyperlipidemia This is a chronic problem. She has no history of chronic renal disease. Pertinent negatives include no chest pain.  Hypertension This is a chronic problem. The current episode started more than 1 year ago. The problem is uncontrolled. Pertinent negatives include no anxiety, chest pain, headaches or palpitations. Risk factors for coronary artery disease include sedentary lifestyle and obesity. Past treatments include ACE inhibitors and diuretics. There are no compliance problems.  There is no history of angina or kidney disease. There is no history of chronic renal disease.     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   Heart attack Father    Diabetes Sister      Current Outpatient Medications:    albuterol (VENTOLIN HFA) 108 (90 Base) MCG/ACT inhaler, Inhale 2 puffs into the lungs every 6 (six) hours as needed. For asthma, Disp: 1 each, Rfl: 3   amLODipine (NORVASC) 10 MG tablet, Take 1 tablet by mouth once daily, Disp: 90 tablet, Rfl: 2   apixaban (ELIQUIS) 5 MG TABS tablet, Take 1 tablet by mouth twice daily, Disp: 180 tablet, Rfl: 1   atorvastatin (LIPITOR) 20 MG tablet, Take 1 tablet (20 mg total) by mouth daily., Disp: 30 tablet, Rfl: 11   carvedilol (COREG) 25 MG tablet, Take 1 tablet by mouth twice daily, Disp: 180 tablet, Rfl: 1   diclofenac Sodium (VOLTAREN) 1 % GEL, Apply 2 g topically daily as needed (For pain)., Disp: 50 g, Rfl: 1   FARXIGA 10 MG TABS tablet, TAKE 1 TABLET BY MOUTH ONCE DAILY BEFORE BREAKFAST, Disp: 90 tablet, Rfl: 0   ferrous sulfate 325 (65 FE) MG EC tablet, Take 1 tablet (325 mg total) by mouth 2 (two) times daily with a meal.,  Disp: 180 tablet, Rfl: 2   furosemide (LASIX) 80 MG tablet, Take 1 tablet by mouth once daily, Disp: 90 tablet, Rfl: 1   guaiFENesin-dextromethorphan (ROBITUSSIN DM) 100-10 MG/5ML syrup, Take 5 mLs by mouth every 4 (four) hours as needed for cough., Disp: 118 mL, Rfl: 0   Magnesium 250 MG TABS, Take 250 mg by mouth daily at 2 PM., Disp: , Rfl:    melatonin 5 MG TABS, Take 5 mg by mouth daily at 2 PM., Disp: , Rfl:    meloxicam (MOBIC) 15 MG tablet, Take 15 mg by mouth daily as needed., Disp: , Rfl:    metFORMIN (GLUCOPHAGE) 500 MG tablet, TAKE 1 TABLET BY MOUTH TWICE DAILY WITH A MEAL, Disp: 180 tablet, Rfl: 1   Multiple Vitamin (MULTIVITAMIN WITH MINERALS) TABS tablet, Take 1 tablet by mouth daily., Disp: ,  Rfl:    nicotine (NICODERM CQ - DOSED IN MG/24 HOURS) 14 mg/24hr patch, Place 1 patch (14 mg total) onto the skin daily., Disp: 28 patch, Rfl: 0   potassium chloride SA (KLOR-CON M) 20 MEQ tablet, Take 1 tablet by mouth once daily, Disp: 90 tablet, Rfl: 0   sacubitril-valsartan (ENTRESTO) 24-26 MG, Take 1 tablet by mouth 2 (two) times daily., Disp: 180 tablet, Rfl: 1   sacubitril-valsartan (ENTRESTO) 24-26 MG, Take 1 tablet by mouth 2 (two) times daily., Disp: 28 tablet, Rfl: 0   albuterol (PROVENTIL) (2.5 MG/3ML) 0.083% nebulizer solution, Inhale 3 mL 3 times a day by nebulization route as needed for 30 days., Disp: 75 mL, Rfl: 3   gabapentin (NEURONTIN) 300 MG capsule, Take 1 capsule by mouth twice daily, Disp: 180 capsule, Rfl: 0   mometasone-formoterol (DULERA) 100-5 MCG/ACT AERO, Inhale 2 puffs into the lungs 2 (two) times daily., Disp: 1 each, Rfl: 5   pantoprazole (PROTONIX) 40 MG tablet, Take 1 tablet by mouth twice daily, Disp: 180 tablet, Rfl: 0   No Known Allergies   Review of Systems  Constitutional: Negative.   Respiratory: Negative.    Cardiovascular:  Negative for chest pain and palpitations.  Neurological:  Positive for numbness (bilateral hands). Negative for dizziness, weakness and headaches.  Psychiatric/Behavioral: Negative.       Today's Vitals   05/17/22 0948  BP: 128/70  Pulse: 79  Temp: 98.8 F (37.1 C)  Weight: 256 lb 12.8 oz (116.5 kg)  Height: 5\' 5"  (1.651 m)  PainSc: 0-No pain   Body mass index is 42.73 kg/m.  Wt Readings from Last 3 Encounters:  05/17/22 256 lb 12.8 oz (116.5 kg)  03/29/22 256 lb 1.6 oz (116.2 kg)  01/11/22 248 lb (112.5 kg)     Objective:  Physical Exam Vitals reviewed.  Constitutional:      General: She is not in acute distress.    Appearance: Normal appearance. She is well-developed. She is obese.  Cardiovascular:     Rate and Rhythm: Normal rate and regular rhythm.     Pulses:          Dorsalis pedis pulses are 1+ on the  right side and 1+ on the left side.     Heart sounds: Normal heart sounds. No murmur heard. Pulmonary:     Effort: Pulmonary effort is normal. No respiratory distress.     Breath sounds: Normal breath sounds. No wheezing.  Musculoskeletal:        General: No tenderness. Normal range of motion.     Right hand: No tenderness.     Left hand: No tenderness.  Right lower leg: Edema (trace) present.     Left lower leg: Edema (trace) present.     Comments: Using cane for ambulation  Skin:    General: Skin is warm and dry.     Capillary Refill: Capillary refill takes less than 2 seconds.     Comments: .   Neurological:     General: No focal deficit present.     Mental Status: She is alert and oriented to person, place, and time.     Cranial Nerves: No cranial nerve deficit.     Motor: No weakness.  Psychiatric:        Mood and Affect: Mood normal.        Behavior: Behavior normal.        Thought Content: Thought content normal.        Judgment: Judgment normal.         Assessment And Plan:     1. Essential hypertension Comments: Blood pressure is well controlled, continue current medications - BMP8+eGFR  2. Mixed hyperlipidemia Comments: Stable, continue current medications, tolerating well.  3. Diabetic polyneuropathy associated with type 2 diabetes mellitus (HCC) Comments: Stable, fairly controlled, continue current medications  4. Paroxysmal atrial fibrillation (HCC) Comments: Controlled, continue f/u with Cardiology  5. Class 3 severe obesity due to excess calories with serious comorbidity and body mass index (BMI) of 40.0 to 44.9 in adult (HCC)  6. Fall, initial encounter Comments: She is to call Dr. Luiz Blare to f/u, offered PT however she will see the orthopedic, no injuries.  7. Pain in other joint Comments: Will order autoimmune panel. - Autoimmune Profile  8. Tobacco use disorder Smoking cessation instruction/counseling given:  counseled patient on the dangers  of tobacco use, advised patient to stop smoking, and reviewed strategies to maximize success  9. Numbness and tingling in both hands Comments: Will check for metabolic causes. If normal I may consider EMG/NCV - Hemoglobin A1c - TSH - Vitamin B12  10. History of asthma - albuterol (PROVENTIL) (2.5 MG/3ML) 0.083% nebulizer solution; Inhale 3 mL 3 times a day by nebulization route as needed for 30 days.  Dispense: 75 mL; Refill: 3  11. COVID-19 vaccine administered Covid 19 vaccine given in office observed for 15 minutes without any adverse reaction - Pfizer Fall 2023 Covid-19 Vaccine 12yrs and older     Patient was given opportunity to ask questions. Patient verbalized understanding of the plan and was able to repeat key elements of the plan. All questions were answered to their satisfaction.  Arnette Felts, FNP   I, Arnette Felts, FNP, have reviewed all documentation for this visit. The documentation on 05/17/22 for the exam, diagnosis, procedures, and orders are all accurate and complete.   IF YOU HAVE BEEN REFERRED TO A SPECIALIST, IT MAY TAKE 1-2 WEEKS TO SCHEDULE/PROCESS THE REFERRAL. IF YOU HAVE NOT HEARD FROM US/SPECIALIST IN TWO WEEKS, PLEASE GIVE [place] A CALL AT [phone].   THE PATIENT IS ENCOURAGED TO PRACTICE SOCIAL DISTANCING DUE TO THE COVID-19 PANDEMIC.

## 2022-05-17 ENCOUNTER — Encounter: Payer: Self-pay | Admitting: Nurse Practitioner

## 2022-05-17 ENCOUNTER — Ambulatory Visit (INDEPENDENT_AMBULATORY_CARE_PROVIDER_SITE_OTHER): Payer: Medicaid Other | Admitting: Nurse Practitioner

## 2022-05-17 ENCOUNTER — Other Ambulatory Visit: Payer: Self-pay | Admitting: Nurse Practitioner

## 2022-05-17 VITALS — BP 128/70 | HR 79 | Temp 98.8°F | Ht 65.0 in | Wt 256.8 lb

## 2022-05-17 DIAGNOSIS — E1142 Type 2 diabetes mellitus with diabetic polyneuropathy: Secondary | ICD-10-CM | POA: Diagnosis not present

## 2022-05-17 DIAGNOSIS — I10 Essential (primary) hypertension: Secondary | ICD-10-CM

## 2022-05-17 DIAGNOSIS — W19XXXA Unspecified fall, initial encounter: Secondary | ICD-10-CM

## 2022-05-17 DIAGNOSIS — R200 Anesthesia of skin: Secondary | ICD-10-CM

## 2022-05-17 DIAGNOSIS — Z23 Encounter for immunization: Secondary | ICD-10-CM

## 2022-05-17 DIAGNOSIS — E782 Mixed hyperlipidemia: Secondary | ICD-10-CM

## 2022-05-17 DIAGNOSIS — F17200 Nicotine dependence, unspecified, uncomplicated: Secondary | ICD-10-CM

## 2022-05-17 DIAGNOSIS — R202 Paresthesia of skin: Secondary | ICD-10-CM | POA: Diagnosis not present

## 2022-05-17 DIAGNOSIS — M2559 Pain in other specified joint: Secondary | ICD-10-CM

## 2022-05-17 DIAGNOSIS — I480 Paroxysmal atrial fibrillation: Secondary | ICD-10-CM

## 2022-05-17 DIAGNOSIS — Z8709 Personal history of other diseases of the respiratory system: Secondary | ICD-10-CM

## 2022-05-17 DIAGNOSIS — E6609 Other obesity due to excess calories: Secondary | ICD-10-CM

## 2022-05-17 DIAGNOSIS — Z6841 Body Mass Index (BMI) 40.0 and over, adult: Secondary | ICD-10-CM | POA: Diagnosis not present

## 2022-05-17 MED ORDER — ALBUTEROL SULFATE (2.5 MG/3ML) 0.083% IN NEBU: INHALATION_SOLUTION | RESPIRATORY_TRACT | 3 refills | Status: AC

## 2022-05-17 MED ORDER — MOMETASONE FURO-FORMOTEROL FUM 100-5 MCG/ACT IN AERO: 2.0000 | INHALATION_SPRAY | Freq: Two times a day (BID) | RESPIRATORY_TRACT | 5 refills | Status: DC

## 2022-05-17 NOTE — Patient Instructions (Signed)

## 2022-05-18 LAB — BMP8+EGFR
BUN/Creatinine Ratio: 16 (ref 9–23)
BUN: 11 mg/dL (ref 6–24)
CO2: 25 mmol/L (ref 20–29)
Calcium: 10.3 mg/dL — ABNORMAL HIGH (ref 8.7–10.2)
Chloride: 102 mmol/L (ref 96–106)
Creatinine, Ser: 0.7 mg/dL (ref 0.57–1.00)
Glucose: 83 mg/dL (ref 70–99)
Potassium: 4.7 mmol/L (ref 3.5–5.2)
Sodium: 140 mmol/L (ref 134–144)
eGFR: 100 mL/min/{1.73_m2} (ref >59)

## 2022-05-18 LAB — AUTOIMMUNE PROFILE
Anti Nuclear Antibody (ANA): NEGATIVE
Complement C3, Serum: 138 mg/dL (ref 82–167)
dsDNA Ab: <1 IU/mL (ref 0–9)

## 2022-05-18 LAB — HEMOGLOBIN A1C
Est. average glucose Bld gHb Est-mCnc: 108 mg/dL
Hgb A1c MFr Bld: 5.4 % (ref 4.8–5.6)

## 2022-05-18 LAB — TSH: TSH: 1.38 u[IU]/mL (ref 0.450–4.500)

## 2022-05-18 LAB — VITAMIN B12: Vitamin B-12: 943 pg/mL (ref 232–1245)

## 2022-05-22 ENCOUNTER — Other Ambulatory Visit: Payer: Self-pay

## 2022-05-22 DIAGNOSIS — Z8709 Personal history of other diseases of the respiratory system: Secondary | ICD-10-CM

## 2022-05-22 MED ORDER — MOMETASONE FURO-FORMOTEROL FUM 100-5 MCG/ACT IN AERO: 2.0000 | INHALATION_SPRAY | Freq: Two times a day (BID) | RESPIRATORY_TRACT | 5 refills | Status: DC

## 2022-05-23 ENCOUNTER — Other Ambulatory Visit: Payer: Self-pay | Admitting: Nurse Practitioner

## 2022-05-31 DIAGNOSIS — M1711 Unilateral primary osteoarthritis, right knee: Secondary | ICD-10-CM | POA: Diagnosis not present

## 2022-05-31 DIAGNOSIS — M1712 Unilateral primary osteoarthritis, left knee: Secondary | ICD-10-CM | POA: Diagnosis not present

## 2022-05-31 DIAGNOSIS — M170 Bilateral primary osteoarthritis of knee: Secondary | ICD-10-CM | POA: Diagnosis not present

## 2022-06-01 ENCOUNTER — Other Ambulatory Visit: Payer: Self-pay

## 2022-06-03 DIAGNOSIS — M2550 Pain in unspecified joint: Secondary | ICD-10-CM | POA: Insufficient documentation

## 2022-06-19 NOTE — Progress Notes (Unsigned)
  Electrophysiology Office Note:   Date:  06/21/2022  ID:  Alexandra Bell, DOB [birth year], MRN [id number]  Primary Cardiologist: Chilton Si, MD Electrophysiologist: Lanier Prude, MD   History of Present Illness:   Alexandra Bell is a 57 y.o. female with h/o HOCM, Morbid obesity, and HTN seen today for routine electrophysiology followup.   Since last being seen in our clinic the patient reports doing well from a cardiac perspective.  she denies chest pain, palpitations, dyspnea, PND, orthopnea, nausea, vomiting, dizziness, syncope, edema, weight gain, or early satiety.   Review of systems complete and found to be negative unless listed in HPI.     Device History: Field seismologist ICD implanted 03/2020 for HOCM    Studies Reviewed:    ICD Interrogation-  reviewed in detail today,  See PACEART report.  EKG is not ordered today. EKG from 12/11/2021 reviewed which showed NSR at 78 bpm    Physical Exam:   VS:  BP 120/68   Pulse 72   Ht 5\' 5"  (1.651 m)   Wt 255 lb (115.7 kg)   SpO2 96%   BMI 42.43 kg/m    Wt Readings from Last 3 Encounters:  06/21/22 255 lb (115.7 kg)  05/17/22 256 lb 12.8 oz (116.5 kg)  03/29/22 256 lb 1.6 oz (116.2 kg)     GEN: Well nourished, well developed in no acute distress NECK: No JVD; No carotid bruits CARDIAC: Regular rate and rhythm, no murmurs, rubs, gallops RESPIRATORY:  Clear to auscultation without rales, wheezing or rhonchi  ABDOMEN: Soft, non-tender, non-distended EXTREMITIES:  No edema; No deformity   ASSESSMENT AND PLAN:    HOCM s/p Environmental manager dual chamber ICD  euvolemic today Stable on an appropriate medical regimen Normal ICD function See Pace Art report No changes today  Obesity Body mass index is 42.43 kg/m.  Encouraged lifestyle modification  HTN Stable on current regimen   PAF Continue eliquis for CHA2DS2/VASc of at least 4.    Disposition:   Follow up with Dr. Lalla Brothers in 12  months   Signed, Alexandra Freer, PA-C

## 2022-06-21 ENCOUNTER — Encounter: Payer: Self-pay | Admitting: Student

## 2022-06-21 ENCOUNTER — Ambulatory Visit: Payer: Medicaid Other | Attending: Student | Admitting: Student

## 2022-06-21 VITALS — BP 120/68 | HR 72 | Ht 65.0 in | Wt 255.0 lb

## 2022-06-21 DIAGNOSIS — I421 Obstructive hypertrophic cardiomyopathy: Secondary | ICD-10-CM | POA: Diagnosis not present

## 2022-06-21 DIAGNOSIS — I10 Essential (primary) hypertension: Secondary | ICD-10-CM

## 2022-06-21 LAB — CUP PACEART INCLINIC DEVICE CHECK
Date Time Interrogation Session: 20240613093045
HighPow Impedance: 68 Ohm
Implantable Lead Connection Status: 753985
Implantable Lead Connection Status: 753985
Implantable Lead Implant Date: 20220318
Implantable Lead Implant Date: 20220318
Implantable Lead Location: 753859
Implantable Lead Location: 753860
Implantable Lead Model: 673
Implantable Lead Model: 7841
Implantable Lead Serial Number: 1122351
Implantable Lead Serial Number: 158410
Implantable Pulse Generator Implant Date: 20220318
Lead Channel Impedance Value: 392 Ohm
Lead Channel Impedance Value: 590 Ohm
Lead Channel Pacing Threshold Amplitude: 0.7 V
Lead Channel Pacing Threshold Amplitude: 0.7 V
Lead Channel Pacing Threshold Pulse Width: 0.4 ms
Lead Channel Pacing Threshold Pulse Width: 0.4 ms
Lead Channel Sensing Intrinsic Amplitude: 10.8 mV
Lead Channel Sensing Intrinsic Amplitude: 25 mV
Lead Channel Setting Pacing Amplitude: 2 V
Lead Channel Setting Pacing Amplitude: 2.5 V
Lead Channel Setting Pacing Pulse Width: 0.4 ms
Lead Channel Setting Sensing Sensitivity: 0.5 mV
Pulse Gen Serial Number: 593724
Zone Setting Status: 755011

## 2022-06-21 NOTE — Patient Instructions (Signed)
Medication Instructions:  Your physician recommends that you continue on your current medications as directed. Please refer to the Current Medication list given to you today.  *If you need a refill on your cardiac medications before your next appointment, please call your pharmacy*   Lab Work: None ordered If you have labs (blood work) drawn today and your tests are completely normal, you will receive your results only by: MyChart Message (if you have MyChart) OR A paper copy in the mail If you have any lab test that is abnormal or we need to change your treatment, we will call you to review the results   Follow-Up: At [facility], you and your health needs are our priority.  As part of our continuing mission to provide you with exceptional heart care, we have created designated Provider Care Teams.  These Care Teams include your primary Cardiologist (physician) and Advanced Practice Providers (APPs -  Physician Assistants and Nurse Practitioners) who all work together to provide you with the care you need, when you need it.  Your next appointment:   1 year(s)  Provider:   Cameron Lambert, MD  

## 2022-06-28 ENCOUNTER — Ambulatory Visit (INDEPENDENT_AMBULATORY_CARE_PROVIDER_SITE_OTHER): Payer: Medicaid Other

## 2022-06-28 DIAGNOSIS — I421 Obstructive hypertrophic cardiomyopathy: Secondary | ICD-10-CM | POA: Diagnosis not present

## 2022-06-28 LAB — CUP PACEART REMOTE DEVICE CHECK
Battery Remaining Longevity: 138 mo
Battery Remaining Percentage: 100 %
Brady Statistic RA Percent Paced: 5 %
Brady Statistic RV Percent Paced: 1 %
Date Time Interrogation Session: 20240620005100
HighPow Impedance: 71 Ohm
Implantable Lead Connection Status: 753985
Implantable Lead Connection Status: 753985
Implantable Lead Implant Date: 20220318
Implantable Lead Implant Date: 20220318
Implantable Lead Location: 753859
Implantable Lead Location: 753860
Implantable Lead Model: 673
Implantable Lead Model: 7841
Implantable Lead Serial Number: 1122351
Implantable Lead Serial Number: 158410
Implantable Pulse Generator Implant Date: 20220318
Lead Channel Impedance Value: 389 Ohm
Lead Channel Impedance Value: 603 Ohm
Lead Channel Setting Pacing Amplitude: 2 V
Lead Channel Setting Pacing Amplitude: 2.5 V
Lead Channel Setting Pacing Pulse Width: 0.4 ms
Lead Channel Setting Sensing Sensitivity: 0.5 mV
Pulse Gen Serial Number: 593724
Zone Setting Status: 755011

## 2022-07-07 ENCOUNTER — Other Ambulatory Visit: Payer: Self-pay | Admitting: Cardiovascular Disease

## 2022-07-09 NOTE — Telephone Encounter (Signed)
Rx(s) sent to pharmacy electronically.  

## 2022-07-18 NOTE — Progress Notes (Signed)
Remote ICD transmission.   

## 2022-07-30 ENCOUNTER — Other Ambulatory Visit: Payer: Self-pay | Admitting: Nurse Practitioner

## 2022-08-21 ENCOUNTER — Encounter: Payer: 59 | Admitting: Nurse Practitioner

## 2022-08-23 ENCOUNTER — Encounter: Payer: Self-pay | Admitting: Nurse Practitioner

## 2022-08-23 ENCOUNTER — Other Ambulatory Visit (HOSPITAL_COMMUNITY)
Admission: RE | Admit: 2022-08-23 | Discharge: 2022-08-23 | Disposition: A | Discharge status: Home / Self Care | Payer: Medicaid Other | Source: Ambulatory Visit | Attending: Nurse Practitioner | Admitting: Nurse Practitioner

## 2022-08-23 ENCOUNTER — Ambulatory Visit (INDEPENDENT_AMBULATORY_CARE_PROVIDER_SITE_OTHER): Payer: Medicaid Other | Admitting: Nurse Practitioner

## 2022-08-23 ENCOUNTER — Ambulatory Visit: Payer: Medicaid Other | Admitting: Nurse Practitioner

## 2022-08-23 VITALS — BP 120/60 | HR 78 | Temp 98.7°F | Ht 65.0 in | Wt 248.8 lb

## 2022-08-23 DIAGNOSIS — Z124 Encounter for screening for malignant neoplasm of cervix: Secondary | ICD-10-CM | POA: Diagnosis not present

## 2022-08-23 DIAGNOSIS — E782 Mixed hyperlipidemia: Secondary | ICD-10-CM | POA: Diagnosis not present

## 2022-08-23 DIAGNOSIS — R7303 Prediabetes: Secondary | ICD-10-CM

## 2022-08-23 DIAGNOSIS — W19XXXD Unspecified fall, subsequent encounter: Secondary | ICD-10-CM | POA: Diagnosis not present

## 2022-08-23 DIAGNOSIS — G629 Polyneuropathy, unspecified: Secondary | ICD-10-CM | POA: Diagnosis not present

## 2022-08-23 DIAGNOSIS — I10 Essential (primary) hypertension: Secondary | ICD-10-CM

## 2022-08-23 DIAGNOSIS — Z0000 Encounter for general adult medical examination without abnormal findings: Secondary | ICD-10-CM

## 2022-08-23 DIAGNOSIS — I480 Paroxysmal atrial fibrillation: Secondary | ICD-10-CM

## 2022-08-23 DIAGNOSIS — Z1231 Encounter for screening mammogram for malignant neoplasm of breast: Secondary | ICD-10-CM | POA: Diagnosis not present

## 2022-08-23 DIAGNOSIS — Z79899 Other long term (current) drug therapy: Secondary | ICD-10-CM

## 2022-08-23 DIAGNOSIS — Z6841 Body Mass Index (BMI) 40.0 and over, adult: Secondary | ICD-10-CM | POA: Diagnosis not present

## 2022-08-23 DIAGNOSIS — I700 Atherosclerosis of aorta: Secondary | ICD-10-CM | POA: Diagnosis not present

## 2022-08-23 DIAGNOSIS — W19XXXA Unspecified fall, initial encounter: Secondary | ICD-10-CM | POA: Insufficient documentation

## 2022-08-23 DIAGNOSIS — E66813 Obesity, class 3: Secondary | ICD-10-CM

## 2022-08-23 LAB — POCT URINALYSIS DIP (CLINITEK)
Blood, UA: NEGATIVE
Glucose, UA: 500 mg/dL — AB
Ketones, POC UA: NEGATIVE mg/dL
Leukocytes, UA: NEGATIVE
Nitrite, UA: NEGATIVE
POC PROTEIN,UA: NEGATIVE
Spec Grav, UA: >=1.03 — AB (ref 1.010–1.025)
Urobilinogen, UA: 0.2 E.U./dL
pH, UA: 5.5 (ref 5.0–8.0)

## 2022-08-23 MED ORDER — WEGOVY 0.5 MG/0.5ML ~~LOC~~ SOAJ: 0.5000 mg | SUBCUTANEOUS | 0 refills | Status: DC

## 2022-08-23 NOTE — Assessment & Plan Note (Signed)
Cholesterol levels are stable.  Continue statin, tolerating well.

## 2022-08-23 NOTE — Assessment & Plan Note (Signed)

## 2022-08-23 NOTE — Progress Notes (Signed)
Madelaine Bhat, CMA,acting as a Neurosurgeon for Arnette Felts, FNP.,have documented all relevant documentation on the behalf of Arnette Felts, FNP,as directed by  Arnette Felts, FNP while in the presence of Arnette Felts, FNP.  Subjective:    Patient ID: Alexandra Bell , female    DOB: Apr 23, 1965 , 57 y.o.   MRN: [id number]  Chief Complaint  Patient presents with   Annual Exam    HPI  Patient presents today for HM, patient reports compliance with medications. Patient denies any chest pain, SOB, and headaches. Patient reports her insurance is requiring her to get a PAP, patient reports she had a partial hysterectomy about 27 years ago. She continues to work part time. She is now on medicaid. She does not have disability but is actively trying.   BP Readings from Last 3 Encounters: 08/23/22 : 120/60 06/21/22 : 120/68 05/17/22 : 128/70  Wt Readings from Last 3 Encounters: 08/23/22 : 248 lb 12.8 oz (112.9 kg) 06/21/22 : 255 lb (115.7 kg) 05/17/22 : 256 lb 12.8 oz (116.5 kg)         Past Medical History:  Diagnosis Date   Acute asthma exacerbation 05/18/2009   Qualifier: Diagnosis of   By: Yetta Barre RN, Aalyah         Acute renal failure (HCC) 11/27/2010   Adrenal adenoma    Arthritis    Asthma    Colitis, eosinophilic 12/15/2010   Common bile duct stone    COPD (chronic obstructive pulmonary disease) (HCC)    Duodenal ulcer    Eosinophilia 01/16/2011   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   Heart attack Father    Diabetes Sister      Current Outpatient Medications:    albuterol (PROVENTIL) (2.5 MG/3ML) 0.083% nebulizer solution, Inhale 3 mL 3 times a day by nebulization route as needed for 30 days., Disp: 75 mL, Rfl: 3   albuterol (VENTOLIN HFA) 108 (90 Base) MCG/ACT inhaler, Inhale 2 puffs into the lungs every 6 (six) hours as needed. For asthma, Disp: 1 each, Rfl: 3   amLODipine (NORVASC) 10 MG tablet, Take 1 tablet by mouth once daily, Disp: 90 tablet, Rfl: 2   apixaban (ELIQUIS) 5 MG TABS tablet, Take 1 tablet by mouth twice daily, Disp: 180 tablet, Rfl: 1   atorvastatin (LIPITOR) 20 MG tablet, Take 1 tablet (20 mg total) by mouth daily., Disp: 30 tablet, Rfl: 11   carvedilol (COREG) 25 MG tablet, Take 1 tablet by mouth twice daily, Disp: 180 tablet, Rfl: 1   diclofenac Sodium (VOLTAREN) 1 % GEL, Apply 2 g topically daily as needed (For pain)., Disp: 50 g, Rfl: 1   FARXIGA 10 MG TABS tablet, TAKE 1 TABLET BY MOUTH ONCE DAILY BEFORE BREAKFAST, Disp: 90 tablet, Rfl: 0   ferrous sulfate 325 (65 FE) MG EC tablet, Take 1 tablet (325 mg total) by mouth 2 (  two) times daily with a meal., Disp: 180 tablet, Rfl: 2   furosemide (LASIX) 80 MG tablet, Take 1 tablet by mouth once daily, Disp: 90 tablet, Rfl: 1   gabapentin (NEURONTIN) 300 MG capsule, Take 1 capsule by mouth twice daily, Disp: 180 capsule, Rfl: 0   guaiFENesin-dextromethorphan (ROBITUSSIN DM) 100-10 MG/5ML syrup, Take 5 mLs by mouth every 4 (four) hours as needed for cough., Disp: 118 mL, Rfl: 0   KLOR-CON M20 20 MEQ tablet, Take 1 tablet by mouth once daily, Disp: 90 tablet, Rfl: 0   Magnesium 250 MG TABS, Take 250 mg by mouth daily at 2 PM., Disp: , Rfl:    melatonin 5 MG TABS, Take 5 mg by mouth daily at 2 PM., Disp: , Rfl:    meloxicam (MOBIC) 15 MG tablet, Take 15 mg by mouth daily as needed., Disp: , Rfl:    metFORMIN (GLUCOPHAGE) 500 MG tablet, TAKE 1 TABLET BY MOUTH TWICE DAILY WITH A  MEAL, Disp: 180 tablet, Rfl: 1   mometasone-formoterol (DULERA) 100-5 MCG/ACT AERO, Inhale 2 puffs into the lungs 2 (two) times daily., Disp: 1 each, Rfl: 5   Multiple Vitamin (MULTIVITAMIN WITH MINERALS) TABS tablet, Take 1 tablet by mouth daily., Disp: , Rfl:    nicotine (NICODERM CQ - DOSED IN MG/24 HOURS) 14 mg/24hr patch, Place 1 patch (14 mg total) onto the skin daily., Disp: 28 patch, Rfl: 0   pantoprazole (PROTONIX) 40 MG tablet, Take 1 tablet by mouth twice daily, Disp: 180 tablet, Rfl: 0   sacubitril-valsartan (ENTRESTO) 24-26 MG, Take 1 tablet by mouth 2 (two) times daily., Disp: 180 tablet, Rfl: 1   sacubitril-valsartan (ENTRESTO) 24-26 MG, Take 1 tablet by mouth 2 (two) times daily., Disp: 28 tablet, Rfl: 0   Semaglutide-Weight Management (WEGOVY) 0.5 MG/0.5ML SOAJ, Inject 0.5 mg into the skin once a week., Disp: 2 mL, Rfl: 0   No Known Allergies    The patient states she uses status post hysterectomy for birth control. No LMP recorded. Patient has had a hysterectomy.  Negative for: breast discharge, breast lump(s), breast pain and breast self exam. Associated symptoms include abnormal vaginal bleeding. Pertinent negatives include abnormal bleeding (hematology), anxiety, decreased libido, depression, difficulty falling sleep, dyspareunia, history of infertility, nocturia, sexual dysfunction, sleep disturbances, urinary incontinence, urinary urgency, vaginal discharge and vaginal itching. Diet regular; low salt. The patient states her exercise level is minimal - once a week with walking. She fell getting in the shower 2 months ago. She does not have a shower chair. She fell again on Saturday. Her day to day activity is getting worse.   The patient's tobacco use is:  Social History   Tobacco Use  Smoking Status Every Day   Current packs/day: 0.25   Average packs/day: 0.3 packs/day for 30.0 years (7.5 ttl pk-yrs)   Types: Cigarettes  Smokeless Tobacco Never  Tobacco Comments   she  is less than a PPD - states down to 6 cigarettes a day - 10/14, reports smoking 1 PPD for 10 years; she has cut down to 3 a day  . She has been exposed to passive smoke. The patient's alcohol use is:  Social History   Substance and Sexual Activity  Alcohol Use No  . Additional information: Last pap unknown, next one scheduled for today to confirm if had cervix removed.    Review of Systems  Constitutional: Negative.   HENT: Negative.    Eyes: Negative.   Respiratory: Negative.    Cardiovascular:  Negative.   Gastrointestinal: Negative.   Endocrine: Negative.   Genitourinary: Negative.   Musculoskeletal: Negative.   Skin: Negative.   Allergic/Immunologic: Negative.   Neurological: Negative.   Hematological: Negative.   Psychiatric/Behavioral: Negative.       Today's Vitals   08/23/22 1042  BP: 120/60  Pulse: 78  Temp: 98.7 F (37.1 C)  TempSrc: Oral  Weight: 248 lb 12.8 oz (112.9 kg)  Height: 5\' 5"  (1.651 m)  PainSc: 0-No pain   Body mass index is 41.4 kg/m.  Wt Readings from Last 3 Encounters:  08/23/22 248 lb 12.8 oz (112.9 kg)  06/21/22 255 lb (115.7 kg)  05/17/22 256 lb 12.8 oz (116.5 kg)     Objective:  Physical Exam Vitals reviewed.  Constitutional:      General: She is not in acute distress.    Appearance: Normal appearance. She is well-developed. She is obese.  HENT:     Head: Normocephalic and atraumatic.     Right Ear: Hearing and external ear normal. There is impacted cerumen.     Left Ear: Hearing, tympanic membrane, ear canal and external ear normal. There is no impacted cerumen.     Nose: Nose normal.     Mouth/Throat:     Mouth: Mucous membranes are moist.  Eyes:     General: Lids are normal.     Extraocular Movements: Extraocular movements intact.     Conjunctiva/sclera: Conjunctivae normal.     Pupils: Pupils are equal, round, and reactive to light.     Funduscopic exam:    Right eye: No papilledema.        Left eye: No papilledema.   Neck:     Thyroid: No thyroid mass.     Vascular: No carotid bruit.  Cardiovascular:     Rate and Rhythm: Normal rate and regular rhythm.     Pulses: Normal pulses.     Heart sounds: Normal heart sounds. No murmur heard. Pulmonary:     Effort: Pulmonary effort is normal. No respiratory distress.     Breath sounds: Normal breath sounds. No wheezing.  Chest:     Chest wall: No mass.  Breasts:    Tanner Score is 5.     Right: Normal. No mass or tenderness.     Left: Normal. No mass or tenderness.  Abdominal:     General: Abdomen is flat. Bowel sounds are normal. There is no distension.     Palpations: Abdomen is soft.     Tenderness: There is no abdominal tenderness.  Genitourinary:    General: Normal vulva.     Exam position: Lithotomy position.     Tanner stage (genital): 5.     Labia:        Right: No rash.        Left: No rash.      Vagina: Normal.     Uterus: Absent.      Adnexa: Right adnexa normal and left adnexa normal.     Rectum: Normal. Guaiac result negative.     Comments: No cervix present Musculoskeletal:        General: No swelling. Normal range of motion.     Cervical back: Full passive range of motion without pain, normal range of motion and neck supple.     Right lower leg: No edema.     Left lower leg: No edema.  Lymphadenopathy:     Upper Body:     Right upper body: No supraclavicular, axillary or pectoral  adenopathy.     Left upper body: No supraclavicular, axillary or pectoral adenopathy.  Skin:    General: Skin is warm and dry.     Capillary Refill: Capillary refill takes less than 2 seconds.     Findings: Bruising (left anterior shin from fall) present.  Neurological:     General: No focal deficit present.     Mental Status: She is alert and oriented to person, place, and time.     Cranial Nerves: No cranial nerve deficit.     Sensory: No sensory deficit.  Psychiatric:        Mood and Affect: Mood normal.        Behavior: Behavior normal.         Thought Content: Thought content normal.        Judgment: Judgment normal.         Assessment And Plan:     Encounter for annual health examination Assessment & Plan: Behavior modifications discussed and diet history reviewed.   Pt will continue to exercise regularly and modify diet with low GI, plant based foods and decrease intake of processed foods.  Recommend intake of daily multivitamin, Vitamin D, and calcium.  Recommend mammogram and colonoscopy for preventive screenings, as well as recommend immunizations that include influenza, TDAP    Encounter for Papanicolaou smear of cervix Assessment & Plan: No cervix present, will get results then remove from HM  Orders: -     Cytology - PAP  Encounter for screening mammogram for breast cancer Assessment & Plan: Pt instructed on Self Breast Exam.According to ACOG guidelines Women aged 43 and older are recommended to get an annual mammogram. Order placed for mammogram   Orders: -     Digital Screening Mammogram, Left and Right; Future  Class 3 severe obesity due to excess calories with serious comorbidity and body mass index (BMI) of 40.0 to 44.9 in adult [facility]) Assessment & Plan: She is encouraged to strive for BMI less than 30 to decrease cardiac risk. Advised to aim for at least 150 minutes of exercise per week. She has significant knee pain that limits her ability to exercise regularly   Orders: -     UEAVWU; Inject 0.5 mg into the skin once a week.  Dispense: 2 mL; Refill: 0  Essential hypertension Assessment & Plan: Blood pressure is well controlled, continue current medications. Continue f/u with Cardiology. EKG done with SR Sinus Rhythm shows Left atrial enlargement, Negative T-waves -Lateral ischemia.  Heart rate 69   Orders: -     EKG 12-Lead -     POCT URINALYSIS DIP (CLINITEK) -     Microalbumin / creatinine urine ratio -     CMP14+EGFR -     AMB Referral to Managed Medicaid Care Management  Mixed  hyperlipidemia Assessment & Plan: Cholesterol levels are stable. Continue statin, tolerating well.   Orders: -     Lipid panel  Polyneuropathy -     AMB Referral to Managed Medicaid Care Management -     Ambulatory referral to Physical Therapy  Prediabetes Assessment & Plan: HgbA1c is normal, will recheck today.   Orders: -     Hemoglobin A1c  Atherosclerosis of aorta (HCC) Assessment & Plan: Continue statin. Tolerating well   Orders: -     AMB Referral to Managed Medicaid Care Management  Fall, subsequent encounter Assessment & Plan: Larey Seat going in tub, will order a shower bench and refer to PT for evaluation.   Orders: -  AMB Referral to Managed Medicaid Care Management -     Ambulatory referral to Physical Therapy  Other long term (current) drug therapy -     CBC with Differential/Platelet  Paroxysmal atrial fibrillation (HCC)     Return for 1 year physical, 6 month bp check. Patient was given opportunity to ask questions. Patient verbalized understanding of the plan and was able to repeat key elements of the plan. All questions were answered to their satisfaction.   Arnette Felts, FNP  I, Arnette Felts, FNP, have reviewed all documentation for this visit. The documentation on 08/23/22 for the exam, diagnosis, procedures, and orders are all accurate and complete.

## 2022-08-23 NOTE — Assessment & Plan Note (Signed)
She is encouraged to strive for BMI less than 30 to decrease cardiac risk. Advised to aim for at least 150 minutes of exercise per week. She has significant knee pain that limits her ability to exercise regularly

## 2022-08-23 NOTE — Assessment & Plan Note (Addendum)
Blood pressure is well controlled, continue current medications. Continue f/u with Cardiology. EKG done with SR Sinus Rhythm shows Left atrial enlargement, Negative T-waves -Lateral ischemia.  Heart rate 69

## 2022-08-23 NOTE — Assessment & Plan Note (Signed)
HgbA1c is normal, will recheck today.

## 2022-08-23 NOTE — Assessment & Plan Note (Signed)
Continue statin.  Tolerating well.

## 2022-08-23 NOTE — Assessment & Plan Note (Signed)
Pt instructed on Self Breast Exam.According to ACOG guidelines Women aged 58 and older are recommended to get an annual mammogram. Order placed for mammogram

## 2022-08-23 NOTE — Assessment & Plan Note (Signed)
No cervix present, will get results then remove from [facility]

## 2022-08-23 NOTE — Assessment & Plan Note (Signed)
Larey Seat going in tub, will order a shower bench and refer to PT for evaluation.

## 2022-08-24 LAB — CMP14+EGFR
ALT: 20 IU/L (ref 0–32)
AST: 22 IU/L (ref 0–40)
Albumin: 4.4 g/dL (ref 3.8–4.9)
Alkaline Phosphatase: 75 IU/L (ref 44–121)
BUN/Creatinine Ratio: 15 (ref 9–23)
BUN: 12 mg/dL (ref 6–24)
Bilirubin Total: 0.2 mg/dL (ref 0.0–1.2)
CO2: 27 mmol/L (ref 20–29)
Calcium: 10.9 mg/dL — ABNORMAL HIGH (ref 8.7–10.2)
Chloride: 104 mmol/L (ref 96–106)
Creatinine, Ser: 0.79 mg/dL (ref 0.57–1.00)
Globulin, Total: 2.4 g/dL (ref 1.5–4.5)
Glucose: 84 mg/dL (ref 70–99)
Potassium: 4.8 mmol/L (ref 3.5–5.2)
Sodium: 143 mmol/L (ref 134–144)
Total Protein: 6.8 g/dL (ref 6.0–8.5)
eGFR: 87 mL/min/{1.73_m2} (ref >59)

## 2022-08-24 LAB — HEMOGLOBIN A1C
Est. average glucose Bld gHb Est-mCnc: 108 mg/dL
Hgb A1c MFr Bld: 5.4 % (ref 4.8–5.6)

## 2022-08-24 LAB — CBC WITH DIFFERENTIAL/PLATELET
Basophils Absolute: 0.1 10*3/uL (ref 0.0–0.2)
Basos: 1 %
EOS (ABSOLUTE): 0.1 10*3/uL (ref 0.0–0.4)
Eos: 1 %
Hematocrit: 40.8 % (ref 34.0–46.6)
Hemoglobin: 12.9 g/dL (ref 11.1–15.9)
Immature Grans (Abs): 0 10*3/uL (ref 0.0–0.1)
Immature Granulocytes: 0 %
Lymphocytes Absolute: 1.7 10*3/uL (ref 0.7–3.1)
Lymphs: 25 %
MCH: 30.4 pg (ref 26.6–33.0)
MCHC: 31.6 g/dL (ref 31.5–35.7)
MCV: 96 fL (ref 79–97)
Monocytes Absolute: 0.5 10*3/uL (ref 0.1–0.9)
Monocytes: 8 %
Neutrophils Absolute: 4.3 10*3/uL (ref 1.4–7.0)
Neutrophils: 65 %
Platelets: 232 10*3/uL (ref 150–450)
RBC: 4.24 x10E6/uL (ref 3.77–5.28)
RDW: 12.4 % (ref 11.7–15.4)
WBC: 6.6 10*3/uL (ref 3.4–10.8)

## 2022-08-24 LAB — MICROALBUMIN / CREATININE URINE RATIO
Creatinine, Urine: 99.8 mg/dL
Microalb/Creat Ratio: 13 mg/g{creat} (ref 0–29)
Microalbumin, Urine: 13.1 ug/mL

## 2022-08-24 LAB — LIPID PANEL
Chol/HDL Ratio: 2.1 ratio (ref 0.0–4.4)
Cholesterol, Total: 146 mg/dL (ref 100–199)
HDL: 70 mg/dL (ref >39)
LDL Chol Calc (NIH): 53 mg/dL (ref 0–99)
Triglycerides: 138 mg/dL (ref 0–149)
VLDL Cholesterol Cal: 23 mg/dL (ref 5–40)

## 2022-08-27 ENCOUNTER — Other Ambulatory Visit: Payer: Self-pay | Admitting: Nurse Practitioner

## 2022-08-27 LAB — CYTOLOGY - PAP
Adequacy: ABSENT
Diagnosis: NEGATIVE

## 2022-08-29 ENCOUNTER — Telehealth: Payer: Self-pay | Admitting: *Deleted

## 2022-08-29 NOTE — Progress Notes (Signed)
  Managed Medicaid  Care Guide Note  08/29/2022 Name: Alexandra Bell MRN: [id number] DOB: 03/19/1965  Alexandra Bell is a 57 y.o. year old female who is a primary care patient of Arnette Felts, FNP.   Alexandra Bell is enrolled in a Managed Medicaid plan: Yes. I reached out to Patient                                              by phone today in response to a referral sent by Ms. Keyarah J Dooly's Arnette Felts, FNP. Outreach attempt today was successful.   Alexandra Bell was given information about care management services as a benefit of their Medicaid health plan.   Patient                                              agreed to services and verbal consent obtained.   Follow up plan: Telephone appointment with care management team member scheduled for:08/31/22 with BSW   [facility] Coordination Care Guide  Direct Dial: [phone]

## 2022-08-29 NOTE — Progress Notes (Signed)
  Managed Medicaid  Care Guide Note  08/29/2022 Name: Alexandra Bell MRN: [id number] DOB: 04-12-1965  Alexandra Bell is a 57 y.o. year old female who is a primary care patient of Alexandra Felts, Alexandra Bell.   Alexandra Bell is enrolled in a Managed Medicaid plan: No. I reached out to Patient                                              by phone today in response to a referral sent by Ms. Alexandra Bell's Alexandra Felts, Alexandra Bell. Outreach attempt today was unsuccesful.   Follow up plan: Unsuccessful telephone outreach attempt made. A HIPPA compliant phone message was left for the patient providing contact information and requesting a return call.  If patient returns call to provider office, please advise to call Care Coordination Care Guide Alexandra Bell at [phone].  [facility]  Care Coordination Care Guide  Direct Dial: [phone]

## 2022-08-31 ENCOUNTER — Other Ambulatory Visit: Payer: Medicaid Other

## 2022-08-31 NOTE — Patient Instructions (Signed)
  Medicaid Managed Care   Unsuccessful Outreach Note  08/31/2022 Name: Alexandra Bell MRN: [id number] DOB: 08-Apr-1965  Referred by: Arnette Felts, FNP Reason for referral : High Risk Managed Medicaid (MM social work unsuccessful telephone outreach )   An unsuccessful telephone outreach was attempted today. The patient was referred to the case management team for assistance with care management and care coordination.   Follow Up Plan: A HIPAA compliant phone message was left for the patient providing contact information and requesting a return call.   Abelino Derrick, MHA [facility]  Managed [facility] Social Worker [phone]

## 2022-08-31 NOTE — Patient Outreach (Signed)
  Medicaid Managed Care   Unsuccessful Outreach Note  08/31/2022 Name: Alexandra Bell MRN: [id number] DOB: 08-Apr-1965  Referred by: Arnette Felts, FNP Reason for referral : High Risk Managed Medicaid (MM social work unsuccessful telephone outreach )   An unsuccessful telephone outreach was attempted today. The patient was referred to the case management team for assistance with care management and care coordination.   Follow Up Plan: A HIPAA compliant phone message was left for the patient providing contact information and requesting a return call.   Abelino Derrick, MHA [facility]  Managed [facility] Social Worker [phone]

## 2022-09-04 ENCOUNTER — Other Ambulatory Visit: Payer: Self-pay | Admitting: Nurse Practitioner

## 2022-09-04 DIAGNOSIS — R7309 Other abnormal glucose: Secondary | ICD-10-CM

## 2022-09-07 ENCOUNTER — Other Ambulatory Visit: Payer: Self-pay | Admitting: Cardiovascular Disease

## 2022-09-07 DIAGNOSIS — I480 Paroxysmal atrial fibrillation: Secondary | ICD-10-CM

## 2022-09-07 NOTE — Telephone Encounter (Signed)
Please review for refill. Thank you! 

## 2022-09-07 NOTE — Telephone Encounter (Signed)
Prescription refill request for Eliquis received. Indication:afib Last office visit:6/24 Scr:0.79  8/24 Age: 57 Weight:112.9  kg  Prescription refilled

## 2022-09-10 ENCOUNTER — Other Ambulatory Visit: Payer: Self-pay | Admitting: Cardiovascular Disease

## 2022-09-10 IMAGING — CR DG CHEST 2V
2 series · 2 of 2 positions shown · non-contrast
Comparison: 11/29/2020

CLINICAL DATA: Status post ICD

EXAM:
CHEST - 2 VIEW

[w chest pa]
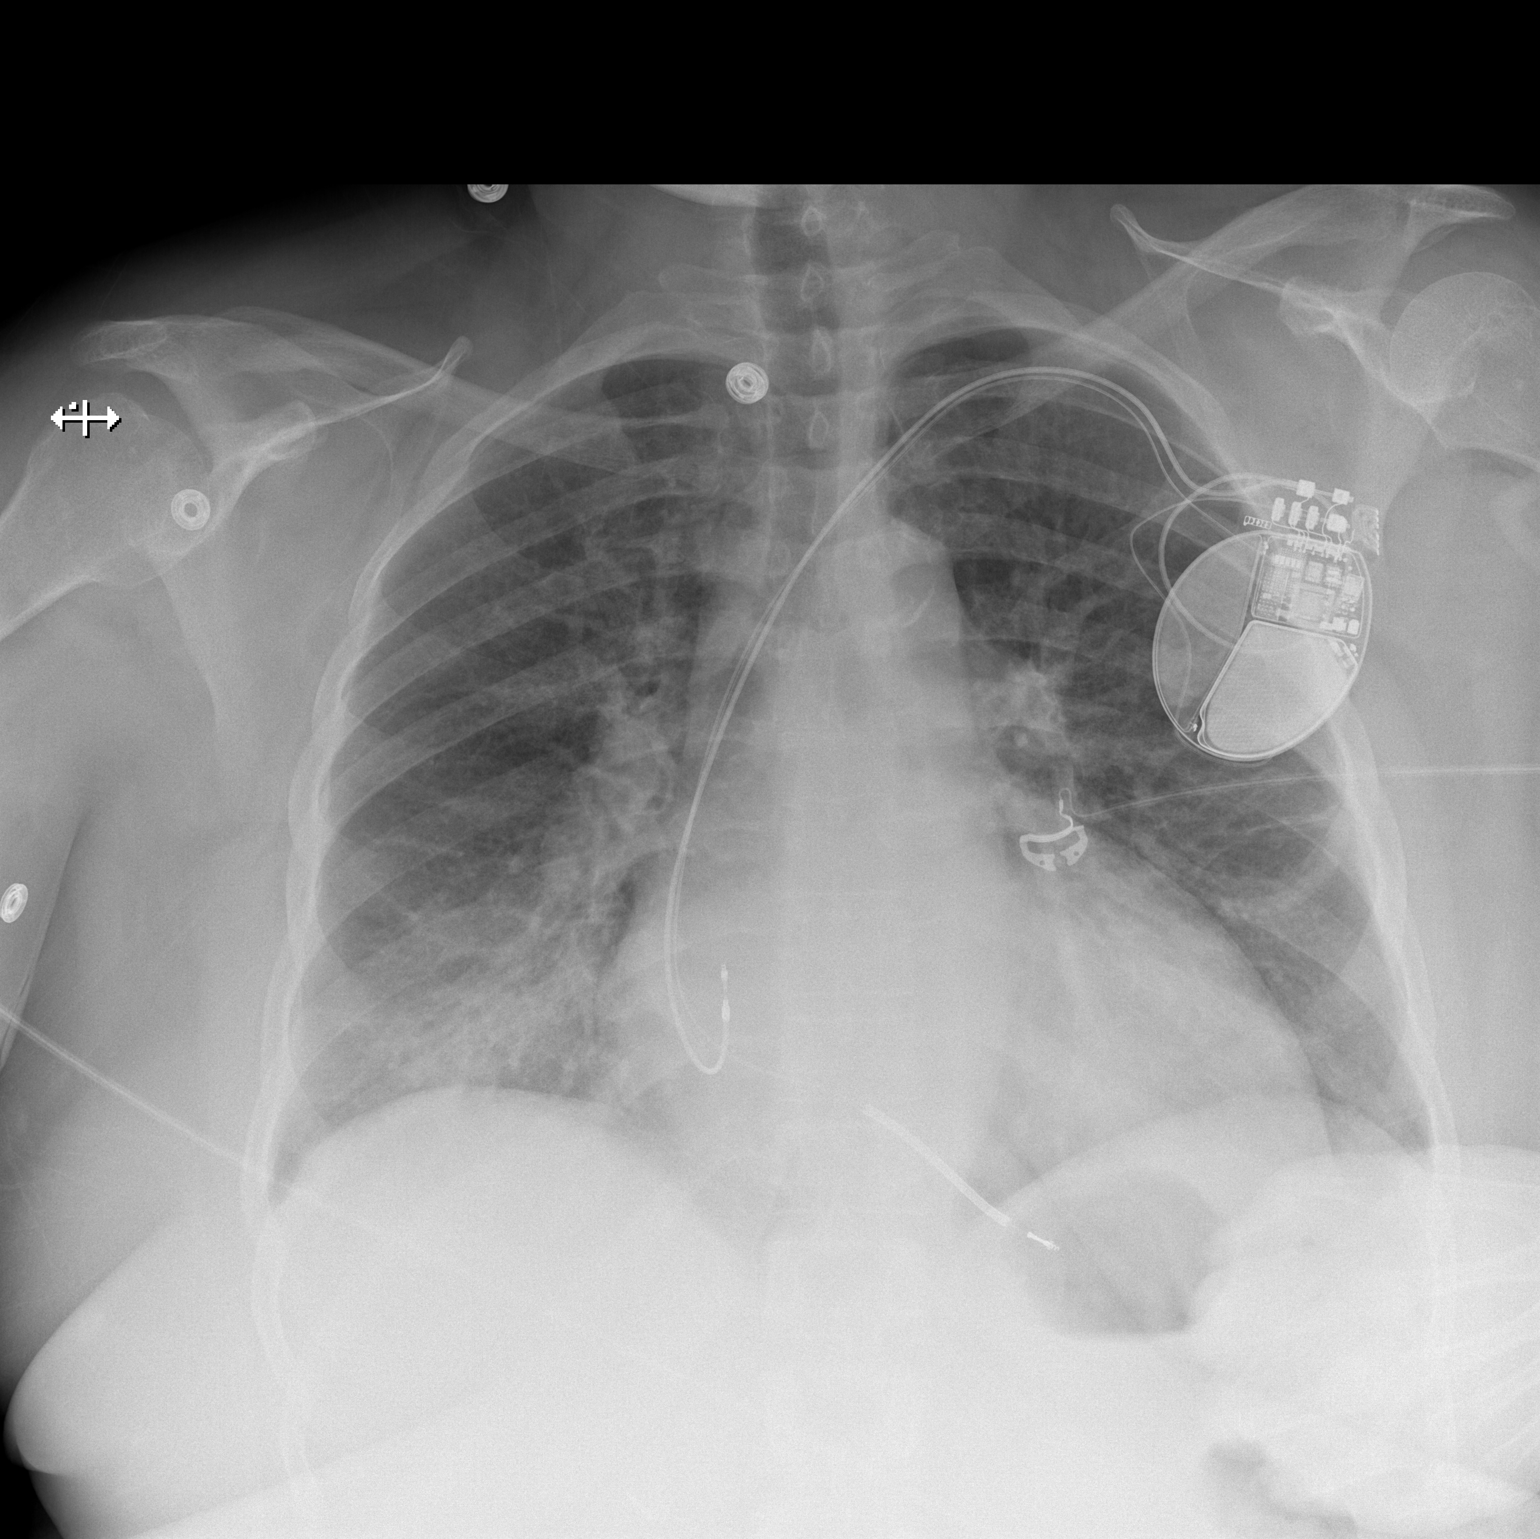

[w chest lat]
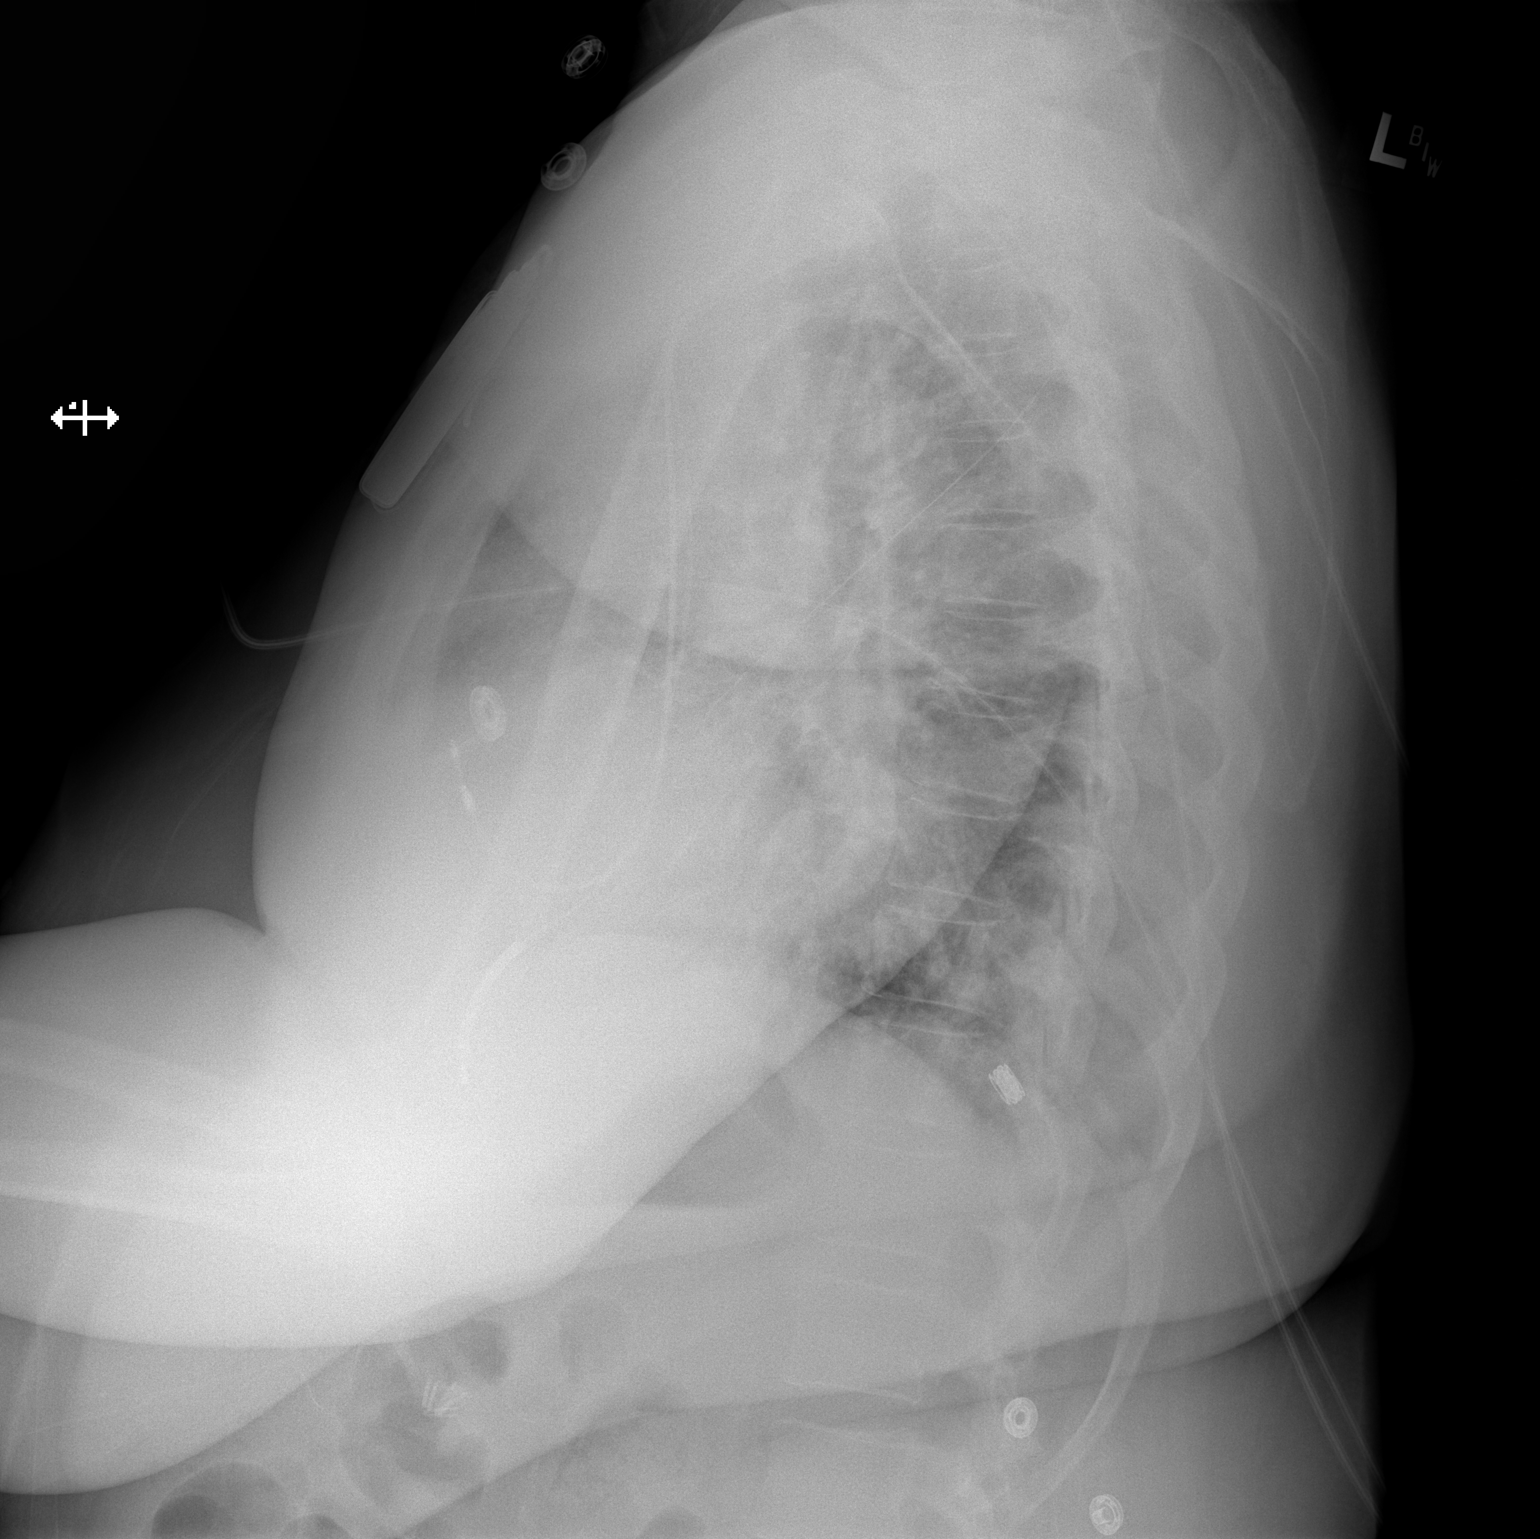

[2 of 2 positions shown; findings below may reference images not displayed]

FINDINGS: Bilateral lower lobe opacities, chronic, favoring scarring. No
pleural effusion or pneumothorax.

The heart is top-normal in size. Left subclavian dual lead ICD in
satisfactory position.

Mild thoracic aortic atherosclerosis.

Visualized osseous structures are within normal limits.
IMPRESSION: Left subclavian dual lead ICD in satisfactory position.

Bilateral lower lobe scarring, chronic.

## 2022-09-11 NOTE — Telephone Encounter (Signed)
Rx request sent to pharmacy.  

## 2022-09-12 ENCOUNTER — Telehealth: Payer: Self-pay | Admitting: Cardiology

## 2022-09-12 NOTE — Telephone Encounter (Signed)
Patient is calling because her device started flashing yesterday evening stating she needs to call the physician. Please advise.

## 2022-09-13 ENCOUNTER — Telehealth: Payer: Self-pay

## 2022-09-13 NOTE — Telephone Encounter (Signed)
LMOVM letting pt know to call [employer] tech support to get help with her monitor.

## 2022-09-13 NOTE — Telephone Encounter (Signed)
I returned Ms.Kann's call but I had to leave another message on her voicemail regarding the referral to our MM BSW.     Weston Settle Care Guide  [facility] Managed  Care Guide [facility]  [phone]

## 2022-09-17 NOTE — Telephone Encounter (Signed)
Transmission received 09/17/2022

## 2022-09-27 ENCOUNTER — Ambulatory Visit (HOSPITAL_COMMUNITY): Payer: Medicaid Other | Attending: Cardiovascular Disease

## 2022-09-27 ENCOUNTER — Ambulatory Visit (INDEPENDENT_AMBULATORY_CARE_PROVIDER_SITE_OTHER): Payer: Medicaid Other

## 2022-09-27 ENCOUNTER — Other Ambulatory Visit: Payer: Medicaid Other

## 2022-09-27 DIAGNOSIS — I421 Obstructive hypertrophic cardiomyopathy: Secondary | ICD-10-CM | POA: Diagnosis not present

## 2022-09-27 DIAGNOSIS — I10 Essential (primary) hypertension: Secondary | ICD-10-CM | POA: Insufficient documentation

## 2022-09-27 LAB — ECHOCARDIOGRAM COMPLETE
Area-P 1/2: 1.85 cm2
S' Lateral: 2.4 cm

## 2022-09-27 LAB — CUP PACEART REMOTE DEVICE CHECK
Battery Remaining Longevity: 138 mo
Battery Remaining Percentage: 100 %
Brady Statistic RA Percent Paced: 12 %
Brady Statistic RV Percent Paced: 1 %
Date Time Interrogation Session: 20240919010100
HighPow Impedance: 80 Ohm
Implantable Lead Connection Status: 753985
Implantable Lead Connection Status: 753985
Implantable Lead Implant Date: 20220318
Implantable Lead Implant Date: 20220318
Implantable Lead Location: 753859
Implantable Lead Location: 753860
Implantable Lead Model: 673
Implantable Lead Model: 7841
Implantable Lead Serial Number: 1122351
Implantable Lead Serial Number: 158410
Implantable Pulse Generator Implant Date: 20220318
Lead Channel Impedance Value: 381 Ohm
Lead Channel Impedance Value: 702 Ohm
Lead Channel Setting Pacing Amplitude: 2 V
Lead Channel Setting Pacing Amplitude: 2.5 V
Lead Channel Setting Pacing Pulse Width: 0.4 ms
Lead Channel Setting Sensing Sensitivity: 0.5 mV
Pulse Gen Serial Number: 593724
Zone Setting Status: 755011

## 2022-09-27 MED ORDER — PERFLUTREN LIPID MICROSPHERE
1.0000 mL | INTRAVENOUS | Status: AC | PRN
Start: 2022-09-27 — End: 2022-09-27
  Administered 2022-09-27: 2 mL via INTRAVENOUS

## 2022-09-27 NOTE — Patient Outreach (Signed)
Medicaid Managed Care Social Work Note  09/27/2022 Name:  Alexandra Bell MRN:  [id number] DOB:  Jan 25, 1965  Alexandra Bell is an 57 y.o. year old female who is a primary patient of Alexandra Felts, FNP.  The Medicaid Managed Care Coordination team was consulted for assistance with:  Community Resources   Alexandra Bell was given information about Medicaid Managed Care Coordination team services today. Alexandra Bell Patient agreed to services and verbal consent obtained.  Engaged with patient  for by telephone forinitial visit in response to referral for case management and/or care coordination services.   Assessments/Interventions:  Review of past medical history, allergies, medications, health status, including review of consultants reports, laboratory and other test data, was performed as part of comprehensive evaluation and provision of chronic care management services.  SDOH: (Social Determinant of Health) assessments and interventions performed: SDOH Interventions    Flowsheet Row Office Visit from 08/23/2022 in [facility]  SDOH Interventions   Depression Interventions/Treatment  PHQ2-9 Score <4 Follow-up Not Indicated     BSW completed a telephone outreach with patient she stated she needs assistance getting around, patient states she works 4 hours at [employer]. BSW asked patient if she needed assistance getting around the home or transportation, patient stated the home, BSW informed she could send her PCP a message to possibly submit a referral for PCS services, patient stated she did not need PCS, but would like a shower chair. Patient states she was just approved for disability and should be getting her first check on 10/16. BSW will send PCP a message about a script for a shower chair. She is behind on her mortgage by 4 months, she does receieve foodstamps and no assistance is needed for utiities.  Advanced Directives Status:  Not addressed in this  encounter.  Care Plan                 No Known Allergies  Medications Reviewed Today   Medications were not reviewed in this encounter     Patient Active Problem List   Diagnosis Date Noted   Encounter for annual health examination 08/23/2022   Mixed hyperlipidemia 08/23/2022   Prediabetes 08/23/2022   Atherosclerosis of aorta (HCC) 08/23/2022   Fall 08/23/2022   Encounter for Papanicolaou smear of cervix 08/23/2022   Encounter for screening mammogram for breast cancer 08/23/2022   Joint pain 06/03/2022   Pain in both knees 03/20/2021   Gastritis and gastroduodenitis    Symptomatic anemia 10/04/2020   Duodenal ulcer    Chronic anticoagulation    Paroxysmal atrial fibrillation (HCC) 09/11/2020   Tobacco use disorder 09/11/2020   Type 2 diabetes mellitus (HCC) 09/11/2020   NSVT (nonsustained ventricular tachycardia) (HCC) 03/10/2020   HOCM (hypertrophic obstructive cardiomyopathy) (HCC) 12/10/2019   Class 3 severe obesity due to excess calories with serious comorbidity and body mass index (BMI) of 40.0 to 44.9 in adult (HCC) 12/10/2019   Eosinophilia 01/16/2011   Colitis, 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   Follow up:  Patient agrees to Care Plan and Follow-up.  Plan: The Managed Medicaid care management team will reach out to the patient again  over the next 30 days.  Date/time of next scheduled Social Work care management/care coordination outreach:  10/29/22  Alexandra Bell, Alexandra Bell, [facility]  Managed [facility] Social Worker [phone]

## 2022-09-27 NOTE — Patient Instructions (Signed)
Visit Information  Alexandra Bell was given information about Medicaid Managed Care team care coordination services as a part of their [employer] Medicaid benefit. Alexandra Bell verbally consentedto engagement with the [facility] Managed Care team.   If you are experiencing a medical emergency, please call 911 or report to your local emergency department or urgent care.   If you have a non-emergency medical problem during routine business hours, please contact your provider's office and ask to speak with a nurse.   For questions related to your [employer] health plan, please call: [phone]  OR visit the member homepage at: [email or web address]  If you would like to schedule transportation through your [facility] plan, please call the following number at least 2 days in advance of your appointment: [phone]  If you are experiencing a behavioral health crisis, call the [facility] Crisis Line at [phone]). The line is available 24 hours a day, seven days a week.  If you would like help to quit smoking, call 1-800-QUIT-NOW ((515) 059-8186) OR Espaol: 1-855-Djelo-Ya (3-235-573-2202) o para ms informacin haga clic aqu or Text READY to 542-706 to register via text   Alexandra Bell - following are the goals we discussed in your visit today:   Goals Addressed   None      Social Worker will follow up in 30 days.   Alexandra Bell, Alexandra Bell, MHA [facility]  Managed Medicaid Social Worker [phone]   Following is a copy of your plan of care:  There are no care plans that you recently modified to display for this patient.

## 2022-10-05 ENCOUNTER — Ambulatory Visit (HOSPITAL_BASED_OUTPATIENT_CLINIC_OR_DEPARTMENT_OTHER): Payer: Medicaid Other | Admitting: Cardiovascular Disease

## 2022-10-05 NOTE — Progress Notes (Incomplete)
Cardiology Office Note:  .    Date:  10/05/2022  ID:  Donzetta Kohut, DOB 03/02/1965, MRN [id number] PCP: Arnette Felts, FNP  [facility] Providers Cardiologist:  Chilton Si, MD Electrophysiologist:  Lanier Prude, MD { Click to update primary MD,subspecialty MD or APP then REFRESH:1}    History of Present Illness: .    Alexandra Bell is a 57 y.o. female  with HOCM status post ICD, paroxysmal atrial fibrillation, hypertension, asthma, GERD, and obesity here for follow up.  She saw her PCP and had LE edema and was noted to have a heart murmur on exam.  She reports that she has had a murmur for many years.  BNP at that time was 451.  She had an echo 11/2019 that revealed LVEF 65 to 70% with severe LVH and grade 2 diastolic dysfunction.  She was referred for cardiac MRI which revealed LVEF 56% with extensive mid wall late gadolinium enhancement in the basal to mid anteroseptum, mid to apical anterior, and mid to apical inferolateral myocardium.  She wore an ambulatory monitor that did reveal episodes of NSVT and PAF.  She was referred to EP for consideration of an ICD which she had implanted on 03/2020.  When she saw her PCP in January BNP was elevated to 893.  She was started on Farxiga.    03/10/2020 her blood pressure was uncontrolled.  Metoprolol switched to carvedilol and amlodipine was increased.  Furosemide was also increased.  She followed up with Edd Fabian 04/2020 and was doing well.  Her blood pressure was high in the office but she had been unable to refill her carvedilol at the pharmacy. On 08/2021 entresto was added, she was working on reducing her smoking. ECHO 09/2021 revealed LVEF 70-75% and grade 1 diastolic dysfunction. Mid cavitary gradient was 4 m/s.   At her visit 03/2022, her home blood pressures had been averaging 130-140 systolic in the setting of running out of Entresto for a week. She was advised to monitor her blood pressure regularly. She was working on  staying active but felt limited by arthritis in her knees. Recommended she engage in water activities for low-impact exercise. She was smoking 4 cigarettes per day, encouraged further smoking cessation efforts. She saw Maxine Glenn, PA-C 06/2022 and was doing well with normal ICD function. She had an echo 09/2022 revealing LVEF 70-75%, severe LVH consistent with HOCM, LVOT peak gradient 60 mmHg, grade 1 diastolic dysfunction, small pericardial effusion, trivial MR, and no aortic stenosis.  Today,  She denies any palpitations, chest pain, shortness of breath, peripheral edema, lightheadedness, headaches, syncope, orthopnea, or PND.  ROS:  Please see the history of present illness. All other systems are reviewed and negative.  (+)  Studies Reviewed: .        Echo  10-23-2022:  1. Midcavitary severe LVH, c/f HOCM. LVOT peak gradient 60 mmHg; no  signficant change with valsalva. No significant change from prior.. Left  ventricular ejection fraction, by estimation, is 70 to 75%. The left  ventricle has hyperdynamic function. The  left ventricle has no regional wall motion abnormalities. There is severe  left ventricular hypertrophy. Left ventricular diastolic parameters are  consistent with Grade I diastolic dysfunction (impaired relaxation).   2. Right ventricular systolic function is normal. The right ventricular  size is normal. There is normal pulmonary artery systolic pressure.   3. A small pericardial effusion is present.   4. The mitral valve is normal in structure. Trivial mitral  valve  regurgitation.   5. The aortic valve is grossly normal. Aortic valve regurgitation is not  visualized. No aortic stenosis is present.   6. The inferior vena cava is normal in size with greater than 50%  respiratory variability, suggesting right atrial pressure of 3 mmHg.   Risk Assessment/Calculations:   {Does this patient have ATRIAL FIBRILLATION?:[phone]} No BP recorded.  {Refresh Note OR Click  here to enter BP  :1}***       Physical Exam:    VS:  There were no vitals taken for this visit. , BMI There is no height or weight on file to calculate BMI. GENERAL:  Well appearing HEENT: Pupils equal round and reactive, fundi not visualized, oral mucosa unremarkable NECK:  No jugular venous distention, waveform within normal limits, carotid upstroke brisk and symmetric, no bruits, no thyromegaly LYMPHATICS:  No cervical adenopathy LUNGS:  Clear to auscultation bilaterally HEART:  RRR.  PMI not displaced or sustained,S1 and S2 within normal limits, no S3, no S4, no clicks, no rubs, *** murmurs ABD:  Flat, positive bowel sounds normal in frequency in pitch, no bruits, no rebound, no guarding, no midline pulsatile mass, no hepatomegaly, no splenomegaly EXT:  2 plus pulses throughout, no edema, no cyanosis no clubbing SKIN:  No rashes no nodules NEURO:  Cranial nerves II through XII grossly intact, motor grossly intact throughout PSYCH:  Cognitively intact, oriented to person place and time  Wt Readings from Last 3 Encounters:  08/23/22 248 lb 12.8 oz (112.9 kg)  06/21/22 255 lb (115.7 kg)  05/17/22 256 lb 12.8 oz (116.5 kg)     ASSESSMENT AND PLAN: .    No problem-specific Assessment & Plan notes found for this encounter.  ***Plan: -     {Are you ordering a CV Procedure (e.g. stress test, cath, DCCV, TEE, etc)?   Press F2        :[id number]}  Dispo:  FU with Tiffany C. Duke Salvia, MD, [facility] in ***  I,Mathew Stumpf,acting as a scribe for Chilton Si, MD.,have documented all relevant documentation on the behalf of Chilton Si, MD,as directed by  Chilton Si, MD while in the presence of Chilton Si, MD.  ***  Signed, Carlena Bjornstad

## 2022-10-09 NOTE — Progress Notes (Signed)
Remote ICD transmission.   

## 2022-10-26 ENCOUNTER — Other Ambulatory Visit: Payer: Self-pay | Admitting: Nurse Practitioner

## 2022-10-29 ENCOUNTER — Other Ambulatory Visit: Payer: Self-pay | Admitting: Cardiovascular Disease

## 2022-10-29 ENCOUNTER — Other Ambulatory Visit: Payer: Self-pay

## 2022-10-29 DIAGNOSIS — I421 Obstructive hypertrophic cardiomyopathy: Secondary | ICD-10-CM

## 2022-10-29 DIAGNOSIS — I10 Essential (primary) hypertension: Secondary | ICD-10-CM

## 2022-10-29 NOTE — Patient Instructions (Signed)
Medicaid Managed Care   Unsuccessful Outreach Note  10/29/2022 Name: Alexandra Bell MRN: [id number] DOB: December 21, 1965  Referred by: Arnette Felts, FNP Reason for referral : High Risk Managed Medicaid (MM Social work  unsuccesful telephone outreach )   An unsuccessful telephone outreach was attempted today. The patient was referred to the case management team for assistance with care management and care coordination.   Follow Up Plan: The care management team will reach out to the patient again over the next 30 days.   Abelino Derrick, MHA [facility]  Managed [facility] Social Worker [phone]

## 2022-10-29 NOTE — Patient Outreach (Signed)
Medicaid Managed Care   Unsuccessful Outreach Note  10/29/2022 Name: Alexandra Bell MRN: [id number] DOB: December 21, 1965  Referred by: Arnette Felts, FNP Reason for referral : High Risk Managed Medicaid (MM Social work  unsuccesful telephone outreach )   An unsuccessful telephone outreach was attempted today. The patient was referred to the case management team for assistance with care management and care coordination.   Follow Up Plan: The care management team will reach out to the patient again over the next 30 days.   Abelino Derrick, MHA [facility]  Managed [facility] Social Worker [phone]

## 2022-11-02 ENCOUNTER — Other Ambulatory Visit: Payer: Self-pay | Admitting: Nurse Practitioner

## 2022-11-02 DIAGNOSIS — M79641 Pain in right hand: Secondary | ICD-10-CM

## 2022-11-02 DIAGNOSIS — M545 Low back pain, unspecified: Secondary | ICD-10-CM

## 2022-11-05 ENCOUNTER — Telehealth: Payer: Self-pay

## 2022-11-05 NOTE — Telephone Encounter (Signed)
..  Medicaid Managed Care   Unsuccessful Outreach Note  11/05/2022 Name: Alexandra Bell MRN: [id number] DOB: [birth year]  Referred by: Arnette Felts, FNP Reason for referral : Appointment (I called the patient today to get her missed phone appointment with the MM BSW rescheduled. She did not answer and I was not able to leave a message.)   A second unsuccessful telephone outreach was attempted today. The patient was referred to the case management team for assistance with care management and care coordination.   Follow Up Plan: The care management team will reach out to the patient again over the next 7 days.   Weston Settle Care Guide  [facility] Managed  Care Guide [facility]  [phone]

## 2022-11-12 ENCOUNTER — Telehealth: Payer: Self-pay

## 2022-11-12 NOTE — Telephone Encounter (Signed)
..   Medicaid Managed Care   Unsuccessful Outreach Note  11/12/2022 Name: Alexandra Bell MRN: [id number] DOB: [birth year]  Referred by: Arnette Felts, FNP Reason for referral : Appointment   Third unsuccessful telephone outreach was attempted today. The patient was referred to the case management team for assistance with care management and care coordination. The patient's primary care provider has been notified of our unsuccessful attempts to make or maintain contact with the patient. The care management team is pleased to engage with this patient at any time in the future should he/she be interested in assistance from the care management team.   Follow Up Plan: We have been unable to make contact with the patient for follow up. The care management team is available to follow up with the patient after provider conversation with the patient regarding recommendation for care management engagement and subsequent re-referral to the care management team.   Weston Settle Care Guide  [facility] Managed  Care Guide [facility]  [phone]

## 2022-11-13 ENCOUNTER — Other Ambulatory Visit: Payer: Self-pay | Admitting: Nurse Practitioner

## 2022-11-13 DIAGNOSIS — Z8709 Personal history of other diseases of the respiratory system: Secondary | ICD-10-CM

## 2022-11-15 DIAGNOSIS — M170 Bilateral primary osteoarthritis of knee: Secondary | ICD-10-CM | POA: Diagnosis not present

## 2022-11-19 ENCOUNTER — Other Ambulatory Visit: Payer: Self-pay | Admitting: Nurse Practitioner

## 2022-12-05 ENCOUNTER — Other Ambulatory Visit: Payer: Self-pay | Admitting: Nurse Practitioner

## 2022-12-05 DIAGNOSIS — R7309 Other abnormal glucose: Secondary | ICD-10-CM

## 2022-12-07 ENCOUNTER — Other Ambulatory Visit: Payer: Self-pay | Admitting: Cardiovascular Disease

## 2022-12-27 ENCOUNTER — Ambulatory Visit (INDEPENDENT_AMBULATORY_CARE_PROVIDER_SITE_OTHER): Payer: Medicaid Other

## 2022-12-27 DIAGNOSIS — I421 Obstructive hypertrophic cardiomyopathy: Secondary | ICD-10-CM

## 2022-12-27 LAB — CUP PACEART REMOTE DEVICE CHECK
Battery Remaining Longevity: 138 mo
Battery Remaining Percentage: 100 %
Brady Statistic RA Percent Paced: 11 %
Brady Statistic RV Percent Paced: 1 %
Date Time Interrogation Session: 20241219005100
HighPow Impedance: 73 Ohm
Implantable Lead Connection Status: 753985
Implantable Lead Connection Status: 753985
Implantable Lead Implant Date: 20220318
Implantable Lead Implant Date: 20220318
Implantable Lead Location: 753859
Implantable Lead Location: 753860
Implantable Lead Model: 673
Implantable Lead Model: 7841
Implantable Lead Serial Number: 1122351
Implantable Lead Serial Number: 158410
Implantable Pulse Generator Implant Date: 20220318
Lead Channel Impedance Value: 376 Ohm
Lead Channel Impedance Value: 569 Ohm
Lead Channel Setting Pacing Amplitude: 2 V
Lead Channel Setting Pacing Amplitude: 2.5 V
Lead Channel Setting Pacing Pulse Width: 0.4 ms
Lead Channel Setting Sensing Sensitivity: 0.5 mV
Pulse Gen Serial Number: 593724
Zone Setting Status: 755011

## 2023-01-03 ENCOUNTER — Other Ambulatory Visit: Payer: Self-pay | Admitting: Cardiovascular Disease

## 2023-01-25 ENCOUNTER — Other Ambulatory Visit: Payer: Self-pay | Admitting: Nurse Practitioner

## 2023-01-28 ENCOUNTER — Other Ambulatory Visit: Payer: Self-pay | Admitting: Cardiovascular Disease

## 2023-01-30 NOTE — Progress Notes (Signed)
Remote ICD transmission.   

## 2023-02-02 ENCOUNTER — Other Ambulatory Visit: Payer: Self-pay | Admitting: Nurse Practitioner

## 2023-02-02 DIAGNOSIS — M79642 Pain in left hand: Secondary | ICD-10-CM

## 2023-02-02 DIAGNOSIS — G8929 Other chronic pain: Secondary | ICD-10-CM

## 2023-02-04 ENCOUNTER — Other Ambulatory Visit: Payer: Self-pay | Admitting: Nurse Practitioner

## 2023-02-04 DIAGNOSIS — M79642 Pain in left hand: Secondary | ICD-10-CM

## 2023-02-04 DIAGNOSIS — M545 Low back pain, unspecified: Secondary | ICD-10-CM

## 2023-02-15 ENCOUNTER — Other Ambulatory Visit: Payer: Self-pay | Admitting: Nurse Practitioner

## 2023-02-27 ENCOUNTER — Telehealth: Payer: Self-pay | Admitting: Nurse Practitioner

## 2023-02-27 NOTE — Telephone Encounter (Signed)
CALLED PT TO SEE IF SHE WAS ABLE TO COME IN TODAY 2/19 @ 12:20 NO ANSWER LEFT VM

## 2023-02-28 ENCOUNTER — Ambulatory Visit: Payer: Medicare HMO | Admitting: Nurse Practitioner

## 2023-03-02 ENCOUNTER — Other Ambulatory Visit: Payer: Self-pay | Admitting: Nurse Practitioner

## 2023-03-02 DIAGNOSIS — R7309 Other abnormal glucose: Secondary | ICD-10-CM

## 2023-03-04 ENCOUNTER — Other Ambulatory Visit: Payer: Self-pay | Admitting: Nurse Practitioner

## 2023-03-05 ENCOUNTER — Encounter (HOSPITAL_BASED_OUTPATIENT_CLINIC_OR_DEPARTMENT_OTHER): Payer: Self-pay

## 2023-03-07 ENCOUNTER — Ambulatory Visit (HOSPITAL_BASED_OUTPATIENT_CLINIC_OR_DEPARTMENT_OTHER): Payer: Medicare HMO | Admitting: Cardiovascular Disease

## 2023-03-11 ENCOUNTER — Other Ambulatory Visit: Payer: Self-pay | Admitting: Cardiovascular Disease

## 2023-03-11 ENCOUNTER — Other Ambulatory Visit: Payer: Self-pay | Admitting: Nurse Practitioner

## 2023-03-11 DIAGNOSIS — I480 Paroxysmal atrial fibrillation: Secondary | ICD-10-CM

## 2023-03-12 NOTE — Telephone Encounter (Signed)
 Prescription refill request for Eliquis received. Indication:afib Last office visit:6/24 Scr:0.79  8/24 Age: 58 Weight:112.9  kg  Prescription refilled

## 2023-03-14 ENCOUNTER — Ambulatory Visit: Payer: Medicare HMO | Admitting: Nurse Practitioner

## 2023-03-14 ENCOUNTER — Encounter: Payer: Self-pay | Admitting: Nurse Practitioner

## 2023-03-14 VITALS — BP 130/70 | HR 79 | Temp 99.2°F | Ht 65.0 in | Wt 258.4 lb

## 2023-03-14 DIAGNOSIS — I10 Essential (primary) hypertension: Secondary | ICD-10-CM

## 2023-03-14 DIAGNOSIS — I421 Obstructive hypertrophic cardiomyopathy: Secondary | ICD-10-CM

## 2023-03-14 DIAGNOSIS — Z6841 Body Mass Index (BMI) 40.0 and over, adult: Secondary | ICD-10-CM

## 2023-03-14 DIAGNOSIS — I480 Paroxysmal atrial fibrillation: Secondary | ICD-10-CM | POA: Diagnosis not present

## 2023-03-14 DIAGNOSIS — Z8709 Personal history of other diseases of the respiratory system: Secondary | ICD-10-CM

## 2023-03-14 DIAGNOSIS — E66813 Obesity, class 3: Secondary | ICD-10-CM | POA: Diagnosis not present

## 2023-03-14 DIAGNOSIS — I700 Atherosclerosis of aorta: Secondary | ICD-10-CM | POA: Diagnosis not present

## 2023-03-14 DIAGNOSIS — Z23 Encounter for immunization: Secondary | ICD-10-CM

## 2023-03-14 DIAGNOSIS — E782 Mixed hyperlipidemia: Secondary | ICD-10-CM | POA: Diagnosis not present

## 2023-03-14 DIAGNOSIS — Z95811 Presence of heart assist device: Secondary | ICD-10-CM

## 2023-03-14 DIAGNOSIS — R7303 Prediabetes: Secondary | ICD-10-CM

## 2023-03-14 DIAGNOSIS — Z79899 Other long term (current) drug therapy: Secondary | ICD-10-CM

## 2023-03-14 MED ORDER — EMPAGLIFLOZIN 10 MG PO TABS: 10.0000 mg | ORAL_TABLET | Freq: Every day | ORAL | 1 refills | Status: DC

## 2023-03-14 MED ORDER — DULERA 100-5 MCG/ACT IN AERO: 2.0000 | INHALATION_SPRAY | Freq: Two times a day (BID) | RESPIRATORY_TRACT | 5 refills | Status: DC

## 2023-03-14 NOTE — Patient Instructions (Addendum)
 [facility] [address] [place] [address]  [phone]  Hypertension, Adult Hypertension is another name for high blood pressure. High blood pressure forces your heart to work harder to pump blood. This can cause problems over time. There are two numbers in a blood pressure reading. There is a top number (systolic) over a bottom number (diastolic). It is best to have a blood pressure that is below 120/80. What are the causes? The cause of this condition is not known. Some other conditions can lead to high blood pressure. What increases the risk? Some lifestyle factors can make you more likely to develop high blood pressure: Smoking. Not getting enough exercise or physical activity. Being overweight. Having too much fat, sugar, calories, or salt (sodium) in your diet. Drinking too much alcohol. Other risk factors include: Having any of these conditions: Heart disease. Diabetes. High cholesterol. Kidney disease. Obstructive sleep apnea. Having a family history of high blood pressure and high cholesterol. Age. The risk increases with age. Stress. What are the signs or symptoms? High blood pressure may not cause symptoms. Very high blood pressure (hypertensive crisis) may cause: Headache. Fast or uneven heartbeats (palpitations). Shortness of breath. Nosebleed. Vomiting or feeling like you may vomit (nauseous). Changes in how you see. Very bad chest pain. Feeling dizzy. Seizures. How is this treated? This condition is treated by making healthy lifestyle changes, such as: Eating healthy foods. Exercising more. Drinking less alcohol. Your doctor may prescribe medicine if lifestyle changes do not help enough and if: Your top number is above 130. Your bottom number is above 80. Your personal target blood pressure may vary. Follow these instructions at home: Eating and drinking  If told, follow the DASH eating plan. To follow this plan: Fill one  half of your plate at each meal with fruits and vegetables. Fill one fourth of your plate at each meal with whole grains. Whole grains include whole-wheat pasta, brown rice, and whole-grain bread. Eat or drink low-fat dairy products, such as skim milk or low-fat yogurt. Fill one fourth of your plate at each meal with low-fat (lean) proteins. Low-fat proteins include fish, chicken without skin, eggs, beans, and tofu. Avoid fatty meat, cured and processed meat, or chicken with skin. Avoid pre-made or processed food. Limit the amount of salt in your diet to less than 1,500 mg each day. Do not drink alcohol if: Your doctor tells you not to drink. You are pregnant, may be pregnant, or are planning to become pregnant. If you drink alcohol: Limit how much you have to: 0-1 drink a day for women. 0-2 drinks a day for men. Know how much alcohol is in your drink. In the [place] one drink equals one 12 oz bottle of beer (355 mL), one 5 oz glass of wine (148 mL), or one 1 oz glass of hard liquor (44 mL). Lifestyle  Work with your doctor to stay at a healthy weight or to lose weight. Ask your doctor what the best weight is for you. Get at least 30 minutes of exercise that causes your heart to beat faster (aerobic exercise) most days of the week. This may include walking, swimming, or biking. Get at least 30 minutes of exercise that strengthens your muscles (resistance exercise) at least 3 days a week. This may include lifting weights or doing Pilates. Do not smoke or use any products that contain nicotine or tobacco. If you need help quitting, ask your doctor. Check your blood pressure at  home as told by your doctor. Keep all follow-up visits. Medicines Take over-the-counter and prescription medicines only as told by your doctor. Follow directions carefully. Do not skip doses of blood pressure medicine. The medicine does not work as well if you skip doses. Skipping doses also puts you at risk for  problems. Ask your doctor about side effects or reactions to medicines that you should watch for. Contact a doctor if: You think you are having a reaction to the medicine you are taking. You have headaches that keep coming back. You feel dizzy. You have swelling in your ankles. You have trouble with your vision. Get help right away if: You get a very bad headache. You start to feel mixed up (confused). You feel weak or numb. You feel faint. You have very bad pain in your: Chest. Belly (abdomen). You vomit more than once. You have trouble breathing. These symptoms may be an emergency. Get help right away. Call 911. Do not wait to see if the symptoms will go away. Do not drive yourself to the hospital. Summary Hypertension is another name for high blood pressure. High blood pressure forces your heart to work harder to pump blood. For most people, a normal blood pressure is less than 120/80. Making healthy choices can help lower blood pressure. If your blood pressure does not get lower with healthy choices, you may need to take medicine. This information is not intended to replace advice given to you by your health care provider. Make sure you discuss any questions you have with your health care provider. Document Revised: 10/13/2020 Document Reviewed: 10/13/2020 Elsevier Patient Education  2024 [employer].

## 2023-03-14 NOTE — Progress Notes (Signed)
 Madelaine Bhat, CMA,acting as a Neurosurgeon for Arnette Felts, FNP.,have documented all relevant documentation on the behalf of Arnette Felts, FNP,as directed by  Arnette Felts, FNP while in the presence of Arnette Felts, FNP.  Subjective:  Patient ID: Alexandra Bell , female    DOB: 04-01-1965 , 58 y.o.   MRN: [id number]  Chief Complaint  Patient presents with   Hypertension    HPI  Patient presents today for a bp and pre dm follow up, Patient reports compliance with medication. Patient denies any chest pain, SOB, or headaches. Patient has no concerns today.      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   Heart attack Father    Diabetes Sister      Current Outpatient Medications:    albuterol (PROVENTIL) (2.5 MG/3ML) 0.083% nebulizer solution, Inhale 3 mL 3 times a day by nebulization route as needed for 30 days., Disp: 75 mL, Rfl: 3   albuterol (VENTOLIN HFA) 108 (90 Base) MCG/ACT inhaler, Inhale 2 puffs into the lungs every 6 (six) hours as needed. For asthma, Disp: 1 each, Rfl: 3   amLODipine  (NORVASC) 10 MG tablet, Take 1 tablet by mouth once daily, Disp: 90 tablet, Rfl: 3   apixaban (ELIQUIS) 5 MG TABS tablet, Take 1 tablet by mouth twice daily, Disp: 180 tablet, Rfl: 1   atorvastatin (LIPITOR) 20 MG tablet, Take 1 tablet (20 mg total) by mouth daily., Disp: 30 tablet, Rfl: 11   carvedilol (COREG) 25 MG tablet, Take 1 tablet by mouth twice daily, Disp: 180 tablet, Rfl: 1   diclofenac Sodium (VOLTAREN) 1 % GEL, Apply 2 g topically daily as needed (For pain)., Disp: 50 g, Rfl: 1   furosemide (LASIX) 80 MG tablet, Take 1 tablet by mouth once daily, Disp: 90 tablet, Rfl: 3   gabapentin (NEURONTIN) 300 MG capsule, Take 1 capsule by mouth twice daily, Disp: 180 capsule, Rfl: 0   guaiFENesin-dextromethorphan (ROBITUSSIN DM) 100-10 MG/5ML syrup, Take 5 mLs by mouth every 4 (four) hours as needed for cough., Disp: 118 mL, Rfl: 0   KLOR-CON M20 20 MEQ tablet, Take 1 tablet by mouth once daily, Disp: 90 tablet, Rfl: 0   Magnesium 250 MG TABS, Take 250 mg by mouth daily at 2 PM., Disp: , Rfl:    melatonin 5 MG TABS, Take 5 mg by mouth daily at 2 PM., Disp: , Rfl:  meloxicam (MOBIC) 15 MG tablet, Take 15 mg by mouth daily as needed., Disp: , Rfl:    metFORMIN (GLUCOPHAGE) 500 MG tablet, TAKE 1 TABLET BY MOUTH TWICE DAILY WITH A MEAL, Disp: 180 tablet, Rfl: 0   Multiple Vitamin (MULTIVITAMIN WITH MINERALS) TABS tablet, Take 1 tablet by mouth daily., Disp: , Rfl:    pantoprazole (PROTONIX) 40 MG tablet, Take 1 tablet by mouth twice daily, Disp: 180 tablet, Rfl: 0   sacubitril-valsartan (ENTRESTO) 24-26 MG, Take 1 tablet by mouth 2 (two) times daily., Disp: 28 tablet, Rfl: 0   sacubitril-valsartan (ENTRESTO) 24-26 MG, Take 1 tablet by mouth twice daily, Disp: 180 tablet, Rfl: 3   empagliflozin (JARDIANCE) 10 MG TABS tablet, Take 1 tablet (10 mg total) by mouth daily., Disp: 90 tablet, Rfl: 1   ferrous sulfate 325 (65 FE) MG EC tablet, Take 1 tablet (325 mg total) by mouth 2 (two) times daily with a  meal., Disp: 180 tablet, Rfl: 2   mometasone-formoterol (DULERA) 100-5 MCG/ACT AERO, Inhale 2 puffs into the lungs 2 (two) times daily., Disp: 13 g, Rfl: 5   No Known Allergies   Review of Systems  Constitutional: Negative.   Eyes: Negative.   Musculoskeletal: Negative.   Skin: Negative.   Psychiatric/Behavioral: Negative.       Today's Vitals   03/14/23 1157  BP: 130/70  Pulse: 79  Temp: 99.2 F (37.3 C)  Weight: 258 lb 6.4 oz (117.2 kg)  Height: 5\' 5"  (1.651 m)  PainSc: 0-No pain   Body mass index is 43 kg/m.  Wt Readings from Last 3 Encounters:  03/14/23 258 lb 6.4 oz (117.2 kg)  08/23/22 248 lb 12.8 oz (112.9 kg)  06/21/22 255 lb (115.7 kg)    The 10-year ASCVD risk score (Arnett DK, et al., 2019) is: 18.9%   Values used to calculate the score:     Age: 6 years     Sex: Female     Is Non-Hispanic African American: Yes     Diabetic: Yes     Tobacco smoker: Yes     Systolic Blood Pressure: 130 mmHg     Is BP treated: Yes     HDL Cholesterol: 70 mg/dL     Total Cholesterol: 146 mg/dL  Objective:  Physical Exam Vitals and nursing note reviewed.  Constitutional:      General: She is not in acute distress.    Appearance: Normal appearance. She is well-developed. She is obese.  Cardiovascular:     Rate and Rhythm: Normal rate and regular rhythm.     Pulses:          Dorsalis pedis pulses are 1+ on the right side and 1+ on the left side.     Heart sounds: Normal heart sounds. No murmur heard. Pulmonary:     Effort: Pulmonary effort is normal. No respiratory distress.     Breath sounds: Normal breath sounds. No wheezing.  Musculoskeletal:        General: No tenderness. Normal range of motion.     Right hand: No tenderness.     Left hand: No tenderness.     Right lower leg: No edema.     Left lower leg: No edema.     Comments: Using cane for ambulation  Skin:    General: Skin is warm and dry.     Capillary Refill: Capillary refill takes less than 2 seconds.      Comments: .   Neurological:  General: No focal deficit present.     Mental Status: She is alert and oriented to person, place, and time.     Cranial Nerves: No cranial nerve deficit.     Motor: No weakness.  Psychiatric:        Mood and Affect: Mood normal.        Behavior: Behavior normal.        Thought Content: Thought content normal.        Judgment: Judgment normal.     Assessment And Plan:  Essential hypertension Assessment & Plan: Blood pressure is well controlled, continue current medications. Continue f/u with Cardiology.   Orders: -     CMP14+EGFR  Mixed hyperlipidemia Assessment & Plan: Cholesterol levels are stable. Continue statin, tolerating well.   Orders: -     Hemoglobin A1c  Prediabetes Assessment & Plan: HgbA1c is normal, will recheck today.   Orders: -     Hemoglobin A1c  Atherosclerosis of aorta (HCC) Assessment & Plan: Continue statin. Tolerating well    Paroxysmal atrial fibrillation (HCC) Assessment & Plan: Continue current medications and f/u with Cardiology   HOCM (hypertrophic obstructive cardiomyopathy) (HCC) Assessment & Plan: Stable, continue f/u with Cardiology  Orders: -     Empagliflozin; Take 1 tablet (10 mg total) by mouth daily.  Dispense: 90 tablet; Refill: 1  Need for pneumococcal 20-valent conjugate vaccination Assessment & Plan: Prevnar 20 given in office  Orders: -     Pneumococcal conjugate vaccine 20-valent  Class 3 severe obesity due to excess calories with serious comorbidity and body mass index (BMI) of 40.0 to 44.9 in adult [facility]) Assessment & Plan: She is encouraged to strive for BMI less than 30 to decrease cardiac risk. Advised to aim for at least 150 minutes of exercise per week.     Other long term (current) drug therapy -     CBC  History of asthma -     Dulera; Inhale 2 puffs into the lungs 2 (two) times daily.  Dispense: 13 g; Refill: 5  Presence of heart assist device  (HCC)    Return for Uncontrolled BP check-3/4 months.  Patient was given opportunity to ask questions. Patient verbalized understanding of the plan and was able to repeat key elements of the plan. All questions were answered to their satisfaction.    Jeanell Sparrow, FNP, have reviewed all documentation for this visit. The documentation on 03/14/23 for the exam, diagnosis, procedures, and orders are all accurate and complete.   IF YOU HAVE BEEN REFERRED TO A SPECIALIST, IT MAY TAKE 1-2 WEEKS TO SCHEDULE/PROCESS THE REFERRAL. IF YOU HAVE NOT HEARD FROM US/SPECIALIST IN TWO WEEKS, PLEASE GIVE [place] A CALL AT [phone].

## 2023-03-15 LAB — CBC
Hematocrit: 39.1 % (ref 34.0–46.6)
Hemoglobin: 12.7 g/dL (ref 11.1–15.9)
MCH: 31.8 pg (ref 26.6–33.0)
MCHC: 32.5 g/dL (ref 31.5–35.7)
MCV: 98 fL — ABNORMAL HIGH (ref 79–97)
Platelets: 246 10*3/uL (ref 150–450)
RBC: 3.99 x10E6/uL (ref 3.77–5.28)
RDW: 11.8 % (ref 11.7–15.4)
WBC: 9.3 10*3/uL (ref 3.4–10.8)

## 2023-03-15 LAB — CMP14+EGFR
ALT: 11 IU/L (ref 0–32)
AST: 16 IU/L (ref 0–40)
Albumin: 4.1 g/dL (ref 3.8–4.9)
Alkaline Phosphatase: 65 IU/L (ref 44–121)
BUN/Creatinine Ratio: 13 (ref 9–23)
BUN: 9 mg/dL (ref 6–24)
Bilirubin Total: <0.2 mg/dL (ref 0.0–1.2)
CO2: 27 mmol/L (ref 20–29)
Calcium: 10.2 mg/dL (ref 8.7–10.2)
Chloride: 105 mmol/L (ref 96–106)
Creatinine, Ser: 0.68 mg/dL (ref 0.57–1.00)
Globulin, Total: 2 g/dL (ref 1.5–4.5)
Glucose: 123 mg/dL — ABNORMAL HIGH (ref 70–99)
Potassium: 4.4 mmol/L (ref 3.5–5.2)
Sodium: 144 mmol/L (ref 134–144)
Total Protein: 6.1 g/dL (ref 6.0–8.5)
eGFR: 100 mL/min/{1.73_m2} (ref >59)

## 2023-03-15 LAB — HEMOGLOBIN A1C
Est. average glucose Bld gHb Est-mCnc: 103 mg/dL
Hgb A1c MFr Bld: 5.2 % (ref 4.8–5.6)

## 2023-03-22 IMAGING — CT CT ABD-PELV W/O CM
2 of 4 series · 16 of 46 positions shown, 18 images · non-contrast
Comparison: CT abdomen/pelvis 11/28/2010

CLINICAL DATA: GI bleed, recurrent anemia on Eliquis

EXAM:
CT ABDOMEN AND PELVIS WITHOUT CONTRAST
TECHNIQUE: Multidetector CT imaging of the abdomen and pelvis was performed
following the standard protocol without IV contrast.

[Series 3: a/p w/o 5mm (person_name) · axial · non-contrast · 0.90mm/px · z∈[+1137,+1547]mm · 13 of 96 slices shown, 15 images]
[im 7/96  soft-tissue]
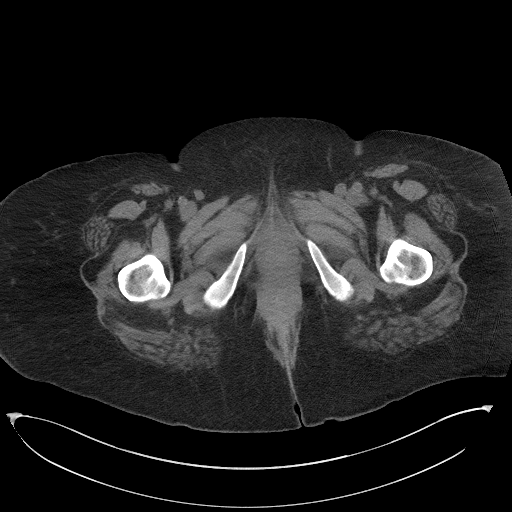
[im 7/96  bone]
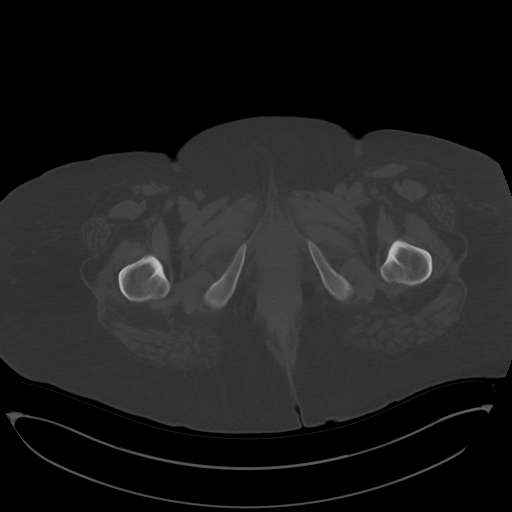
[im 13/96  soft-tissue]
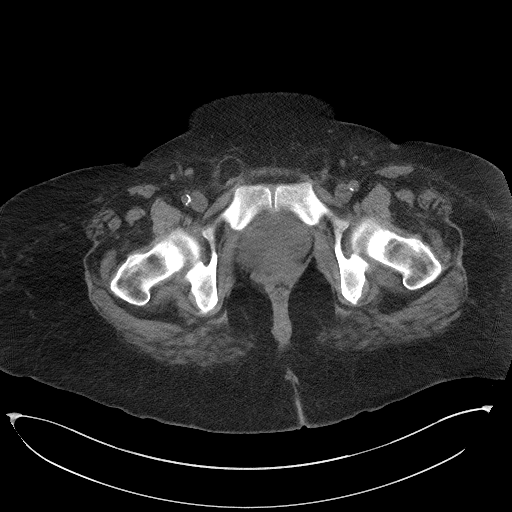
[im 20/96  soft-tissue]
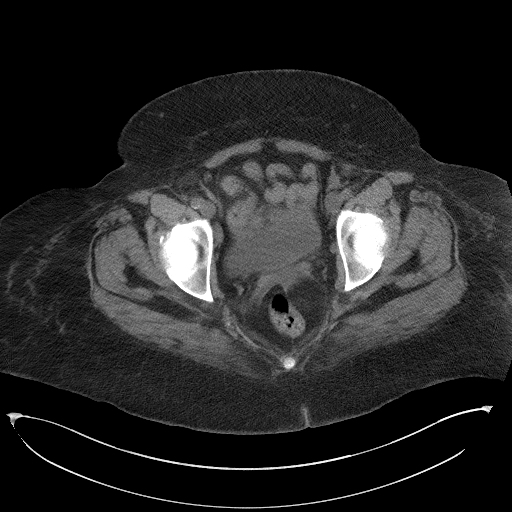
[im 26/96  soft-tissue]
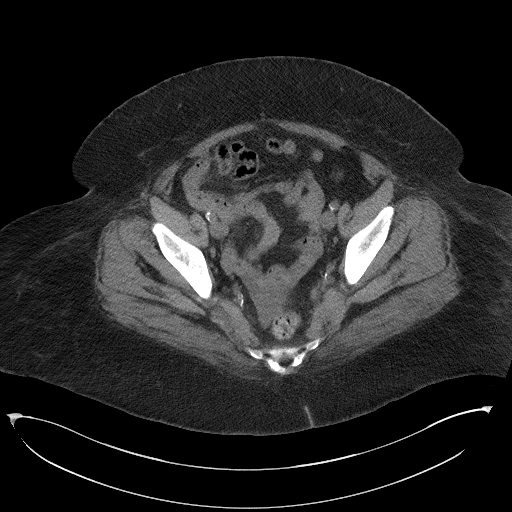
[im 32/96  soft-tissue]
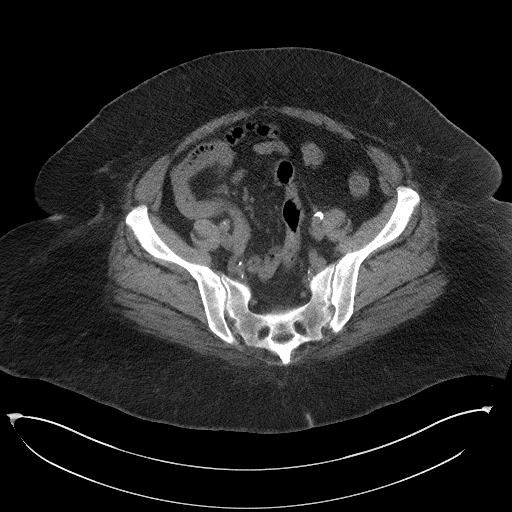
[im 39/96  soft-tissue]
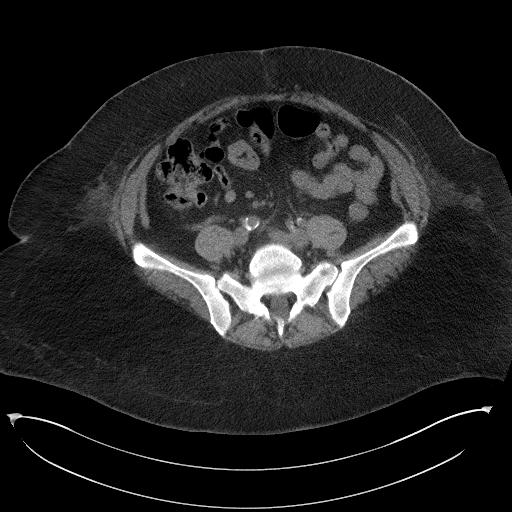
[im 51/96  soft-tissue]
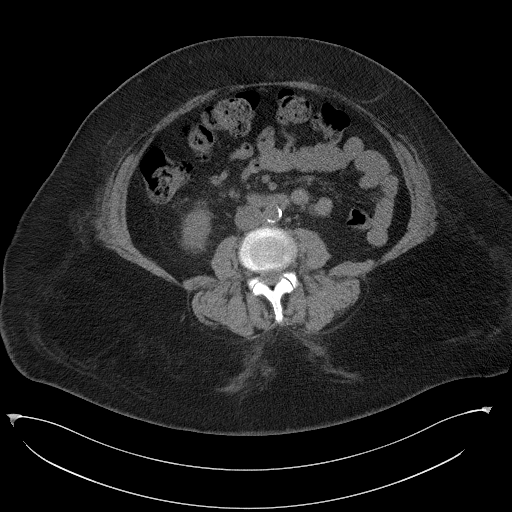
[im 58/96  soft-tissue]
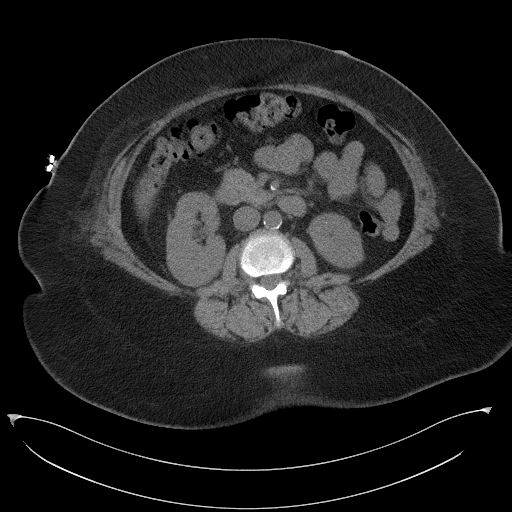
[im 64/96  soft-tissue]
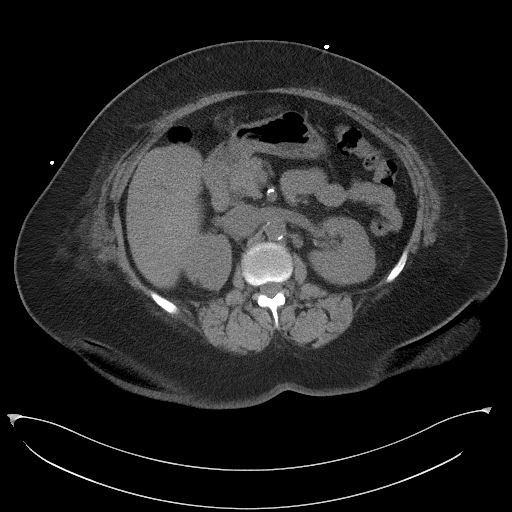
[im 64/96  bone]
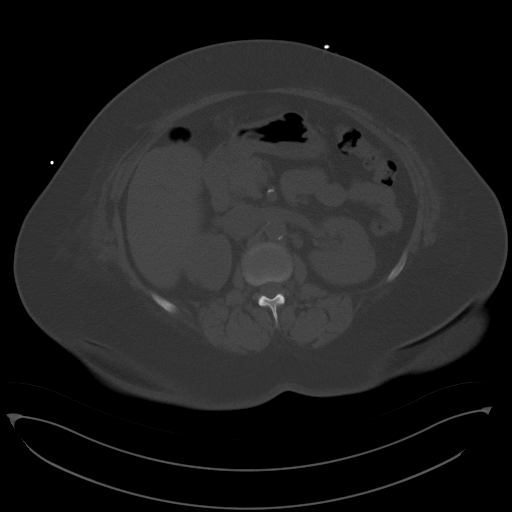
[im 70/96  soft-tissue]
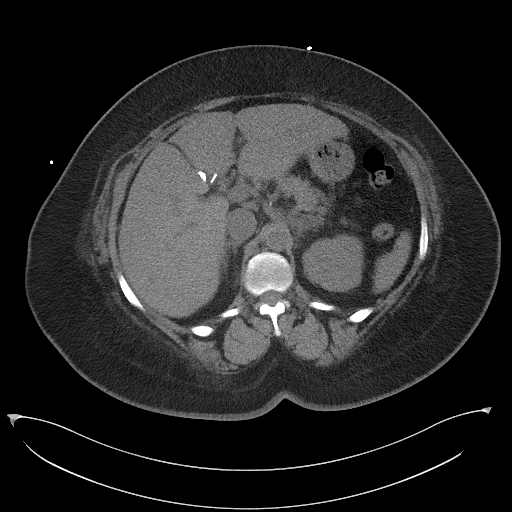
[im 77/96  soft-tissue]
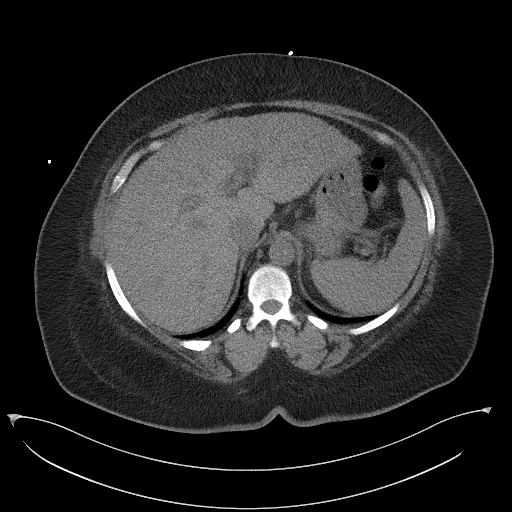
[im 83/96  soft-tissue]
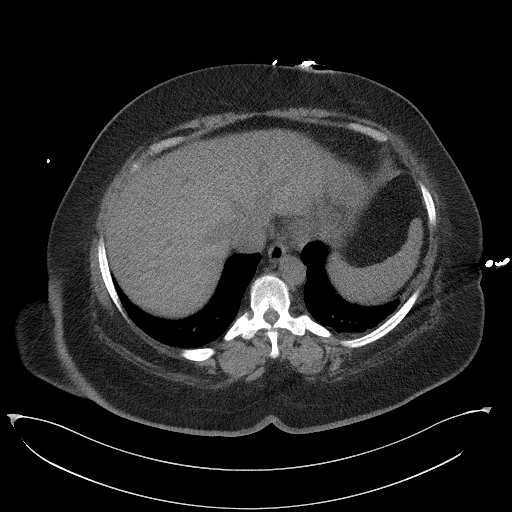
[im 89/96  soft-tissue]
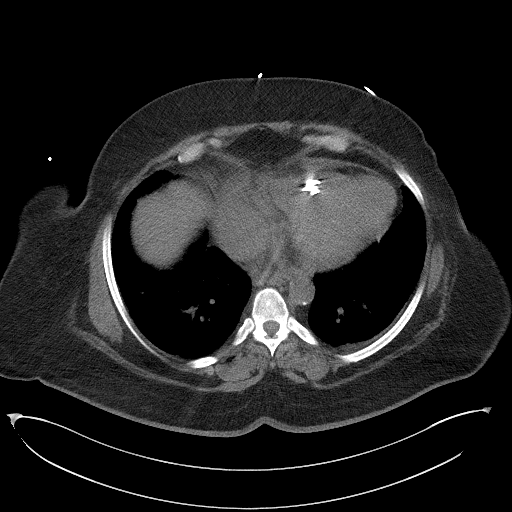

[Series 6: a/p w/o cor · coronal · non-contrast · 0.98mm/px · 3 of 189 slices shown]
[im 63/189  soft-tissue]
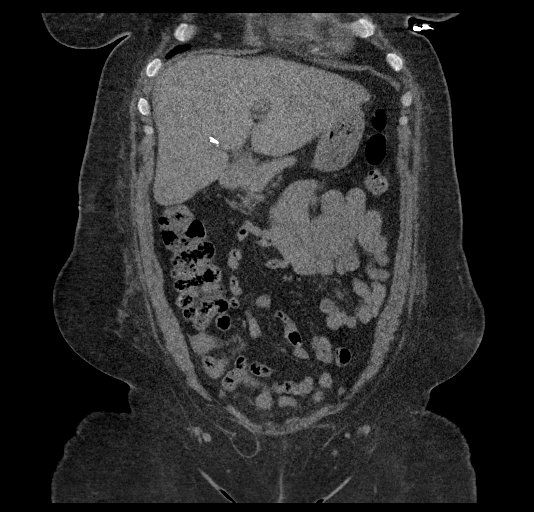
[im 84/189  soft-tissue]
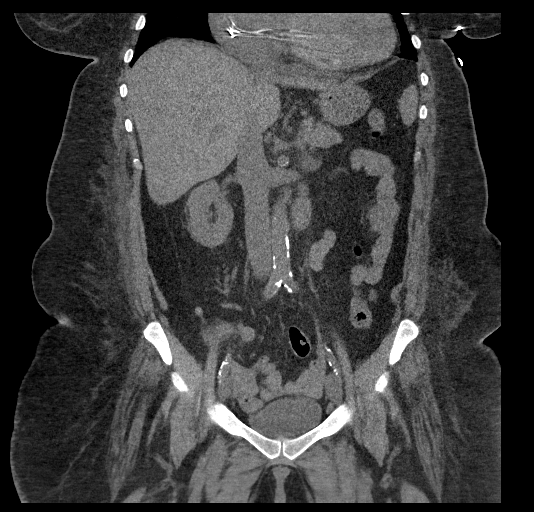
[im 105/189  soft-tissue]
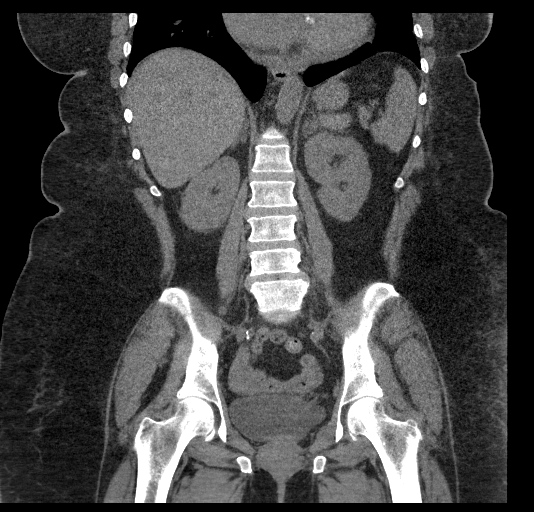

[16 of 46 positions shown; findings below may reference images not displayed]

FINDINGS: Lower chest: There is mosaic attenuation in the lung bases likely
reflecting air trapping. The heart is enlarged. Cardiac device leads
are partially imaged. There is trace pericardial fluid.

Hepatobiliary: The liver is unremarkable, within the confines of
noncontrast technique. The gallbladder is surgically absent. There
is no intra or extrahepatic biliary ductal dilatation.

Pancreas: Unremarkable.

Spleen: Unremarkable.

Adrenals/Urinary Tract: There is an approximately 1.6 cm left
adrenal nodule measuring 1.6 Hounsfield units consistent with a
benign adenoma. The right adrenal is unremarkable.

The kidneys are unremarkable. There are no focal lesions, within the
confines of noncontrast technique. There are no stones. There is no
hydronephrosis or hydroureter. The bladder is unremarkable.

Stomach/Bowel: The stomach is unremarkable. There is no evidence of
bowel obstruction. There is no abnormal bowel wall thickening or
inflammatory change. There is no definite blood in the bowel lumen,
though this is suboptimally evaluated given single-phase noncontrast
technique.

Vascular/Lymphatic: There is scattered calcified atherosclerotic
plaque in the nonaneurysmal abdominal aorta. There is no abdominal
or pelvic lymphadenopathy.

Reproductive: The uterus is surgically absent. There is no adnexal
mass.

Other: There is small volume simple fluid in the pelvis,
nonspecific. There is no free intraperitoneal air. There is no
evidence of retroperitoneal hematoma. There is a small fat
containing umbilical hernia, not significantly changed since 4824.
There is a small fat containing right inguinal hernia.

Musculoskeletal: There is grade 1 anterolisthesis of L4 on L5 and L5
on S1. There are bilateral pars defects at L5-S1, unchanged. There
is no acute osseous abnormality or aggressive osseous lesion.
IMPRESSION: 1. No definite evidence of GI bleed, suboptimally evaluated given
single-phase noncontrast technique.
2. No evidence of retroperitoneal hematoma.
3. Small volume simple fluid in the pelvis, nonspecific.
4. Benign left adrenal adenoma.
5. Cardiomegaly with trace pericardial effusion.
6. Mosaic attenuation in the lung bases likely reflecting air
trapping.

Aortic Atherosclerosis (S3288-QR6.6).

## 2023-03-22 IMAGING — DX DG CHEST 1V PORT
1 series · 1 of 1 positions shown · non-contrast
Comparison: September 11, 2020.

CLINICAL DATA: Shortness of breath.

EXAM:
PORTABLE CHEST 1 VIEW

[chest]
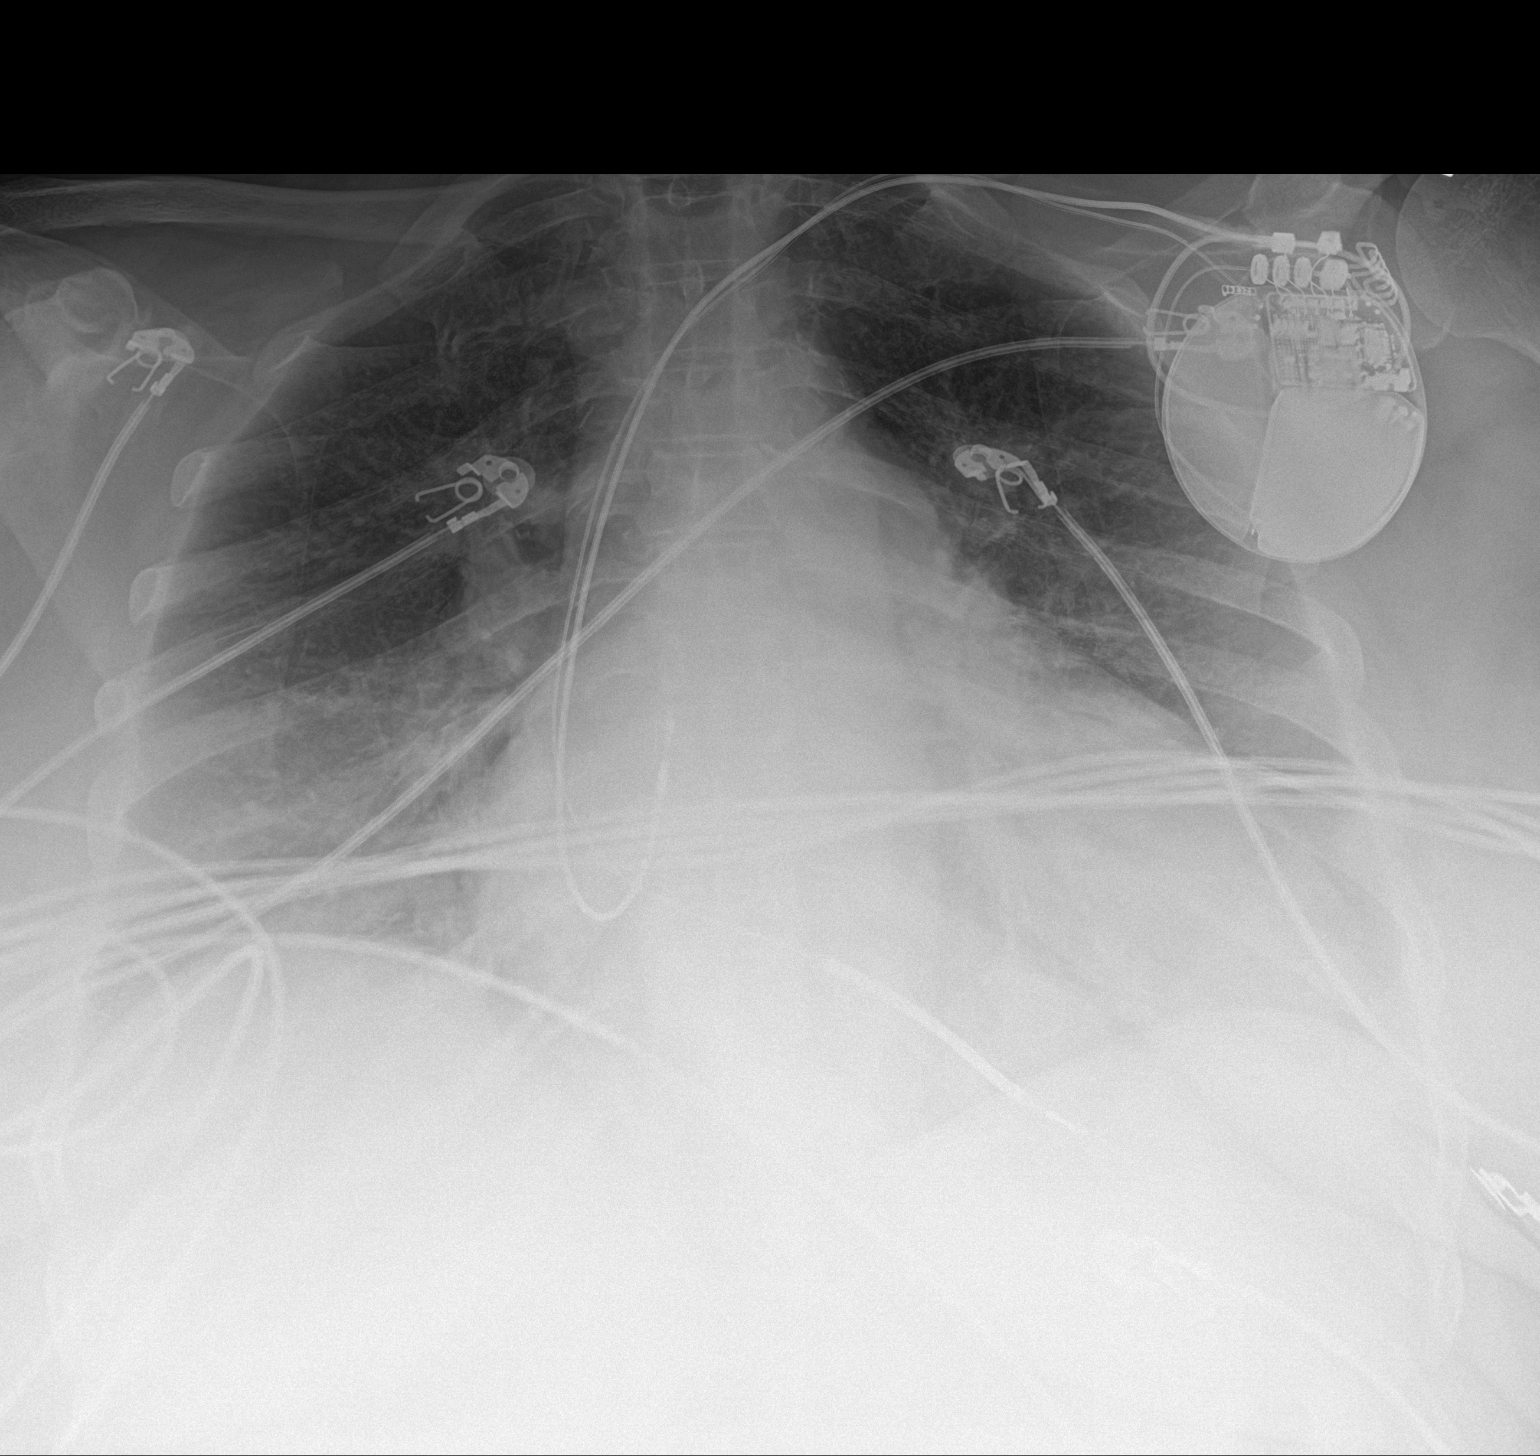

[1 of 1 positions shown; findings below may reference images not displayed]

FINDINGS: Stable cardiomegaly. Left-sided pacemaker is unchanged in position.
Lungs are clear. Bony thorax is unremarkable.
IMPRESSION: No active disease.

## 2023-03-25 ENCOUNTER — Encounter: Payer: Self-pay | Admitting: Nurse Practitioner

## 2023-03-25 DIAGNOSIS — Z23 Encounter for immunization: Secondary | ICD-10-CM | POA: Insufficient documentation

## 2023-03-25 NOTE — Assessment & Plan Note (Signed)
Continue current medications and f/u with Cardiology

## 2023-03-25 NOTE — Assessment & Plan Note (Signed)
 Blood pressure is well controlled, continue current medications.  Continue f/u with Cardiology.

## 2023-03-25 NOTE — Assessment & Plan Note (Signed)
Continue statin.  Tolerating well.

## 2023-03-25 NOTE — Assessment & Plan Note (Signed)
 Prevnar 20 given in office.

## 2023-03-25 NOTE — Assessment & Plan Note (Signed)
 She is encouraged to strive for BMI less than 30 to decrease cardiac risk. Advised to aim for at least 150 minutes of exercise per week.

## 2023-03-25 NOTE — Assessment & Plan Note (Signed)
HgbA1c is normal, will recheck today.

## 2023-03-25 NOTE — Assessment & Plan Note (Signed)
Cholesterol levels are stable.  Continue statin, tolerating well.

## 2023-03-25 NOTE — Assessment & Plan Note (Signed)
Stable, continue f/u with Cardiology

## 2023-03-28 ENCOUNTER — Ambulatory Visit (INDEPENDENT_AMBULATORY_CARE_PROVIDER_SITE_OTHER): Payer: 59

## 2023-03-28 DIAGNOSIS — I421 Obstructive hypertrophic cardiomyopathy: Secondary | ICD-10-CM

## 2023-04-01 LAB — CUP PACEART REMOTE DEVICE CHECK
Battery Remaining Longevity: 126 mo
Battery Remaining Percentage: 100 %
Brady Statistic RA Percent Paced: 8 %
Brady Statistic RV Percent Paced: 1 %
Date Time Interrogation Session: 20250320005100
HighPow Impedance: 58 Ohm
Implantable Lead Connection Status: 753985
Implantable Lead Connection Status: 753985
Implantable Lead Implant Date: 20220318
Implantable Lead Implant Date: 20220318
Implantable Lead Location: 753859
Implantable Lead Location: 753860
Implantable Lead Model: 673
Implantable Lead Model: 7841
Implantable Lead Serial Number: 1122351
Implantable Lead Serial Number: 158410
Implantable Pulse Generator Implant Date: 20220318
Lead Channel Impedance Value: 376 Ohm
Lead Channel Impedance Value: 731 Ohm
Lead Channel Setting Pacing Amplitude: 2 V
Lead Channel Setting Pacing Amplitude: 2.5 V
Lead Channel Setting Pacing Pulse Width: 0.4 ms
Lead Channel Setting Sensing Sensitivity: 0.5 mV
Pulse Gen Serial Number: 593724
Zone Setting Status: 755011

## 2023-04-06 ENCOUNTER — Encounter: Payer: Self-pay | Admitting: Cardiology

## 2023-04-16 ENCOUNTER — Other Ambulatory Visit: Payer: Self-pay | Admitting: Nurse Practitioner

## 2023-04-16 DIAGNOSIS — Z8709 Personal history of other diseases of the respiratory system: Secondary | ICD-10-CM

## 2023-04-16 MED ORDER — ALBUTEROL SULFATE HFA 108 (90 BASE) MCG/ACT IN AERS: 2.0000 | INHALATION_SPRAY | Freq: Four times a day (QID) | RESPIRATORY_TRACT | 3 refills | Status: AC | PRN

## 2023-04-16 MED ORDER — DULERA 100-5 MCG/ACT IN AERO: 2.0000 | INHALATION_SPRAY | Freq: Two times a day (BID) | RESPIRATORY_TRACT | 5 refills | Status: DC

## 2023-04-16 NOTE — Telephone Encounter (Signed)
 Copied from CRM [id number]. Topic: Clinical - Medication Refill >> Apr 16, 2023 11:42 AM Florestine Avers wrote: Most Recent Primary Care Visit:  Provider: Arnette Felts  Department: [facility]  Visit Type: OFFICE VISIT  Date: 03/14/2023  Medication: mometasone-formoterol (DULERA) 100-5 MCG/ACT AERO  Has the patient contacted their pharmacy? Yes (Agent: If no, request that the patient contact the pharmacy for the refill. If patient does not wish to contact the pharmacy document the reason why and proceed with request.) (Agent: If yes, when and what did the pharmacy advise?)  Is this the correct pharmacy for this prescription? Yes If no, delete pharmacy and type the correct one.  This is the patient's preferred pharmacy:  [id number] - [place] ([place]), [place] - [address] [place] [address] Phone: [phone] Fax: [phone]   Has the prescription been filled recently? Yes  Is the patient out of the medication? Yes  Has the patient been seen for an appointment in the last year OR does the patient have an upcoming appointment? Yes  Can we respond through MyChart? Yes  Agent: Please be advised that Rx refills may take up to 3 business days. We ask that you follow-up with your pharmacy.

## 2023-04-18 ENCOUNTER — Other Ambulatory Visit: Payer: Self-pay

## 2023-04-18 DIAGNOSIS — Z8709 Personal history of other diseases of the respiratory system: Secondary | ICD-10-CM

## 2023-04-18 MED ORDER — DULERA 100-5 MCG/ACT IN AERO: 2.0000 | INHALATION_SPRAY | Freq: Two times a day (BID) | RESPIRATORY_TRACT | 5 refills | Status: DC

## 2023-04-22 ENCOUNTER — Other Ambulatory Visit: Payer: Self-pay | Admitting: Nurse Practitioner

## 2023-04-22 DIAGNOSIS — I700 Atherosclerosis of aorta: Secondary | ICD-10-CM

## 2023-04-24 NOTE — Progress Notes (Signed)
 Cardiology Office Note:  .   Date:  04/25/2023  ID:  Alexandra Bell, DOB [birth year], MRN [id number] PCP: Susanna Epley, FNP   [facility] Providers Cardiologist:  Maudine Sos, MD Electrophysiologist:  Boyce Byes, MD    Patient Profile: .      PMH HOCM S/p ICD implant 03/2020 Obesity Hypertension PAF on chronic anticoagulation Tobacco abuse Hyperlipidemia Asthma  She reported a history of heart murmur for many years.  Echo 11/2019 revealed LVEF 65 to 70%, severe LVH, grade 2 diastolic dysfunction.  She was referred for cardiac MRI which revealed LVEF 56% with extensive mid wall late gadolinium enhancement in the basal to mid anterior septum, mid to apical anterior, and mid to apical inferolateral myocardium.  She wore an ambulatory monitor that revealed NSVT and PAF.  She was referred to EP for consideration of ICD which was implanted 03/2020.  When she saw PCP January 2024, BNP was elevated at 893 and she was started on Farxiga.  Echo 09/2021 revealed LVEF 70 to 75%, G1 DD, mid cavitary gradient was 4 m/s.  She was seen by Dr. Theodis Fiscal 03/29/2018 for at which time she reported she had been doing well.  She was getting some exercise walking around the house and yard but felt limited by arthritis in her knees.  She had been unable to reduce smoking to 4 cigarettes a day.  Home BP 130-140 which mirrors clinic BP of 138/68 and 138/64.  She usually cooks at home and is trying to cut down on salt intake.  Notably she was out of Entresto for a week.  Medical therapy for HCM at that time was amlodipine, carvedilol, and Entresto.  Seen by EP PA on 06/21/2022 at which time she had normal ICD function. No changes in medical regimen were implemented and 12 mo follow-up was recommended.   Echo 09/27/2022 revealed mid cavitary severe LVH consistent with HOCM.  No significant change from prior.       History of Present Illness: .    History of Present Illness Alexandra Bell is a  very pleasant 58 y.o. female who is here today for follow-up of HOCM. She reports she is feeling great with the exception of right knee pain which limits her activity. Is seeing ortho soon. She reports no problems or side effects with her cardiac medications. She denies chest pain, palpitations, shortness of breath, orthopnea, PND, presyncope, syncope. Has swelling of RLE that has been present since her knee started to hurt. She does not monitor her blood pressure at home. Admits that she does not exercise regularly but is able to perform her daily activities. Occasional wheezing secondary to asthma for which she uses her inhaler. Multiple bouts of bronchitis for about two to three years and was hospitalized for pneumonia over a year ago. She continues to smoke unfortunately. Is having discomfort in her right shoulder, especially when sleeping. She has an adjustable bed which helps with comfort. Was started on metformin for prediabetes.  Discussed the use of AI scribe software for clinical note transcription with the patient, who gave verbal consent to proceed.   ROS: See HPI       Studies Reviewed: Aaron Aas       No results found for: "LIPOA"   Risk Assessment/Calculations:    CHA2DS2-VASc Score = 4   This indicates a 4.8% annual risk of stroke. The patient's score is based upon: CHF History: 1 HTN History: 1 Diabetes History: 1 Stroke History: 0 Vascular Disease  History: 0 Age Score: 0 Gender Score: 1            Physical Exam:   VS:  BP (!) 122/50 (BP Location: Left Arm, Patient Position: Sitting, Cuff Size: Large)   Pulse 78   Ht 5\' 6"  (1.676 m)   BMI 41.71 kg/m    Wt Readings from Last 3 Encounters:  03/14/23 258 lb 6.4 oz (117.2 kg)  08/23/22 248 lb 12.8 oz (112.9 kg)  06/21/22 255 lb (115.7 kg)    GEN: Obese well developed in no acute distress NECK: No JVD; No carotid bruits CARDIAC: RRR, no murmurs, rubs, gallops RESPIRATORY:  Clear to auscultation without rales, wheezing or  rhonchi  ABDOMEN: Soft, non-tender, non-distended EXTREMITIES:  No edema; No deformity     ASSESSMENT AND PLAN: .    Assessment & Plan HOCM s/p ICD implant Stable intracavitary gradient on echo 09/27/2022.  She is asymptomatic.   No evidence of volume overload on exam.  BP is well-controlled. No concerning side effects with medications. We will continue Entresto, carvedilol, and amlodipine.  Remote device check 03/28/2023 stable.  Continue regular EP follow-up.   Hypertension BP is well controlled.  Stable renal function on labs completed 03/14/2023.  No change in antihypertensive therapy today.  Knee Pain   Chronic pain with swelling affects her activities. Orthopedic consultation is pending. Encouraged low-impact exercises such as stationary biking or swimming.  PAF on chronic anticoagulation No tachypalpitations.  HR is well-controlled.  No bleeding concerns.  Continue Eliquis 5 mg twice daily which is appropriate dose for stroke prevention for CHA2DS2-VASc score of 4.  Continue carvedilol for rate control.  Prediabetes /Obesity Her A1c has improved on metformin. Emphasized lifestyle modification to incorporate healthy diet and regular exercise.  Management per PCP.  Hyperlipidemia  Lipid panel 08/23/2022 with total cholesterol 146, triglycerides 138, HDL 70, and LDL-C 53.  Cholesterol is well-controlled on atorvastatin.   Smoking Cessation   She is a long-term smoker. Encouraged her to discuss lung cancer screening with PCP. Complete cessation advised.         Disposition:6 months with Dr. Theodis Fiscal or APP  Signed, Slater Duncan, NP-C

## 2023-04-25 ENCOUNTER — Encounter (HOSPITAL_BASED_OUTPATIENT_CLINIC_OR_DEPARTMENT_OTHER): Payer: Self-pay | Admitting: Nurse Practitioner

## 2023-04-25 ENCOUNTER — Ambulatory Visit (INDEPENDENT_AMBULATORY_CARE_PROVIDER_SITE_OTHER): Payer: Medicare HMO | Admitting: Nurse Practitioner

## 2023-04-25 VITALS — BP 122/50 | HR 78 | Ht 66.0 in

## 2023-04-25 DIAGNOSIS — I421 Obstructive hypertrophic cardiomyopathy: Secondary | ICD-10-CM | POA: Diagnosis not present

## 2023-04-25 DIAGNOSIS — Z95810 Presence of automatic (implantable) cardiac defibrillator: Secondary | ICD-10-CM | POA: Diagnosis not present

## 2023-04-25 DIAGNOSIS — I10 Essential (primary) hypertension: Secondary | ICD-10-CM | POA: Diagnosis not present

## 2023-04-25 DIAGNOSIS — F17200 Nicotine dependence, unspecified, uncomplicated: Secondary | ICD-10-CM

## 2023-04-25 DIAGNOSIS — M25561 Pain in right knee: Secondary | ICD-10-CM | POA: Diagnosis not present

## 2023-04-25 DIAGNOSIS — I480 Paroxysmal atrial fibrillation: Secondary | ICD-10-CM

## 2023-04-25 DIAGNOSIS — Z7901 Long term (current) use of anticoagulants: Secondary | ICD-10-CM

## 2023-04-25 DIAGNOSIS — R7303 Prediabetes: Secondary | ICD-10-CM

## 2023-04-25 DIAGNOSIS — G8929 Other chronic pain: Secondary | ICD-10-CM

## 2023-04-25 NOTE — Patient Instructions (Signed)
 Medication Instructions:  Your physician recommends that you continue on your current medications as directed. Please refer to the Current Medication list given to you today.  *If you need a refill on your cardiac medications before your next appointment, please call your pharmacy*  Lab Work: None Ordered If you have labs (blood work) drawn today and your tests are completely normal, you will receive your results only by: MyChart Message (if you have MyChart) OR A paper copy in the mail If you have any lab test that is abnormal or we need to change your treatment, we will call you to review the results.  Testing/Procedures: None Ordered  Follow-Up: At [facility], you and your health needs are our priority.  As part of our continuing mission to provide you with exceptional heart care, our providers are all part of one team.  This team includes your primary Cardiologist (physician) and Advanced Practice Providers or APPs (Physician Assistants and Nurse Practitioners) who all work together to provide you with the care you need, when you need it.  Your next appointment:   6 month(s)  Provider:   Chilton Si, MD   Gillian Shields, NP or Eligha Bridegroom, NP  We recommend signing up for the patient portal called "MyChart".  Sign up information is provided on this After Visit Summary.  MyChart is used to connect with patients for Virtual Visits (Telemedicine).  Patients are able to view lab/test results, encounter notes, upcoming appointments, etc.  Non-urgent messages can be sent to your provider as well.   To learn more about what you can do with MyChart, go to [email or web address].   Other Instructions Tackling Obesity with Lifestyle Changes  Obesity- What is it? And What can we do about it?  Obesity is a chronic complex disease defined as excessive fat deposits that can have a negative effect on our health. It can lead to many other diseases including type 2  diabetes.  Weight gain occurs when the amount of energy (calories) we consume is greater than the amount we use.  When our energy output is greater than our energy input we lose weight. The basic concept is simple, but in reality, it's much more complicated.  Unfortunately, in some people, our bodies have many ways it can compensate when we try to eat less and move more which can prevent [place] from changing our weight. This can lead to some people having a much more difficult time losing weight even when they put healthy habits into practice. This can be frustrating. We want to focus on healthy habits, physical activity and how we feel, and less the number on the scale.  Food As Energy  Calories  Calories is just a unit of measurement for energy.  Counting calories is not required to lose weight but counting for a short period of time can:   help you learn good portion sizes   Learn what your true energy needs are.   Help you be more aware of your snacking or grazing habits  To help calculate how many calories you should be eating, the NIH has a great body weight planner calculator at BeverageBuggy.si  Types of Energy Expenditure  Basal Metabolic Rate (BMR) Energy that our bodies use to preform everyday tasks. More muscle mass through resistance training can increase this a small amount  Thermic Effect of Food The amount of energy that it takes to breakdown the food we eat. This will be highest when we eat protein and fiber rich  foods  Exercise Energy Expenditure The amount of energy used during formal exercise (walking, biking, weightlifting)  Non-exercise activity thermogenesis (NEAT) The amount of energy spent on activities that are not formal exercise (standing, fidgeting). Therefore, it is not only important to do formal exercise but also move around throughout the day.  Managing The Meal  Macro nutrients (carbohydrates, fats and protein, fiber, water)  Micronutrients  (vitamins, minerals)  Dietary Fiber  Benefits Examples Cautions  Soluble fiber  Decreases cholesterol  improve blood sugar control,  Feeds our gut bacteria  Allows [place] to feel fuller for longer so we eat less  fruits  oats  barley  legumes  peas  Beans  vegetables (broccoli) and root vegetables (carrots) Add fiber into your diet slowly and be sure to drink at least 8 cups of water a day. This will help limit gas, bloating, diarrhea, or constipation.  Insoluble Fiber  Improves digestive health by making stool easier to pass  Allows [place] to feel fuller for longer so we eat less  whole grains  nuts  seeds  skin of fruit  vegetables (green beans, zucchini, cauliflower)  Tricks to add more fiber to your diet   Add beans (pinto, kidney, lima, navy and garbanzo) to salads, ground meat or brown rice   Add nuts or seeds and or fresh/frozen fruit to yogurt, cottage cheese, salads or steel cut oats   Cut up vegetables and eat with hummus   Look for unsweetened whole grain cereals with at least 5g of fiber per serving   Switch to whole grain bread. Look for bread that has whole grain flour as the first ingredient and has more fiber than carbs if you were to multiple the fiber x 10.   Try bulgar, barely, quinoa, buckwheat, brown rice wild rice instead of white rice   Keep frozen vegetables on hand to add to dishes or soups  Meal Planning:  Meal planning is the key to setting you up for success. Here are some examples of healthy meal options.  Breakfast  Option 1: Omelette with vegetables (1 egg, spinach, mushrooms, or other vegetable of your choice), 2 slices whole-grain toast, tip of thumb size butter or soft margarine,  cup low-fat milk or yogurt  Option 2: steel-cut rolled oats (? cup dry), 1 tbsp peanut butter added to cooked oats,  cup low-fat milk.  Option 3: 2 slices whole-grain or rye toast with avocado spread ( small avocado mased with herbs and pepper to  taste), 1 poached egg or [place] up (cooked to your liking)  Option 4:  cup plain 0% [place] yogurt topped with  cup berries and  cup walnuts or almonds, 2 slices whole-grain or rye toast, tip of thumb size soft margarine/butter  Lunch:  Option 1: 2 cups red lentil soup, green salad with 1 tbsp homemade vinaigrette (extra virgin olive oil and vinegar of choice plus spices)  Option 2: 3 oz. roasted chicken, 2 slices whole-grain bread, 2 tsp mayonnaise, mustard, lettuce, tomato if desired, 1 fruit (example: medium-sized apple or small pear)  Option 3: 3 oz. tuna packed in water, 1 whole-wheat pita (6 inch), 2 tsp mayonnaise, lettuce, tomato, or other non-starchy vegetable of your choice, 1 fruit (example: medium-sized apple or small pear)  Option 4: 1 serving of garden veggie buddha bowl with lentils and tahini sauce and 1 cup berries topped with  cup plain 0% Greek yogurt  Dinner:  Option 1: 1 serving roasted cauliflower salad, 3-4 oz. grilled or  baked pork loin chop, 1/2 cup mashed potato, or brown rice or quinoa  Option 2: 1 serving fish (baked, grilled or air fried), green salad, 1 tbsp homemade vinaigrette,  cup cooked couscous  Option 3: 1 cup cooked whole grained pasta (example: spaghetti, spirals, macaroni),  cup favorite pasta sauce (preferably homemade), 3-4 oz. grilled or baked chicken, green salad, 1 tbsp homemade vinaigrette  Option 4: 1 serving oven roasted salmon,  cup mashed sweet potato or couscous or brown rice or quinoa, broccoli (steamed or roasted)  Healthy snacks:   Carrots or celery with 1 tbsp of hummus   1 medium-sized fruit (apple or orange)   1 cup plain 0% [place] yogurt with  cup berries   Half apple, sliced, with 1 tbsp (15 mL) peanut or almond butter  Dining out:  Eating away from home has become a part of many people's lifestyle. Making healthy choices when you are eating out is important too. Portion size is an important part of healthy  choices. Most branded fast-food places provide calories, sodium, and fat content for their menu items. www.calorieking.com would be great resource to find nutrition facts for your favorite brands and fast-food restaurants. Company specific website can be Chief Technology Officer for nutrition information for their items. (e.g. www.mcdonalds.com or www.nutritionix.com/[place]/menu/premium)  Here are some tips to help you make wise food choices when you are dining out.  Chose more often Avoid  Beverages   Choose more often: Water, low fat milk  Sugar-free/diet drinks  Unsweet tea or coffee    Avoid: Milkshakes, fruit drinks, regular pop  Alcohol, specialty drinks (e.g. iced cappuccino)  Fast food  Choose more often:  Garden salad  Mini subs, pita sandwiches ect with extra vegetables  plain burgers, grilled chicken  Vegetarian or cheese pizza with whole-grain crust    Avoid: Burgers/sandwiches with bacon, cheese, and high-fat sauces  [place] fries, fried chicken, fried fish, poutine, hash browns  Pizza with processed meats  Starters   Choose more often: Raw vegetables, salads (garden, spinach, fruit)  clear or vegetable soups  Seafood cocktail  Whole-grain breads and rolls    Avoid: Salads with high-fat dressings or toppings  Creamy soups  Wings, egg rolls  onion rings, nachos  White or garlic bread  Main courses Grains & Starches (amount equal to  of your plate)  Choose more often:  Oatmeal, high-fiber/lower-sugar cereals  Whole-grain breads, rice, pasta, barley, couscous  Sweet potatoes    Avoid: Sugary, low-fiber cereals  Large bagels, muffins, croissants, white bread  [place] fries, hash browns, fried rice   Meat and alternative (amount equal to  of your plate)  Choose more often:  Lean meats, poultry, fish, eggs, low-fat cheese  Tofu, vegetable protein Legumes (e.g. lentils, chickpeas, beans)    Avoid: High-salt and/or high-fat meats (e.g. ribs, wings,  sausages, wieners, processed lunch meats, imposter meats)   Vegetables (amount equal to  of your plate)  Choose more often:  Salads ([place], garden, spinach), plain vegetables   Avoid:  Salads with creamy, high-fat dressings and   Vegetables on sandwiches ect toppings like bacon bits, croutons, cheese  Desserts  Choose more often:  Fresh fruit, frozen yogourt, skim milk latte    Avoid: Cakes, pies, pastries, ice cream, cheesecake   Mediterranean Diet A Mediterranean diet is based on the traditions of countries on the [employer]. It focuses on eating more: Fruits and vegetables. Whole grains, beans, nuts, and seeds. Heart-healthy fats. These are fats that are good for  your heart. It involves eating less: Dairy. Meat and eggs. Processed foods with added sugar, salt, and fat. This type of diet can help prevent certain conditions. It can also improve outcomes if you have a long-term (chronic) disease, such as kidney or heart disease. What are tips for following this plan? Reading food labels Check packaged foods for: The serving size. For foods such as rice and pasta, the serving size is the amount of cooked product, not dry. The total fat. Avoid foods with saturated fat or trans fat. Added sugars, such as corn syrup. Shopping  Try to have a balanced diet. Buy a variety of foods, such as: Fresh fruits and vegetables. You may be able to get these from local farmers markets. You can also buy them frozen. Grains, beans, nuts, and seeds. Some of these can be bought in bulk. Fresh seafood. Poultry and eggs. Low-fat dairy products. Buy whole ingredients instead of foods that have already been packaged. If you can't get fresh seafood, buy precooked frozen shrimp or canned fish, such as tuna, salmon, or sardines. Stock your pantry so you always have certain foods on hand, such as olive oil, canned tuna, canned tomatoes, rice, pasta, and beans. Cooking Cook foods with  extra-virgin olive oil instead of using butter or other vegetable oils. Have meat as a side dish. Have vegetables or grains as your main dish. This means having meat in small portions or adding small amounts of meat to foods like pasta or stew. Use beans or vegetables instead of meat in common dishes like chili or lasagna. Try out different cooking methods. Try roasting, broiling, steaming, and sauting vegetables. Add frozen vegetables to soups, stews, pasta, or rice. Add nuts or seeds for added healthy fats and plant protein at each meal. You can add these to yogurt, salads, or vegetable dishes. Marinate fish or vegetables using olive oil, lemon juice, garlic, and fresh herbs. Meal planning Plan to eat a vegetarian meal one day each week. Try to work up to two vegetarian meals, if possible. Eat seafood two or more times a week. Have healthy snacks on hand. These may include: Vegetable sticks with hummus. Greek yogurt. Fruit and nut trail mix. Eat balanced meals. These should include: Fruit: 2-3 servings a day. Vegetables: 4-5 servings a day. Low-fat dairy: 2 servings a day. Fish, poultry, or lean meat: 1 serving a day. Beans and legumes: 2 or more servings a week. Nuts and seeds: 1-2 servings a day. Whole grains: 6-8 servings a day. Extra-virgin olive oil: 3-4 servings a day. Limit red meat and sweets to just a few servings a month. Lifestyle  Try to cook and eat meals with your family. Drink enough fluid to keep your pee (urine) pale yellow. Be active every day. This includes: Aerobic exercise, which is exercise that causes your heart to beat faster. Examples include running and swimming. Leisure activities like gardening, walking, or housework. Get 7-8 hours of sleep each night. Drink red wine if your provider says you can. A glass of wine is 5 oz (150 mL). You may be allowed to have: Up to 1 glass a day if you're female and not pregnant. Up to 2 glasses a day if you're  female. What foods should I eat? Fruits Apples. Apricots. Avocado. Berries. Bananas. Cherries. Dates. Figs. Grapes. Lemons. Melon. Oranges. Peaches. Plums. Pomegranate. Vegetables Artichokes. Beets. Broccoli. Cabbage. Carrots. Eggplant. Green beans. Chard. Kale. Spinach. Onions. Leeks. Peas. Squash. Tomatoes. Peppers. Radishes. Grains Whole-grain pasta. Brown rice. Bulgur wheat.  Polenta. Couscous. Whole-wheat bread. Dwyane Glad. Meats and other proteins Beans. Almonds. Sunflower seeds. Pine nuts. Peanuts. Cod. Salmon. Scallops. Shrimp. Tuna. Tilapia. Clams. Oysters. Eggs. Chicken or [place] without skin. Dairy Low-fat milk. Cheese. Greek yogurt. Fats and oils Extra-virgin olive oil. Avocado oil. Grapeseed oil. Beverages Water. Red wine. Herbal tea. Sweets and desserts Greek yogurt with honey. Baked apples. Poached pears. Trail mix. Seasonings and condiments Basil. Cilantro. Coriander. Cumin. Mint. Parsley. Sage. Rosemary. Tarragon. Garlic. Oregano. Thyme. Pepper. Balsamic vinegar. Tahini. Hummus. Tomato sauce. Olives. Mushrooms. The items listed above may not be all the foods and drinks you can have. Talk to a dietitian to learn more. What foods should I limit? This is a list of foods that should be eaten rarely. Fruits Fruit canned in syrup. Vegetables Deep-fried potatoes, like [place] fries. Grains Packaged pasta or rice dishes. Cereal with added sugar. Snacks with added sugar. Meats and other proteins Beef. Pork. Lamb. Chicken or [place] with skin. Hot dogs. Helene Loader. Dairy Ice cream. Sour cream. Whole milk. Fats and oils Butter. Canola oil. Vegetable oil. Beef fat (tallow). Lard. Beverages Juice. Sugar-sweetened soft drinks. Beer. Liquor and spirits. Sweets and desserts Cookies. Cakes. Pies. Candy. Seasonings and condiments Mayonnaise. Pre-made sauces and marinades. The items listed above may not be all the foods and drinks you should limit. Talk to a dietitian to learn  more. Where to find more information American Heart Association (AHA): heart.org This information is not intended to replace advice given to you by your health care provider. Make sure you discuss any questions you have with your health care provider. Document Revised: 04/08/2022 Document Reviewed: 04/08/2022 Elsevier Patient Education  2024 [employer].

## 2023-05-01 ENCOUNTER — Other Ambulatory Visit: Payer: Self-pay

## 2023-05-01 DIAGNOSIS — Z8709 Personal history of other diseases of the respiratory system: Secondary | ICD-10-CM

## 2023-05-01 MED ORDER — BUDESONIDE-FORMOTEROL FUMARATE 80-4.5 MCG/ACT IN AERO: 2.0000 | INHALATION_SPRAY | Freq: Two times a day (BID) | RESPIRATORY_TRACT | 12 refills | Status: DC | PRN

## 2023-05-02 DIAGNOSIS — M25561 Pain in right knee: Secondary | ICD-10-CM | POA: Diagnosis not present

## 2023-05-02 DIAGNOSIS — M67911 Unspecified disorder of synovium and tendon, right shoulder: Secondary | ICD-10-CM | POA: Diagnosis not present

## 2023-05-09 NOTE — Addendum Note (Signed)
 Addended by: Lott Rouleau A on: 05/09/2023 10:36 AM   Modules accepted: Orders

## 2023-05-09 NOTE — Progress Notes (Signed)
 Remote ICD transmission.

## 2023-05-17 ENCOUNTER — Other Ambulatory Visit: Payer: Self-pay | Admitting: Nurse Practitioner

## 2023-05-20 ENCOUNTER — Other Ambulatory Visit: Payer: Self-pay | Admitting: Nurse Practitioner

## 2023-05-20 DIAGNOSIS — Z8709 Personal history of other diseases of the respiratory system: Secondary | ICD-10-CM

## 2023-05-20 MED ORDER — BUDESONIDE-FORMOTEROL FUMARATE 80-4.5 MCG/ACT IN AERO: 2.0000 | INHALATION_SPRAY | Freq: Two times a day (BID) | RESPIRATORY_TRACT | 12 refills | Status: DC | PRN

## 2023-05-23 ENCOUNTER — Telehealth: Payer: Self-pay

## 2023-05-23 NOTE — Telephone Encounter (Signed)
 Called patient to see how long she had medicare- if less than a year she needs Welcome to medicare with Provider- If over a year patient needs AWV ( CAN BE ON FRIDAY CMA [facility])

## 2023-05-30 DIAGNOSIS — M25561 Pain in right knee: Secondary | ICD-10-CM | POA: Diagnosis not present

## 2023-05-31 ENCOUNTER — Other Ambulatory Visit: Payer: Self-pay | Admitting: Nurse Practitioner

## 2023-05-31 DIAGNOSIS — R7309 Other abnormal glucose: Secondary | ICD-10-CM

## 2023-06-14 ENCOUNTER — Other Ambulatory Visit: Payer: Self-pay

## 2023-06-14 DIAGNOSIS — Z8709 Personal history of other diseases of the respiratory system: Secondary | ICD-10-CM

## 2023-06-14 MED ORDER — BUDESONIDE-FORMOTEROL FUMARATE 80-4.5 MCG/ACT IN AERO: 2.0000 | INHALATION_SPRAY | Freq: Two times a day (BID) | RESPIRATORY_TRACT | 12 refills | Status: AC | PRN

## 2023-06-14 NOTE — Telephone Encounter (Signed)
 Copied from CRM [id number]. Topic: General - Other >> Jun 13, 2023 11:18 AM Carlatta H wrote: Reason for CRM: Patient dropped of paperwork about 3 weeks about medication that was not covered by insurance//Please call back//  LVM FOR PT TO CALL BACK.

## 2023-06-27 ENCOUNTER — Ambulatory Visit (INDEPENDENT_AMBULATORY_CARE_PROVIDER_SITE_OTHER): Payer: 59

## 2023-06-27 DIAGNOSIS — I421 Obstructive hypertrophic cardiomyopathy: Secondary | ICD-10-CM | POA: Diagnosis not present

## 2023-06-27 LAB — CUP PACEART REMOTE DEVICE CHECK
Battery Remaining Longevity: 132 mo
Battery Remaining Percentage: 100 %
Brady Statistic RA Percent Paced: 6 %
Brady Statistic RV Percent Paced: 2 %
Date Time Interrogation Session: 20250619010400
HighPow Impedance: 71 Ohm
Implantable Lead Connection Status: 753985
Implantable Lead Connection Status: 753985
Implantable Lead Implant Date: 20220318
Implantable Lead Implant Date: 20220318
Implantable Lead Location: 753859
Implantable Lead Location: 753860
Implantable Lead Model: 673
Implantable Lead Model: 7841
Implantable Lead Serial Number: 1122351
Implantable Lead Serial Number: 158410
Implantable Pulse Generator Implant Date: 20220318
Lead Channel Impedance Value: 381 Ohm
Lead Channel Impedance Value: 657 Ohm
Lead Channel Setting Pacing Amplitude: 2 V
Lead Channel Setting Pacing Amplitude: 2.5 V
Lead Channel Setting Pacing Pulse Width: 0.4 ms
Lead Channel Setting Sensing Sensitivity: 0.5 mV
Pulse Gen Serial Number: 593724
Zone Setting Status: 755011

## 2023-06-28 ENCOUNTER — Ambulatory Visit: Payer: Self-pay | Admitting: Cardiology

## 2023-06-29 ENCOUNTER — Other Ambulatory Visit: Payer: Self-pay | Admitting: Cardiovascular Disease

## 2023-07-04 ENCOUNTER — Ambulatory Visit: Admitting: Nurse Practitioner

## 2023-07-04 ENCOUNTER — Encounter: Payer: Self-pay | Admitting: Nurse Practitioner

## 2023-07-04 VITALS — BP 140/82 | HR 83 | Temp 99.3°F | Ht 66.0 in

## 2023-07-04 DIAGNOSIS — R7303 Prediabetes: Secondary | ICD-10-CM | POA: Diagnosis not present

## 2023-07-04 DIAGNOSIS — I700 Atherosclerosis of aorta: Secondary | ICD-10-CM

## 2023-07-04 DIAGNOSIS — I10 Essential (primary) hypertension: Secondary | ICD-10-CM | POA: Diagnosis not present

## 2023-07-04 DIAGNOSIS — Z95811 Presence of heart assist device: Secondary | ICD-10-CM | POA: Diagnosis not present

## 2023-07-04 DIAGNOSIS — E782 Mixed hyperlipidemia: Secondary | ICD-10-CM

## 2023-07-04 NOTE — Progress Notes (Signed)
 I,Jameka J Llittleton, CMA,acting as a Neurosurgeon for [employer], FNP.,have documented all relevant documentation on the behalf of Gaines Ada, FNP,as directed by  Gaines Ada, FNP while in the presence of Gaines Ada, FNP.  Subjective:  Patient ID: Alexandra Bell , female    DOB: 18-Mar-1965 , 58 y.o.   MRN: [id number]  Chief Complaint  Patient presents with   Hypertension    Patient presents today for a bp and pre dm follow up, Patient reports compliance with medication. Patient denies any chest pain, SOB, or headaches. Patient has no concerns today.    Hyperlipidemia    HPI  She is here today with her sister Wyline who takes of her. She has been to Orthopedics without any changes.  Hypertension This is a chronic problem. The current episode started more than 1 year ago. The problem is uncontrolled. Pertinent negatives include no anxiety, chest pain, headaches or palpitations. Risk factors for coronary artery disease include sedentary lifestyle and obesity. Past treatments include ACE inhibitors and diuretics. There are no compliance problems.  There is no history of angina or kidney disease. There is no history of chronic renal disease.  Hyperlipidemia This is a chronic problem. The problem is controlled. She has no history of chronic renal disease. Pertinent negatives include no chest pain. Risk factors for coronary artery disease include obesity.     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   Heart attack Father    Diabetes Sister      Current Outpatient Medications:    albuterol  (PROVENTIL ) (2.5 MG/3ML) 0.083% nebulizer solution, Inhale 3 mL 3 times a day by nebulization route as needed for 30 days., Disp: 75 mL, Rfl: 3   albuterol  (VENTOLIN  HFA) 108 (90 Base) MCG/ACT inhaler, Inhale 2 puffs into the lungs every 6 (six) hours as needed. For asthma, Disp: 1 each, Rfl: 3   amLODipine  (NORVASC ) 10 MG tablet, Take 1 tablet by mouth once daily, Disp: 90 tablet, Rfl: 3   apixaban  (ELIQUIS ) 5 MG TABS tablet, Take 1 tablet by mouth twice daily, Disp: 180 tablet, Rfl: 1   ascorbic acid (VITAMIN C) 250 MG CHEW, Chew 500 mg by mouth daily., Disp: , Rfl:    atorvastatin  (LIPITOR) 20 MG tablet, Take 1 tablet by mouth once daily, Disp: 90 tablet, Rfl: 0   budesonide -formoterol  (SYMBICORT ) 80-4.5 MCG/ACT inhaler, Inhale 2 puffs into the lungs 2 (two) times daily as  needed., Disp: 1 each, Rfl: 12   carvedilol  (COREG ) 25 MG tablet, Take 1 tablet by mouth twice daily, Disp: 180 tablet, Rfl: 3   cyanocobalamin  (VITAMIN B12) 1000 MCG tablet, Take 1,000 mcg by mouth daily., Disp: , Rfl:    diclofenac  Sodium (VOLTAREN ) 1 % GEL, Apply 2 g topically daily as needed (For pain)., Disp: 50 g, Rfl: 1   empagliflozin  (JARDIANCE ) 10 MG TABS tablet, Take 1 tablet (10 mg total) by mouth daily., Disp: 90 tablet, Rfl: 1   ferrous sulfate  325 (65 FE) MG EC tablet, Take 1 tablet (325 mg total) by mouth 2 (two) times daily with a meal., Disp: 180 tablet, Rfl: 2   furosemide  (LASIX ) 80 MG tablet, Take 1 tablet by mouth  once daily, Disp: 90 tablet, Rfl: 3   gabapentin  (NEURONTIN ) 300 MG capsule, Take 1 capsule by mouth twice daily, Disp: 180 capsule, Rfl: 0   guaiFENesin -dextromethorphan (ROBITUSSIN DM) 100-10 MG/5ML syrup, Take 5 mLs by mouth every 4 (four) hours as needed for cough., Disp: 118 mL, Rfl: 0   KLOR-CON  M20 20 MEQ tablet, Take 1 tablet by mouth once daily, Disp: 90 tablet, Rfl: 0   Magnesium  250 MG TABS, Take 250 mg by mouth daily at 2 PM., Disp: , Rfl:    melatonin 5 MG TABS, Take 5 mg by mouth daily at 2 PM., Disp: , Rfl:    meloxicam  (MOBIC ) 15 MG tablet, Take 15 mg by mouth daily as needed., Disp: , Rfl:    metFORMIN  (GLUCOPHAGE ) 500 MG tablet, TAKE 1 TABLET BY MOUTH TWICE DAILY WITH A MEAL, Disp: 180 tablet, Rfl: 0   mometasone -formoterol  (DULERA ) 100-5 MCG/ACT AERO, Inhale 2 puffs into the lungs 2 (two) times daily., Disp: 13 g, Rfl: 5   Multiple Vitamin (MULTIVITAMIN WITH MINERALS) TABS tablet, Take 1 tablet by mouth daily., Disp: , Rfl:    pantoprazole  (PROTONIX ) 40 MG tablet, Take 1 tablet by mouth twice daily, Disp: 180 tablet, Rfl: 0   sacubitril -valsartan  (ENTRESTO ) 24-26 MG, Take 1 tablet by mouth twice daily, Disp: 180 tablet, Rfl: 3   No Known Allergies   Review of Systems  Cardiovascular:  Negative for chest pain and palpitations.  Neurological:  Negative for headaches.     Today's Vitals   07/04/23 1059  BP: (!) 140/82  Pulse: 83  Temp: 99.3 F (37.4 C)  TempSrc: Oral  Height: 5' 6 (1.676 m)  PainSc: 6   PainLoc: Knee   Body mass index is 41.71 kg/m.  Wt Readings from Last 3 Encounters:  03/14/23 258 lb 6.4 oz (117.2 kg)  08/23/22 248 lb 12.8 oz (112.9 kg)  06/21/22 255 lb (115.7 kg)      Objective:  Physical Exam Vitals and nursing note reviewed.  Constitutional:      General: She is not in acute distress.    Appearance: Normal appearance. She is well-developed. She is obese.  Cardiovascular:     Rate and Rhythm: Normal rate and regular rhythm.      Heart sounds: Normal heart sounds. No murmur heard. Pulmonary:     Effort: Pulmonary effort is normal. No respiratory distress.     Breath sounds: Normal breath sounds. No wheezing.  Musculoskeletal:        General: No tenderness. Normal range of motion.     Right hand: No tenderness.     Left hand: No tenderness.     Right lower leg: No edema.     Left lower leg: No edema.  Comments: Using cane for ambulation  Skin:    General: Skin is warm and dry.     Capillary Refill: Capillary refill takes less than 2 seconds.     Comments: .   Neurological:     General: No focal deficit present.     Mental Status: She is alert and oriented to person, place, and time.     Cranial Nerves: No cranial nerve deficit.     Motor: No weakness.  Psychiatric:        Mood and Affect: Mood normal.        Behavior: Behavior normal.        Thought Content: Thought content normal.        Judgment: Judgment normal.         Assessment And Plan:  Essential hypertension Assessment & Plan: Blood pressure is elevated today, she is encouraged to stay well hydrated, continue current medications. Continue f/u with Cardiology.    Mixed hyperlipidemia Assessment & Plan: Cholesterol levels are stable. Continue statin, tolerating well.    Prediabetes Assessment & Plan: HgbA1c is normal, will recheck today.    Atherosclerosis of aorta [facility]) Assessment & Plan: Continue statin. Tolerating well    Presence of heart assist device (HCC)    Return if symptoms worsen or fail to improve, for HM 4 months.  Patient was given opportunity to ask questions. Patient verbalized understanding of the plan and was able to repeat key elements of the plan. All questions were answered to their satisfaction.    LILLETTE Gaines Ada, FNP, have reviewed all documentation for this visit. The documentation on 07/04/23 for the exam, diagnosis, procedures, and orders are all accurate and complete.   IF YOU HAVE BEEN REFERRED  TO A SPECIALIST, IT MAY TAKE 1-2 WEEKS TO SCHEDULE/PROCESS THE REFERRAL. IF YOU HAVE NOT HEARD FROM US /SPECIALIST IN TWO WEEKS, PLEASE GIVE US  A CALL AT [phone] X [phone].

## 2023-07-04 NOTE — Patient Instructions (Signed)
 [facility] call for [facility] appt for [facility] Address: [address], [place], [place] [address] Phone: [phone]

## 2023-07-14 NOTE — Assessment & Plan Note (Signed)
Continue statin.  Tolerating well.

## 2023-07-14 NOTE — Assessment & Plan Note (Addendum)
 Blood pressure is elevated today, she is encouraged to stay well hydrated, continue current medications. Continue f/u with Cardiology.

## 2023-07-14 NOTE — Assessment & Plan Note (Signed)
Cholesterol levels are stable.  Continue statin, tolerating well.

## 2023-07-14 NOTE — Assessment & Plan Note (Signed)
HgbA1c is normal, will recheck today.

## 2023-07-16 ENCOUNTER — Other Ambulatory Visit: Payer: Self-pay | Admitting: Nurse Practitioner

## 2023-07-16 DIAGNOSIS — I700 Atherosclerosis of aorta: Secondary | ICD-10-CM

## 2023-07-25 ENCOUNTER — Other Ambulatory Visit: Payer: Self-pay | Admitting: Nurse Practitioner

## 2023-08-01 ENCOUNTER — Other Ambulatory Visit: Payer: Self-pay | Admitting: Nurse Practitioner

## 2023-08-01 DIAGNOSIS — M79641 Pain in right hand: Secondary | ICD-10-CM

## 2023-08-01 DIAGNOSIS — G8929 Other chronic pain: Secondary | ICD-10-CM

## 2023-08-15 ENCOUNTER — Ambulatory Visit
Admission: RE | Admit: 2023-08-15 | Discharge: 2023-08-15 | Disposition: A | Discharge status: Home / Self Care | Source: Ambulatory Visit | Attending: Nurse Practitioner | Admitting: Nurse Practitioner

## 2023-08-15 DIAGNOSIS — Z1231 Encounter for screening mammogram for malignant neoplasm of breast: Secondary | ICD-10-CM

## 2023-08-17 ENCOUNTER — Other Ambulatory Visit: Payer: Self-pay | Admitting: Nurse Practitioner

## 2023-08-22 DIAGNOSIS — M1711 Unilateral primary osteoarthritis, right knee: Secondary | ICD-10-CM | POA: Diagnosis not present

## 2023-08-25 ENCOUNTER — Other Ambulatory Visit: Payer: Self-pay | Admitting: Nurse Practitioner

## 2023-08-25 DIAGNOSIS — R7309 Other abnormal glucose: Secondary | ICD-10-CM

## 2023-08-29 DIAGNOSIS — M1711 Unilateral primary osteoarthritis, right knee: Secondary | ICD-10-CM | POA: Diagnosis not present

## 2023-09-02 NOTE — Progress Notes (Unsigned)
 iiiiiiiiiiiiiiiiiiiiiiiiiiiiiiiiiiiiiiiiiiiiiiiiiiiiiiiiiiiiiiiiiiiiiiiiiiiiiiiiiiiiiiiiiiiiiiiiiiiiiiiiiiiiiiiiiiiiiiiiiiiiiiiiiiiiiiiiiiiiiiiiiiiiiiiiiiiiiiiiiiiiiiiiiiiiiiiiiiiiiiiiiiiiiiiiiiiiiiiiiiiiiiiiiiiiiiiiiiiiiiiiiiiiiiiiiiiiiiiiiiiiiiiiiiiiiiiiiiiiiiiiiiiiiiiiiiiiiiiiiiiiiiiiiiiiiiiiiiiiiiiiiiiiiiiiiiiiiiiiiiiiiiiiiiiiiiiiiiiiiiiiiiiiiiiiiiiiiiiiiiiiiiiiiiiiiiiiiiiiiiiiiiiiiiiiiiiiiiiiiiiiiiiiiiiiiiiiiiiiiiiiiiiiiiiiiiiiiiiiiiiiiiiiiiiiiiiiiiiiiiiiiiiiiiiiiiiiiiiiiiiiiiiiiiiiiiiiiiiiiiiiiiiiiiiiiiiiiiiiiiiiiiiiiiiiiiiiiiiiiiiiiiiiiiiiiiiiiiiiiiiiiiiiiiiiiiiiiiiiiiiiiiiiiiiiiiiiiiiiiiiiiiiiiiiiiiiiiiiiiiiiiiiiiiiiiiiiiiiiiiiiiiiiiiiiiiiiiiiiiiiiiiiiiiiiiiiiiiiiiiiiiiiiiiiiiiiiiiiiiiiiiiiiiiiiiiiiiiiiiiiiiiiiiiiiiiiiiiiiiiiiiiiiiiiiiiiiiiiiiiiiiiiiiiiiiiiiiiiiiiiiiiiiiiiiiiiiiiiiiiiiiiiiiiiiiiiiiiiiiiiiiiiiiiiiiiiiiiiiiiiiiiiiiiiiiiiiiiiiiiiiiiiiiiiiiiiiiiiiiiiiiiiiiiiiiiiiiiiiiiiiiiiiiiiiiiiiiiiiiiiiiiiiiiiiiiiiiiiiiiiiiiiiiiiiiiiiiiiiiiiiiiiiiiiiiiiiiiiiiiiiiiiiiiiiiiiiiiiiiiiiiiiiiiiiiiiiiiiiiiiiiiiiiiiiiiiiiiiiiiiiiiiiiiiiiiiiiiiiiiiiiiiiiiiiiiiiiiiiiiiiiiiiiiiiiiiiiiiiiiiiiiiiiiiiiiiiiiiiiiiiiiiiiiiiiiiiiiiiiiiiiiiiiiiiiiiiiiiiiiiiiiiiiiiiiiiiiiiiiiiiiiiiiiiiiiiiiiiiiiiiiiiiiiiiiiiiiiiiiiiiiiiiiiiiiiiiiiiiiiiiiiiiiiiiiiiiiiiiiiiiiiiiiiiiiiiiiiiiiiiiiiiiiiiiiiiiiiiiiiiiiiiiiiiiiiiiiiiiiiiiiiiiiiiiiiiiiiiiiiiiiiiiiiiiiiiiiiiiiiiiiiiiiiiiiiiiiiiiiiiiiiiiiiiiiiiiiiiiiiiiiiiiiiiiiiiiiiiiiiiiiiiiiiiiiiiiiiiiiiiiiiiiiiiiiiiiiiiiiiiiiiiiiiiiiiiiiiiiiiiiiiiiiiiiiiiiiiiiiiiiiiiiiiiiiiiiiiiiiiiiiiiiiiiiiiiiiiiiiiiiiiiiiiiiiiiiiiiiiiiiiiiiiiiiiiiiiiiiiiiiiiiiiiiiiiiiiiiiiiiiiiiiiiiiiiiiiiiiiiiiiiiiiiiiiiiiiiiiiiiiiiiiiiiiiiiiiiiiiiiiiiiiiiiiiiiiiiiiiiiiiiiiiiiiiiiiiiiiiiiiiiiiiiiiiiiiiiiiiiiiiiiiiiiiiiiiiiiiiiiiiiiiiiiiiiiiiiiiiiiiiiiiiiiiiiiiiiiiiiiiiiiiiiiiiiiiiiiiiiiiiiiiiiiiiiiiiiiiiiiiiiiiiiiiiiiiiiiiiiiiiiiiiiiiiiiiiiiiiiiiiiiiiiiiiiiiiiiiiiiiiiiiiiiiiiiiiiiiiiiiiiiiiiiiiiiiiiiiiiiiiiiiiiiiiiiiiiiiiiiiiiiiii uganda

## 2023-09-05 DIAGNOSIS — M1711 Unilateral primary osteoarthritis, right knee: Secondary | ICD-10-CM | POA: Diagnosis not present

## 2023-09-10 ENCOUNTER — Other Ambulatory Visit: Payer: Self-pay | Admitting: Cardiovascular Disease

## 2023-09-10 DIAGNOSIS — I480 Paroxysmal atrial fibrillation: Secondary | ICD-10-CM

## 2023-09-11 NOTE — Telephone Encounter (Signed)
 Eliquis  5mg  refill request received. Patient is 58 years old, weight-117.2kg, Crea-0.68 on 03/14/23, Diagnosis-Afib, and last seen by Rosaline Bane on 04/25/23. Dose is appropriate based on dosing criteria. Will send in refill to requested pharmacy.

## 2023-09-26 ENCOUNTER — Ambulatory Visit (INDEPENDENT_AMBULATORY_CARE_PROVIDER_SITE_OTHER)

## 2023-09-26 ENCOUNTER — Ambulatory Visit: Payer: Self-pay | Admitting: Cardiology

## 2023-09-26 DIAGNOSIS — I421 Obstructive hypertrophic cardiomyopathy: Secondary | ICD-10-CM | POA: Diagnosis not present

## 2023-09-26 LAB — CUP PACEART REMOTE DEVICE CHECK
Battery Remaining Longevity: 126 mo
Battery Remaining Percentage: 100 %
Brady Statistic RA Percent Paced: 6 %
Brady Statistic RV Percent Paced: 2 %
Date Time Interrogation Session: 20250918005400
HighPow Impedance: 62 Ohm
Implantable Lead Connection Status: 753985
Implantable Lead Connection Status: 753985
Implantable Lead Implant Date: 20220318
Implantable Lead Implant Date: 20220318
Implantable Lead Location: 753859
Implantable Lead Location: 753860
Implantable Lead Model: 673
Implantable Lead Model: 7841
Implantable Lead Serial Number: 1122351
Implantable Lead Serial Number: 158410
Implantable Pulse Generator Implant Date: 20220318
Lead Channel Impedance Value: 383 Ohm
Lead Channel Impedance Value: 671 Ohm
Lead Channel Setting Pacing Amplitude: 2 V
Lead Channel Setting Pacing Amplitude: 2.5 V
Lead Channel Setting Pacing Pulse Width: 0.4 ms
Lead Channel Setting Sensing Sensitivity: 0.5 mV
Pulse Gen Serial Number: 593724
Zone Setting Status: 755011

## 2023-10-01 NOTE — Progress Notes (Signed)
Remote ICD Transmission.

## 2023-10-03 DIAGNOSIS — M1711 Unilateral primary osteoarthritis, right knee: Secondary | ICD-10-CM | POA: Diagnosis not present

## 2023-10-03 DIAGNOSIS — M25561 Pain in right knee: Secondary | ICD-10-CM | POA: Diagnosis not present

## 2023-10-14 ENCOUNTER — Other Ambulatory Visit: Payer: Self-pay | Admitting: Nurse Practitioner

## 2023-10-14 DIAGNOSIS — I421 Obstructive hypertrophic cardiomyopathy: Secondary | ICD-10-CM

## 2023-10-21 ENCOUNTER — Other Ambulatory Visit: Payer: Self-pay | Admitting: Nurse Practitioner

## 2023-10-23 ENCOUNTER — Other Ambulatory Visit: Payer: Self-pay | Admitting: Family

## 2023-10-23 ENCOUNTER — Other Ambulatory Visit: Payer: Self-pay | Admitting: Nurse Practitioner

## 2023-10-23 DIAGNOSIS — I10 Essential (primary) hypertension: Secondary | ICD-10-CM

## 2023-10-23 DIAGNOSIS — I421 Obstructive hypertrophic cardiomyopathy: Secondary | ICD-10-CM

## 2023-10-28 ENCOUNTER — Other Ambulatory Visit: Payer: Self-pay | Admitting: Nurse Practitioner

## 2023-10-28 DIAGNOSIS — M79642 Pain in left hand: Secondary | ICD-10-CM

## 2023-10-28 DIAGNOSIS — M545 Low back pain, unspecified: Secondary | ICD-10-CM

## 2023-11-07 ENCOUNTER — Ambulatory Visit (INDEPENDENT_AMBULATORY_CARE_PROVIDER_SITE_OTHER): Admitting: Nurse Practitioner

## 2023-11-07 ENCOUNTER — Encounter: Payer: Self-pay | Admitting: Nurse Practitioner

## 2023-11-07 VITALS — BP 126/74 | HR 74 | Temp 98.5°F | Ht 66.0 in | Wt 262.0 lb

## 2023-11-07 DIAGNOSIS — Z79899 Other long term (current) drug therapy: Secondary | ICD-10-CM

## 2023-11-07 DIAGNOSIS — Z6841 Body Mass Index (BMI) 40.0 and over, adult: Secondary | ICD-10-CM

## 2023-11-07 DIAGNOSIS — Z95811 Presence of heart assist device: Secondary | ICD-10-CM

## 2023-11-07 DIAGNOSIS — I700 Atherosclerosis of aorta: Secondary | ICD-10-CM

## 2023-11-07 DIAGNOSIS — R7303 Prediabetes: Secondary | ICD-10-CM | POA: Diagnosis not present

## 2023-11-07 DIAGNOSIS — I10 Essential (primary) hypertension: Secondary | ICD-10-CM

## 2023-11-07 DIAGNOSIS — E782 Mixed hyperlipidemia: Secondary | ICD-10-CM | POA: Diagnosis not present

## 2023-11-07 DIAGNOSIS — Z23 Encounter for immunization: Secondary | ICD-10-CM

## 2023-11-07 DIAGNOSIS — I421 Obstructive hypertrophic cardiomyopathy: Secondary | ICD-10-CM | POA: Diagnosis not present

## 2023-11-07 DIAGNOSIS — Z139 Encounter for screening, unspecified: Secondary | ICD-10-CM | POA: Diagnosis not present

## 2023-11-07 DIAGNOSIS — Z862 Personal history of diseases of the blood and blood-forming organs and certain disorders involving the immune mechanism: Secondary | ICD-10-CM | POA: Diagnosis not present

## 2023-11-07 DIAGNOSIS — J441 Chronic obstructive pulmonary disease with (acute) exacerbation: Secondary | ICD-10-CM

## 2023-11-07 DIAGNOSIS — Z0000 Encounter for general adult medical examination without abnormal findings: Secondary | ICD-10-CM | POA: Diagnosis not present

## 2023-11-07 DIAGNOSIS — E1142 Type 2 diabetes mellitus with diabetic polyneuropathy: Secondary | ICD-10-CM | POA: Insufficient documentation

## 2023-11-07 DIAGNOSIS — E66813 Obesity, class 3: Secondary | ICD-10-CM | POA: Diagnosis not present

## 2023-11-07 LAB — POCT URINALYSIS DIP (CLINITEK)
Bilirubin, UA: NEGATIVE
Blood, UA: NEGATIVE
Glucose, UA: 500 mg/dL — AB
Ketones, POC UA: NEGATIVE mg/dL
Leukocytes, UA: NEGATIVE
Nitrite, UA: NEGATIVE
POC PROTEIN,UA: NEGATIVE
Spec Grav, UA: 1.015 (ref 1.010–1.025)
Urobilinogen, UA: 0.2 U/dL
pH, UA: 6 (ref 5.0–8.0)

## 2023-11-07 NOTE — Progress Notes (Signed)
 Subjective:    Alexandra Bell is a 58 y.o. female who presents for a Welcome to Medicare exam.  Discussed the use of AI scribe software for clinical note transcription with the patient, who gave verbal consent to proceed.  History of Present Illness Alexandra Bell is a 58 year old female who presents for her Welcome to [facility] appointment.  She aims to lose weight by the end of the year.  She uses a cane for mobility but has not experienced any falls. Her mood is stable, and her memory score is good. She wears glasses for vision correction.  She has recently seen her orthopedic doctor and started receiving gel shots for her arthritis, which have improved her symptoms. She is currently using Symbicort  for her respiratory condition, as her insurance did not approve Dulera .  She experiences 'soft' stools regularly but does not describe them as diarrhea. She was hospitalized two years ago for bleeding ulcers, which were treated with clamps. She takes multivitamins and iron pills but does not use fiber supplements like Metamucil.  She received her flu shot this year after missing it last year and is pleased she did not get sick despite missing it previously. No trouble swallowing, shortness of breath, chest pain, swelling in her feet or ankles, and constipation. She reports regular bowel movements and seldom needs a stool softener.          Objective:    Today's Vitals   11/07/23 1024  BP: 126/74  Pulse: 74  Temp: 98.5 F (36.9 C)  TempSrc: Oral  Weight: 262 lb (118.8 kg)  Height: 5' 6 (1.676 m)  PainSc: 5   PainLoc: Knee  Body mass index is 42.29 kg/m.  Medications Outpatient Encounter Medications as of 11/07/2023  Medication Sig   albuterol  (PROVENTIL ) (2.5 MG/3ML) 0.083% nebulizer solution Inhale 3 mL 3 times a day by nebulization route as needed for 30 days.   albuterol  (VENTOLIN  HFA) 108 (90 Base) MCG/ACT inhaler Inhale 2 puffs into the lungs every 6 (six) hours as  needed. For asthma   amLODipine  (NORVASC ) 10 MG tablet Take 1 tablet by mouth once daily   apixaban  (ELIQUIS ) 5 MG TABS tablet Take 1 tablet by mouth twice daily   ascorbic acid (VITAMIN C) 250 MG CHEW Chew 500 mg by mouth daily.   atorvastatin  (LIPITOR) 20 MG tablet Take 1 tablet by mouth once daily   budesonide -formoterol  (SYMBICORT ) 80-4.5 MCG/ACT inhaler Inhale 2 puffs into the lungs 2 (two) times daily as needed.   carvedilol  (COREG ) 25 MG tablet Take 1 tablet by mouth twice daily   cyanocobalamin  (VITAMIN B12) 1000 MCG tablet Take 1,000 mcg by mouth daily.   diclofenac  Sodium (VOLTAREN ) 1 % GEL Apply 2 g topically daily as needed (For pain).   ferrous sulfate  325 (65 FE) MG EC tablet Take 1 tablet (325 mg total) by mouth 2 (two) times daily with a meal.   furosemide  (LASIX ) 80 MG tablet Take 1 tablet by mouth once daily   gabapentin  (NEURONTIN ) 300 MG capsule Take 1 capsule by mouth twice daily   guaiFENesin -dextromethorphan (ROBITUSSIN DM) 100-10 MG/5ML syrup Take 5 mLs by mouth every 4 (four) hours as needed for cough.   JARDIANCE  10 MG TABS tablet Take 1 tablet by mouth once daily   Magnesium  250 MG TABS Take 250 mg by mouth daily at 2 PM.   melatonin 5 MG TABS Take 5 mg by mouth daily at 2 PM.   meloxicam  (MOBIC ) 15  MG tablet Take 15 mg by mouth daily as needed.   metFORMIN  (GLUCOPHAGE ) 500 MG tablet TAKE 1 TABLET BY MOUTH TWICE DAILY WITH A MEAL   Multiple Vitamin (MULTIVITAMIN WITH MINERALS) TABS tablet Take 1 tablet by mouth daily.   potassium chloride  SA (KLOR-CON  M) 20 MEQ tablet Take 1 tablet by mouth once daily   sacubitril -valsartan  (ENTRESTO ) 24-26 MG Take 1 tablet by mouth twice daily   [DISCONTINUED] mometasone -formoterol  (DULERA ) 100-5 MCG/ACT AERO Inhale 2 puffs into the lungs 2 (two) times daily.   [DISCONTINUED] pantoprazole  (PROTONIX ) 40 MG tablet Take 1 tablet by mouth twice daily   No facility-administered encounter medications on file as of 11/07/2023.      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   Fatty liver 11/2010   Gastritis    GERD (gastroesophageal reflux disease)    HOCM (hypertrophic obstructive cardiomyopathy) (HCC) 12/10/2019   Hx: UTI (urinary tract infection)    Hypertension    Inguinal hernia    Morbid obesity (HCC) 12/10/2019   NSVT (nonsustained ventricular tachycardia) (HCC) 03/10/2020   Obesity, Class III, BMI 40-49.9 (morbid obesity) (HCC)    Periumbilical hernia    Upper GI bleed 09/11/2020   Past Surgical History:  Procedure Laterality Date   ABDOMINAL HYSTERECTOMY     BIOPSY  09/13/2020   Procedure: BIOPSY;  Surgeon: Charlanne Groom, MD;  Location: [facility] ENDOSCOPY;  Service: Endoscopy;;   BIOPSY  10/05/2020   Procedure: BIOPSY;  Surgeon: Albertus Gordy HERO, MD;  Location: [facility] ENDOSCOPY;  Service: Gastroenterology;;   BRONCHOSCOPY, LYMPH NODE BX  MAY 2011   CHOLECYSTECTOMY  2001   COLONOSCOPY N/A 09/13/2020   Procedure: COLONOSCOPY;  Surgeon: Charlanne Groom, MD;  Location: [facility] ENDOSCOPY;  Service: Endoscopy;  Laterality: N/A;   ERCP/SPHINCTEROTOMY/STONE EXTRACTION  2001   ESOPHAGOGASTRODUODENOSCOPY (EGD) WITH PROPOFOL  N/A 09/13/2020   Procedure: ESOPHAGOGASTRODUODENOSCOPY (EGD) WITH PROPOFOL ;  Surgeon: Charlanne Groom, MD;  Location: [facility] ENDOSCOPY;  Service: Endoscopy;  Laterality: N/A;   ESOPHAGOGASTRODUODENOSCOPY (EGD) WITH PROPOFOL  N/A 10/05/2020   Procedure: ESOPHAGOGASTRODUODENOSCOPY (EGD) WITH PROPOFOL ;  Surgeon: Albertus Gordy HERO, MD;  Location: [facility] ENDOSCOPY;  Service: Gastroenterology;  Laterality: N/A;   FLEXIBLE SIGMOIDOSCOPY  11/29/2010   Procedure:  FLEXIBLE SIGMOIDOSCOPY;  Surgeon: Princella HERO Nida, MD;  Location: [facility] ENDOSCOPY;  Service: Endoscopy;  Laterality: N/A;   HEMOSTASIS CLIP PLACEMENT  10/05/2020   Procedure: HEMOSTASIS CLIP PLACEMENT;  Surgeon: Albertus Gordy HERO, MD;  Location: [facility] ENDOSCOPY;  Service: Gastroenterology;;   ICD IMPLANT N/A 03/25/2020   Procedure: ICD IMPLANT;  Surgeon: Cindie Ole DASEN, MD;  Location: [facility] INVASIVE CV LAB;  Service: Cardiovascular;  Laterality: N/A;    Family History  Problem Relation Age of Onset   Diabetes Mother    Colon cancer Father        ?   Heart attack Father    Diabetes Sister    Social History   Occupational History   Occupation: Tree surgeon, Control And Instrumentation Engineer: UNEMPLOYED  Tobacco Use   Smoking status: Every Day    Current  packs/day: 0.25    Average packs/day: 0.3 packs/day for 30.0 years (7.5 ttl pk-yrs)    Types: Cigarettes   Smokeless tobacco: Never   Tobacco comments:    04/25/2023 Patient smokes about 5 cigarettes daily    she is less than a PPD - states down to 6 cigarettes a day - 10/14, reports smoking 1 PPD for 10 years; she has cut down to 4 a day  Vaping Use   Vaping status: Never Used  Substance and Sexual Activity   Alcohol use: No   Drug use: No   Sexual activity: Not on file    Tobacco Counseling Ready to quit: Not Answered Counseling given: Not Answered Tobacco comments: 04/25/2023 Patient smokes about 5 cigarettes daily she is less than a PPD - states down to 6 cigarettes a day - 10/14, reports smoking 1 PPD for 10 years; she has cut down to 4 a day   Immunizations and Health Maintenance Immunization History  Administered Date(s) Administered   Influenza Whole 10/17/2008   Influenza, Seasonal, Injecte, Preservative Fre 11/07/2023   Influenza,inj,Quad PF,6+ Mos 10/22/2019, 10/13/2020   PFIZER(Purple Top)SARS-COV-2 Vaccination 04/02/2019, 04/27/2019   PNEUMOCOCCAL CONJUGATE-20 03/14/2023   Pfizer(Comirnaty)Fall Seasonal Vaccine 12 years and older  05/17/2022   Pneumococcal Polysaccharide-23 07/04/2009   Tdap 08/10/2021   Zoster Recombinant(Shingrix) 07/28/2020, 11/03/2020   Health Maintenance Due  Topic Date Due   Hepatitis B Vaccines 19-59 Average Risk (1 of 3 - 19+ 3-dose series) Never done   OPHTHALMOLOGY EXAM  01/13/2021   FOOT EXAM  05/17/2023    Activities of Daily Living    11/07/2023   10:30 AM  In your present state of health, do you have any difficulty performing the following activities:  Hearing? 0  Vision? 0  Difficulty concentrating or making decisions? 0  Walking or climbing stairs? 1  Dressing or bathing? 1  Doing errands, shopping? 0   Review of Systems  Constitutional: Negative.   HENT: Negative.    Eyes: Negative.   Respiratory: Negative.    Cardiovascular: Negative.   Gastrointestinal: Negative.   Genitourinary: Negative.   Musculoskeletal: Negative.   Skin: Negative.   Neurological: Negative.   Endo/Heme/Allergies: Negative.   Psychiatric/Behavioral: Negative.       Physical Exam   Physical Exam Vitals and nursing note reviewed.  Constitutional:      General: She is not in acute distress.    Appearance: Normal appearance. She is well-developed. She is obese.  HENT:     Head: Normocephalic and atraumatic.     Right Ear: Hearing, tympanic membrane, ear canal and external ear normal. There is no impacted cerumen.     Left Ear: Hearing, tympanic membrane, ear canal and external ear normal. There is no impacted cerumen.     Nose: Nose normal.     Mouth/Throat:     Mouth: Mucous membranes are moist.  Eyes:     General: Lids are normal.     Extraocular Movements: Extraocular movements intact.     Conjunctiva/sclera: Conjunctivae normal.     Pupils: Pupils are equal, round, and reactive to light.     Funduscopic exam:    Right eye: No papilledema.        Left eye: No papilledema.  Neck:     Thyroid : No thyroid  mass.     Vascular: No carotid bruit.  Cardiovascular:     Rate and  Rhythm: Normal rate and regular rhythm.     Pulses: Normal pulses.  Heart sounds: Normal heart sounds. No murmur heard. Pulmonary:     Effort: Pulmonary effort is normal. No respiratory distress.     Breath sounds: Normal breath sounds. No wheezing.  Chest:     Chest wall: No mass.  Breasts:    Tanner Score is 5.     Right: Normal. No mass or tenderness.     Left: Normal. No mass or tenderness.  Abdominal:     General: Abdomen is flat. Bowel sounds are normal. There is no distension.     Palpations: Abdomen is soft.     Tenderness: There is no abdominal tenderness.  Musculoskeletal:        General: No swelling. Normal range of motion.     Cervical back: Full passive range of motion without pain, normal range of motion and neck supple. No tenderness.     Right lower leg: No edema.     Left lower leg: No edema.  Lymphadenopathy:     Upper Body:     Right upper body: No supraclavicular, axillary or pectoral adenopathy.     Left upper body: No supraclavicular, axillary or pectoral adenopathy.  Skin:    General: Skin is warm and dry.     Capillary Refill: Capillary refill takes less than 2 seconds.  Neurological:     General: No focal deficit present.     Mental Status: She is alert and oriented to person, place, and time.     Cranial Nerves: No cranial nerve deficit.     Sensory: No sensory deficit.  Psychiatric:        Mood and Affect: Mood normal.        Behavior: Behavior normal.        Thought Content: Thought content normal.        Judgment: Judgment normal.      Advanced Directives:   EKG:  unchanged from previous tracings      Assessment:    This is a routine wellness examination for this patient .   Vision/Hearing screen Hearing Screening   500Hz  1000Hz  2000Hz  4000Hz   Right ear 20 40 25 40  Left ear 20 20 20 20    Vision Screening   Right eye Left eye Both eyes  Without correction     With correction 20/200 20/20 20/20     Goals       Patient  Stated (pt-stated)      Lose a little weight before end of year.         Depression Screen    11/07/2023   10:29 AM 07/04/2023   11:06 AM 03/14/2023   11:56 AM 08/23/2022   11:26 AM  PHQ 2/9 Scores  PHQ - 2 Score 0 0 0 2  PHQ- 9 Score  0  0  5      Data saved with a previous flowsheet row definition     Fall Risk    07/04/2023   11:06 AM  Fall Risk   Falls in the past year? 0  Number falls in past yr: 0  Injury with Fall? 0  Risk for fall due to : No Fall Risks  Follow up Falls evaluation completed    Cognitive Function:        11/07/2023   10:31 AM  6CIT Screen  What Year? 0 points  What month? 0 points  What time? 0 points  Count back from 20 0 points  Months in reverse 0 points  Repeat phrase 2 points  Total Score 2 points    Patient Care Team: Georgina Speaks, FNP as PCP - General (General Practice) Raford Riggs, MD as PCP - Cardiology (Cardiology) Cindie Ole DASEN, MD as PCP - Electrophysiology (Cardiology) [facility] (Orthopedic Surgery) Delene Thersia PARAS as Social Worker     Plan:  Pt's annual wellness exam was performed and geriatric assessment reviewed.  Pt has no new identiafble wellness concerns at this time.  WIll obtain routine labs.  Will obtain UA and micro.  Behavior modifications discussed and diet history reviewed. Pt will continue to exercise regularly and modify diet, with low GI, plant based foods and decrease food intake of processed foods.  Recommend intake of daily multivitamin, Vitamin D, and calcium . Recommend mammogram (up to date) and colonoscopy (up to date) for preventive screenings, as well as recommend immunizations that include influenza (up to date) and TDAP   I have personally reviewed and noted the following in the patient's chart:   Medical and social history Use of alcohol, tobacco or illicit drugs  Current medications and supplements including opioid prescriptions. Patient is  not currently taking opioid prescriptions. Functional ability and status Nutritional status Physical activity Advanced directives List of other physicians Hospitalizations, surgeries, and ER visits in previous 12 months Vitals Screenings to include cognitive, depression, and falls Referrals and appointments  In addition, I have reviewed and discussed with patient certain preventive protocols, quality metrics, and best practice recommendations. A written personalized care plan for preventive services as well as general preventive health recommendations were provided to patient.   Speaks Georgina, FNP 11/07/2023

## 2023-11-08 LAB — HEPATITIS B SURFACE ANTIBODY,QUALITATIVE: Hep B Surface Ab, Qual: NONREACTIVE

## 2023-11-08 LAB — IRON,TIBC AND FERRITIN PANEL
Ferritin: 46 ng/mL (ref 15–150)
Iron Saturation: 18 % (ref 15–55)
Iron: 62 ug/dL (ref 27–159)
Total Iron Binding Capacity: 339 ug/dL (ref 250–450)
UIBC: 277 ug/dL (ref 131–425)

## 2023-11-08 LAB — CBC WITH DIFFERENTIAL/PLATELET
Basophils Absolute: 0.1 x10E3/uL (ref 0.0–0.2)
Basos: 1 %
EOS (ABSOLUTE): 0.1 x10E3/uL (ref 0.0–0.4)
Eos: 1 %
Hematocrit: 38.2 % (ref 34.0–46.6)
Hemoglobin: 12.1 g/dL (ref 11.1–15.9)
Immature Grans (Abs): 0 x10E3/uL (ref 0.0–0.1)
Immature Granulocytes: 0 %
Lymphocytes Absolute: 1.9 x10E3/uL (ref 0.7–3.1)
Lymphs: 23 %
MCH: 30.5 pg (ref 26.6–33.0)
MCHC: 31.7 g/dL (ref 31.5–35.7)
MCV: 96 fL (ref 79–97)
Monocytes Absolute: 0.7 x10E3/uL (ref 0.1–0.9)
Monocytes: 8 %
Neutrophils Absolute: 5.7 x10E3/uL (ref 1.4–7.0)
Neutrophils: 67 %
Platelets: 260 x10E3/uL (ref 150–450)
RBC: 3.97 x10E6/uL (ref 3.77–5.28)
RDW: 13.1 % (ref 11.7–15.4)
WBC: 8.4 x10E3/uL (ref 3.4–10.8)

## 2023-11-08 LAB — CMP14+EGFR
ALT: 7 IU/L (ref 0–32)
AST: 13 IU/L (ref 0–40)
Albumin: 4.2 g/dL (ref 3.8–4.9)
Alkaline Phosphatase: 65 IU/L (ref 49–135)
BUN/Creatinine Ratio: 15 (ref 9–23)
BUN: 10 mg/dL (ref 6–24)
Bilirubin Total: 0.2 mg/dL (ref 0.0–1.2)
CO2: 26 mmol/L (ref 20–29)
Calcium: 10.4 mg/dL — ABNORMAL HIGH (ref 8.7–10.2)
Chloride: 103 mmol/L (ref 96–106)
Creatinine, Ser: 0.66 mg/dL (ref 0.57–1.00)
Globulin, Total: 2.5 g/dL (ref 1.5–4.5)
Glucose: 86 mg/dL (ref 70–99)
Potassium: 4.9 mmol/L (ref 3.5–5.2)
Sodium: 140 mmol/L (ref 134–144)
Total Protein: 6.7 g/dL (ref 6.0–8.5)
eGFR: 101 mL/min/1.73 (ref >59)

## 2023-11-08 LAB — MICROALBUMIN / CREATININE URINE RATIO
Creatinine, Urine: 75.2 mg/dL
Microalb/Creat Ratio: 15 mg/g{creat} (ref 0–29)
Microalbumin, Urine: 11.3 ug/mL

## 2023-11-08 LAB — LIPID PANEL
Chol/HDL Ratio: 1.9 ratio (ref 0.0–4.4)
Cholesterol, Total: 128 mg/dL (ref 100–199)
HDL: 67 mg/dL (ref >39)
LDL Chol Calc (NIH): 42 mg/dL (ref 0–99)
Triglycerides: 104 mg/dL (ref 0–149)
VLDL Cholesterol Cal: 19 mg/dL (ref 5–40)

## 2023-11-08 LAB — HEMOGLOBIN A1C
Est. average glucose Bld gHb Est-mCnc: 108 mg/dL
Hgb A1c MFr Bld: 5.4 % (ref 4.8–5.6)

## 2023-11-13 ENCOUNTER — Telehealth: Payer: Self-pay

## 2023-11-13 NOTE — Telephone Encounter (Signed)
 Left message about the PREP Program and asked her to return my call.

## 2023-11-14 ENCOUNTER — Other Ambulatory Visit: Payer: Self-pay | Admitting: Nurse Practitioner

## 2023-11-17 ENCOUNTER — Ambulatory Visit: Payer: Self-pay | Admitting: Nurse Practitioner

## 2023-11-17 NOTE — Patient Instructions (Signed)
 Alexandra Bell,  Thank you for taking the time for your Medicare Wellness Visit. I appreciate your continued commitment to your health goals. Please review the care plan we discussed, and feel free to reach out if I can assist you further.  Please note that Annual Wellness Visits do not include a physical exam. Some assessments may be limited, especially if the visit was conducted virtually. If needed, we may recommend an in-person follow-up with your provider.  Ongoing Care Seeing your primary care provider every 3 to 6 months helps us  monitor your health and provide consistent, personalized care.   Referrals If a referral was made during today's visit and you haven't received any updates within two weeks, please contact the referred provider directly to check on the status.  Recommended Screenings:  Health Maintenance  Topic Date Due   Hepatitis B Vaccine (1 of 3 - 19+ 3-dose series) Never done   Eye exam for diabetics  01/13/2021   Complete foot exam   05/17/2023   COVID-19 Vaccine (4 - 2025-26 season) 11/23/2023*   Hemoglobin A1C  05/07/2024   Yearly kidney function blood test for diabetes  11/06/2024   Yearly kidney health urinalysis for diabetes  11/06/2024   Medicare Annual Wellness Visit  11/06/2024   Breast Cancer Screening  08/14/2025   Pap with HPV screening  08/22/2025   Colon Cancer Screening  09/14/2030   DTaP/Tdap/Td vaccine (2 - Td or Tdap) 08/11/2031   Pneumococcal Vaccine for age over 49  Completed   Flu Shot  Completed   Hepatitis C Screening  Completed   HIV Screening  Completed   Zoster (Shingles) Vaccine  Completed   HPV Vaccine  Aged Out   Meningitis B Vaccine  Aged Out  *Topic was postponed. The date shown is not the original due date.       12/12/2021   11:39 PM  Advanced Directives  Does Patient Have a Medical Advance Directive? No  Would patient like information on creating a medical advance directive? No - Patient declined    Vision: Annual vision  screenings are recommended for early detection of glaucoma, cataracts, and diabetic retinopathy. These exams can also reveal signs of chronic conditions such as diabetes and high blood pressure.  Dental: Annual dental screenings help detect early signs of oral cancer, gum disease, and other conditions linked to overall health, including heart disease and diabetes.  Please see the attached documents for additional preventive care recommendations.

## 2023-11-21 ENCOUNTER — Other Ambulatory Visit: Payer: Self-pay | Admitting: Nurse Practitioner

## 2023-11-21 DIAGNOSIS — R7309 Other abnormal glucose: Secondary | ICD-10-CM

## 2023-11-28 DIAGNOSIS — M25811 Other specified joint disorders, right shoulder: Secondary | ICD-10-CM | POA: Diagnosis not present

## 2023-11-28 DIAGNOSIS — M1711 Unilateral primary osteoarthritis, right knee: Secondary | ICD-10-CM | POA: Diagnosis not present

## 2023-12-02 ENCOUNTER — Telehealth: Payer: Self-pay

## 2023-12-02 NOTE — Telephone Encounter (Signed)
 Called [facility]:XWRU program referral; left voicemail requesting return call.

## 2023-12-11 ENCOUNTER — Ambulatory Visit: Payer: Self-pay

## 2023-12-17 NOTE — Progress Notes (Signed)
 Alexandra Bell                                          MRN: [id number]   12/17/2023   The [facility] Quality Team Specialist reviewed this patient medical record for the purposes of chart review for care gap closure. The following were reviewed: abstraction for care gap closure-controlling blood pressure.    VBCI Quality Team

## 2023-12-26 ENCOUNTER — Ambulatory Visit

## 2023-12-26 DIAGNOSIS — I421 Obstructive hypertrophic cardiomyopathy: Secondary | ICD-10-CM | POA: Diagnosis not present

## 2023-12-27 LAB — CUP PACEART REMOTE DEVICE CHECK
Battery Remaining Longevity: 126 mo
Battery Remaining Percentage: 100 %
Brady Statistic RA Percent Paced: 7 %
Brady Statistic RV Percent Paced: 2 %
Date Time Interrogation Session: 20251218005100
HighPow Impedance: 72 Ohm
Implantable Lead Connection Status: 753985
Implantable Lead Connection Status: 753985
Implantable Lead Implant Date: 20220318
Implantable Lead Implant Date: 20220318
Implantable Lead Location: 753859
Implantable Lead Location: 753860
Implantable Lead Model: 673
Implantable Lead Model: 7841
Implantable Lead Serial Number: 1122351
Implantable Lead Serial Number: 158410
Implantable Pulse Generator Implant Date: 20220318
Lead Channel Impedance Value: 373 Ohm
Lead Channel Impedance Value: 626 Ohm
Lead Channel Setting Pacing Amplitude: 2 V
Lead Channel Setting Pacing Amplitude: 2.5 V
Lead Channel Setting Pacing Pulse Width: 0.4 ms
Lead Channel Setting Sensing Sensitivity: 0.5 mV
Pulse Gen Serial Number: 593724
Zone Setting Status: 755011

## 2023-12-29 NOTE — Progress Notes (Signed)
 Remote ICD Transmission

## 2024-01-08 ENCOUNTER — Ambulatory Visit: Payer: Self-pay | Admitting: Cardiovascular Disease

## 2024-01-09 ENCOUNTER — Other Ambulatory Visit: Payer: Self-pay | Admitting: Nurse Practitioner

## 2024-01-09 DIAGNOSIS — I421 Obstructive hypertrophic cardiomyopathy: Secondary | ICD-10-CM

## 2024-01-21 ENCOUNTER — Other Ambulatory Visit: Payer: Self-pay | Admitting: Cardiovascular Disease

## 2024-01-27 ENCOUNTER — Other Ambulatory Visit: Payer: Self-pay | Admitting: Family

## 2024-01-27 DIAGNOSIS — I10 Essential (primary) hypertension: Secondary | ICD-10-CM

## 2024-01-27 DIAGNOSIS — I421 Obstructive hypertrophic cardiomyopathy: Secondary | ICD-10-CM

## 2024-02-08 ENCOUNTER — Other Ambulatory Visit: Payer: Self-pay | Admitting: Nurse Practitioner

## 2024-02-10 ENCOUNTER — Other Ambulatory Visit: Payer: Self-pay | Admitting: Nurse Practitioner

## 2024-02-10 DIAGNOSIS — G8929 Other chronic pain: Secondary | ICD-10-CM

## 2024-02-10 DIAGNOSIS — M79641 Pain in right hand: Secondary | ICD-10-CM

## 2024-03-26 ENCOUNTER — Encounter

## 2024-04-02 ENCOUNTER — Ambulatory Visit: Admitting: Student

## 2024-04-09 ENCOUNTER — Ambulatory Visit (HOSPITAL_BASED_OUTPATIENT_CLINIC_OR_DEPARTMENT_OTHER): Admitting: Nurse Practitioner

## 2024-05-07 ENCOUNTER — Ambulatory Visit: Payer: Self-pay | Admitting: Nurse Practitioner

## 2024-06-25 ENCOUNTER — Encounter

## 2024-09-24 ENCOUNTER — Encounter

## 2024-11-09 ENCOUNTER — Encounter: Payer: Self-pay | Admitting: Nurse Practitioner

## 2024-11-12 ENCOUNTER — Encounter: Payer: Self-pay | Admitting: Nurse Practitioner

## 2024-12-16 ENCOUNTER — Ambulatory Visit
